# Patient Record
Sex: Male | Born: 1935 | Race: White | Hispanic: No | Marital: Married | Smoking: Former smoker
Health system: Southern US, Community
[De-identification: ages and names within clinical notes are randomized; demographics above are authoritative.]

## PROBLEM LIST (undated history)

## (undated) DIAGNOSIS — I4891 Unspecified atrial fibrillation: Secondary | ICD-10-CM

## (undated) DIAGNOSIS — J189 Pneumonia, unspecified organism: Secondary | ICD-10-CM

## (undated) DIAGNOSIS — I10 Essential (primary) hypertension: Secondary | ICD-10-CM

## (undated) DIAGNOSIS — M1990 Unspecified osteoarthritis, unspecified site: Secondary | ICD-10-CM

## (undated) DIAGNOSIS — E7800 Pure hypercholesterolemia, unspecified: Secondary | ICD-10-CM

## (undated) DIAGNOSIS — R1310 Dysphagia, unspecified: Secondary | ICD-10-CM

## (undated) DIAGNOSIS — I639 Cerebral infarction, unspecified: Secondary | ICD-10-CM

## (undated) DIAGNOSIS — M810 Age-related osteoporosis without current pathological fracture: Secondary | ICD-10-CM

## (undated) DIAGNOSIS — K219 Gastro-esophageal reflux disease without esophagitis: Secondary | ICD-10-CM

## (undated) HISTORY — DX: Cerebral infarction, unspecified: I63.9

---
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
## 1969-05-11 DIAGNOSIS — J189 Pneumonia, unspecified organism: Secondary | ICD-10-CM

## 1969-05-11 DIAGNOSIS — J984 Other disorders of lung: Secondary | ICD-10-CM

## 1969-05-11 HISTORY — DX: Pneumonia, unspecified organism: J18.9

## 1969-05-11 HISTORY — DX: Other disorders of lung: J98.4

## 1999-03-06 ENCOUNTER — Encounter: Admission: RE | Admit: 1999-03-06 | Discharge: 1999-03-06 | Payer: Self-pay | Admitting: Internal Medicine

## 1999-03-06 ENCOUNTER — Encounter: Payer: Self-pay | Admitting: Internal Medicine

## 2000-10-12 ENCOUNTER — Encounter: Payer: Self-pay | Admitting: Internal Medicine

## 2000-10-12 ENCOUNTER — Encounter: Admission: RE | Admit: 2000-10-12 | Discharge: 2000-10-12 | Payer: Self-pay | Admitting: Internal Medicine

## 2012-01-10 DIAGNOSIS — I639 Cerebral infarction, unspecified: Secondary | ICD-10-CM

## 2012-01-10 HISTORY — DX: Cerebral infarction, unspecified: I63.9

## 2012-01-30 ENCOUNTER — Emergency Department (HOSPITAL_COMMUNITY): Payer: Medicare Other

## 2012-01-30 ENCOUNTER — Encounter (HOSPITAL_COMMUNITY): Payer: Self-pay | Admitting: *Deleted

## 2012-01-30 ENCOUNTER — Inpatient Hospital Stay (HOSPITAL_COMMUNITY): Payer: Medicare Other

## 2012-01-30 ENCOUNTER — Inpatient Hospital Stay (HOSPITAL_COMMUNITY)
Admission: EM | Admit: 2012-01-30 | Discharge: 2012-02-02 | DRG: 065 | Disposition: A | Discharge status: Home Health | Payer: Medicare Other | Attending: Internal Medicine | Admitting: Internal Medicine

## 2012-01-30 DIAGNOSIS — Z79899 Other long term (current) drug therapy: Secondary | ICD-10-CM

## 2012-01-30 DIAGNOSIS — R471 Dysarthria and anarthria: Secondary | ICD-10-CM | POA: Diagnosis present

## 2012-01-30 DIAGNOSIS — W19XXXA Unspecified fall, initial encounter: Secondary | ICD-10-CM

## 2012-01-30 DIAGNOSIS — K219 Gastro-esophageal reflux disease without esophagitis: Secondary | ICD-10-CM | POA: Diagnosis present

## 2012-01-30 DIAGNOSIS — M810 Age-related osteoporosis without current pathological fracture: Secondary | ICD-10-CM | POA: Diagnosis present

## 2012-01-30 DIAGNOSIS — I639 Cerebral infarction, unspecified: Secondary | ICD-10-CM | POA: Diagnosis present

## 2012-01-30 DIAGNOSIS — I10 Essential (primary) hypertension: Secondary | ICD-10-CM

## 2012-01-30 DIAGNOSIS — E871 Hypo-osmolality and hyponatremia: Secondary | ICD-10-CM | POA: Diagnosis present

## 2012-01-30 DIAGNOSIS — I4729 Other ventricular tachycardia: Secondary | ICD-10-CM | POA: Diagnosis not present

## 2012-01-30 DIAGNOSIS — I472 Ventricular tachycardia, unspecified: Secondary | ICD-10-CM | POA: Diagnosis not present

## 2012-01-30 DIAGNOSIS — I6330 Cerebral infarction due to thrombosis of unspecified cerebral artery: Principal | ICD-10-CM | POA: Diagnosis present

## 2012-01-30 DIAGNOSIS — R4789 Other speech disturbances: Secondary | ICD-10-CM

## 2012-01-30 DIAGNOSIS — Z87891 Personal history of nicotine dependence: Secondary | ICD-10-CM

## 2012-01-30 DIAGNOSIS — I6350 Cerebral infarction due to unspecified occlusion or stenosis of unspecified cerebral artery: Secondary | ICD-10-CM

## 2012-01-30 DIAGNOSIS — R29898 Other symptoms and signs involving the musculoskeletal system: Secondary | ICD-10-CM | POA: Diagnosis present

## 2012-01-30 DIAGNOSIS — R4781 Slurred speech: Secondary | ICD-10-CM | POA: Diagnosis present

## 2012-01-30 DIAGNOSIS — Z7902 Long term (current) use of antithrombotics/antiplatelets: Secondary | ICD-10-CM

## 2012-01-30 HISTORY — DX: Age-related osteoporosis without current pathological fracture: M81.0

## 2012-01-30 HISTORY — DX: Gastro-esophageal reflux disease without esophagitis: K21.9

## 2012-01-30 HISTORY — DX: Essential (primary) hypertension: I10

## 2012-01-30 LAB — CBC WITH DIFFERENTIAL/PLATELET
Basophils Absolute: 0 10*3/uL (ref 0.0–0.1)
Basophils Relative: 1 % (ref 0–1)
Eosinophils Absolute: 0.1 10*3/uL (ref 0.0–0.7)
Eosinophils Relative: 1 % (ref 0–5)
HCT: 38.8 % — ABNORMAL LOW (ref 39.0–52.0)
Hemoglobin: 13.7 g/dL (ref 13.0–17.0)
Lymphocytes Relative: 28 % (ref 12–46)
Lymphs Abs: 1.7 10*3/uL (ref 0.7–4.0)
MCH: 32.2 pg (ref 26.0–34.0)
MCHC: 35.3 g/dL (ref 30.0–36.0)
MCV: 91.3 fL (ref 78.0–100.0)
Monocytes Absolute: 0.5 10*3/uL (ref 0.1–1.0)
Monocytes Relative: 8 % (ref 3–12)
Neutro Abs: 3.7 10*3/uL (ref 1.7–7.7)
Neutrophils Relative %: 62 % (ref 43–77)
Platelets: 230 10*3/uL (ref 150–400)
RBC: 4.25 MIL/uL (ref 4.22–5.81)
RDW: 12.8 % (ref 11.5–15.5)
WBC: 6 10*3/uL (ref 4.0–10.5)

## 2012-01-30 LAB — CREATININE, SERUM
Creatinine, Ser: 0.85 mg/dL (ref 0.50–1.35)
GFR calc Af Amer: 88 mL/min — ABNORMAL LOW (ref >90)
GFR calc non Af Amer: 76 mL/min — ABNORMAL LOW (ref >90)

## 2012-01-30 LAB — URINALYSIS, ROUTINE W REFLEX MICROSCOPIC
Ketones, ur: NEGATIVE mg/dL
Leukocytes, UA: NEGATIVE
Nitrite: NEGATIVE
Specific Gravity, Urine: 1.012 (ref 1.005–1.030)
pH: 7.5 (ref 5.0–8.0)

## 2012-01-30 LAB — CBC
HCT: 38.9 % — ABNORMAL LOW (ref 39.0–52.0)
Hemoglobin: 14.2 g/dL (ref 13.0–17.0)
MCH: 33.1 pg (ref 26.0–34.0)
MCHC: 36.5 g/dL — ABNORMAL HIGH (ref 30.0–36.0)
MCV: 90.7 fL (ref 78.0–100.0)
Platelets: 227 10*3/uL (ref 150–400)
RBC: 4.29 MIL/uL (ref 4.22–5.81)
RDW: 12.7 % (ref 11.5–15.5)
WBC: 7.1 10*3/uL (ref 4.0–10.5)

## 2012-01-30 LAB — COMPREHENSIVE METABOLIC PANEL
ALT: 13 U/L (ref 0–53)
AST: 23 U/L (ref 0–37)
Albumin: 4.3 g/dL (ref 3.5–5.2)
Alkaline Phosphatase: 44 U/L (ref 39–117)
BUN: 14 mg/dL (ref 6–23)
CO2: 27 mEq/L (ref 19–32)
Calcium: 10 mg/dL (ref 8.4–10.5)
Chloride: 96 mEq/L (ref 96–112)
Creatinine, Ser: 0.85 mg/dL (ref 0.50–1.35)
GFR calc Af Amer: 88 mL/min — ABNORMAL LOW (ref >90)
GFR calc non Af Amer: 76 mL/min — ABNORMAL LOW (ref >90)
Glucose, Bld: 112 mg/dL — ABNORMAL HIGH (ref 70–99)
Potassium: 4 mEq/L (ref 3.5–5.1)
Sodium: 134 mEq/L — ABNORMAL LOW (ref 135–145)
Total Bilirubin: 0.5 mg/dL (ref 0.3–1.2)
Total Protein: 7.2 g/dL (ref 6.0–8.3)

## 2012-01-30 LAB — GLUCOSE, CAPILLARY: Glucose-Capillary: 108 mg/dL — ABNORMAL HIGH (ref 70–99)

## 2012-01-30 LAB — TROPONIN I: Troponin I: <0.3 ng/mL (ref <0.30)

## 2012-01-30 MED ORDER — SENNOSIDES-DOCUSATE SODIUM 8.6-50 MG PO TABS: 1.0000 | ORAL_TABLET | Freq: Every evening | ORAL | Status: DC | PRN

## 2012-01-30 MED ORDER — ASPIRIN 300 MG RE SUPP
300.0000 mg | Freq: Every day | RECTAL | Status: DC
  Administered 2012-01-30: 300 mg via RECTAL
  Filled 2012-01-30 (×2): qty 1

## 2012-01-30 MED ORDER — ONDANSETRON HCL 4 MG/2ML IJ SOLN: 4.0000 mg | Freq: Three times a day (TID) | INTRAMUSCULAR | Status: AC | PRN

## 2012-01-30 MED ORDER — SODIUM CHLORIDE 0.9 % IV SOLN
INTRAVENOUS | Status: AC
  Administered 2012-01-30: 21:00:00 via INTRAVENOUS

## 2012-01-30 MED ORDER — ENOXAPARIN SODIUM 40 MG/0.4ML ~~LOC~~ SOLN
40.0000 mg | SUBCUTANEOUS | Status: DC
Start: 2012-01-30 — End: 2012-02-02
  Administered 2012-01-30 – 2012-02-01 (×3): 40 mg via SUBCUTANEOUS
  Filled 2012-01-30 (×4): qty 0.4

## 2012-01-30 MED ORDER — ASPIRIN 325 MG PO TABS
325.0000 mg | ORAL_TABLET | Freq: Every day | ORAL | Status: DC
  Administered 2012-01-31: 325 mg via ORAL
  Filled 2012-01-30: qty 1

## 2012-01-30 NOTE — ED Provider Notes (Signed)
3:41 PM Pt seen and examined by me in CDU. Pt here with complaint a slurred speech. States noticed symptoms about 3 days ago, but worsened today. States "it feels like my tongue is not moving as well." Per wife, speech is slurred, where normally it is clear. Pt states symptoms began after doing an exercise machine for "too long." Pt denies pain anywhere, no headache, no tongue pain, no sore throat. Pt was evaluated and sent to CDU awaiting MRI to r/o acute stroke.   Exam: Pt in NAD. AAOx3. PERRLA. Cranial nerves intact. Tongue appears normal, normal pharynx. Neck is supple. Regular HR and rhythm. Lungs are clear to auscultation bilaterally. Normal coordination. Normal gait. Neurovascularly intact. Speech is slurred.    Will continue to monitor.  Results for orders placed during the hospital encounter of 01/30/12  CBC WITH DIFFERENTIAL      Component Value Range   WBC 6.0  4.0 - 10.5 K/uL   RBC 4.25  4.22 - 5.81 MIL/uL   Hemoglobin 13.7  13.0 - 17.0 g/dL   HCT 52.8 (*) 41.3 - 24.4 %   MCV 91.3  78.0 - 100.0 fL   MCH 32.2  26.0 - 34.0 pg   MCHC 35.3  30.0 - 36.0 g/dL   RDW 01.0  27.2 - 53.6 %   Platelets 230  150 - 400 K/uL   Neutrophils Relative 62  43 - 77 %   Neutro Abs 3.7  1.7 - 7.7 K/uL   Lymphocytes Relative 28  12 - 46 %   Lymphs Abs 1.7  0.7 - 4.0 K/uL   Monocytes Relative 8  3 - 12 %   Monocytes Absolute 0.5  0.1 - 1.0 K/uL   Eosinophils Relative 1  0 - 5 %   Eosinophils Absolute 0.1  0.0 - 0.7 K/uL   Basophils Relative 1  0 - 1 %   Basophils Absolute 0.0  0.0 - 0.1 K/uL  COMPREHENSIVE METABOLIC PANEL      Component Value Range   Sodium 134 (*) 135 - 145 mEq/L   Potassium 4.0  3.5 - 5.1 mEq/L   Chloride 96  96 - 112 mEq/L   CO2 27  19 - 32 mEq/L   Glucose, Bld 112 (*) 70 - 99 mg/dL   BUN 14  6 - 23 mg/dL   Creatinine, Ser 6.44  0.50 - 1.35 mg/dL   Calcium 03.4  8.4 - 74.2 mg/dL   Total Protein 7.2  6.0 - 8.3 g/dL   Albumin 4.3  3.5 - 5.2 g/dL   AST 23  0 - 37 U/L   ALT 13  0 - 53 U/L   Alkaline Phosphatase 44  39 - 117 U/L   Total Bilirubin 0.5  0.3 - 1.2 mg/dL   GFR calc non Af Amer 76 (*) >90 mL/min   GFR calc Af Amer 88 (*) >90 mL/min  GLUCOSE, CAPILLARY      Component Value Range   Glucose-Capillary 108 (*) 70 - 99 mg/dL  URINALYSIS, ROUTINE W REFLEX MICROSCOPIC      Component Value Range   Color, Urine YELLOW  YELLOW   APPearance CLEAR  CLEAR   Specific Gravity, Urine 1.012  1.005 - 1.030   pH 7.5  5.0 - 8.0   Glucose, UA NEGATIVE  NEGATIVE mg/dL   Hgb urine dipstick NEGATIVE  NEGATIVE   Bilirubin Urine NEGATIVE  NEGATIVE   Ketones, ur NEGATIVE  NEGATIVE mg/dL   Protein, ur NEGATIVE  NEGATIVE mg/dL   Urobilinogen, UA 0.2  0.0 - 1.0 mg/dL   Nitrite NEGATIVE  NEGATIVE   Leukocytes, UA NEGATIVE  NEGATIVE   Ct Head Wo Contrast  01/30/2012  *RADIOLOGY REPORT*  Clinical Data: History of fall with slurred speech.  Weakness.  CT HEAD WITHOUT CONTRAST  Technique:  Contiguous axial images were obtained from the base of the skull through the vertex without contrast.  Comparison: No priors.  Findings: Moderate cerebral and cerebellar atrophy is age appropriate.  There are patchy and confluent areas of decreased attenuation throughout the deep and periventricular white matter of the cerebral hemispheres bilaterally, compatible with chronic microvascular ischemic changes.  No definite acute intracranial abnormality.  Specifically, no definite evidence of acute/subacute cerebral ischemia, no focal intracerebral hemorrhage, no mass, mass effect, hydrocephalus or abnormal intra or extra-axial fluid collections.  Small mucosal retention cysts or polyps are noted in the maxillary sinuses bilaterally.  The remaining portions of the visualized paranasal sinuses and mastoids are otherwise well pneumatized.  IMPRESSION: 1.  No acute intracranial abnormalities. 2.  Moderate cerebral and cerebellar atrophy with mild chronic microvascular ischemic changes in the deep and  periventricular white matter of the cerebral hemispheres bilaterally. 3. Multiple small mucosal retention cysts or polyps in the maxillary sinuses bilaterally.   Original Report Authenticated By: Florencia Reasons, M.D.    Mr Maxine Glenn Head Wo Contrast  01/30/2012  *RADIOLOGY REPORT*  Clinical Data:  Slurred speech.  MRI BRAIN WITHOUT CONTRAST MRA HEAD WITHOUT CONTRAST  Technique: Multiplanar, multiecho pulse sequences of the brain and surrounding structures were obtained according to standard protocol without intravenous contrast.  Angiographic images of the head were obtained using MRA technique without contrast.  Comparison: 01/30/2012 head CT.  MRI HEAD  Findings:   Small acute non hemorrhagic infarct extends from the superior margin of the left lenticular nucleus into the posterior left corona radiata.  No intracranial hemorrhage.  Small vessel disease type changes.  Global atrophy without hydrocephalus.  No intracranial mass lesion detected on this unenhanced exam.  Partial opacification left mastoid air cells.  Polypoid opacification right maxillary sinus.  Minimal mucosal thickening remainder paranasal sinuses.  Mild transverse ligament hypertrophy.  IMPRESSION:  Small acute non hemorrhagic infarct extends from the superior margin of the left lenticular nucleus into the posterior left corona radiata.  Please see above.  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 Moderate right middle cerebral artery branch vessel narrowing irregularity.  Ectatic left internal carotid artery.  Fetal type origin left posterior cerebral artery.  Ectatic vertebral arteries and basilar artery without high-grade stenosis.  Nonvisualization right PICA and both AICAs.  Moderate stenosis superior cerebral artery bilaterally.  Moderate tandem stenoses left posterior cerebral artery.  Mild irregularity right posterior cerebral artery branches.  Minimally bulbous appearance of the basilar tip may be related to  the fetal type origin of the left posterior cerebral artery with tiny aneurysm a secondary consideration.  Stability can be confirmed on follow-up.  IMPRESSION: Intracranial atherosclerotic type changes as noted above.  Minimally bulbous appearance of the basilar tip may be related to the fetal type origin of the left posterior cerebral artery with tiny aneurysm a secondary consideration.  Stability can be confirmed on follow-up  Critical Value/emergent results were called by telephone at the time of interpretation on 01/30/2012 at 5:30 p.m. to Dr. Adriana Simas, who verbally acknowledged these results.   Original Report Authenticated By: Fuller Canada, M.D.    Mr Brain Ilda Basset  Contrast  01/30/2012  *RADIOLOGY REPORT*  Clinical Data:  Slurred speech.  MRI BRAIN WITHOUT CONTRAST MRA HEAD WITHOUT CONTRAST  Technique: Multiplanar, multiecho pulse sequences of the brain and surrounding structures were obtained according to standard protocol without intravenous contrast.  Angiographic images of the head were obtained using MRA technique without contrast.  Comparison: 01/30/2012 head CT.  MRI HEAD  Findings:   Small acute non hemorrhagic infarct extends from the superior margin of the left lenticular nucleus into the posterior left corona radiata.  No intracranial hemorrhage.  Small vessel disease type changes.  Global atrophy without hydrocephalus.  No intracranial mass lesion detected on this unenhanced exam.  Partial opacification left mastoid air cells.  Polypoid opacification right maxillary sinus.  Minimal mucosal thickening remainder paranasal sinuses.  Mild transverse ligament hypertrophy.  IMPRESSION:  Small acute non hemorrhagic infarct extends from the superior margin of the left lenticular nucleus into the posterior left corona radiata.  Please see above.  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 Moderate right middle cerebral artery branch vessel narrowing irregularity.  Ectatic left  internal carotid artery.  Fetal type origin left posterior cerebral artery.  Ectatic vertebral arteries and basilar artery without high-grade stenosis.  Nonvisualization right PICA and both AICAs.  Moderate stenosis superior cerebral artery bilaterally.  Moderate tandem stenoses left posterior cerebral artery.  Mild irregularity right posterior cerebral artery branches.  Minimally bulbous appearance of the basilar tip may be related to the fetal type origin of the left posterior cerebral artery with tiny aneurysm a secondary consideration.  Stability can be confirmed on follow-up.  IMPRESSION: Intracranial atherosclerotic type changes as noted above.  Minimally bulbous appearance of the basilar tip may be related to the fetal type origin of the left posterior cerebral artery with tiny aneurysm a secondary consideration.  Stability can be confirmed on follow-up  Critical Value/emergent results were called by telephone at the time of interpretation on 01/30/2012 at 5:30 p.m. to Dr. Adriana Simas, who verbally acknowledged these results.   Original Report Authenticated By: Fuller Canada, M.D.     6:44 PM MR positive for acute stroke. Pt informed of results. Will admit to [facility]. Spoke with Dr. Roseanne Reno, neurology, will consult.    Lottie Mussel, PA 01/30/12 1845

## 2012-01-30 NOTE — ED Notes (Signed)
Report called to [facility] in CDU

## 2012-01-30 NOTE — ED Notes (Signed)
Patient with reported slurred speech for 3 to 4 days, patient has also had fall x 2.  Patient has complaints of right sided weakness, notes diff eating and the right leg is heavy.  Patient is alert and oriented.

## 2012-01-30 NOTE — ED Notes (Signed)
Wife states that she noticed that patient's speech has been a "little off" x 3-4 days. She also states that he falls asleep easily

## 2012-01-30 NOTE — Consult Note (Signed)
Reason for Consult: Dysarthria, falls Referring Physician: Lottie Mussel, PA  CC: Dysarthria  History is obtained from: Patient  HPI: Mark Joseph is an 78 y.o. male who is than having slurred speech and falls for the last 2 days. He states that started suddenly, and has been persistent. He finally came in because was not getting better. He denies any recent history of infections.   ROS: An 11 point ROS was performed and is negative except as noted in the HPI.  Past Medical History  Diagnosis Date  . Hypertension   . GERD (gastroesophageal reflux disease)   . Osteoporosis     Family History: No history of stroke  Social History: Tob: Has not smoked in 40 years, but did for 15 years prior to that  Exam: Current vital signs: BP 150/74  Pulse 72  Temp 98.2 F (36.8 C) (Oral)  Resp 18  Ht 5\' 11"  (1.803 m)  Wt 70.308 kg (155 lb)  BMI 21.62 kg/m2  SpO2 99% Vital signs in last 24 hours: Temp:  [98.1 F (36.7 C)-98.9 F (37.2 C)] 98.2 F (36.8 C) (09/21 2206) Pulse Rate:  [65-90] 72  (09/21 2206) Resp:  [14-24] 18  (09/21 2206) BP: (148-183)/(68-114) 150/74 mmHg (09/21 2206) SpO2:  [94 %-100 %] 99 % (09/21 2206) Weight:  [70.308 kg (155 lb)] 70.308 kg (155 lb) (09/21 1941)  General: In bed, no apparent distress CV: Regular rate and rhythm Mental Status: Patient is awake, alert, oriented to person, place, month, year, and situation. Patient is able to give a clear and coherent history. Speech is fluent and language is appropriate Cranial Nerves: II: Visual Fields are full. Pupils are equal, round, and reactive to light.  Discs are difficult to visualize. III,IV, VI: EOMI without ptosis or diploplia.  V,VII: Facial sensation and movement are symmetric.  VIII: hearing is intact to voice X: Uvula elevates symmetrically XI: Shoulder shrug is symmetric. XII: tongue is midline without atrophy or fasciculations.  Motor: Tone is normal. Bulk is normal. 5/5 strength  was present in all four extremities to confrontation, but he does have a very mild right pronator drift in the arm Sensory: Sensation is symmetric to light touch and temperature in the arms and legs. Deep Tendon Reflexes: 2+ and symmetric in the biceps and patellae.  Plantars: Toes are downgoing bilaterally.  Cerebellar: FNF intact bilaterally Gait: Patient has had recent stroke and therefore is kept lying down to facilitate cerebral perfusion  I have reviewed labs in epic and the results pertinent to this consultation are: CBC unremarkable BMP unremarkable  I have reviewed the images obtained: MRI brain-small lacunar infarct, likely small vessel disease  Impression: 78 year old male with a history of 4 days slurred speech and difficulty with walking who has a small lacunar infarct. He has been admitted for stroke workup.  Recommendations: 1. HgbA1c, fasting lipid panel 2. PT consult, OT consult, Speech consult 3. Echocardiogram 4. Carotid dopplers 5. Prophylactic therapy-Antiplatelet med: Aspirin - dose 300 pr 6. Risk factor modification 7. Telemetry monitoring 8. Frequent neuro checks  Ritta Slot, MD [facility] [phone]

## 2012-01-30 NOTE — ED Provider Notes (Signed)
History     CSN: [id number]  Arrival date & time 01/30/12  1137   First MD Initiated Contact with Patient 01/30/12 1237      Chief Complaint  Patient presents with  . Aphasia  . Weakness    (Consider location/radiation/quality/duration/timing/severity/associated sxs/prior treatment) Patient is a 78 y.o. male presenting with weakness. The history is provided by the patient.  Weakness  Additional symptoms include weakness.   patient here with slurred speech which is been constant for the past 4 days. Notes some trouble with his gait as well. Denies any confusion, headache, neck pain, vomiting. No chest or abdominal pain. Has been using a walker recently. Called his physician today and was told to come in for evaluation. Nothing makes her symptoms better or worse. No recent history of fever or infections. Denies any cough or shortness of breath.  Past Medical History  Diagnosis Date  . Hypertension   . GERD (gastroesophageal reflux disease)   . Osteoporosis     Past Surgical History  Procedure Date  . Appendectomy     No family history on file.  History  Substance Use Topics  . Smoking status: Never Smoker   . Smokeless tobacco: Not on file  . Alcohol Use: Yes      Review of Systems  Neurological: Positive for weakness.  All other systems reviewed and are negative.    Allergies  Review of patient's allergies indicates no known allergies.  Home Medications   Current Outpatient Rx  Name Route Sig Dispense Refill  . ALENDRONATE SODIUM 70 MG PO TABS Oral Take 70 mg by mouth every 7 (seven) days. Take on Sundays. Take with a full glass of water on an empty stomach.    . ASPIRIN EC 81 MG PO TBEC Oral Take 81 mg by mouth daily.    Marland Kitchen CALCIUM PO Oral Take 1 tablet by mouth 2 (two) times daily.    Marland Kitchen LISINOPRIL-HYDROCHLOROTHIAZIDE 20-25 MG PO TABS Oral Take 1 tablet by mouth daily.    Marland Kitchen PANTOPRAZOLE SODIUM 40 MG PO TBEC Oral Take 40 mg by mouth daily.      BP  159/114  Pulse 90  Temp 98.4 F (36.9 C) (Oral)  Resp 20  Ht 5\' 11"  (1.803 m)  Wt 155 lb (70.308 kg)  BMI 21.62 kg/m2  SpO2 95%  Physical Exam  Nursing note and vitals reviewed. Constitutional: He is oriented to person, place, and time. He appears well-developed and well-nourished.  Non-toxic appearance. No distress.  HENT:  Head: Normocephalic and atraumatic.  Eyes: Conjunctivae normal, EOM and lids are normal. Pupils are equal, round, and reactive to light.  Neck: Normal range of motion. Neck supple. No tracheal deviation present. No mass present.  Cardiovascular: Normal rate, regular rhythm and normal heart sounds.  Exam reveals no gallop.   No murmur heard. Pulmonary/Chest: Effort normal and breath sounds normal. No stridor. No respiratory distress. He has no decreased breath sounds. He has no wheezes. He has no rhonchi. He has no rales.  Abdominal: Soft. Normal appearance and bowel sounds are normal. He exhibits no distension. There is no tenderness. There is no rebound and no CVA tenderness.  Musculoskeletal: Normal range of motion. He exhibits no edema and no tenderness.  Neurological: He is alert and oriented to person, place, and time. He has normal strength. No cranial nerve deficit or sensory deficit. GCS eye subscore is 4. GCS verbal subscore is 5. GCS motor subscore is 6.  No dysarthia or slurred speech  Skin: Skin is warm and dry. No abrasion and no rash noted.  Psychiatric: He has a normal mood and affect. His speech is normal and behavior is normal.    ED Course  Procedures (including critical care time)  Labs Reviewed  GLUCOSE, CAPILLARY - Abnormal; Notable for the following:    Glucose-Capillary 108 (*)     All other components within normal limits  CBC WITH DIFFERENTIAL  COMPREHENSIVE METABOLIC PANEL  URINALYSIS, ROUTINE W REFLEX MICROSCOPIC  URINE CULTURE   No results found.   No diagnosis found.    MDM  Pt to have brain MRI to rule out  cerebellar infarct. Patient does not have any slurred speech here and is without focal neurological findings. Suspect the patient will be able to be discharged pending a negative MRI of his brain        Toy Baker, MD 01/30/12 1349

## 2012-01-30 NOTE — H&P (Signed)
[facility] History and Physical  Mark Joseph ZOX:[id number] DOB: 04-Mar-1922 DOA: 01/30/2012  PCP: Kirby Funk  Chief Complaint: SLURRED SPEECH FOR 3 DAYS AND MULTIPLE FALLS.  HPI: Mark Joseph is a 78 y.o. male h/o hypertension and on aspirin came in for persistent slurred speech and multiple falls over the last few days. On arrival to ED he was worked up for CVA, with an MRI OF THE BRAIN, he was found to have an acute small non hemorrhagic left sided cva. He is being admitted to hospitalist service for management of acute CVA.   Review of Systems: The patient denies anorexia, fever, weight loss,, vision loss,  hoarseness, chest pain, syncope, dyspnea on exertion, peripheral edema, balance deficits, hemoptysis, abdominal pain, melena, hematochezia, severe indigestion/heartburn, hematuria, incontinence, genital sores, muscle weakness, suspicious skin lesions, transient blindness, difficulty walking, depression, unusual weight change, abnormal bleeding, enlarged lymph nodes, angioedema, and breast masses.   Past Medical History  Diagnosis Date  . Hypertension   . GERD (gastroesophageal reflux disease)   . Osteoporosis    Past Surgical History  Procedure Date  . Appendectomy    Social History:  reports that he has never smoked. He does not have any smokeless tobacco history on file. He reports that he drinks alcohol. He reports that he does not use illicit drugs.  No Known Allergies  No family history on file.   Prior to Admission medications   Medication Sig Start Date End Date Taking? Authorizing Provider  alendronate (FOSAMAX) 70 MG tablet Take 70 mg by mouth every 7 (seven) days. Take on Sundays. Take with a full glass of water on an empty stomach.   Yes Historical Provider, MD  aspirin EC 81 MG tablet Take 81 mg by mouth daily.   Yes Historical Provider, MD  CALCIUM PO Take 1 tablet by mouth 2 (two) times daily.   Yes Historical Provider, MD  lisinopril-hydrochlorothiazide  (PRINZIDE,ZESTORETIC) 20-25 MG per tablet Take 1 tablet by mouth daily.   Yes Historical Provider, MD  pantoprazole (PROTONIX) 40 MG tablet Take 40 mg by mouth daily.   Yes Historical Provider, MD   Physical Exam: Filed Vitals:   01/30/12 1500 01/30/12 1520 01/30/12 1808 01/30/12 1814  BP: 156/95 181/86  183/102  Pulse: 65     Temp:   98.9 F (37.2 C) 98.9 F (37.2 C)  TempSrc:    Oral  Resp: 18 18  16   Height:      Weight:      SpO2: 99% 98%  97%    Constitutional: Vital signs reviewed.  Patient is a well-developed and well-nourished  in no acute distress and cooperative with exam. Alert and oriented x3.  Head: Normocephalic and atraumatic Mouth: no erythema or exudates, MMM Eyes: PERRL, EOMI, conjunctivae normal, No scleral icterus.  Neck: Supple, Trachea midline normal ROM, No JVD, mass, thyromegaly, or carotid bruit present.  Cardiovascular: RRR, S1 normal, S2 normal, no MRG, pulses symmetric and intact bilaterally Pulmonary/Chest: CTAB, no wheezes, rales, or rhonchi Abdominal: Soft. Non-tender, non-distended, bowel sounds are normal, no masses, organomegaly, or guarding present.  Musculoskeletal: No joint deformities, erythema, or stiffness, ROM full and no nontender Hematology: no cervical, inginal, or axillary adenopathy.  Neurological: A&O x3, THICK SPEECH, Strength is normal and symmetric bilaterally, cranial nerve II-XII are grossly intact, no focal motor deficit, sensory intact to light touch bilaterally.  Skin: Warm, dry and intact. No rash, cyanosis, or clubbing.  Psychiatric: Normal mood and affect.   Labs on  Admission:  Basic Metabolic Panel:  Lab 01/30/12 1610  NA 134*  K 4.0  CL 96  CO2 27  GLUCOSE 112*  BUN 14  CREATININE 0.85  CALCIUM 10.0  MG --  PHOS --   Liver Function Tests:  Lab 01/30/12 1153  AST 23  ALT 13  ALKPHOS 44  BILITOT 0.5  PROT 7.2  ALBUMIN 4.3   No results found for this basename: LIPASE:5,AMYLASE:5 in the last 168  hours No results found for this basename: AMMONIA:5 in the last 168 hours CBC:  Lab 01/30/12 1153  WBC 6.0  NEUTROABS 3.7  HGB 13.7  HCT 38.8*  MCV 91.3  PLT 230   Cardiac Enzymes: No results found for this basename: CKTOTAL:5,CKMB:5,CKMBINDEX:5,TROPONINI:5 in the last 168 hours  BNP (last 3 results) No results found for this basename: PROBNP:3 in the last 8760 hours CBG:  Lab 01/30/12 1206  GLUCAP 108*    Radiological Exams on Admission: Ct Head Wo Contrast  01/30/2012  *RADIOLOGY REPORT*  Clinical Data: History of fall with slurred speech.  Weakness.  CT HEAD WITHOUT CONTRAST  Technique:  Contiguous axial images were obtained from the base of the skull through the vertex without contrast.  Comparison: No priors.  Findings: Moderate cerebral and cerebellar atrophy is age appropriate.  There are patchy and confluent areas of decreased attenuation throughout the deep and periventricular white matter of the cerebral hemispheres bilaterally, compatible with chronic microvascular ischemic changes.  No definite acute intracranial abnormality.  Specifically, no definite evidence of acute/subacute cerebral ischemia, no focal intracerebral hemorrhage, no mass, mass effect, hydrocephalus or abnormal intra or extra-axial fluid collections.  Small mucosal retention cysts or polyps are noted in the maxillary sinuses bilaterally.  The remaining portions of the visualized paranasal sinuses and mastoids are otherwise well pneumatized.  IMPRESSION: 1.  No acute intracranial abnormalities. 2.  Moderate cerebral and cerebellar atrophy with mild chronic microvascular ischemic changes in the deep and periventricular white matter of the cerebral hemispheres bilaterally. 3. Multiple small mucosal retention cysts or polyps in the maxillary sinuses bilaterally.   Original Report Authenticated By: Florencia Reasons, M.D.    Mr Maxine Glenn Head Wo Contrast  01/30/2012  *RADIOLOGY REPORT*  Clinical Data:  Slurred speech.   MRI BRAIN WITHOUT CONTRAST MRA HEAD WITHOUT CONTRAST  Technique: Multiplanar, multiecho pulse sequences of the brain and surrounding structures were obtained according to standard protocol without intravenous contrast.  Angiographic images of the head were obtained using MRA technique without contrast.  Comparison: 01/30/2012 head CT.  MRI HEAD  Findings:   Small acute non hemorrhagic infarct extends from the superior margin of the left lenticular nucleus into the posterior left corona radiata.  No intracranial hemorrhage.  Small vessel disease type changes.  Global atrophy without hydrocephalus.  No intracranial mass lesion detected on this unenhanced exam.  Partial opacification left mastoid air cells.  Polypoid opacification right maxillary sinus.  Minimal mucosal thickening remainder paranasal sinuses.  Mild transverse ligament hypertrophy.  IMPRESSION:  Small acute non hemorrhagic infarct extends from the superior margin of the left lenticular nucleus into the posterior left corona radiata.  Please see above.  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 Moderate right middle cerebral artery branch vessel narrowing irregularity.  Ectatic left internal carotid artery.  Fetal type origin left posterior cerebral artery.  Ectatic vertebral arteries and basilar artery without high-grade stenosis.  Nonvisualization right PICA and both AICAs.  Moderate stenosis superior cerebral artery bilaterally.  Moderate tandem stenoses left posterior cerebral artery.  Mild irregularity right posterior cerebral artery branches.  Minimally bulbous appearance of the basilar tip may be related to the fetal type origin of the left posterior cerebral artery with tiny aneurysm a secondary consideration.  Stability can be confirmed on follow-up.  IMPRESSION: Intracranial atherosclerotic type changes as noted above.  Minimally bulbous appearance of the basilar tip may be related to the fetal type origin of the  left posterior cerebral artery with tiny aneurysm a secondary consideration.  Stability can be confirmed on follow-up  Critical Value/emergent results were called by telephone at the time of interpretation on 01/30/2012 at 5:30 p.m. to Dr. Adriana Simas, who verbally acknowledged these results.   Original Report Authenticated By: Fuller Canada, M.D.    Mr Brain Wo Contrast  01/30/2012  *RADIOLOGY REPORT*  Clinical Data:  Slurred speech.  MRI BRAIN WITHOUT CONTRAST MRA HEAD WITHOUT CONTRAST  Technique: Multiplanar, multiecho pulse sequences of the brain and surrounding structures were obtained according to standard protocol without intravenous contrast.  Angiographic images of the head were obtained using MRA technique without contrast.  Comparison: 01/30/2012 head CT.  MRI HEAD  Findings:   Small acute non hemorrhagic infarct extends from the superior margin of the left lenticular nucleus into the posterior left corona radiata.  No intracranial hemorrhage.  Small vessel disease type changes.  Global atrophy without hydrocephalus.  No intracranial mass lesion detected on this unenhanced exam.  Partial opacification left mastoid air cells.  Polypoid opacification right maxillary sinus.  Minimal mucosal thickening remainder paranasal sinuses.  Mild transverse ligament hypertrophy.  IMPRESSION:  Small acute non hemorrhagic infarct extends from the superior margin of the left lenticular nucleus into the posterior left corona radiata.  Please see above.  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 Moderate right middle cerebral artery branch vessel narrowing irregularity.  Ectatic left internal carotid artery.  Fetal type origin left posterior cerebral artery.  Ectatic vertebral arteries and basilar artery without high-grade stenosis.  Nonvisualization right PICA and both AICAs.  Moderate stenosis superior cerebral artery bilaterally.  Moderate tandem stenoses left posterior cerebral artery.  Mild  irregularity right posterior cerebral artery branches.  Minimally bulbous appearance of the basilar tip may be related to the fetal type origin of the left posterior cerebral artery with tiny aneurysm a secondary consideration.  Stability can be confirmed on follow-up.  IMPRESSION: Intracranial atherosclerotic type changes as noted above.  Minimally bulbous appearance of the basilar tip may be related to the fetal type origin of the left posterior cerebral artery with tiny aneurysm a secondary consideration.  Stability can be confirmed on follow-up  Critical Value/emergent results were called by telephone at the time of interpretation on 01/30/2012 at 5:30 p.m. to Dr. Adriana Simas, who verbally acknowledged these results.   Original Report Authenticated By: Fuller Canada, M.D.     EKG: sinus with multiple PAC'S and RBBB.  Assessment/Plan Active Problems:  Slurred speech  CVA (cerebral infarction)  Hypertension   1. Acute  LEFT SIDED CVA:  - Pt is out of TPA window - admit to tele - stroke work up in progress including echo, carotid duplex, lipid panel and hgba1c. - bed side swallow eval passed and start low sodium diet - neurology consulted by ED. - continue with aspirin 325mg  daily.  2.hypertension: permissive hypertension  3. DVT prophylaxis ; lovenox. 4. Mild hyponatremia: continue to monitor.     Code Status: full code Family Communication:  discussed with wife at bedside Disposition Plan: pending PT/OT EVAL  Time spent: 96 MIN  Shevawn Langenberg [facility] Pager [phone]  If 7PM-7AM, please contact night-coverage [email or web address] Password [facility] 01/30/2012, 6:49 PM

## 2012-01-31 LAB — TROPONIN I: Troponin I: <0.3 ng/mL (ref <0.30)

## 2012-01-31 LAB — HEMOGLOBIN A1C
Hgb A1c MFr Bld: 5.6 % (ref <5.7)
Mean Plasma Glucose: 114 mg/dL (ref <117)

## 2012-01-31 LAB — URINE CULTURE

## 2012-01-31 LAB — LIPID PANEL
Cholesterol: 185 mg/dL (ref 0–200)
LDL Cholesterol: 104 mg/dL — ABNORMAL HIGH (ref 0–99)
Total CHOL/HDL Ratio: 2.8 RATIO
VLDL: 14 mg/dL (ref 0–40)

## 2012-01-31 MED ORDER — CLOPIDOGREL BISULFATE 75 MG PO TABS
75.0000 mg | ORAL_TABLET | Freq: Every day | ORAL | Status: DC
  Administered 2012-02-01 – 2012-02-02 (×2): 75 mg via ORAL
  Filled 2012-01-31 (×4): qty 1

## 2012-01-31 NOTE — Progress Notes (Signed)
  Echocardiogram 2D Echocardiogram has been performed.  Mauri Tolen 01/31/2012, 10:24 AM

## 2012-01-31 NOTE — Progress Notes (Signed)
History: Mark Joseph is an 78 y.o. male who is than having slurred speech and falls for the last 2 days. He states that started suddenly, and has been persistent. He finally came in because was not getting better. He denies any recent history of infections.   LSN:<01/28/12 tPA Given: out of window  Subjective: Still with slurred speech, no difficulty swallowing.  No HA or VA change.  Objective: BP 155/91  Pulse 71  Temp 98.1 F (36.7 C) (Oral)  Resp 20  Ht 5\' 11"  (1.803 m)  Wt 70.308 kg (155 lb)  BMI 21.62 kg/m2  SpO2 95%  CBGs  Basename 01/30/12 1206  GLUCAP 108*   Diet: heart healthy, thin  Activity:up with assist   DVT Prophylaxis: lovenox   Medications: Scheduled:   . sodium chloride   Intravenous STAT  . aspirin  300 mg Rectal Daily   Or  . aspirin  325 mg Oral Daily  . enoxaparin  40 mg Subcutaneous Q24H    Neurologic Exam: Mental Status: Alert, oriented, thought content appropriate.  Speech fluent without evidence of aphasia, mild dysarthria present.. Able to follow 3 step commands without difficulty. Cranial Nerves: II- Visual fields grossly intact. III/IV/VI-Extraocular movements intact.  Pupils reactive bilaterally. V/VII-Smile symmetric VIII-hearing grossly intact IX/X-normal gag XI-bilateral shoulder shrug XII-midline tongue extension Motor: 5/5 bilaterally with normal tone and bulk- slightly weaker grip on right. Sensory: Light touch intact throughout, bilaterally Deep Tendon Reflexes: 2+ and symmetric throughout Plantars: flexor at rest. Cerebellar: impaired fine finger movements on right.   Lab Results: Basic Metabolic Panel:  Lab 01/30/12 1610 01/30/12 2034 01/30/12 1153  NA -- -- 134*  K -- -- 4.0  CL -- -- 96  CO2 -- -- 27  GLUCOSE -- -- 112*  BUN -- -- 14  CREATININE -- 0.85 0.85  CALCIUM -- -- 10.0  MG 1.9 -- --  PHOS -- -- --   Liver Function Tests:  Lab 01/30/12 1153  AST 23  ALT 13  ALKPHOS 44  BILITOT 0.5  PROT  7.2  ALBUMIN 4.3   CBC:  Lab 01/30/12 2034 01/30/12 1153  WBC 7.1 6.0  NEUTROABS -- 3.7  HGB 14.2 13.7  HCT 38.9* 38.8*  MCV 90.7 91.3  PLT 227 230   CBG:  Lab 01/30/12 1206  GLUCAP 108*   Fasting Lipid Panel:  Lab 01/31/12 0313  CHOL 185  HDL 67  LDLCALC 104*  TRIG 72  CHOLHDL 2.8  LDLDIRECT --   Urinalysis:  Lab 01/30/12 1547  COLORURINE YELLOW  LABSPEC 1.012  PHURINE 7.5  GLUCOSEU NEGATIVE  HGBUR NEGATIVE  BILIRUBINUR NEGATIVE  KETONESUR NEGATIVE  PROTEINUR NEGATIVE  UROBILINOGEN 0.2  NITRITE NEGATIVE  LEUKOCYTESUR NEGATIVE   Misc. Labs  Study Results:  01/30/2012  CT HEAD WITHOUT CONTRAST  Findings: Moderate cerebral and cerebellar atrophy is age appropriate.  There are patchy and confluent areas of decreased attenuation throughout the deep and periventricular white matter of the cerebral hemispheres bilaterally, compatible with chronic microvascular ischemic changes.  No definite acute intracranial abnormality.  Specifically, no definite evidence of acute/subacute cerebral ischemia, no focal intracerebral hemorrhage, no mass, mass effect, hydrocephalus or abnormal intra or extra-axial fluid collections.  Small mucosal retention cysts or polyps are noted in the maxillary sinuses bilaterally.  The remaining portions of the visualized paranasal sinuses and mastoids are otherwise well pneumatized.  IMPRESSION: 1.  No acute intracranial abnormalities. 2.  Moderate cerebral and cerebellar atrophy with mild chronic microvascular ischemic changes  in the deep and periventricular white matter of the cerebral hemispheres bilaterally. 3. Multiple small mucosal retention cysts or polyps in the maxillary sinuses bilaterally.Florencia Reasons, M.D.    01/30/2012  MRI HEAD  Findings:   Small acute non hemorrhagic infarct extends from the superior margin of the left lenticular nucleus into the posterior left corona radiata.  No intracranial hemorrhage.  Small vessel disease type  changes.  Global atrophy without hydrocephalus.  No intracranial mass lesion detected on this unenhanced exam.  Partial opacification left mastoid air cells.  Polypoid opacification right maxillary sinus.  Minimal mucosal thickening remainder paranasal sinuses.  Mild transverse ligament hypertrophy.  IMPRESSION:  Small acute non hemorrhagic infarct extends from the superior margin of the left lenticular nucleus into the posterior left corona radiata.  Please see above. Fuller Canada, M.D.   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 Moderate right middle cerebral artery branch vessel narrowing irregularity.  Ectatic left internal carotid artery.  Fetal type origin left posterior cerebral artery.  Ectatic vertebral arteries and basilar artery without high-grade stenosis.  Nonvisualization right PICA and both AICAs.  Moderate stenosis superior cerebral artery bilaterally.  Moderate tandem stenoses left posterior cerebral artery.  Mild irregularity right posterior cerebral artery branches.  Minimally bulbous appearance of the basilar tip may be related to the fetal type origin of the left posterior cerebral artery with tiny aneurysm a secondary consideration.  Stability can be confirmed on follow-up.  IMPRESSION: Intracranial atherosclerotic type changes as noted above.  Minimally bulbous appearance of the basilar tip may be related to the fetal type origin of the left posterior cerebral artery with tiny aneurysm a secondary consideration.  Stability can be confirmed on follow-up   Fuller Canada, M.D.    Therapies:pending  Assessment: 78 yo male with small acute non hemorrhagic infarct extends from the superior margin of the left lenticular nucleus into the posterior left corona radiata.  He continues to have slurred speech and impaired coordination/weakness on the right. Stroke work up underway.  LDL 104  Plan: 1. HgbA1c 2. PT consult, OT consult, Speech  consult  3. Echocardiogram  4. Carotid dopplers  5. Prophylactic therapy-Plavix 6. Risk factor modification  7. Telemetry monitoring  8. Frequent neuro checks   LOS: 1 day   [name] [facility] [phone] 01/31/2012  10:53 AM I have personally examined this patient, reviewed chart and participated in developing plan of care  Delia Heady, MD

## 2012-01-31 NOTE — Progress Notes (Signed)
Physical Therapy Evaluation Patient Details Name: Mark Joseph MRN: [id number] DOB: [birth year] Today's Date: 01/31/2012 Time: 4098-1191 PT Time Calculation (min): 32 min  PT Assessment / Plan / Recommendation Clinical Impression  78 yo male admitted with slurred speech, found to have lacunar infarcts; Presents with decr functional mobility; will benefit from PT to maximize indppendence and safety with mobility, amb and to facilitate dc planning    PT Assessment  Patient needs continued PT services    Follow Up Recommendations  Home health PT;Supervision/Assistance - 24 hour    Barriers to Discharge        Equipment Recommendations  Rolling walker with 5" wheels;Tub/shower seat;3 in 1 bedside comode    Recommendations for Other Services OT consult   Frequency Min 4X/week    Precautions / Restrictions Precautions Precautions: Fall   Pertinent Vitals/Pain no apparent distress       Mobility  Bed Mobility Bed Mobility: Supine to Sit;Sitting - Scoot to Edge of Bed Supine to Sit: 4: Min assist;With rails Sitting - Scoot to [place] of Bed: 4: Min assist;With rail Details for Bed Mobility Assistance: Cues for technique and initiation; inefficient movement Transfers Transfers: Sit to Stand;Stand to Sit Sit to Stand: 4: Min assist;With upper extremity assist;From bed Stand to Sit: 4: Min assist;With upper extremity assist;With armrests;To chair/3-in-1 Details for Transfer Assistance: Noted pretty heavy dependence on UE push for successful sit to stand; Cues for technique Ambulation/Gait Ambulation/Gait Assistance: 4: Min assist Ambulation Distance (Feet): 110 Feet Assistive device: Rolling walker;1 person hand held assist Ambulation/Gait Assistance Details: Noted some decr coordination with stepping, wide step width; Cues for upright posture; More steady with bil UE support on RW Gait Pattern: Step-through pattern;Trunk flexed Modified Rankin (Stroke Patients Only) Pre-Morbid  Rankin Score: No significant disability Modified Rankin: Moderate disability    Exercises     PT Diagnosis: Difficulty walking;Generalized weakness  PT Problem List: Decreased strength;Decreased activity tolerance;Decreased balance;Decreased mobility;Decreased coordination;Decreased knowledge of use of DME;Decreased safety awareness PT Treatment Interventions: DME instruction;Gait training;Stair training;Functional mobility training;Therapeutic activities;Therapeutic exercise;Balance training;Neuromuscular re-education;Patient/family education   PT Goals Acute Rehab PT Goals PT Goal Formulation: With patient Time For Goal Achievement: 02/14/12 Potential to Achieve Goals: Good Pt will go Supine/Side to Sit: with modified independence PT Goal: Supine/Side to Sit - Progress: Goal set today Pt will go Sit to Supine/Side: with modified independence PT Goal: Sit to Supine/Side - Progress: Goal set today Pt will go Sit to Stand: with modified independence PT Goal: Sit to Stand - Progress: Goal set today Pt will go Stand to Sit: with modified independence PT Goal: Stand to Sit - Progress: Goal set today Pt will Ambulate: >150 feet;with modified independence;with rolling walker;with least restrictive assistive device PT Goal: Ambulate - Progress: Goal set today Pt will Go Up / Down Stairs: 1-2 stairs;with supervision;with least restrictive assistive device PT Goal: Up/Down Stairs - Progress: Goal set today  Visit Information  Last PT Received On: 01/31/12 Assistance Needed: +1    Subjective Data  Subjective: Agreeable to OOB Patient Stated Goal: Be able to walk better   Prior Functioning  Home Living Lives With: Spouse Available Help at Discharge: Family;Available 24 hours/day Type of Home: House Home Access: Stairs to enter [employer] of Steps: 2 (Garage steps) Entrance Stairs-Rails: None Home Layout: Two level;Able to live on main level with bedroom/bathroom Bathroom  Shower/Tub: Walk-in shower Bathroom Accessibility: Yes How Accessible: Accessible via walker Home Adaptive Equipment: Walker - standard Prior Function Level of Independence: Independent  Able to Take Stairs?: Yes (one at a time) Communication Communication: Expressive difficulties (still some slurred speech)    Cognition  Overall Cognitive Status: Appears within functional limits for tasks assessed/performed Arousal/Alertness: Awake/alert Orientation Level: Appears intact for tasks assessed Behavior During Session: [facility] for tasks performed    Extremity/Trunk Assessment Right Upper Extremity Assessment RUE ROM/Strength/Tone: [facility] for tasks assessed (simple tasks) Left Upper Extremity Assessment LUE ROM/Strength/Tone: WFL for tasks assessed (simple tasks) Right Lower Extremity Assessment RLE ROM/Strength/Tone: Deficits RLE ROM/Strength/Tone Deficits: Generally weak, requiring physical assist for sit to stand Left Lower Extremity Assessment LLE ROM/Strength/Tone: Deficits LLE ROM/Strength/Tone Deficits: Generally weak, requiring physical assist for sit to stand Trunk Assessment Trunk Assessment: Kyphotic   Balance Static Sitting Balance Static Sitting - Balance Support: Right upper extremity supported;Left upper extremity supported Static Sitting - Level of Assistance: 4: Min assist;3: Mod assist Static Sitting - Comment/# of Minutes: EOB 5 minutes with noted R lean, pt able to correct with cues, but then drifts back into R lean  End of Session PT - End of Session Equipment Utilized During Treatment: Gait belt Activity Tolerance: Patient tolerated treatment well Patient left: in chair;with call bell/phone within reach;with family/visitor present Nurse Communication: Mobility status  GP     Olen Pel [place], [place] [phone]  01/31/2012, 2:15 PM

## 2012-01-31 NOTE — Progress Notes (Signed)
VASCULAR LAB PRELIMINARY  PRELIMINARY  PRELIMINARY  PRELIMINARY  Carotid Dopplers completed.    Preliminary report:  There is no ICA stenosis.  Vertebral artery flow is antegrade.  Mark Joseph, 01/31/2012, 4:20 PM

## 2012-01-31 NOTE — ED Provider Notes (Signed)
Medical screening examination/treatment/procedure(s) were performed by non-physician practitioner and as supervising physician I was immediately available for consultation/collaboration.  Toy Baker, MD 01/31/12 1515

## 2012-01-31 NOTE — Progress Notes (Signed)
Subjective: Speech slurred  Objective: Vital signs in last 24 hours: Temp:  [98 F (36.7 C)-98.9 F (37.2 C)] 98.2 F (36.8 C) (09/22 0626) Pulse Rate:  [65-90] 78  (09/22 0626) Resp:  [14-24] 18  (09/22 0626) BP: (136-183)/(68-114) 149/89 mmHg (09/22 0626) SpO2:  [94 %-100 %] 100 % (09/22 0626) Weight:  [70.308 kg (155 lb)] 70.308 kg (155 lb) (09/21 1941) Weight change:     Intake/Output from previous day:   Intake/Output this shift:    General appearance: alert and cooperative Resp: clear to auscultation bilaterally Cardio: regular rate and rhythm, S1, S2 normal, no murmur, click, rub or gallop GI: soft, non-tender; bowel sounds normal; no masses,  no organomegaly Extremities: extremities normal, atraumatic, no cyanosis or edema Neurologic: Cranial nerves: speech slurred Motor: strength 5/5  Lab Results:  Basename 01/30/12 2034 01/30/12 1153  WBC 7.1 6.0  HGB 14.2 13.7  HCT 38.9* 38.8*  PLT 227 230   BMET  Basename 01/30/12 2034 01/30/12 1153  NA -- 134*  K -- 4.0  CL -- 96  CO2 -- 27  GLUCOSE -- 112*  BUN -- 14  CREATININE 0.85 0.85  CALCIUM -- 10.0    Studies/Results: Dg Chest 2 View  01/30/2012  *RADIOLOGY REPORT*  Clinical Data: Stroke  CHEST - 2 VIEW  Comparison: None.  Findings: Heart size is normal.  Aorta is uncoiled and ectatic. Negative for heart failure.  Negative for pneumonia or effusion.  IMPRESSION: No acute cardiopulmonary disease.   Original Report Authenticated By: Camelia Phenes, M.D.    Ct Head Wo Contrast  01/30/2012  *RADIOLOGY REPORT*  Clinical Data: History of fall with slurred speech.  Weakness.  CT HEAD WITHOUT CONTRAST  Technique:  Contiguous axial images were obtained from the base of the skull through the vertex without contrast.  Comparison: No priors.  Findings: Moderate cerebral and cerebellar atrophy is age appropriate.  There are patchy and confluent areas of decreased attenuation throughout the deep and periventricular white  matter of the cerebral hemispheres bilaterally, compatible with chronic microvascular ischemic changes.  No definite acute intracranial abnormality.  Specifically, no definite evidence of acute/subacute cerebral ischemia, no focal intracerebral hemorrhage, no mass, mass effect, hydrocephalus or abnormal intra or extra-axial fluid collections.  Small mucosal retention cysts or polyps are noted in the maxillary sinuses bilaterally.  The remaining portions of the visualized paranasal sinuses and mastoids are otherwise well pneumatized.  IMPRESSION: 1.  No acute intracranial abnormalities. 2.  Moderate cerebral and cerebellar atrophy with mild chronic microvascular ischemic changes in the deep and periventricular white matter of the cerebral hemispheres bilaterally. 3. Multiple small mucosal retention cysts or polyps in the maxillary sinuses bilaterally.   Original Report Authenticated By: Florencia Reasons, M.D.    Mr Maxine Glenn Head Wo Contrast  01/30/2012  *RADIOLOGY REPORT*  Clinical Data:  Slurred speech.  MRI BRAIN WITHOUT CONTRAST MRA HEAD WITHOUT CONTRAST  Technique: Multiplanar, multiecho pulse sequences of the brain and surrounding structures were obtained according to standard protocol without intravenous contrast.  Angiographic images of the head were obtained using MRA technique without contrast.  Comparison: 01/30/2012 head CT.  MRI HEAD  Findings:   Small acute non hemorrhagic infarct extends from the superior margin of the left lenticular nucleus into the posterior left corona radiata.  No intracranial hemorrhage.  Small vessel disease type changes.  Global atrophy without hydrocephalus.  No intracranial mass lesion detected on this unenhanced exam.  Partial opacification left mastoid air cells.  Polypoid opacification right maxillary sinus.  Minimal mucosal thickening remainder paranasal sinuses.  Mild transverse ligament hypertrophy.  IMPRESSION:  Small acute non hemorrhagic infarct extends from the  superior margin of the left lenticular nucleus into the posterior left corona radiata.  Please see above.  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 Moderate right middle cerebral artery branch vessel narrowing irregularity.  Ectatic left internal carotid artery.  Fetal type origin left posterior cerebral artery.  Ectatic vertebral arteries and basilar artery without high-grade stenosis.  Nonvisualization right PICA and both AICAs.  Moderate stenosis superior cerebral artery bilaterally.  Moderate tandem stenoses left posterior cerebral artery.  Mild irregularity right posterior cerebral artery branches.  Minimally bulbous appearance of the basilar tip may be related to the fetal type origin of the left posterior cerebral artery with tiny aneurysm a secondary consideration.  Stability can be confirmed on follow-up.  IMPRESSION: Intracranial atherosclerotic type changes as noted above.  Minimally bulbous appearance of the basilar tip may be related to the fetal type origin of the left posterior cerebral artery with tiny aneurysm a secondary consideration.  Stability can be confirmed on follow-up  Critical Value/emergent results were called by telephone at the time of interpretation on 01/30/2012 at 5:30 p.m. to Dr. Adriana Simas, who verbally acknowledged these results.   Original Report Authenticated By: Fuller Canada, M.D.    Mr Brain Wo Contrast  01/30/2012  *RADIOLOGY REPORT*  Clinical Data:  Slurred speech.  MRI BRAIN WITHOUT CONTRAST MRA HEAD WITHOUT CONTRAST  Technique: Multiplanar, multiecho pulse sequences of the brain and surrounding structures were obtained according to standard protocol without intravenous contrast.  Angiographic images of the head were obtained using MRA technique without contrast.  Comparison: 01/30/2012 head CT.  MRI HEAD  Findings:   Small acute non hemorrhagic infarct extends from the superior margin of the left lenticular nucleus into the posterior left  corona radiata.  No intracranial hemorrhage.  Small vessel disease type changes.  Global atrophy without hydrocephalus.  No intracranial mass lesion detected on this unenhanced exam.  Partial opacification left mastoid air cells.  Polypoid opacification right maxillary sinus.  Minimal mucosal thickening remainder paranasal sinuses.  Mild transverse ligament hypertrophy.  IMPRESSION:  Small acute non hemorrhagic infarct extends from the superior margin of the left lenticular nucleus into the posterior left corona radiata.  Please see above.  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 Moderate right middle cerebral artery branch vessel narrowing irregularity.  Ectatic left internal carotid artery.  Fetal type origin left posterior cerebral artery.  Ectatic vertebral arteries and basilar artery without high-grade stenosis.  Nonvisualization right PICA and both AICAs.  Moderate stenosis superior cerebral artery bilaterally.  Moderate tandem stenoses left posterior cerebral artery.  Mild irregularity right posterior cerebral artery branches.  Minimally bulbous appearance of the basilar tip may be related to the fetal type origin of the left posterior cerebral artery with tiny aneurysm a secondary consideration.  Stability can be confirmed on follow-up.  IMPRESSION: Intracranial atherosclerotic type changes as noted above.  Minimally bulbous appearance of the basilar tip may be related to the fetal type origin of the left posterior cerebral artery with tiny aneurysm a secondary consideration.  Stability can be confirmed on follow-up  Critical Value/emergent results were called by telephone at the time of interpretation on 01/30/2012 at 5:30 p.m. to Dr. Adriana Simas, who verbally acknowledged these results.   Original Report Authenticated By: Fuller Canada, M.D.  Medications: I have reviewed the patient's current medications.  Assessment/Plan: Principal Problem:  *CVA (cerebral infarction)  L lacunar type infarct, continue ASA, normal saline, CVA workup and PT/OT/Speech to see. Possibly a CIR candidate Active Problems:  Hypertension ok lisinopril/hctz on hold   LOS: 1 day   Christoffer Currier JOSEPH 01/31/2012, 8:01 AM

## 2012-01-31 NOTE — Progress Notes (Signed)
Per monitor tech, patient had 10 beat run of v-tach.  Paged Dr. Valentina Lucks. Received call back within 10 minutes.  Informed of aberrant cardiac rhythm.  Received order for magnesium level to be checked in am.  Placed order in the computer for the same.

## 2012-01-31 NOTE — Plan of Care (Signed)
Problem: Phase I Progression Outcomes Goal: Pt admitted to Stroke Unit Outcome: Completed/Met Date Met:  01/31/12 Admitted to stroke unit at 1955 on 01/30/12. Goal: Stroke Team notified of admission Outcome: Completed/Met Date Met:  01/31/12 Stroke doctor notified of arrival in emergency room. Goal: Strict NPO til swallow screen done Outcome: Completed/Met Date Met:  01/31/12 Patient kept NPO until this am; started on heart healthy. Goal: NIH Stroke Scale-NIHSS done on admission Outcome: Completed/Met Date Met:  01/31/12 NIH done on admission to stroke unit. Goal: Pain controlled with appropriate interventions Outcome: Completed/Met Date Met:  01/31/12 Patient denied any pain upon admission. Goal: Voiding-avoid urinary catheter unless indicated Outcome: Completed/Met Date Met:  01/31/12 Patient able to urinate in bathroom; encouraged to use urinal if unable to get to bathroom quickly.

## 2012-02-01 MED ORDER — LISINOPRIL-HYDROCHLOROTHIAZIDE 20-25 MG PO TABS: 1.0000 | ORAL_TABLET | Freq: Every day | ORAL | Status: DC

## 2012-02-01 MED ORDER — HYDROCHLOROTHIAZIDE 25 MG PO TABS
25.0000 mg | ORAL_TABLET | Freq: Every day | ORAL | Status: DC
  Administered 2012-02-01 – 2012-02-02 (×2): 25 mg via ORAL
  Filled 2012-02-01 (×2): qty 1

## 2012-02-01 MED ORDER — SIMVASTATIN 10 MG PO TABS
10.0000 mg | ORAL_TABLET | Freq: Every day | ORAL | Status: DC
  Administered 2012-02-01: 10 mg via ORAL
  Filled 2012-02-01 (×2): qty 1

## 2012-02-01 MED ORDER — PANTOPRAZOLE SODIUM 40 MG PO TBEC
40.0000 mg | DELAYED_RELEASE_TABLET | Freq: Every day | ORAL | Status: DC
  Administered 2012-02-01 – 2012-02-02 (×2): 40 mg via ORAL
  Filled 2012-02-01 (×2): qty 1

## 2012-02-01 MED ORDER — LISINOPRIL 20 MG PO TABS
20.0000 mg | ORAL_TABLET | Freq: Every day | ORAL | Status: DC
  Administered 2012-02-01 – 2012-02-02 (×2): 20 mg via ORAL
  Filled 2012-02-01 (×2): qty 1

## 2012-02-01 NOTE — Progress Notes (Signed)
History: Mark Joseph is an 78 y.o. male who is than having slurred speech and falls for the last 2 days. He states that started suddenly, and has been persistent. He finally came in because was not getting better. He denies any recent history of infections.   LSN:<01/28/12 tPA Given: out of window  Subjective: Wife and daughter at bedside.  Patient up in geri-chair.  Objective: BP 148/90  Pulse 78  Temp 98.9 F (37.2 C) (Oral)  Resp 18  Ht 5\' 11"  (1.803 m)  Wt 70.308 kg (155 lb)  BMI 21.62 kg/m2  SpO2 98%  CBGs  Basename 01/30/12 1206  GLUCAP 108*   Diet: heart healthy, thin Activity:up with assist  DVT Prophylaxis: lovenox  Medications: Scheduled:   . clopidogrel  75 mg Oral Q breakfast  . enoxaparin  40 mg Subcutaneous Q24H   Neurologic Exam: Mental Status: Alert, oriented, thought content appropriate.  Speech fluent without evidence of aphasia, mild dysarthria present.. Able to follow 3 step commands without difficulty. Cranial Nerves: II- Visual fields grossly intact. III/IV/VI-Extraocular movements intact.  Pupils reactive bilaterally. V/VII-Smile symmetric VIII-hearing grossly intact IX/X-normal gag XI-bilateral shoulder shrug XII-midline tongue extension Motor: 5/5 bilaterally with normal tone and bulk- slightly weaker grip on right. Sensory: Light touch intact throughout, bilaterally Deep Tendon Reflexes: 2+ and symmetric throughout Plantars: flexor at rest. Cerebellar: impaired fine finger movements on right.   Lab Results: Basic Metabolic Panel:  Lab 02/01/12 1610 01/30/12 2326 01/30/12 2034 01/30/12 1153  NA -- -- -- 134*  K -- -- -- 4.0  CL -- -- -- 96  CO2 -- -- -- 27  GLUCOSE -- -- -- 112*  BUN -- -- -- 14  CREATININE -- -- 0.85 0.85  CALCIUM -- -- -- 10.0  MG 2.1 1.9 -- --  PHOS -- -- -- --   Liver Function Tests:  Lab 01/30/12 1153  AST 23  ALT 13  ALKPHOS 44  BILITOT 0.5  PROT 7.2  ALBUMIN 4.3   CBC:  Lab 01/30/12 2034  01/30/12 1153  WBC 7.1 6.0  NEUTROABS -- 3.7  HGB 14.2 13.7  HCT 38.9* 38.8*  MCV 90.7 91.3  PLT 227 230   CBG:  Lab 01/30/12 1206  GLUCAP 108*   Fasting Lipid Panel:  Lab 01/31/12 0313  CHOL 185  HDL 67  LDLCALC 104*  TRIG 72  CHOLHDL 2.8  LDLDIRECT --   Urinalysis:  Lab 01/30/12 1547  COLORURINE YELLOW  LABSPEC 1.012  PHURINE 7.5  GLUCOSEU NEGATIVE  HGBUR NEGATIVE  BILIRUBINUR NEGATIVE  KETONESUR NEGATIVE  PROTEINUR NEGATIVE  UROBILINOGEN 0.2  NITRITE NEGATIVE  LEUKOCYTESUR NEGATIVE   Misc. Labs Urine cx no growth;   Study Results:  01/30/2012  CT HEAD WITHOUT CONTRAST 1.  No acute intracranial abnormalities. 2.  Moderate cerebral and cerebellar atrophy with mild chronic microvascular ischemic changes in the deep and periventricular white matter of the cerebral hemispheres bilaterally. 3. Multiple small mucosal retention cysts or polyps in the maxillary sinuses bilaterally.    01/30/2012  MRI HEAD  Small acute non hemorrhagic infarct extends from the superior margin of the left lenticular nucleus into the posterior left corona radiata.   01/30/2012  MRA HEAD  Intracranial atherosclerotic type changes as noted above.  Minimally bulbous appearance of the basilar tip may be related to the fetal type origin of the left posterior cerebral artery with tiny aneurysm a secondary consideration.  Stability can be confirmed on follow-up   Carotid Dopplers No  evidence of hemodynamically significant internal carotid artery stenosis. Vertebral artery flow is antegrade.  2D echo done, results pending  Therapies:  PT -  Home health  Assessment: 78 yo male with small acute non hemorrhagic infarct extends from the superior margin of the left lenticular nucleus into the posterior left corona radiata.  He continues to have slurred speech and impaired coordination/weakness on the right. Stroke felt to be thrombotic secondary to small vessel disease. On plavix for secondary stroke  prevention.  LDL 104 HgbA1c 5.6  10 beat run v-tach   Plan:  OT consult, Speech consult. Home health vs OP therapy.  F/u Echocardiogram   Prophylactic therapy-Plavix  Add low dose statin. Goal LDL < 100. This is a Artist stroke core measure.  Resume home medications, including BP medications  Likely discharge in am   LOS: 2 days   Annie Main, MSN, RN, ANVP-BC, ANP-BC, GNP-BC [facility] Pager: [phone] 02/01/2012 11:37 AM  Scribe for Dr. Delia Heady, Stroke Center Medical Director, who has personally reviewed chart, pertinent data, examined the patient and developed the plan of care. Pager:  [phone]

## 2012-02-01 NOTE — Progress Notes (Signed)
Subjective: Speech a little slurred, feels steady ambulating, no trouble swallowing  Objective: Vital signs in last 24 hours: Temp:  [97.9 F (36.6 C)-98.6 F (37 C)] 98 F (36.7 C) (09/23 0522) Pulse Rate:  [68-84] 73  (09/23 0522) Resp:  [18-20] 18  (09/23 0522) BP: (127-155)/(75-95) 154/95 mmHg (09/23 0522) SpO2:  [91 %-97 %] 97 % (09/23 0522) Weight change:  Last BM Date: 01/29/12  Intake/Output from previous day:   Intake/Output this shift:    General appearance: alert and cooperative Resp: clear to auscultation bilaterally Cardio: regular rate and rhythm, S1, S2 normal, no murmur, click, rub or gallop  Lab Results:  [facility] 01/30/12 2034 01/30/12 1153  WBC 7.1 6.0  HGB 14.2 13.7  HCT 38.9* 38.8*  PLT 227 230   BMET  Basename 01/30/12 2034 01/30/12 1153  NA -- 134*  K -- 4.0  CL -- 96  CO2 -- 27  GLUCOSE -- 112*  BUN -- 14  CREATININE 0.85 0.85  CALCIUM -- 10.0    Studies/Results: Dg Chest 2 View  01/30/2012  *RADIOLOGY REPORT*  Clinical Data: Stroke  CHEST - 2 VIEW  Comparison: None.  Findings: Heart size is normal.  Aorta is uncoiled and ectatic. Negative for heart failure.  Negative for pneumonia or effusion.  IMPRESSION: No acute cardiopulmonary disease.   Original Report Authenticated By: Camelia Phenes, M.D.    Ct Head Wo Contrast  01/30/2012  *RADIOLOGY REPORT*  Clinical Data: History of fall with slurred speech.  Weakness.  CT HEAD WITHOUT CONTRAST  Technique:  Contiguous axial images were obtained from the base of the skull through the vertex without contrast.  Comparison: No priors.  Findings: Moderate cerebral and cerebellar atrophy is age appropriate.  There are patchy and confluent areas of decreased attenuation throughout the deep and periventricular white matter of the cerebral hemispheres bilaterally, compatible with chronic microvascular ischemic changes.  No definite acute intracranial abnormality.  Specifically, no definite evidence of  acute/subacute cerebral ischemia, no focal intracerebral hemorrhage, no mass, mass effect, hydrocephalus or abnormal intra or extra-axial fluid collections.  Small mucosal retention cysts or polyps are noted in the maxillary sinuses bilaterally.  The remaining portions of the visualized paranasal sinuses and mastoids are otherwise well pneumatized.  IMPRESSION: 1.  No acute intracranial abnormalities. 2.  Moderate cerebral and cerebellar atrophy with mild chronic microvascular ischemic changes in the deep and periventricular white matter of the cerebral hemispheres bilaterally. 3. Multiple small mucosal retention cysts or polyps in the maxillary sinuses bilaterally.   Original Report Authenticated By: Florencia Reasons, M.D.    Mr Maxine Glenn Head Wo Contrast  01/30/2012  *RADIOLOGY REPORT*  Clinical Data:  Slurred speech.  MRI BRAIN WITHOUT CONTRAST MRA HEAD WITHOUT CONTRAST  Technique: Multiplanar, multiecho pulse sequences of the brain and surrounding structures were obtained according to standard protocol without intravenous contrast.  Angiographic images of the head were obtained using MRA technique without contrast.  Comparison: 01/30/2012 head CT.  MRI HEAD  Findings:   Small acute non hemorrhagic infarct extends from the superior margin of the left lenticular nucleus into the posterior left corona radiata.  No intracranial hemorrhage.  Small vessel disease type changes.  Global atrophy without hydrocephalus.  No intracranial mass lesion detected on this unenhanced exam.  Partial opacification left mastoid air cells.  Polypoid opacification right maxillary sinus.  Minimal mucosal thickening remainder paranasal sinuses.  Mild transverse ligament hypertrophy.  IMPRESSION:  Small acute non hemorrhagic infarct extends from the superior  margin of the left lenticular nucleus into the posterior left corona radiata.  Please see above.  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 Moderate right  middle cerebral artery branch vessel narrowing irregularity.  Ectatic left internal carotid artery.  Fetal type origin left posterior cerebral artery.  Ectatic vertebral arteries and basilar artery without high-grade stenosis.  Nonvisualization right PICA and both AICAs.  Moderate stenosis superior cerebral artery bilaterally.  Moderate tandem stenoses left posterior cerebral artery.  Mild irregularity right posterior cerebral artery branches.  Minimally bulbous appearance of the basilar tip may be related to the fetal type origin of the left posterior cerebral artery with tiny aneurysm a secondary consideration.  Stability can be confirmed on follow-up.  IMPRESSION: Intracranial atherosclerotic type changes as noted above.  Minimally bulbous appearance of the basilar tip may be related to the fetal type origin of the left posterior cerebral artery with tiny aneurysm a secondary consideration.  Stability can be confirmed on follow-up  Critical Value/emergent results were called by telephone at the time of interpretation on 01/30/2012 at 5:30 p.m. to Dr. Adriana Simas, who verbally acknowledged these results.   Original Report Authenticated By: Fuller Canada, M.D.    Mr Brain Wo Contrast  01/30/2012  *RADIOLOGY REPORT*  Clinical Data:  Slurred speech.  MRI BRAIN WITHOUT CONTRAST MRA HEAD WITHOUT CONTRAST  Technique: Multiplanar, multiecho pulse sequences of the brain and surrounding structures were obtained according to standard protocol without intravenous contrast.  Angiographic images of the head were obtained using MRA technique without contrast.  Comparison: 01/30/2012 head CT.  MRI HEAD  Findings:   Small acute non hemorrhagic infarct extends from the superior margin of the left lenticular nucleus into the posterior left corona radiata.  No intracranial hemorrhage.  Small vessel disease type changes.  Global atrophy without hydrocephalus.  No intracranial mass lesion detected on this unenhanced exam.  Partial  opacification left mastoid air cells.  Polypoid opacification right maxillary sinus.  Minimal mucosal thickening remainder paranasal sinuses.  Mild transverse ligament hypertrophy.  IMPRESSION:  Small acute non hemorrhagic infarct extends from the superior margin of the left lenticular nucleus into the posterior left corona radiata.  Please see above.  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 Moderate right middle cerebral artery branch vessel narrowing irregularity.  Ectatic left internal carotid artery.  Fetal type origin left posterior cerebral artery.  Ectatic vertebral arteries and basilar artery without high-grade stenosis.  Nonvisualization right PICA and both AICAs.  Moderate stenosis superior cerebral artery bilaterally.  Moderate tandem stenoses left posterior cerebral artery.  Mild irregularity right posterior cerebral artery branches.  Minimally bulbous appearance of the basilar tip may be related to the fetal type origin of the left posterior cerebral artery with tiny aneurysm a secondary consideration.  Stability can be confirmed on follow-up.  IMPRESSION: Intracranial atherosclerotic type changes as noted above.  Minimally bulbous appearance of the basilar tip may be related to the fetal type origin of the left posterior cerebral artery with tiny aneurysm a secondary consideration.  Stability can be confirmed on follow-up  Critical Value/emergent results were called by telephone at the time of interpretation on 01/30/2012 at 5:30 p.m. to Dr. Adriana Simas, who verbally acknowledged these results.   Original Report Authenticated By: Fuller Canada, M.D.     Medications: I have reviewed the patient's current medications.  Assessment/Plan:   LOS: 2 days  Principal Problem:  *CVA (cerebral infarction) L lacunar type infarct,  continue plavix, ,carotid US neg, echo pending.  OT/Speech to see.Noted PT recommended HHPT.   Any need for inpatient rehab? Active Problems:    Hypertension ok lisinopril/hctz on hold V tach.  Brief run on telemetry, continue to monitor.  Echo pending Possible discharge in am  [facility] 02/01/2012, 7:08 AM

## 2012-02-01 NOTE — Evaluation (Signed)
Occupational Therapy Evaluation Patient Details Name: Mark Joseph MRN: [id number] DOB: November 22, 1921 Today's Date: 02/01/2012 Time: 8469-6295 OT Time Calculation (min): 27 min  OT Assessment / Plan / Recommendation Clinical Impression  Pt persents to OT with decreased I with ADL activity s/p admit to hospital . Pt will benefit from skilled OT to increase I with ADL activity in order to regain I and return to PLOF.    OT Assessment  Patient needs continued OT Services          Equipment Recommendations  3 in 1 bedside comode       Frequency  Min 2X/week    Precautions / Restrictions Precautions Precautions: Fall       ADL  Eating/Feeding: Simulated;Min guard Where Assessed - Eating/Feeding: Edge of bed Grooming: Simulated Where Assessed - Grooming: Supported standing Upper Body Bathing: Simulated;Minimal assistance Where Assessed - Upper Body Bathing: Unsupported sitting Lower Body Bathing: Simulated;Minimal assistance Where Assessed - Lower Body Bathing: Unsupported sit to stand Upper Body Dressing: Simulated;Minimal assistance Where Assessed - Upper Body Dressing: Unsupported sitting Lower Body Dressing: Simulated;Minimal assistance Where Assessed - Lower Body Dressing: Unsupported sit to stand Toilet Transfer: Simulated;Minimal assistance Toilet Transfer Method: Sit to stand Toilet Transfer Equipment: Comfort height toilet Where Assessed - Toileting Clothing Manipulation and Hygiene: Standing Tub/Shower Transfer: Simulated;Minimal assistance Transfers/Ambulation Related to ADLs: Discussed need for tub seat. Will defer to [facility] due to small shower. Pt will need a 3 n 1 for over the toilet.    OT Diagnosis:    OT Problem List: Decreased strength;Decreased coordination;Impaired UE functional use OT Treatment Interventions: Self-care/ADL training;DME and/or AE instruction;Patient/family education      Visit Information  Last OT Received On: 02/01/12 Assistance Needed:  +1    Subjective Data  Subjective: i am having some problems with my right hand- writing and stuff   Prior Functioning  Vision/Perception  Home Living Lives With: Spouse Available Help at Discharge: Family;Available 24 hours/day Type of Home: House Home Access: Stairs to enter [employer] of Steps: 2 (Garage steps) Entrance Stairs-Rails: None Home Layout: Two level;Able to live on main level with bedroom/bathroom Bathroom Shower/Tub: Walk-in shower Bathroom Accessibility: Yes How Accessible: Accessible via walker Home Adaptive Equipment: Walker - standard Prior Function Level of Independence: Independent Able to Take Stairs?: Yes (one at a time) Communication Communication:  (still some slurred speech) Dominant Hand: Right      Cognition  Overall Cognitive Status: Impaired Area of Impairment: Safety/judgement Arousal/Alertness: Awake/alert Orientation Level: Appears intact for tasks assessed Behavior During Session: [facility] for tasks performed Safety/Judgement: Impulsive    Extremity/Trunk Assessment Right Upper Extremity Assessment RUE Coordination: Deficits RUE Coordination Deficits: deficits with writing and small FM task    Mobility  Shoulder Instructions  Bed Mobility Bed Mobility: Not assessed Transfers Sit to Stand: 4: Min guard;5: Supervision;With upper extremity assist;With armrests;From chair/3-in-1 Stand to Sit: 5: Supervision;With upper extremity assist;To chair/3-in-1;With armrests Details for Transfer Assistance: cues for safe hand placement, definite need of use of UE to assist  sit->stand secondary to lower extremity weakness; sit<>stand x3 for practice of safe technique             End of Session OT - End of Session Activity Tolerance: Patient tolerated treatment well Patient left: in chair;with family/visitor present;with call bell/phone within reach       [facility], [place] Kung 02/01/2012, 3:49 PM

## 2012-02-01 NOTE — Evaluation (Signed)
Speech Language Pathology Evaluation Patient Details Name: Mark Joseph MRN: [id number] DOB: 12/29/1921 Today's Date: 02/01/2012 Time: 5784-6962 SLP Time Calculation (min): 19 min  Problem List:  Patient Active Problem List  Diagnosis  . CVA (cerebral infarction)  . Hypertension   Past Medical History:  Past Medical History  Diagnosis Date  . Hypertension   . GERD (gastroesophageal reflux disease)   . Osteoporosis    Past Surgical History:  Past Surgical History  Procedure Date  . Appendectomy    HPI:  78 yo male with small acute non hemorrhagic infarct extends from the superior margin of the left lenticular nucleus into the posterior left corona radiata.    Assessment / Plan / Recommendation Clinical Impression  Patient presents with mild residual dysarthria characterized by phonemic distortions sounding similar to a lateral lisp as primary residuals deficits s/p acute CVA. Overall, intelligibility functional for communication of needs and wants. No further acute SLP needs indicated however recommend SLP [facility] f/u for further treatment after d/c. Education complete with patient, wife, and daugther regarding speech intelligibility strategies for current use while continuing to recover.     SLP Assessment  All further Speech Lanaguage Pathology  needs can be addressed in the next venue of care    Follow Up Recommendations  Home health SLP    Frequency and Duration        Pertinent Vitals/Pain n/a     SLP Evaluation Prior Functioning  Cognitive/Linguistic Baseline: Within functional limits Type of Home: House Lives With: Spouse Available Help at Discharge: Family;Available 24 hours/day   Cognition  Overall Cognitive Status: Appears within functional limits for tasks assessed Orientation Level: Oriented X4    Comprehension  Auditory Comprehension Overall Auditory Comprehension: Appears within functional limits for tasks assessed Visual  Recognition/Discrimination Discrimination: Within Function Limits Reading Comprehension Reading Status: Within funtional limits    Expression Expression Primary Mode of Expression: Verbal Verbal Expression Overall Verbal Expression: Appears within functional limits for tasks assessed Written Expression Dominant Hand: Right Written Expression: Not tested   Oral / Motor Oral Motor/Sensory Function Overall Oral Motor/Sensory Function: Impaired Labial ROM: Reduced right (slight) Labial Symmetry: Abnormal symmetry right (slight) Labial Strength: Within Functional Limits Labial Sensation: Within Functional Limits Lingual ROM: Within Functional Limits Lingual Symmetry: Within Functional Limits Lingual Strength: Within Functional Limits Lingual Sensation: Within Functional Limits Facial ROM: Within Functional Limits Facial Symmetry: Within Functional Limits Facial Strength: Within Functional Limits Facial Sensation: Within Functional Limits Velum: Within Functional Limits Mandible: Within Functional Limits Motor Speech Overall Motor Speech: Impaired Respiration: Within functional limits Phonation: Normal Resonance: Within functional limits Articulation: Impaired Level of Impairment: Word Intelligibility: Intelligibility reduced Word: 75-100% accurate (distortions primarily with blends) Motor Planning: Witnin functional limits   GO   Mark Lango MA, CCC-SLP [phone]   Mark Joseph Mark Joseph 02/01/2012, 1:42 PM

## 2012-02-01 NOTE — Progress Notes (Signed)
Physical Therapy Treatment Patient Details Name: Mark Joseph MRN: [id number] DOB: 12-09-1921 Today's Date: 02/01/2012 Time: 1191-4782 PT Time Calculation (min): 52 min  PT Assessment / Plan / Recommendation Comments on Treatment Session  Majority of session focused on pt and family education concerning safety at home, specifically with use of RW, clearing pathways, picking up throw rugs and safety on the stairs. I recommend pt not go up and down the full flight of stairs inside the home until cleared by the home health physical therapist. Pt and wife aware although not sure how receptive patient is to this recommendation. Will see early tomorrow morning prior to d/c to reinforce safe stair negotiation as pt slightly impulsive. We discussed several peices of equipment especially 3in1 as pt and wife report their toilet is way too short for him to get up from at home. I personally demonstrated how to use this. Patient also reporting arthritic knee and hip on the right (likely has been favoring this leg which could be contributing some to his weakness).     Follow Up Recommendations  Home health PT;Supervision/Assistance - 24 hour    Barriers to Discharge        Equipment Recommendations  Rolling walker with 5" wheels;3 in 1 bedside comode;Tub/shower seat    Recommendations for Other Services    Frequency Min 4X/week   Plan Discharge plan remains appropriate;Frequency remains appropriate    Precautions / Restrictions Precautions Precautions: Fall       Mobility  Bed Mobility Bed Mobility: Not assessed Transfers Sit to Stand: 4: Min guard;5: Supervision;With upper extremity assist;With armrests;From chair/3-in-1 Stand to Sit: 5: Supervision;With upper extremity assist;To chair/3-in-1;With armrests Details for Transfer Assistance: cues for safe hand placement, definite need of use of UE to assist  sit->stand secondary to lower extremity weakness; sit<>stand x3 for practice of safe  technique Ambulation/Gait Ambulation/Gait Assistance: 5: Supervision;4: Min guard;4: Min assist Ambulation Distance (Feet): 400 Feet Assistive device: Rolling walker Ambulation/Gait Assistance Details: verbal cues for safe speed and focus on safe technique with RW, some difficulty with turning specifically coordinating his RLE and minA to stabilize; cues for upright posture and forward gaze Gait Pattern: Trunk flexed;Shuffle;Step-through pattern General Gait Details: more shuffled pattern with turns Stairs: Yes Stairs Assistance: 4: Min assist Stairs Assistance Details (indicate cue type and reason): pt relatively impulsive with steps needing very close gaurding and occasional verbal/tactile cues to stop and re-focus on safety (i.e. pt initiating step too far from lower step, slight posterior LOB when descending step); pt also using rails today although reports no railing when entering the home; spent at least 5 minutes reinforcing my recommendation that patient not perform 1 flight of steps at home until cleared by the home health physical therapist Stair Management Technique: One rail Right;Step to pattern Number of Stairs: 6       PT Goals Acute Rehab PT Goals PT Goal: Sit to Stand - Progress: Progressing toward goal PT Goal: Stand to Sit - Progress: Progressing toward goal PT Goal: Ambulate - Progress: Progressing toward goal PT Goal: Up/Down Stairs - Progress: Progressing toward goal  Visit Information  Last PT Received On: 02/01/12 Assistance Needed: +1    Subjective Data  Subjective: I would like to go home.  Patient Stated Goal: home safely   Cognition  Overall Cognitive Status: Impaired Area of Impairment: Safety/judgement Arousal/Alertness: Awake/alert Orientation Level: Appears intact for tasks assessed Behavior During Session: [facility] for tasks performed Safety/Judgement: Impulsive    Balance  End of Session PT - End of Session Equipment Utilized During Treatment:  Gait belt Activity Tolerance: Patient tolerated treatment well Patient left: in chair;with call bell/phone within reach Nurse Communication: Mobility status   GP     [facility] HELEN 02/01/2012, 3:08 PM

## 2012-02-01 NOTE — Care Management Note (Signed)
    Page 1 of 2   02/02/2012     4:39:24 PM   CARE MANAGEMENT NOTE 02/02/2012  Patient:  Mark Joseph, Mark Joseph   Account Number:  [id number]  Date Initiated:  02/01/2012  Documentation initiated by:  Onnie Boer  Subjective/Objective Assessment:   PT WAS ADMITTED WITH SLURRED SPEECH AND FALLS     Action/Plan:   PROGRESSION OF CARE AND DISCHARGE PLANNING   Anticipated DC Date:  02/03/2012   Anticipated DC Plan:  HOME W HOME HEALTH SERVICES      DC Planning Services  CM consult      Choice offered to / List presented to:  C-1 Patient   DME arranged  3-N-1  Levan Hurst      DME agency  [facility]     HH arranged  HH-2 PT  HH-3 OT  HH-5 SPEECH THERAPY      HH agency  [facility]   Status of service:  Completed, signed off Medicare Important Message given?   (If response is "NO", the following Medicare IM given date fields will be blank) Date Medicare IM given:   Date Additional Medicare IM given:    Discharge Disposition:  HOME W HOME HEALTH SERVICES  Per UR Regulation:  Reviewed for med. necessity/level of care/duration of stay  If discussed at Long Length of Stay Meetings, dates discussed:    Comments:  02/01/12 Onnie Boer, RN, BSN 1147 PT WAS ADMITTED WITH SLURRED SPEECH AND FALLS.  PTA PT WAS AT HOME.  WILL F/U ON DC NEEDS AND RECOMMENDATIONS.

## 2012-02-02 MED ORDER — PRAVASTATIN SODIUM 20 MG PO TABS: 20.0000 mg | ORAL_TABLET | Freq: Every day | ORAL | Status: DC

## 2012-02-02 MED ORDER — CLOPIDOGREL BISULFATE 75 MG PO TABS: 75.0000 mg | ORAL_TABLET | Freq: Every day | ORAL | Status: DC

## 2012-02-02 NOTE — Progress Notes (Signed)
Patient evaluated for long-term disease management services with [facility] Care Management Program as a benefit of her [employer]. Patient will receive a post discharge transition of care call and evaluated for monthly home visits for assessments and for education. Spoke with patient, wife, and daughter at bedside regarding [facility] Care Management services. Explained that these services will not replace or interfere with services arranged by inpatient case manager. Aware that patient to discharge today with wife and [facility]. Left [facility] Care Management packet at bedside. Appreciative of visit.   Raiford Noble, MSN- Ed, RN,BSN [facility] Liaison [phone]

## 2012-02-02 NOTE — Progress Notes (Signed)
History: Mark LEVARIO is an 78 y.o. male who is than having slurred speech and falls for the last 2 days. He states that started suddenly, and has been persistent. He finally came in because was not getting better. He denies any recent history of infections.   LSN:<01/28/12 tPA Given: out of window  Subjective: Wife and daughter at bedside.  Patient up in geri-chair.  Objective: BP 129/85  Pulse 79  Temp 97.5 F (36.4 C) (Oral)  Resp 20  Ht 5\' 11"  (1.803 m)  Wt 70.308 kg (155 lb)  BMI 21.62 kg/m2  SpO2 97%  CBGs  Basename 01/30/12 1206  GLUCAP 108*   Diet: heart healthy, thin Activity:up with assist  DVT Prophylaxis: lovenox  Medications: Scheduled:    . clopidogrel  75 mg Oral Q breakfast  . enoxaparin  40 mg Subcutaneous Q24H  . hydrochlorothiazide  25 mg Oral Daily  . lisinopril  20 mg Oral Daily  . pantoprazole  40 mg Oral Daily  . simvastatin  10 mg Oral q1800  . DISCONTD: lisinopril-hydrochlorothiazide  1 tablet Oral Daily   Neurologic Exam: Mental Status: Alert, oriented, thought content appropriate.  Speech fluent without evidence of aphasia, mild dysarthria present.. Able to follow 3 step commands without difficulty. Cranial Nerves: II- Visual fields grossly intact. III/IV/VI-Extraocular movements intact.  Pupils reactive bilaterally. V/VII-Smile symmetric VIII-hearing grossly intact IX/X-normal gag XI-bilateral shoulder shrug XII-midline tongue extension Motor: 5/5 bilaterally with normal tone and bulk- slightly weaker grip on right. Sensory: Light touch intact throughout, bilaterally Deep Tendon Reflexes: 2+ and symmetric throughout Plantars: flexor at rest. Cerebellar: impaired fine finger movements on right.   Lab Results: Basic Metabolic Panel:  Lab 02/01/12 1914 01/30/12 2326 01/30/12 2034 01/30/12 1153  NA -- -- -- 134*  K -- -- -- 4.0  CL -- -- -- 96  CO2 -- -- -- 27  GLUCOSE -- -- -- 112*  BUN -- -- -- 14  CREATININE -- -- 0.85 0.85    CALCIUM -- -- -- 10.0  MG 2.1 1.9 -- --  PHOS -- -- -- --   Liver Function Tests:  Lab 01/30/12 1153  AST 23  ALT 13  ALKPHOS 44  BILITOT 0.5  PROT 7.2  ALBUMIN 4.3   CBC:  Lab 01/30/12 2034 01/30/12 1153  WBC 7.1 6.0  NEUTROABS -- 3.7  HGB 14.2 13.7  HCT 38.9* 38.8*  MCV 90.7 91.3  PLT 227 230   CBG:  Lab 01/30/12 1206  GLUCAP 108*   Fasting Lipid Panel:  Lab 01/31/12 0313  CHOL 185  HDL 67  LDLCALC 104*  TRIG 72  CHOLHDL 2.8  LDLDIRECT --   Urinalysis:  Lab 01/30/12 1547  COLORURINE YELLOW  LABSPEC 1.012  PHURINE 7.5  GLUCOSEU NEGATIVE  HGBUR NEGATIVE  BILIRUBINUR NEGATIVE  KETONESUR NEGATIVE  PROTEINUR NEGATIVE  UROBILINOGEN 0.2  NITRITE NEGATIVE  LEUKOCYTESUR NEGATIVE   Misc. Labs Urine cx no growth;   Study Results:  01/30/2012  CT HEAD WITHOUT CONTRAST 1.  No acute intracranial abnormalities. 2.  Moderate cerebral and cerebellar atrophy with mild chronic microvascular ischemic changes in the deep and periventricular white matter of the cerebral hemispheres bilaterally. 3. Multiple small mucosal retention cysts or polyps in the maxillary sinuses bilaterally.    01/30/2012  MRI HEAD  Small acute non hemorrhagic infarct extends from the superior margin of the left lenticular nucleus into the posterior left corona radiata.   01/30/2012  MRA HEAD  Intracranial atherosclerotic type changes  as noted above.  Minimally bulbous appearance of the basilar tip may be related to the fetal type origin of the left posterior cerebral artery with tiny aneurysm a secondary consideration.  Stability can be confirmed on follow-up   Carotid Dopplers No evidence of hemodynamically significant internal carotid artery stenosis. Vertebral artery flow is antegrade.  2D echo  EF 45-50% with no source of embolus.   Therapies:  PT, OT and ST -  Home health  Assessment: 78 yo male with small acute non hemorrhagic infarct extends from the superior margin of the left  lenticular nucleus into the posterior left corona radiata.  He continues to have slurred speech and impaired coordination/weakness on the right. Stroke felt to be thrombotic secondary to small vessel disease. On plavix for secondary stroke prevention. Therapy recommends HH.  LDL 104, statin added this admission HgbA1c 5.6  10 beat run v-tach   Plan:  Home health PT, OT and ST  Prophylactic therapy-Plavix  Agree with discharge. Stroke Service will sign off. Follow up with Dr. Pearlean Brownie, Stroke Clinic, in 2 months.  Discuss with family at the bedside.   LOS: 3 days   Annie Main, MSN, RN, ANVP-BC, ANP-BC, GNP-BC [facility] Pager: [phone] 02/02/2012 11:23 AM  Scribe for Dr. Delia Heady, Stroke Center Medical Director, who has personally reviewed chart, pertinent data, examined the patient and developed the plan of care. Pager:  [phone]

## 2012-02-02 NOTE — Progress Notes (Signed)
Physical Therapy Treatment Patient Details Name: Mark Joseph MRN: [id number] DOB: 1922-01-11 Today's Date: 02/02/2012 Time: 1012-1043 PT Time Calculation (min): 31 min  PT Assessment / Plan / Recommendation Comments on Treatment Session  Moving better today with improved safety awarenes. Able to verbalize and demonstrate safe technique with RW with transfers as well as bumps/obstacles in the house (family reports carpeted throughout). Educated family and patient on safety concerns especially with stairs and clearing obstacles/clutter for a clear path within the home. Daughter to help get patient into the house today and then patient to follow up with the therapist from home health for further practice. Per chart pt to d/c home today.     Follow Up Recommendations  Home health PT;Supervision/Assistance - 24 hour    Barriers to Discharge        Equipment Recommendations  Rolling walker with 5" wheels;3 in 1 bedside comode;Tub/shower seat    Recommendations for Other Services    Frequency     Plan Discharge plan remains appropriate;Frequency remains appropriate    Precautions / Restrictions Precautions Precautions: Fall       Mobility  Bed Mobility Bed Mobility: Not assessed (pt upright in chair) Transfers Sit to Stand: 5: Supervision;With upper extremity assist;From chair/3-in-1 Stand to Sit: 5: Supervision;With upper extremity assist;To chair/3-in-1 Details for Transfer Assistance: cues for hand placement and safety with RW Ambulation/Gait Ambulation/Gait Assistance: 5: Supervision Ambulation Distance (Feet): 150 Feet Assistive device: Rolling walker Ambulation/Gait Assistance Details: cues for upright posture and safety with turns (slowing down to clear feet) Gait Pattern: Step-through pattern;Trunk flexed;Shuffle Stairs: Yes Stairs Assistance: 4: Min assist Stairs Assistance Details (indicate cue type and reason): pt used railing to simulate the door frame he will hold onto  at home, 1st practice round pt ascended/descended steps with HHA left and railing on the right and minA to stabilize at trunk; practice x2 pt ascended/descended with wife on stairs with same technique, some concern as pt's wife is a bit unsteady herself however daughter will be with them today to help get him in the house and patient is to follow up with the therapist at home to further practice stairs before trying them alone Stair Management Technique: One rail Right Number of Stairs: 6       PT Goals Acute Rehab PT Goals PT Goal: Sit to Stand - Progress: Progressing toward goal PT Goal: Stand to Sit - Progress: Progressing toward goal PT Goal: Ambulate - Progress: Progressing toward goal PT Goal: Up/Down Stairs - Progress: Progressing toward goal  Visit Information  Last PT Received On: 02/02/12 Assistance Needed: +1    Subjective Data  Subjective: I am waiting on my wife to get here.    Cognition  Overall Cognitive Status: Appears within functional limits for tasks assessed/performed Area of Impairment: Attention Arousal/Alertness: Awake/alert Orientation Level: Appears intact for tasks assessed Behavior During Session: [facility] for tasks performed Safety/Judgement: Impulsive    Balance     End of Session PT - End of Session Equipment Utilized During Treatment: Gait belt Activity Tolerance: Patient tolerated treatment well Patient left: in chair;with call bell/phone within reach Nurse Communication: Mobility status   GP     [facility] HELEN 02/02/2012, 1:15 PM

## 2012-02-02 NOTE — Progress Notes (Signed)
Occupational Therapy Treatment Patient Details Name: Mark Joseph MRN: [id number] DOB: 10-Nov-1921 Today's Date: 02/02/2012 Time: 4098-1191 OT Time Calculation (min): 30 min  OT Assessment / Plan / Recommendation    Follow Up Recommendations  Home health OT             Frequency Min 2X/week   Plan Discharge plan remains appropriate           ADL  Toilet Transfer: Performed;Min guard Toilet Transfer Method: Sit to stand Toilet Transfer Equipment: Comfort height toilet;Grab bars Toileting - Clothing Manipulation and Hygiene: Performed Where Assessed - Engineer, mining and Hygiene: Standing ADL Comments: Handout provided regarding FM tasks for R hand, as well as pt practiced handwriting with R hand.  Pt needs further practice with R hand and FM skills      OT Goals Acute Rehab OT Goals OT Goal Formulation: With patient ADL Goals Pt Will Transfer to Toilet: with supervision ADL Goal: Toilet Transfer - Progress: Goal set today Pt Will Perform Toileting - Clothing Manipulation: with supervision ADL Goal: Toileting - Clothing Manipulation - Progress: Goal set today Pt Will Perform Toileting - Hygiene: with supervision ADL Goal: Toileting - Hygiene - Progress: Goal set today Arm Goals Additional Arm Goal #1: Pt will perform activity with R UE to increase  function and I with ADL activity with S  Visit Information  Last OT Received On: 02/02/12 Reason Eval/Treat Not Completed: Patient's level of consciousness    Subjective Data  Subjective: my writing seems a bit better   Prior Functioning       Cognition  Overall Cognitive Status: Appears within functional limits for tasks assessed/performed Arousal/Alertness: Awake/alert Orientation Level: Appears intact for tasks assessed Behavior During Session: [facility] for tasks performed    Mobility  Shoulder Instructions Bed Mobility Bed Mobility: Not assessed Transfers Sit to Stand: 4: Min guard;With upper  extremity assist;With armrests;From chair/3-in-1 Stand to Sit: With upper extremity assist;To chair/3-in-1;With armrests;4: Min guard;To toilet             End of Session OT - End of Session Activity Tolerance: Patient tolerated treatment well Patient left: in chair;with call bell/phone within reach  GO     [facility], [place] Kung 02/02/2012, 10:49 AM

## 2012-02-02 NOTE — Discharge Summary (Signed)
Physician Discharge Summary  Patient ID: Mark Joseph MRN: [id number] DOB/AGE: 1921-11-01 78 y.o.  Admit date: 01/30/2012 Discharge date: 02/02/2012  Admission Diagnoses: Dysarthria Hypertension  Discharge Diagnoses:  Principal Problem:  *CVA (cerebral infarction) Active Problems:  Hypertension   Discharged Condition: good  Hospital Course: The patient was admitted on 01/30/2012 complaining of slurred speech and multiple falls over last several days prior to admission. MRI showed a acute small nonhemorrhagic left-sided CVA. The patient was admitted for stroke treatment and workup. He was placed on Plavix. He was given normal saline. His blood pressure medicine was initially held. His MRI did show small acute nonhemorrhagic infarct extending from superior margin of the left lenticular nucleus to the posterior left corona radiata. MRA of the head showed intracranial atherosclerotic changes but no significant stenoses. Carotid ultrasound showed no significant stenosis. Echocardiogram showed mild decrease in ejection fraction between 45 and 50% but no source of embolism. The patient was seen by physical, occupational and speech therapies. He passed his swallowing screening. All 3 therapist recommended home therapy followup. The patient's dysarthria did improve some during hospitalization. He had no other focal neurologic findings. He was seen by the neurology stroke service who had recommended stroke workup, Plavix and starting low-dose pravastatin. The patient's lipid profile showed cholesterol 185, HDL 67, LDL 104. At the recommendation of physical therapy a rolling walker with 5 inch wheels, 31 bedside commode and to shower C. was obtained  Consults: neurology  Significant Diagnostic Studies: labs: No significant abnormality his and radiology: MRI: As above  Treatments: IV hydration, anticoagulation: Plavix and therapies: PT, OT and ST  Discharge Exam: Blood pressure 140/92, pulse 61,  temperature 97.9 F (36.6 C), temperature source Oral, resp. rate 20, height 5\' 11"  (1.803 m), weight 70.308 kg (155 lb), SpO2 99.00%. Resp: clear to auscultation bilaterally Heart regular rate and rhythm without murmur gallop or rub Neurologic mild dysarthria  Disposition: Home     Medication List     As of 02/02/2012  6:23 AM    STOP taking these medications         aspirin EC 81 MG tablet      TAKE these medications         alendronate 70 MG tablet   Commonly known as: FOSAMAX   Take 70 mg by mouth every 7 (seven) days. Take on Sundays. Take with a full glass of water on an empty stomach.      CALCIUM PO   Take 1 tablet by mouth 2 (two) times daily.      clopidogrel 75 MG tablet   Commonly known as: PLAVIX   Take 1 tablet (75 mg total) by mouth daily with breakfast.      lisinopril-hydrochlorothiazide 20-25 MG per tablet   Commonly known as: PRINZIDE,ZESTORETIC   Take 1 tablet by mouth daily.      pantoprazole 40 MG tablet   Commonly known as: PROTONIX   Take 40 mg by mouth daily.      pravastatin 20 MG tablet   Commonly known as: PRAVACHOL   Take 1 tablet (20 mg total) by mouth daily.           Follow-up Information    Follow up with Lillia Mountain, MD. In 10 days.   Contact information:   [address], [address] Jaynie Crumble [place] [address] [phone]          Signed: Lillia Mountain 02/02/2012, 6:23 AM

## 2012-08-30 ENCOUNTER — Encounter (INDEPENDENT_AMBULATORY_CARE_PROVIDER_SITE_OTHER): Payer: Self-pay

## 2012-08-30 ENCOUNTER — Ambulatory Visit (INDEPENDENT_AMBULATORY_CARE_PROVIDER_SITE_OTHER): Payer: Medicare Other | Admitting: General Surgery

## 2012-08-30 ENCOUNTER — Encounter (INDEPENDENT_AMBULATORY_CARE_PROVIDER_SITE_OTHER): Payer: Self-pay | Admitting: General Surgery

## 2012-08-30 VITALS — BP 110/78 | HR 70 | Temp 97.6°F | Resp 18 | Ht 71.0 in | Wt 154.6 lb

## 2012-08-30 DIAGNOSIS — K4020 Bilateral inguinal hernia, without obstruction or gangrene, not specified as recurrent: Secondary | ICD-10-CM

## 2012-08-30 NOTE — Progress Notes (Signed)
Patient ID: Mark Joseph, male   DOB: October 16, 1921, 79 y.o.   MRN: [id number]  Chief Complaint  Patient presents with  . New Evaluation    eval bil ing hernia    HPI Mark Joseph is a 79 y.o. male.  Referred by Dr Valentina Lucks HPI This is an 79 year old male who lives at home with his 34 year old wife. They both are still very functional. Both have been participating in exercise programs until recently. He's had trouble with his knees as well as a recent onset of a very large right groin hernia. He is a long-standing left inguinal hernia for which has not really changed and does not have a lot of symptoms. The right side is gone considerably bigger and does not ever reduce now. This is causing to decrease his activity and is giving him a fair amount of discomfort now as well. He has no real changes in his bowel movements and no real change in urination. He did have a small stroke in  September of 2013 with some residual speech deficits. Past Medical History  Diagnosis Date  . Hypertension   . GERD (gastroesophageal reflux disease)   . Osteoporosis   . Stroke 01/2012    Past Surgical History  Procedure Laterality Date  . Appendectomy      60 years ago.    History reviewed. No pertinent family history.  Social History History  Substance Use Topics  . Smoking status: Former Smoker    Types: Cigarettes    Quit date: 05/11/1968  . Smokeless tobacco: Never Used  . Alcohol Use: 1.2 oz/week    2 Glasses of wine per week     Comment: Daily.    No Known Allergies  Current Outpatient Prescriptions  Medication Sig Dispense Refill  . alendronate (FOSAMAX) 70 MG tablet Take 70 mg by mouth every 7 (seven) days. Take on Sundays. Take with a full glass of water on an empty stomach.      Marland Kitchen CALCIUM PO Take 1 tablet by mouth 2 (two) times daily.      . clopidogrel (PLAVIX) 75 MG tablet Take 1 tablet (75 mg total) by mouth daily with breakfast.  30 tablet  11  . lisinopril-hydrochlorothiazide  (PRINZIDE,ZESTORETIC) 20-25 MG per tablet Take 1 tablet by mouth daily.      . pantoprazole (PROTONIX) 40 MG tablet Take 40 mg by mouth daily.      . pravastatin (PRAVACHOL) 20 MG tablet Take 1 tablet (20 mg total) by mouth daily.  30 tablet  11   No current facility-administered medications for this visit.    Review of Systems Review of Systems  Constitutional: Negative for fever, chills and unexpected weight change.  HENT: Negative for hearing loss, congestion, sore throat, trouble swallowing and voice change.   Eyes: Negative for visual disturbance.  Respiratory: Negative for cough and wheezing.   Cardiovascular: Negative for chest pain, palpitations and leg swelling.  Gastrointestinal: Negative for nausea, vomiting, abdominal pain, diarrhea, constipation, blood in stool, abdominal distention, anal bleeding and rectal pain.  Genitourinary: Negative for hematuria and difficulty urinating.  Musculoskeletal: Negative for arthralgias.  Skin: Negative for rash and wound.  Neurological: Negative for seizures, syncope, weakness and headaches.  Hematological: Negative for adenopathy. Bruises/bleeds easily.  Psychiatric/Behavioral: Negative for confusion.    Blood pressure 110/78, pulse 70, temperature 97.6 F (36.4 C), temperature source Temporal, resp. rate 18, height 5\' 11"  (1.803 m), weight 154 lb 9.6 oz (70.126 kg).  Physical Exam Physical Exam  Vitals reviewed. Constitutional: He appears well-developed and well-nourished.  Cardiovascular: Normal rate, regular rhythm and normal heart sounds.   Pulmonary/Chest: Effort normal and breath sounds normal.  Abdominal: Soft. Bowel sounds are normal. There is no tenderness. A hernia is present. Hernia confirmed positive in the right inguinal area (large nonreducible nontender right inguinal hernia with what appears to be bowel present) and confirmed positive in the left inguinal area (small reducible lih).    Data Reviewed Dr Kandyce Rud  notes  Assessment    Bilateral IH     Plan    I think he at least needs the right side repaired. This is very symptomatic to him and it is decreasing his activity. I think this is going to need to have to be done in open fashion. I don't think further released this is amenable to a laparoscopic repair. I am really unable even to reduce it in the office today. I discussed a right inguinal hernia repair with mesh. I think if we are going to take to risk of putting him to sleep under general anesthesia I think a repair of the left side would be reasonable at the same time. My biggest concern with him is his recent stroke and the fact that he is on Plavix. I would be willing to leave him on a baby aspirin through his surgery although there would be a slightly higher risk of having a hematoma. He is due to see Dr. Valentina Lucks tomorrow and we can come up with a plan to do this safely. I discussed observation with him in the possible risks and benefits of that but he does not wish to pursue this now. We discussed observation versus repair.  We discussed both laparoscopic and open inguinal hernia repairs. I described the procedure in detail.  The patient was given educational material.  Goals should be achieved with surgery. We discussed the usage of mesh and the rationale behind that. We went over the pathophysiology of inguinal hernia. We have elected to perform open inguinal hernia repair with mesh.  We discussed the risks including bleeding, infection, recurrence, postoperative pain and chronic groin pain, testicular injury, urinary retention, numbness in groin and around incision.          Daniyal Tabor 08/30/2012, 4:58 PM

## 2012-09-01 ENCOUNTER — Telehealth (INDEPENDENT_AMBULATORY_CARE_PROVIDER_SITE_OTHER): Payer: Self-pay

## 2012-09-01 NOTE — Telephone Encounter (Signed)
LMOM for nurse to call me about the status of the medical clearance along with instructions of stopping the Plavix 5 days before the surgery.

## 2012-09-02 ENCOUNTER — Encounter (INDEPENDENT_AMBULATORY_CARE_PROVIDER_SITE_OTHER): Payer: Self-pay

## 2012-09-02 NOTE — Telephone Encounter (Signed)
Called pt to notify him that we did receive the cardiac clearance from Dr Jone Baseman office along with instruction for the pt to stop the Plavix 5 days before the surgery. Pt advised that Dr Valentina Lucks would like for the pt to substitute the Plavix with the aspirin 81mg . The pt understands. I turned the pt's surgical orders into the surgery scheduling.

## 2012-09-05 ENCOUNTER — Other Ambulatory Visit (INDEPENDENT_AMBULATORY_CARE_PROVIDER_SITE_OTHER): Payer: Self-pay | Admitting: General Surgery

## 2012-09-05 ENCOUNTER — Telehealth (INDEPENDENT_AMBULATORY_CARE_PROVIDER_SITE_OTHER): Payer: Self-pay

## 2012-09-05 NOTE — Telephone Encounter (Signed)
Did I do everything on him to get him scheduled

## 2012-09-05 NOTE — Telephone Encounter (Signed)
I put orders in. I will keep him overnight. Also could you please make sure he is taking his 81 mg asa and know to continue it

## 2012-09-05 NOTE — Telephone Encounter (Signed)
Called pt to make sure that he is aware to stop his Plavix 5 days before the surgery but to continue the Aspirin 81mg  up till the surgery. The pt understands his directions.

## 2012-09-05 NOTE — Telephone Encounter (Signed)
I turned the written orders into surgery scheduling but I still need epic orders in the computer to be done please. Thanks

## 2012-09-09 ENCOUNTER — Encounter (HOSPITAL_COMMUNITY): Payer: Self-pay

## 2012-09-12 ENCOUNTER — Encounter (HOSPITAL_COMMUNITY)
Admission: RE | Admit: 2012-09-12 | Discharge: 2012-09-12 | Disposition: A | Discharge status: Home / Self Care | Payer: Medicare Other | Source: Ambulatory Visit | Attending: General Surgery | Admitting: General Surgery

## 2012-09-12 ENCOUNTER — Encounter (HOSPITAL_COMMUNITY): Payer: Self-pay

## 2012-09-12 HISTORY — DX: Unspecified osteoarthritis, unspecified site: M19.90

## 2012-09-12 LAB — CBC WITH DIFFERENTIAL/PLATELET
Basophils Relative: 0 % (ref 0–1)
Eosinophils Absolute: 0.1 10*3/uL (ref 0.0–0.7)
Eosinophils Relative: 2 % (ref 0–5)
HCT: 37.5 % — ABNORMAL LOW (ref 39.0–52.0)
Hemoglobin: 13.6 g/dL (ref 13.0–17.0)
Lymphs Abs: 1.8 10*3/uL (ref 0.7–4.0)
MCH: 32.2 pg (ref 26.0–34.0)
MCHC: 36.3 g/dL — ABNORMAL HIGH (ref 30.0–36.0)
MCV: 88.9 fL (ref 78.0–100.0)
Monocytes Absolute: 0.4 10*3/uL (ref 0.1–1.0)
Monocytes Relative: 6 % (ref 3–12)
Neutrophils Relative %: 69 % (ref 43–77)
RBC: 4.22 MIL/uL (ref 4.22–5.81)

## 2012-09-12 LAB — BASIC METABOLIC PANEL
BUN: 19 mg/dL (ref 6–23)
CO2: 30 mEq/L (ref 19–32)
Chloride: 100 mEq/L (ref 96–112)
Glucose, Bld: 152 mg/dL — ABNORMAL HIGH (ref 70–99)
Potassium: 3.5 mEq/L (ref 3.5–5.1)
Sodium: 139 mEq/L (ref 135–145)

## 2012-09-12 LAB — SURGICAL PCR SCREEN: Staphylococcus aureus: NEGATIVE

## 2012-09-12 NOTE — Progress Notes (Signed)
Echo 13, cxr, ekg 9/13,med clearance note  4.14

## 2012-09-12 NOTE — Pre-Procedure Instructions (Addendum)
ULES [facility]  09/12/2012   Your procedure is scheduled on:  5.8.14  Report to [facility] at1200 AM.  Call this number if you have problems the morning of surgery: [phone]   Remember:   Do not eat food or drink liquids after midnight.   Take these medicines the morning of surgery with A SIP OF WATER: protonix,  STOP plavix per dr   Drucilla Schmidt not wear jewelry, make-up or nail polish.  Do not wear lotions, powders, or perfumes. You may wear deodorant.  Do not shave 48 hours prior to surgery. Men may shave face and neck.  Do not bring valuables to the hospital.  Contacts, dentures or bridgework may not be worn into surgery.  Leave suitcase in the car. After surgery it may be brought to your room.  For patients admitted to the hospital, checkout time is 11:00 AM the day of  discharge.   Patients discharged the day of surgery will not be allowed to drive  home.  Name and phone number of your driver   Special Instructions: Shower using CHG 2 nights before surgery and the night before surgery.  If you shower the day of surgery use CHG.  Use special wash - you have one bottle of CHG for all showers.  You should use approximately 1/3 of the bottle for each shower.   Please read over the following fact sheets that you were given: Pain Booklet, Coughing and Deep Breathing, MRSA Information and Surgical Site Infection Prevention

## 2012-09-12 NOTE — Progress Notes (Signed)
09/12/12 1429  OBSTRUCTIVE SLEEP APNEA  Score 4 or greater  Results sent to PCP

## 2012-09-14 MED ORDER — CEFAZOLIN SODIUM-DEXTROSE 2-3 GM-% IV SOLR
2.0000 g | INTRAVENOUS | Status: AC
  Administered 2012-09-15: 2 g via INTRAVENOUS
  Filled 2012-09-14: qty 50

## 2012-09-15 ENCOUNTER — Encounter (HOSPITAL_COMMUNITY): Payer: Self-pay | Admitting: *Deleted

## 2012-09-15 ENCOUNTER — Encounter (HOSPITAL_COMMUNITY)
Admission: RE | Disposition: A | Discharge status: Home / Self Care | Payer: Self-pay | Source: Ambulatory Visit | Attending: General Surgery

## 2012-09-15 ENCOUNTER — Ambulatory Visit (HOSPITAL_COMMUNITY): Payer: Medicare Other | Admitting: Anesthesiology

## 2012-09-15 ENCOUNTER — Encounter (HOSPITAL_COMMUNITY): Payer: Self-pay | Admitting: Anesthesiology

## 2012-09-15 ENCOUNTER — Observation Stay (HOSPITAL_COMMUNITY)
Admission: RE | Admit: 2012-09-15 | Discharge: 2012-09-17 | Disposition: A | Discharge status: Home / Self Care | Payer: Medicare Other | Source: Ambulatory Visit | Attending: General Surgery | Admitting: General Surgery

## 2012-09-15 DIAGNOSIS — K4020 Bilateral inguinal hernia, without obstruction or gangrene, not specified as recurrent: Secondary | ICD-10-CM

## 2012-09-15 DIAGNOSIS — K4090 Unilateral inguinal hernia, without obstruction or gangrene, not specified as recurrent: Secondary | ICD-10-CM | POA: Insufficient documentation

## 2012-09-15 DIAGNOSIS — K5900 Constipation, unspecified: Secondary | ICD-10-CM | POA: Insufficient documentation

## 2012-09-15 DIAGNOSIS — M810 Age-related osteoporosis without current pathological fracture: Secondary | ICD-10-CM | POA: Insufficient documentation

## 2012-09-15 DIAGNOSIS — K219 Gastro-esophageal reflux disease without esophagitis: Secondary | ICD-10-CM | POA: Insufficient documentation

## 2012-09-15 DIAGNOSIS — Z79899 Other long term (current) drug therapy: Secondary | ICD-10-CM | POA: Insufficient documentation

## 2012-09-15 DIAGNOSIS — K4030 Unilateral inguinal hernia, with obstruction, without gangrene, not specified as recurrent: Principal | ICD-10-CM | POA: Insufficient documentation

## 2012-09-15 DIAGNOSIS — R339 Retention of urine, unspecified: Secondary | ICD-10-CM | POA: Insufficient documentation

## 2012-09-15 DIAGNOSIS — I2510 Atherosclerotic heart disease of native coronary artery without angina pectoris: Secondary | ICD-10-CM | POA: Insufficient documentation

## 2012-09-15 DIAGNOSIS — Z8673 Personal history of transient ischemic attack (TIA), and cerebral infarction without residual deficits: Secondary | ICD-10-CM | POA: Insufficient documentation

## 2012-09-15 DIAGNOSIS — I10 Essential (primary) hypertension: Secondary | ICD-10-CM | POA: Insufficient documentation

## 2012-09-15 HISTORY — PX: INGUINAL HERNIA REPAIR: SHX194

## 2012-09-15 HISTORY — DX: Pure hypercholesterolemia, unspecified: E78.00

## 2012-09-15 HISTORY — DX: Pneumonia, unspecified organism: J18.9

## 2012-09-15 HISTORY — PX: INSERTION OF MESH: SHX5868

## 2012-09-15 HISTORY — PX: INGUINAL HERNIA REPAIR: SUR1180

## 2012-09-15 SURGERY — REPAIR, HERNIA, INGUINAL, BILATERAL, ADULT
Anesthesia: General | Site: Groin | Laterality: Bilateral | Wound class: Clean

## 2012-09-15 MED ORDER — BUPIVACAINE HCL 0.25 % IJ SOLN
INTRAMUSCULAR | Status: DC | PRN
  Administered 2012-09-15: 30 mL

## 2012-09-15 MED ORDER — 0.9 % SODIUM CHLORIDE (POUR BTL) OPTIME
TOPICAL | Status: DC | PRN
  Administered 2012-09-15: 1000 mL

## 2012-09-15 MED ORDER — GLYCOPYRROLATE 0.2 MG/ML IJ SOLN
INTRAMUSCULAR | Status: DC | PRN
  Administered 2012-09-15: .4 mg via INTRAVENOUS

## 2012-09-15 MED ORDER — ONDANSETRON HCL 4 MG/2ML IJ SOLN: 4.0000 mg | Freq: Once | INTRAMUSCULAR | Status: DC | PRN

## 2012-09-15 MED ORDER — ROCURONIUM BROMIDE 100 MG/10ML IV SOLN
INTRAVENOUS | Status: DC | PRN
  Administered 2012-09-15: 40 mg via INTRAVENOUS
  Administered 2012-09-15: 10 mg via INTRAVENOUS

## 2012-09-15 MED ORDER — PHENYLEPHRINE HCL 10 MG/ML IJ SOLN
INTRAMUSCULAR | Status: DC | PRN
  Administered 2012-09-15 (×2): 80 ug via INTRAVENOUS
  Administered 2012-09-15: 40 ug via INTRAVENOUS
  Administered 2012-09-15: 80 ug via INTRAVENOUS
  Administered 2012-09-15: 40 ug via INTRAVENOUS

## 2012-09-15 MED ORDER — ACETAMINOPHEN 650 MG RE SUPP
650.0000 mg | Freq: Four times a day (QID) | RECTAL | Status: DC | PRN
  Filled 2012-09-15: qty 1

## 2012-09-15 MED ORDER — LISINOPRIL-HYDROCHLOROTHIAZIDE 20-25 MG PO TABS: 1.0000 | ORAL_TABLET | Freq: Every day | ORAL | Status: DC

## 2012-09-15 MED ORDER — PANTOPRAZOLE SODIUM 40 MG PO TBEC
40.0000 mg | DELAYED_RELEASE_TABLET | Freq: Every day | ORAL | Status: DC
  Administered 2012-09-15 – 2012-09-17 (×3): 40 mg via ORAL
  Filled 2012-09-15: qty 1

## 2012-09-15 MED ORDER — ONDANSETRON HCL 4 MG/2ML IJ SOLN
INTRAMUSCULAR | Status: DC | PRN
  Administered 2012-09-15: 4 mg via INTRAVENOUS

## 2012-09-15 MED ORDER — ACETAMINOPHEN 325 MG PO TABS
650.0000 mg | ORAL_TABLET | Freq: Four times a day (QID) | ORAL | Status: DC | PRN
  Filled 2012-09-15: qty 2

## 2012-09-15 MED ORDER — LIDOCAINE HCL 4 % MT SOLN
OROMUCOSAL | Status: DC | PRN
  Administered 2012-09-15: 4 mL via TOPICAL

## 2012-09-15 MED ORDER — ONDANSETRON HCL 4 MG/2ML IJ SOLN
4.0000 mg | Freq: Four times a day (QID) | INTRAMUSCULAR | Status: DC | PRN
  Filled 2012-09-15: qty 2

## 2012-09-15 MED ORDER — NEOSTIGMINE METHYLSULFATE 1 MG/ML IJ SOLN
INTRAMUSCULAR | Status: DC | PRN
  Administered 2012-09-15: 3 mg via INTRAVENOUS

## 2012-09-15 MED ORDER — SODIUM CHLORIDE 0.9 % IV SOLN
INTRAVENOUS | Status: DC
  Administered 2012-09-16 (×2): via INTRAVENOUS

## 2012-09-15 MED ORDER — HYDROMORPHONE HCL PF 1 MG/ML IJ SOLN
INTRAMUSCULAR | Status: AC
  Filled 2012-09-15: qty 1

## 2012-09-15 MED ORDER — OXYCODONE HCL 5 MG PO TABS
5.0000 mg | ORAL_TABLET | ORAL | Status: DC | PRN
  Administered 2012-09-15 – 2012-09-16 (×4): 5 mg via ORAL
  Filled 2012-09-15 (×4): qty 1

## 2012-09-15 MED ORDER — LISINOPRIL 20 MG PO TABS
20.0000 mg | ORAL_TABLET | Freq: Every day | ORAL | Status: DC
  Administered 2012-09-15 – 2012-09-17 (×3): 20 mg via ORAL
  Filled 2012-09-15 (×3): qty 1

## 2012-09-15 MED ORDER — HYDROCHLOROTHIAZIDE 25 MG PO TABS
25.0000 mg | ORAL_TABLET | Freq: Every day | ORAL | Status: DC
  Administered 2012-09-15 – 2012-09-17 (×3): 25 mg via ORAL
  Filled 2012-09-15 (×3): qty 1

## 2012-09-15 MED ORDER — OXYCODONE HCL 5 MG/5ML PO SOLN
ORAL | Status: AC
  Administered 2012-09-15: 5 mg
  Filled 2012-09-15: qty 5

## 2012-09-15 MED ORDER — MORPHINE SULFATE 2 MG/ML IJ SOLN
2.0000 mg | INTRAMUSCULAR | Status: DC | PRN
  Administered 2012-09-15 – 2012-09-17 (×2): 2 mg via INTRAVENOUS
  Filled 2012-09-15 (×2): qty 1

## 2012-09-15 MED ORDER — PROPOFOL 10 MG/ML IV BOLUS
INTRAVENOUS | Status: DC | PRN
  Administered 2012-09-15: 130 mg via INTRAVENOUS

## 2012-09-15 MED ORDER — FENTANYL CITRATE 0.05 MG/ML IJ SOLN
INTRAMUSCULAR | Status: DC | PRN
  Administered 2012-09-15: 100 ug via INTRAVENOUS
  Administered 2012-09-15 (×3): 50 ug via INTRAVENOUS

## 2012-09-15 MED ORDER — ASPIRIN 81 MG PO CHEW
81.0000 mg | CHEWABLE_TABLET | Freq: Every day | ORAL | Status: DC
  Administered 2012-09-16 – 2012-09-17 (×2): 81 mg via ORAL
  Filled 2012-09-15 (×2): qty 1

## 2012-09-15 MED ORDER — LIDOCAINE HCL (CARDIAC) 20 MG/ML IV SOLN
INTRAVENOUS | Status: DC | PRN
  Administered 2012-09-15: 90 mg via INTRAVENOUS

## 2012-09-15 MED ORDER — HYDROMORPHONE HCL PF 1 MG/ML IJ SOLN
0.2500 mg | INTRAMUSCULAR | Status: DC | PRN
  Administered 2012-09-15: 0.5 mg via INTRAVENOUS
  Administered 2012-09-15 (×2): 0.25 mg via INTRAVENOUS

## 2012-09-15 MED ORDER — LACTATED RINGERS IV SOLN
INTRAVENOUS | Status: DC | PRN
  Administered 2012-09-15: 14:00:00 via INTRAVENOUS

## 2012-09-15 SURGICAL SUPPLY — 47 items
ADH SKN CLS APL DERMABOND .7 (GAUZE/BANDAGES/DRESSINGS) ×2
BLADE SURG 10 STRL SS (BLADE) ×2 IMPLANT
BLADE SURG 15 STRL LF DISP TIS (BLADE) ×1 IMPLANT
BLADE SURG 15 STRL SS (BLADE) ×2
BLADE SURG ROTATE 9660 (MISCELLANEOUS) IMPLANT
CANISTER SUCTION 2500CC (MISCELLANEOUS) IMPLANT
CHLORAPREP W/TINT 26ML (MISCELLANEOUS) ×2 IMPLANT
CLOTH BEACON ORANGE TIMEOUT ST (SAFETY) ×2 IMPLANT
COVER SURGICAL LIGHT HANDLE (MISCELLANEOUS) ×2 IMPLANT
DECANTER SPIKE VIAL GLASS SM (MISCELLANEOUS) IMPLANT
DERMABOND ADVANCED (GAUZE/BANDAGES/DRESSINGS) ×2
DERMABOND ADVANCED .7 DNX12 (GAUZE/BANDAGES/DRESSINGS) ×2 IMPLANT
DRAIN PENROSE 1/2X12 LTX STRL (WOUND CARE) ×1 IMPLANT
DRAPE LAPAROTOMY TRNSV 102X78 (DRAPE) ×2 IMPLANT
ELECT CAUTERY BLADE 6.4 (BLADE) ×2 IMPLANT
ELECT REM PT RETURN 9FT ADLT (ELECTROSURGICAL) ×2
ELECTRODE REM PT RTRN 9FT ADLT (ELECTROSURGICAL) ×1 IMPLANT
GAUZE SPONGE 4X4 16PLY XRAY LF (GAUZE/BANDAGES/DRESSINGS) ×2 IMPLANT
GLOVE BIO SURGEON STRL SZ7 (GLOVE) ×2 IMPLANT
GLOVE BIOGEL PI IND STRL 7.5 (GLOVE) ×1 IMPLANT
GLOVE BIOGEL PI INDICATOR 7.5 (GLOVE) ×2
GOWN STRL NON-REIN LRG LVL3 (GOWN DISPOSABLE) ×4 IMPLANT
KIT BASIN OR (CUSTOM PROCEDURE TRAY) ×2 IMPLANT
KIT ROOM TURNOVER OR (KITS) ×2 IMPLANT
MESH HERNIA SYS ULTRAPRO LRG (Mesh General) ×2 IMPLANT
NDL HYPO 25GX1X1/2 BEV (NEEDLE) ×1 IMPLANT
NEEDLE HYPO 25GX1X1/2 BEV (NEEDLE) ×2 IMPLANT
NS IRRIG 1000ML POUR BTL (IV SOLUTION) ×2 IMPLANT
PACK SURGICAL SETUP 50X90 (CUSTOM PROCEDURE TRAY) ×2 IMPLANT
PAD ARMBOARD 7.5X6 YLW CONV (MISCELLANEOUS) ×2 IMPLANT
PENCIL BUTTON HOLSTER BLD 10FT (ELECTRODE) ×2 IMPLANT
SPONGE LAP 18X18 X RAY DECT (DISPOSABLE) ×2 IMPLANT
SUT MNCRL AB 4-0 PS2 18 (SUTURE) ×4 IMPLANT
SUT PROLENE 2 0 CT2 30 (SUTURE) ×8 IMPLANT
SUT VIC AB 0 CT1 27 (SUTURE) ×8
SUT VIC AB 0 CT1 27XBRD ANBCTR (SUTURE) IMPLANT
SUT VIC AB 2-0 CT1 27 (SUTURE) ×24
SUT VIC AB 2-0 CT1 TAPERPNT 27 (SUTURE) ×2 IMPLANT
SUT VIC AB 3-0 SH 27 (SUTURE) ×4
SUT VIC AB 3-0 SH 27X BRD (SUTURE) IMPLANT
SUT VIC AB 3-0 SH 27XBRD (SUTURE) ×2 IMPLANT
SUT VICRYL AB 2 0 TIES (SUTURE) ×1 IMPLANT
SYR CONTROL 10ML LL (SYRINGE) ×2 IMPLANT
TOWEL OR 17X24 6PK STRL BLUE (TOWEL DISPOSABLE) ×2 IMPLANT
TOWEL OR 17X26 10 PK STRL BLUE (TOWEL DISPOSABLE) ×2 IMPLANT
TUBE CONNECTING 12X1/4 (SUCTIONS) ×1 IMPLANT
YANKAUER SUCT BULB TIP NO VENT (SUCTIONS) ×1 IMPLANT

## 2012-09-15 NOTE — Transfer of Care (Signed)
Immediate Anesthesia Transfer of Care Note  Patient: Mark Joseph  Procedure(s) Performed: Procedure(s): HERNIA REPAIR INGUINAL ADULT BILATERAL (Bilateral) INSERTION OF MESH (Bilateral)  Patient Location: PACU  Anesthesia Type:General  Level of Consciousness: oriented, sedated, patient cooperative and responds to stimulation  Airway & Oxygen Therapy: Patient Spontanous Breathing and Patient connected to face mask oxygen  Post-op Assessment: Report given to PACU RN, Post -op Vital signs reviewed and stable, Patient moving all extremities and Patient moving all extremities X 4  Post vital signs: Reviewed and stable  Complications: No apparent anesthesia complications

## 2012-09-15 NOTE — Anesthesia Preprocedure Evaluation (Signed)
Anesthesia Evaluation  Patient identified by MRN, date of birth, ID band Patient awake    Reviewed: Allergy & Precautions, H&P , NPO status , Patient's Chart, lab work & pertinent test results  Airway       Dental   Pulmonary former smoker,          Cardiovascular hypertension, Rhythm:regular Rate:Normal     Neuro/Psych CVA    GI/Hepatic GERD-  ,  Endo/Other    Renal/GU      Musculoskeletal   Abdominal   Peds  Hematology   Anesthesia Other Findings   Reproductive/Obstetrics                           Anesthesia Physical Anesthesia Plan  ASA: III  Anesthesia Plan: General   Post-op Pain Management:    Induction: Intravenous  Airway Management Planned: Oral ETT and LMA  Additional Equipment:   Intra-op Plan:   Post-operative Plan: Extubation in OR  Informed Consent: I have reviewed the patients History and Physical, chart, labs and discussed the procedure including the risks, benefits and alternatives for the proposed anesthesia with the patient or authorized representative who has indicated his/her understanding and acceptance.     Plan Discussed with: CRNA, Anesthesiologist and Surgeon  Anesthesia Plan Comments:         Anesthesia Quick Evaluation

## 2012-09-15 NOTE — H&P (View-Only) (Signed)
Patient ID: Mark Joseph, male   DOB: 04/30/1922, 79 y.o.   MRN: [id number]  Chief Complaint  Patient presents with  . New Evaluation    eval bil ing hernia    HPI Mark Joseph is a 79 y.o. male.  Referred by Dr Griffin HPI This is an 82-year-old male who lives at home with his 85-year-old wife. They both are still very functional. Both have been participating in exercise programs until recently. He's had trouble with his knees as well as a recent onset of a very large right groin hernia. He is a long-standing left inguinal hernia for which has not really changed and does not have a lot of symptoms. The right side is gone considerably bigger and does not ever reduce now. This is causing to decrease his activity and is giving him a fair amount of discomfort now as well. He has no real changes in his bowel movements and no real change in urination. He did have a small stroke in  September of 2013 with some residual speech deficits. Past Medical History  Diagnosis Date  . Hypertension   . GERD (gastroesophageal reflux disease)   . Osteoporosis   . Stroke 01/2012    Past Surgical History  Procedure Laterality Date  . Appendectomy      60 years ago.    History reviewed. No pertinent family history.  Social History History  Substance Use Topics  . Smoking status: Former Smoker    Types: Cigarettes    Quit date: 05/11/1968  . Smokeless tobacco: Never Used  . Alcohol Use: 1.2 oz/week    2 Glasses of wine per week     Comment: Daily.    No Known Allergies  Current Outpatient Prescriptions  Medication Sig Dispense Refill  . alendronate (FOSAMAX) 70 MG tablet Take 70 mg by mouth every 7 (seven) days. Take on Sundays. Take with a full glass of water on an empty stomach.      . CALCIUM PO Take 1 tablet by mouth 2 (two) times daily.      . clopidogrel (PLAVIX) 75 MG tablet Take 1 tablet (75 mg total) by mouth daily with breakfast.  30 tablet  11  . lisinopril-hydrochlorothiazide  (PRINZIDE,ZESTORETIC) 20-25 MG per tablet Take 1 tablet by mouth daily.      . pantoprazole (PROTONIX) 40 MG tablet Take 40 mg by mouth daily.      . pravastatin (PRAVACHOL) 20 MG tablet Take 1 tablet (20 mg total) by mouth daily.  30 tablet  11   No current facility-administered medications for this visit.    Review of Systems Review of Systems  Constitutional: Negative for fever, chills and unexpected weight change.  HENT: Negative for hearing loss, congestion, sore throat, trouble swallowing and voice change.   Eyes: Negative for visual disturbance.  Respiratory: Negative for cough and wheezing.   Cardiovascular: Negative for chest pain, palpitations and leg swelling.  Gastrointestinal: Negative for nausea, vomiting, abdominal pain, diarrhea, constipation, blood in stool, abdominal distention, anal bleeding and rectal pain.  Genitourinary: Negative for hematuria and difficulty urinating.  Musculoskeletal: Negative for arthralgias.  Skin: Negative for rash and wound.  Neurological: Negative for seizures, syncope, weakness and headaches.  Hematological: Negative for adenopathy. Bruises/bleeds easily.  Psychiatric/Behavioral: Negative for confusion.    Blood pressure 110/78, pulse 70, temperature 97.6 F (36.4 C), temperature source Temporal, resp. rate 18, height 5' 11" (1.803 m), weight 154 lb 9.6 oz (70.126 kg).  Physical Exam Physical Exam    Vitals reviewed. Constitutional: He appears well-developed and well-nourished.  Cardiovascular: Normal rate, regular rhythm and normal heart sounds.   Pulmonary/Chest: Effort normal and breath sounds normal.  Abdominal: Soft. Bowel sounds are normal. There is no tenderness. A hernia is present. Hernia confirmed positive in the right inguinal area (large nonreducible nontender right inguinal hernia with what appears to be bowel present) and confirmed positive in the left inguinal area (small reducible lih).    Data Reviewed Dr Griffins  notes  Assessment    Bilateral IH     Plan    I think he at least needs the right side repaired. This is very symptomatic to him and it is decreasing his activity. I think this is going to need to have to be done in open fashion. I don't think further released this is amenable to a laparoscopic repair. I am really unable even to reduce it in the office today. I discussed a right inguinal hernia repair with mesh. I think if we are going to take to risk of putting him to sleep under general anesthesia I think a repair of the left side would be reasonable at the same time. My biggest concern with him is his recent stroke and the fact that he is on Plavix. I would be willing to leave him on a baby aspirin through his surgery although there would be a slightly higher risk of having a hematoma. He is due to see Dr. Griffin tomorrow and we can come up with a plan to do this safely. I discussed observation with him in the possible risks and benefits of that but he does not wish to pursue this now. We discussed observation versus repair.  We discussed both laparoscopic and open inguinal hernia repairs. I described the procedure in detail.  The patient was given educational material.  Goals should be achieved with surgery. We discussed the usage of mesh and the rationale behind that. We went over the pathophysiology of inguinal hernia. We have elected to perform open inguinal hernia repair with mesh.  We discussed the risks including bleeding, infection, recurrence, postoperative pain and chronic groin pain, testicular injury, urinary retention, numbness in groin and around incision.          Clarissa Laird 08/30/2012, 4:58 PM    

## 2012-09-15 NOTE — Op Note (Signed)
Preoperative diagnoses: #1 right scrotal hernia #2 left inguinal hernia Postoperative diagnosis: Same as above Procedure: Bilateral inguinal hernia repairs with UltraPro hernia system Surgeon: Dr. Harden Mo Asst.: None Anesthesia: Gen. Estimated blood loss: Minimal Specimens: None Drains: None Complications: None Sponge needle count correct x2 at operation Disposition to recovery in stable condition  Indications: This is an 79 year old male with a history of an increasingly tender and swollen right groin hernia. This area has now begun to descend down into his scrotum. I saw him in the office and had a very symptomatic right groin hernia as well as a mildly symptomatic left groin hernia. I talked to him about different repairs I think he would be best repaied in an open fashion due to the scrotal hernia so he and I discussed bilateral open repairs and we'll plan on keeping him overnight.  Procedure:  After informed consent was obtained, patient was taken to the operating room.  He was given 2 grams of iv cefazolin.  SCDs were placed.  His groins were prepped and draped in the standard sterile surgical fashion.  A surgical timeout was performed.  I first approached the larger right sided scrotal hernia.  I infiltrated this side with marcaine and performed an ilioinguinal nerve block.  I made a right groin incision.  I carried this to the external oblique. I entered this sharply through the external ring.  He was noted to have a fair amount of bowel incarcerated in the scrotum. I encircled the spermatic cord with a Penrose drain. I then separated the indirect hernia sac which contained the bowel from the remaining cord structures. I then reduce this in its entirety into the abdomen. He did not have a true direct hernia but had a weak floor. I then developed the preperitoneal space with a Ray-Tec sponge. I then inserted an UltraPro hernia system and laid the bottom portion of the bilayer flap. I  then closed the internal oblique down to the shelving edge. This was done with 2-0 Vicryl. I then opened the top portion the bilayer and laid flat. I made a cut around this and wrapped it around the spermatic cord. I sutured this with 2-0 Vicryl to the pubic tubercle and every half centimeter to the inguinal ligament. I sutured this to the internal oblique with 2-0 Vicryl superiorly. I laid the lateral portion under the external oblique. This completely obliterated the defect and provided good coverage of the entire floor. Hemostasis was observed. I then closed the external oblique with 2-0 Vicryl. Scarpa's was closed with  3-0 Vicryl. The skin was closed with Monocryl. I then took my attention to the left side.  I infiltrated Marcaine throughout the left side and performed an ilioinguinal nerve block again. I then made incisions left groin. I did this in a similar fashion under the external oblique. I encircled the spermatic cord with a Penrose drain. He had a large direct hernia on the left side. I then entered the preperitoneal space and reduced this in its entirety. I then placed a similar large UltraPro hernia system in this space and close down the internal oblique again. I laid the top portion flat. I wrapped this around the spermatic cord. I sutured into position just like the other side with 2-0 Vicryl suture. This again completely obliterated the defect. Hemostasis was observed. I closed in a similar fashion. Dermabond was placed over both of these. He tolerated this well as extubated and transferred to recovery in stable condition.

## 2012-09-15 NOTE — Progress Notes (Signed)
Abdominal swelling unchanged from adm. O2 sats 95-99%.

## 2012-09-15 NOTE — Progress Notes (Signed)
Pt says he feels like "something is caught in the back of my throat". Has strong, dry cough, O2 sats 95-100%, lungs clear. Back of right side of throat appears a bit reddened. Tol.  po ice chips & water well.  Dr Jacklynn Bue notified. No new orders. Will monitor a bit longer. Pt reassured and explanation about breathing tube irritation in throat post op.

## 2012-09-15 NOTE — Preoperative (Signed)
Beta Blockers   Reason not to administer Beta Blockers:Not Applicable 

## 2012-09-15 NOTE — Progress Notes (Signed)
Soft area of sl. swelling noted at abdomen, just below umbilicus and above bilat surg sites. Dr Dwain Sarna notified & aware.  No new orders. Will cont to monitor.

## 2012-09-15 NOTE — Anesthesia Procedure Notes (Signed)
Procedure Name: Intubation Date/Time: 09/15/2012 1:50 PM Performed by: Sharlene Dory E Pre-anesthesia Checklist: Patient identified, Emergency Drugs available, Suction available, Patient being monitored and Timeout performed Patient Re-evaluated:Patient Re-evaluated prior to inductionOxygen Delivery Method: Circle system utilized Preoxygenation: Pre-oxygenation with 100% oxygen Intubation Type: IV induction Ventilation: Mask ventilation without difficulty Laryngoscope Size: Mac and 3 Grade View: Grade I Tube type: Oral Tube size: 7.5 mm Number of attempts: 1 Airway Equipment and Method: Stylet and LTA kit utilized Placement Confirmation: ETT inserted through vocal cords under direct vision,  positive ETCO2 and breath sounds checked- equal and bilateral Secured at: 23 cm Tube secured with: Tape Dental Injury: Teeth and Oropharynx as per pre-operative assessment

## 2012-09-15 NOTE — Interval H&P Note (Signed)
History and Physical Interval Note:  09/15/2012 1:33 PM  Mark Joseph  has presented today for surgery, with the diagnosis of bilateral inguinal hernia  The various methods of treatment have been discussed with the patient and family. After consideration of risks, benefits and other options for treatment, the patient has consented to  Procedure(s): HERNIA REPAIR INGUINAL ADULT BILATERAL (Bilateral) INSERTION OF MESH (Bilateral) as a surgical intervention .  The patient's history has been reviewed, patient examined, no change in status, stable for surgery.  I have reviewed the patient's chart and labs.  Questions were answered to the patient's satisfaction.     Abri Vacca

## 2012-09-15 NOTE — Anesthesia Postprocedure Evaluation (Signed)
  Anesthesia Post-op Note  Patient: Mark Joseph  Procedure(s) Performed: Procedure(s): HERNIA REPAIR INGUINAL ADULT BILATERAL (Bilateral) INSERTION OF MESH (Bilateral)  Patient Location: PACU  Anesthesia Type:General  Level of Consciousness: awake and alert   Airway and Oxygen Therapy: Patient Spontanous Breathing  Post-op Pain: mild  Post-op Assessment: Post-op Vital signs reviewed, Patient's Cardiovascular Status Stable and Respiratory Function Stable  Post-op Vital Signs: stable  Complications: No apparent anesthesia complications c/o of sore throat in Pacu

## 2012-09-16 ENCOUNTER — Encounter (HOSPITAL_COMMUNITY): Payer: Self-pay | Admitting: General Surgery

## 2012-09-16 MED ORDER — TAMSULOSIN HCL 0.4 MG PO CAPS
0.4000 mg | ORAL_CAPSULE | Freq: Every day | ORAL | Status: DC
  Administered 2012-09-16 – 2012-09-17 (×2): 0.4 mg via ORAL
  Filled 2012-09-16 (×2): qty 1

## 2012-09-16 MED ORDER — DOCUSATE SODIUM 100 MG PO CAPS
100.0000 mg | ORAL_CAPSULE | Freq: Two times a day (BID) | ORAL | Status: DC
  Administered 2012-09-16 – 2012-09-17 (×3): 100 mg via ORAL
  Filled 2012-09-16 (×3): qty 1

## 2012-09-16 MED ORDER — CLOPIDOGREL BISULFATE 75 MG PO TABS
75.0000 mg | ORAL_TABLET | Freq: Every day | ORAL | Status: DC
  Administered 2012-09-17: 75 mg via ORAL
  Filled 2012-09-16: qty 1

## 2012-09-16 NOTE — Progress Notes (Signed)
Pt unable to void. Bladder distended. Foley placed as ordered.550cc of urine returns noted.

## 2012-09-16 NOTE — Progress Notes (Signed)
1 Day Post-Op  Subjective: Couldn't urinate and had foley placed last night, feels fine this am, tol diet, some pain controlled with oral meds, not oob yet  Objective: Vital signs in last 24 hours: Temp:  [97.5 F (36.4 C)-98.8 F (37.1 C)] 98.8 F (37.1 C) (05/09 0525) Pulse Rate:  [60-78] 60 (05/09 0525) Resp:  [12-20] 18 (05/09 0525) BP: (131-183)/(72-100) 135/75 mmHg (05/09 0525) SpO2:  [94 %-100 %] 97 % (05/09 0525) Weight:  [155 lb 9.6 oz (70.58 kg)] 155 lb 9.6 oz (70.58 kg) (05/09 0100) Last BM Date: 09/14/12  Intake/Output from previous day: 05/08 0701 - 05/09 0700 In: 2355 [P.O.:420; I.V.:1935] Out: 1250 [Urine:1250] Intake/Output this shift:    General appearance: no distress Resp: clear to auscultation bilaterally Cardio: regular rate and rhythm GI: soft, bs present, he has some lower abdominal bloating that I think is just his normal abdominal wall that was a concern overnight from nursing, nontender, groins are approp tender, wounds are clean without infection  Assessment/Plan: POD 1 BIH repair 1. Cont po pain control with iv backup 2. oob today, pulm toilet, will have pt evaluate for home recs 3. He had large rih especially with bowel in scrotum and difficult repair which I think accounts for exam now and pain  4. Restart plavix today 5. Continue foley for 24 hours and will dc in am, hopefully home tomorrow [facility] 09/16/2012

## 2012-09-16 NOTE — Evaluation (Signed)
Physical Therapy Evaluation Patient Details Name: Mark Joseph MRN: [id number] DOB: [birth year] Today's Date: 09/16/2012 Time: 4098-1191 PT Time Calculation (min): 31 min  PT Assessment / Plan / Recommendation Clinical Impression  pt s/p bil ventral/inguinal hernia repairs.  Mobility steady, but guarded with decr activity tolerance. Did not get to address stairs today.  Will assess for any further needs 5/10 before D/C    PT Assessment  Patient needs continued PT services    Follow Up Recommendations  Home health PT;No PT follow up;Other (comment) (likely will need no follow up)    Does the patient have the potential to tolerate intense rehabilitation      Barriers to Discharge None      Equipment Recommendations  None recommended by PT    Recommendations for Other Services     Frequency Min 1X/week    Precautions / Restrictions Precautions Precautions: None   Pertinent Vitals/Pain       Mobility  Bed Mobility Bed Mobility: Not assessed Transfers Transfers: Sit to Stand;Stand to Sit Sit to Stand: 5: Supervision;With upper extremity assist;From chair/3-in-1 Stand to Sit: 5: Supervision;To chair/3-in-1 Details for Transfer Assistance: safe mobility Ambulation/Gait Ambulation/Gait Assistance: 4: Min guard;5: Supervision Ambulation Distance (Feet): 250 Feet Assistive device: Rolling walker Ambulation/Gait Assistance Details: steady and slow with mild stoop Gait Pattern: Step-through pattern Stairs: No    Exercises     PT Diagnosis: Generalized weakness  PT Problem List: Decreased strength;Decreased activity tolerance;Pain PT Treatment Interventions: Gait training;Stair training;Functional mobility training;Therapeutic activities;Patient/family education   PT Goals Acute Rehab PT Goals PT Goal Formulation: With patient Time For Goal Achievement: 09/23/12 Potential to Achieve Goals: Good Pt will go Supine/Side to Sit: with modified independence PT Goal:  Supine/Side to Sit - Progress: Goal set today Pt will Ambulate: >150 feet;with supervision PT Goal: Ambulate - Progress: Goal set today Pt will Go Up / Down Stairs: 1-2 stairs;with supervision;with min assist;Other (comment) (no rails) PT Goal: Up/Down Stairs - Progress: Goal set today  Visit Information  Last PT Received On: 09/16/12 Assistance Needed: +1    Subjective Data  Subjective: It hurts to stand in a good posture Patient Stated Goal: Home independent   Prior Functioning  Home Living Lives With: Spouse Available Help at Discharge: Family (son home this weekend) Type of Home: House Home Access: Stairs to enter [employer] of Steps: 2 Entrance Stairs-Rails: None Home Layout: Two level;Able to live on main level with bedroom/bathroom Bathroom Shower/Tub: Engineer, manufacturing systems: Standard Home Adaptive Equipment: Bedside commode/3-in-1;Straight cane;Shower chair with back;Walker - rolling Prior Function Level of Independence: Independent Able to Take Stairs?: Yes Driving: Yes Communication Communication: No difficulties    Cognition  Cognition Arousal/Alertness: Awake/alert Overall Cognitive Status: Within Functional Limits for tasks assessed    Extremity/Trunk Assessment Right Upper Extremity Assessment RUE ROM/Strength/Tone: Within functional levels Left Upper Extremity Assessment LUE ROM/Strength/Tone: Within functional levels Right Lower Extremity Assessment RLE ROM/Strength/Tone: Deficits RLE ROM/Strength/Tone Deficits: quads weak at 3 to 3+/5 with pain Left Lower Extremity Assessment LLE ROM/Strength/Tone: Within functional levels   Balance Balance Balance Assessed: No  End of Session PT - End of Session Activity Tolerance: Patient tolerated treatment well Patient left: in chair;with call bell/phone within reach;with family/visitor present Nurse Communication: Mobility status  GP Functional Assessment Tool Used: clinical  judgement Functional Limitation: Mobility: Walking and moving around Mobility: Walking and Moving Around Current Status (Y7829): At least 1 percent but less than 20 percent impaired, limited or restricted Mobility: Walking and Moving  Around Goal Status [phone]): At least 1 percent but less than 20 percent impaired, limited or restricted   Berdell Hostetler, Eliseo Gum 09/16/2012, 1:32 PM 09/16/2012  Sawyer Bing, PT [phone]  (pager)

## 2012-09-17 LAB — BASIC METABOLIC PANEL
BUN: 13 mg/dL (ref 6–23)
CO2: 28 mEq/L (ref 19–32)
Calcium: 8.5 mg/dL (ref 8.4–10.5)
Creatinine, Ser: 0.99 mg/dL (ref 0.50–1.35)
GFR calc non Af Amer: 71 mL/min — ABNORMAL LOW (ref >90)
Glucose, Bld: 128 mg/dL — ABNORMAL HIGH (ref 70–99)

## 2012-09-17 MED ORDER — TAMSULOSIN HCL 0.4 MG PO CAPS: 0.4000 mg | ORAL_CAPSULE | Freq: Every day | ORAL | Status: DC

## 2012-09-17 MED ORDER — OXYCODONE HCL 5 MG PO TABS: 5.0000 mg | ORAL_TABLET | ORAL | Status: DC | PRN

## 2012-09-17 MED ORDER — POLYETHYLENE GLYCOL 3350 17 G PO PACK
17.0000 g | PACK | Freq: Every day | ORAL | Status: DC
  Administered 2012-09-17: 17 g via ORAL
  Filled 2012-09-17: qty 1

## 2012-09-17 NOTE — Progress Notes (Signed)
Pt unable to void 8 hours post urethral catheter removed. Bladder scan performed two hours prior resulted 107 cc at 1300. Pt encouraged increased fluid intake. Dr Derrell Lolling informed and order received to reinsert catheter, d/c patient home. To call office on Monday for an appointment with Dr Dwain Sarna next week on Tuesday or Wednesday. 16 fr catheter inserted at 1545; returned 350 cc amber urine. Tolerated. Pt discharged home with family in stable condition; family instructed on catheter care until seen by Dr Dwain Sarna.

## 2012-09-17 NOTE — Progress Notes (Signed)
2 Days Post-Op  Subjective: Feels pretty well. Pain controlled. Would like to go home if possible. Has not had a bowel movement yet. Foley catheter just removed. On Flomax now.  Objective: Vital signs in last 24 hours: Temp:  [97.9 F (36.6 C)-98.7 F (37.1 C)] 98 F (36.7 C) (05/10 0512) Pulse Rate:  [84-105] 89 (05/10 0512) Resp:  [15-18] 18 (05/10 0512) BP: (95-126)/(59-76) 126/59 mmHg (05/10 0512) SpO2:  [93 %-96 %] 95 % (05/10 0512) Last BM Date: 09/14/12  Intake/Output from previous day: 05/09 0701 - 05/10 0700 In: 728.2 [P.O.:420; I.V.:308.2] Out: 1750 [Urine:1750] Intake/Output this shift:    General appearance: alert. Cooperative. Pleasant. No distress. GI: soft, non-tender; bowel sounds normal; no masses,  no organomegaly Male genitalia: , bilateral groin incisions look fine. Minimal edema. Scrotal ecchymoses but no hematoma.  Lab Results:  Results for orders placed during the hospital encounter of 09/15/12 (from the past 24 hour(s))  BASIC METABOLIC PANEL     Status: Abnormal   Collection Time    09/17/12  5:30 AM      Result Value Range   Sodium 134 (*) 135 - 145 mEq/L   Potassium 3.3 (*) 3.5 - 5.1 mEq/L   Chloride 97  96 - 112 mEq/L   CO2 28  19 - 32 mEq/L   Glucose, Bld 128 (*) 70 - 99 mg/dL   BUN 13  6 - 23 mg/dL   Creatinine, Ser 4.09  0.50 - 1.35 mg/dL   Calcium 8.5  8.4 - 81.1 mg/dL   GFR calc non Af Amer 71 (*) >90 mL/min   GFR calc Af Amer 82 (*) >90 mL/min     Studies/Results: @RISRSLT24 @  . aspirin  81 mg Oral Daily  . clopidogrel  75 mg Oral Q breakfast  . docusate sodium  100 mg Oral BID  . lisinopril  20 mg Oral Daily   And  . hydrochlorothiazide  25 mg Oral Daily  . pantoprazole  40 mg Oral Daily  . polyethylene glycol  17 g Oral Daily  . tamsulosin  0.4 mg Oral Daily     Assessment/Plan: s/p Procedure(s): HERNIA REPAIR INGUINAL ADULT BILATERAL INSERTION OF MESH  POD #2. Bilateral inguinal hernia repair. No wound or surgical  problems. Constipation. We'll start MiraLAX now Urinary retention. Hopefully this will resolve following 36 hours Foley catheter decompression and Flomax. Discharge home today without Foley in for each. Otherwise reinsert Foley and discharge home with followup in office.  @PROBHOSP @  LOS: 2 days    Hayley Horn M. Derrell Lolling, M.D., [facility] General and Minimally invasive Surgery Breast and Colorectal Surgery Office:   [phone] Pager:   [phone]  09/17/2012  . .prob

## 2012-09-19 ENCOUNTER — Telehealth (INDEPENDENT_AMBULATORY_CARE_PROVIDER_SITE_OTHER): Payer: Self-pay

## 2012-09-19 ENCOUNTER — Encounter (INDEPENDENT_AMBULATORY_CARE_PROVIDER_SITE_OTHER): Payer: Self-pay | Admitting: General Surgery

## 2012-09-19 ENCOUNTER — Ambulatory Visit (INDEPENDENT_AMBULATORY_CARE_PROVIDER_SITE_OTHER): Payer: Medicare Other | Admitting: General Surgery

## 2012-09-19 ENCOUNTER — Emergency Department (HOSPITAL_COMMUNITY): Admission: EM | Admit: 2012-09-19 | Discharge: 2012-09-19 | Payer: Medicare Other

## 2012-09-19 VITALS — BP 110/60 | HR 72 | Resp 18 | Ht 71.0 in | Wt 154.0 lb

## 2012-09-19 DIAGNOSIS — Z9889 Other specified postprocedural states: Secondary | ICD-10-CM

## 2012-09-19 DIAGNOSIS — Z09 Encounter for follow-up examination after completed treatment for conditions other than malignant neoplasm: Secondary | ICD-10-CM

## 2012-09-19 NOTE — Telephone Encounter (Signed)
Called pt's home to see about the appt with [facility] but did not get an answer. I then turned around and called Asher Muir back with [facility] to see if she had been in touch with the pt. Asher Muir said she never got an answer from the pt. I explained now I am worried they have went to the ER since they were calling still concerned about the foley but was not getting any directions.

## 2012-09-19 NOTE — Telephone Encounter (Signed)
The pt's wife called inquiring if he can shower with the foley in.  I told her he can.  He also needs to have an appointment to get the foley removed.  Elease Hashimoto will call back with that.

## 2012-09-19 NOTE — Telephone Encounter (Signed)
Patient and his wife have now called again stating that pain now has swelling, redness, blood, and difficulty urinating on his penis.  Wife states patient is very concerned and thinks he needs to be seen immediately.  Explained that we are still awaiting an appt with [facility] but if she feels husband needs to be seen immediately then he would need to be seen in the ED.  Wife states she believes they should be able to be seen by urologist immediately and not have to wait in ED.  Again tried to explain that if patient is experiencing an emergency as wife explained it he would need to be seen in the ED but if something else then he could be seen by urologist as soon as appt is scheduled.  Wife states understanding.

## 2012-09-19 NOTE — Telephone Encounter (Signed)
Mark Joseph again with [facility] b/c I still have not heard from her with the appt time. Asher Muir explained that she is waiting for a cancelation for a am time this week. Asher Muir will call the pt directly once she gets an appt ready. I did notify Dr Dwain Sarna and the pt since they did come to our office today.

## 2012-09-19 NOTE — Telephone Encounter (Signed)
Wife called back to state that she is looking at her HMO book for 2014 for her insurance and it does not list [facility].  Wife wants Elease Hashimoto to let her know if [facility] is covered under her insurance.  Tried to explain to wife that we would not have that information however if she has the book and wants to let me know the list of urologist that are listed as being covered we could try to refer to one of those.  Patient just asked for [facility]'s number and states she will call.  Telephone number given.

## 2012-09-19 NOTE — Progress Notes (Signed)
Subjective:     Patient ID: Mark Joseph, male   DOB: Oct 20, 1921, 79 y.o.   MRN: [id number]  HPI 20 yom now POD 4 from bilateral open IH repairs for scrotal hernia with bowel in scrotum and large LIH also.  Difficult hernia repair that I restarted plavix postop due to stroke risk and did procedure on aspirin.  On first postop night had foley replaced with 550 cc out.  Left foley in for > 24 hours and started flomax (rapaflo not able to be used).  Removed again pod 2 and could not void.  Discharged home with foley.  Today he has compliant that foley is in and penis/scrotum is bruised and swollen.  He had a bm today and is otherwise doing pretty well at home.  Review of Systems     Objective:   Physical Exam Bilateral groin incisions clean without infection, scrotum with ecchymosis, penis ecchymotic and swollen, some right groin hematoma    Assessment:     S/p BIH repair     Plan:     I think this is to be expected from his surgery and overall he is well.  I will have him see Dr Berneice Heinrich of urology later this week to see if he can assist with catheter and I appreciate him seeing Mark Joseph.  Will continue ambulation, stool softener regimen.  I will see back soon also.

## 2012-09-19 NOTE — Telephone Encounter (Signed)
Called pt's wife to advise them that Dr Dwain Sarna wants pt to be seen by urology this week to have the foley cath removed in their office just in case if the pt has anymore problems with urinating. I will call [facility] now to make the referral for the pt to be seen ASAP.  Called [facility] to make appt and triage asked for me to fax the records to attn:Jamie fx#[phone] so they can find an appt for the pt. Asher Muir will call me back with the appt inffo.

## 2012-09-19 NOTE — Telephone Encounter (Signed)
Called [facility] again b/c we have not heard back from their office since this am about trying to get an appt with them for this week. Asher Muir is working on the appt as we speak and she is going to call pt directly since pt is having problmes now with the foley cath. I will call the pt in a few to give [facility] with [facility] time to make the appt.

## 2012-09-19 NOTE — Telephone Encounter (Signed)
Called charge nurse Lequita Halt to see about the pt being checked into the ER and she did confirm the pt being there. I asked if the pt could be notified to come see Dr Dwain Sarna at the office instead of waiting there in the ER. Lequita Halt said she would dismiss the pt and notify them to come directly to the office now to see Dr Dwain Sarna. I advised we would take care of the pt today. I made the appt with Dr Dwain Sarna.   I called Asher Muir w/[facility] about the appt and she is still working on getting the appt for the pt. Asher Muir said she will call me back. I will call her if i don't hear back from her by the time the pt arrives to see Dr Dwain Sarna.

## 2012-09-21 ENCOUNTER — Telehealth (INDEPENDENT_AMBULATORY_CARE_PROVIDER_SITE_OTHER): Payer: Self-pay

## 2012-09-21 NOTE — Telephone Encounter (Signed)
Pt's wife calling b/c pt is c/o of penis being more swollen today w/bruising. I advised the wife that is what happens sometimes after a large hernia repair and since the pt had bilateral ing. Hernia repair this does not surprise me at all. I asked if the pt was using ice on this area and they said no b/c no one told them. I asked the wife she was given post op instruction sheet b/c the ice recommendation would be printed on the instructions. The wife stated she had not looked over the post op instruction sheet but she did see now where it recommends using ice. I told the wife if she had bag of frozen peas in the freezer the pt could use that till they thaw out and then refreeze the peas again to reuse. The wife will try this on the pt. The pt has an appt tomorrow with Dr Berneice Heinrich at [facility]. I will notify Dr Dwain Sarna. The wife understands.

## 2012-09-22 NOTE — Discharge Summary (Signed)
Physician Discharge Summary  Patient ID: Mark Joseph MRN: [id number] DOB/AGE: 09/19/1921 79 y.o.  Admit date: 09/15/2012 Discharge date: 09/22/2012  Admission Diagnoses: Scrotal right inguinal herna, chronically incarcerated Left inguinal hernia Hypertension CAD  Discharge Diagnoses:  Active Problems:   * No active hospital problems. *  Discharged Condition: good  Hospital Course: 79 yom with large bilateral groin hernias.  I recommended an open repair especially for right as this was chronically incarcerated. He underwent bilateral open groin hernia repair with mesh.  The left was large but the right had bowel in his scrotum and was difficult repair.  Postoperatively he did well and diet was advanced.  He required foley catheter placement and then failed removal.  He is on flomax. Was discharged home with catheter in place  Consults: None  Significant Diagnostic Studies: none   Treatments: surgery: bilateral IH repair with mesh  Disposition: home  Discharge Orders   Future Orders Complete By Expires     Call MD for:  difficulty breathing, headache or visual disturbances  As directed     Call MD for:  hives  As directed     Call MD for:  persistant dizziness or light-headedness  As directed     Call MD for:  persistant nausea and vomiting  As directed     Call MD for:  redness, tenderness, or signs of infection (pain, swelling, redness, odor or green/yellow discharge around incision site)  As directed     Call MD for:  severe uncontrolled pain  As directed     Call MD for:  temperature >100.4  As directed     Diet - low sodium heart healthy  As directed     Discharge instructions  As directed     Comments:      Call Dr. Doreen Joseph office on Monday and make an appointment to see him in 2 weeks.  Call our office if he had any trouble urinating.  You will 10 to be constipated. We recommend that she take MiraLAX daily. Drink lots of fluids.       [facility] _______[facility]  UMBILICAL OR INGUINAL HERNIA REPAIR: POST OP INSTRUCTIONS  Always review your discharge instruction sheet given to you by the facility where your surgery was performed. IF YOU HAVE DISABILITY OR FAMILY LEAVE FORMS, YOU MUST BRING THEM TO THE OFFICE FOR PROCESSING.   DO NOT GIVE THEM TO YOUR DOCTOR.  A  prescription for pain medication may be given to you upon discharge.  Take your pain medication as prescribed, if needed.  If narcotic pain medicine is not needed, then you may take acetaminophen (Tylenol) or ibuprofen (Advil) as needed. Take your usually prescribed medications unless otherwise directed. If you need a refill on your pain medication, please contact your pharmacy.  They will contact our office to request authorization. Prescriptions will not be filled after 5 pm or on week-ends. You should follow a light diet the first 24 hours after arrival home, such as soup and crackers, etc.  Be sure to include lots of fluids daily.  Resume your normal diet the day after surgery. Most patients will experience some swelling and bruising around the umbilicus or in the groin and scrotum.  Ice packs and reclining will help.  Swelling and bruising can take several days to resolve.  It is common to experience some constipation if taking pain medication after surgery.  Increasing fluid intake and taking a stool softener (such as Colace) will  usually help or prevent this problem from occurring.  A mild laxative (Milk of Magnesia or Miralax) should be taken according to package directions if there are no bowel movements after 48 hours. Unless discharge instructions indicate otherwise, you may remove your bandages 24-48 hours after surgery, and you may shower at that time.  You may have steri-strips (small skin tapes) in place directly over the incision.  These strips should be left on the skin for 7-10 days.  If your surgeon used skin glue on the incision, you may shower in 24 hours.  The  glue will flake off over the next 2-3 weeks.  Any sutures or staples will be removed at the office during your follow-up visit. ACTIVITIES:  You may resume regular (light) daily activities beginning the next day-such as daily self-care, walking, climbing stairs-gradually increasing activities as tolerated.  You may have sexual intercourse when it is comfortable.  Refrain from any heavy lifting or straining until approved by your doctor. You may drive when you are no longer taking prescription pain medication, you can comfortably wear a seatbelt, and you can safely maneuver your car and apply brakes. RETURN TO WORK:  __________________________________________________________ Mark Joseph should see your doctor in the office for a follow-up appointment approximately 2-3 weeks after your surgery.  Make sure that you call for this appointment within a day or two after you arrive home to insure a convenient appointment time. OTHER INSTRUCTIONS:  __________________________________________________________________________________________________________________________________________________________________________________________  WHEN TO CALL YOUR DOCTOR: Fever over 101.0 Inability to urinate Nausea and/or vomiting Extreme swelling or bruising Continued bleeding from incision. Increased pain, redness, or drainage from the incision  The clinic staff is available to answer your questions during regular business hours.  Please don't hesitate to call and ask to speak to one of the nurses for clinical concerns.  If you have a medical emergency, go to the nearest emergency room or call 911.  A surgeon from [facility] is always on call at the hospital   [address], [place], [place]  [address] ?  [address] [address], [place], [place]   [address] [phone] ? [phone] ? FAX [phone] Web site: [email or web address]    Driving Restrictions  As directed     Comments:       2 weeks    Increase activity slowly  As directed     Lifting restrictions  As directed     Comments:      20 pounds for 6 weeks.    No wound care  As directed         Medication List    TAKE these medications       alendronate 70 MG tablet  Commonly known as:  FOSAMAX  Take 70 mg by mouth every 7 (seven) days. Take on Sundays. Take with a full glass of water on an empty stomach.     aspirin 81 MG tablet  Take 81 mg by mouth daily. To take while off plavix     CALCIUM PO  Take 1 tablet by mouth 2 (two) times daily.     clopidogrel 75 MG tablet  Commonly known as:  PLAVIX  Take 1 tablet (75 mg total) by mouth daily with breakfast.     lisinopril-hydrochlorothiazide 20-25 MG per tablet  Commonly known as:  PRINZIDE,ZESTORETIC  Take 1 tablet by mouth daily.     oxyCODONE 5 MG immediate release tablet  Commonly known as:  Oxy IR/ROXICODONE  Take 1 tablet (5 mg  total) by mouth every 4 (four) hours as needed.     pantoprazole 40 MG tablet  Commonly known as:  PROTONIX  Take 40 mg by mouth daily.     pravastatin 20 MG tablet  Commonly known as:  PRAVACHOL  Take 20 mg by mouth every evening.     tamsulosin 0.4 MG Caps  Commonly known as:  FLOMAX  Take 1 capsule (0.4 mg total) by mouth daily.           Follow-up Information   Follow up with [facility], [facility] In 2 weeks.   Contact information:   [address] [place] [address] [phone]       Signed: Emelia Loron 09/22/2012, 9:05 AM

## 2012-09-29 ENCOUNTER — Telehealth (INDEPENDENT_AMBULATORY_CARE_PROVIDER_SITE_OTHER): Payer: Self-pay | Admitting: General Surgery

## 2012-09-29 NOTE — Telephone Encounter (Signed)
Patient's wife called stating there is a slight blood tinge to his urine today after it has been clear for two days. He has a catheter that his being managed by Dr Kathrynn Running. I advised her to direct questions about the catheter and urine changes to Dr Kathrynn Running. They will call us with any other questions.

## 2012-09-30 ENCOUNTER — Telehealth (INDEPENDENT_AMBULATORY_CARE_PROVIDER_SITE_OTHER): Payer: Self-pay | Admitting: *Deleted

## 2012-09-30 NOTE — Telephone Encounter (Signed)
Wife updated at this time and agreeable.

## 2012-09-30 NOTE — Telephone Encounter (Signed)
Wife called to state that patient was prescribed MiraLax and a stool softener to take every day however patient's bowel movements are extremely loose.  Wife is asking regarding if patient can stop the laxative.  Encouraged wife that if patient is now having diarrhea to stop the laxative.  Wife asked that Dr. Dwain Sarna be advised on the situation and asked his opinion.  Wife states patient didn't take the laxative yesterday and had a bloating feeling on his left side yesterday even though he still had loose stools throughout yesterday.

## 2012-09-30 NOTE — Telephone Encounter (Signed)
Can keep taking softener but stop miralax.  Use miralax as needed.  He should be coming to see me next week also

## 2012-10-02 ENCOUNTER — Observation Stay (HOSPITAL_COMMUNITY)
Admission: EM | Admit: 2012-10-02 | Discharge: 2012-10-04 | Disposition: A | Discharge status: Home / Self Care | Payer: Medicare Other | Attending: General Surgery | Admitting: General Surgery

## 2012-10-02 DIAGNOSIS — R1032 Left lower quadrant pain: Secondary | ICD-10-CM

## 2012-10-02 DIAGNOSIS — R319 Hematuria, unspecified: Secondary | ICD-10-CM

## 2012-10-02 DIAGNOSIS — D649 Anemia, unspecified: Secondary | ICD-10-CM

## 2012-10-02 DIAGNOSIS — E871 Hypo-osmolality and hyponatremia: Secondary | ICD-10-CM

## 2012-10-02 DIAGNOSIS — R339 Retention of urine, unspecified: Secondary | ICD-10-CM | POA: Insufficient documentation

## 2012-10-02 DIAGNOSIS — Z9889 Other specified postprocedural states: Secondary | ICD-10-CM | POA: Insufficient documentation

## 2012-10-02 DIAGNOSIS — Z79899 Other long term (current) drug therapy: Secondary | ICD-10-CM | POA: Insufficient documentation

## 2012-10-02 DIAGNOSIS — N390 Urinary tract infection, site not specified: Principal | ICD-10-CM | POA: Insufficient documentation

## 2012-10-02 DIAGNOSIS — Z8719 Personal history of other diseases of the digestive system: Secondary | ICD-10-CM

## 2012-10-02 DIAGNOSIS — T148XXA Other injury of unspecified body region, initial encounter: Secondary | ICD-10-CM | POA: Diagnosis not present

## 2012-10-02 DIAGNOSIS — I4510 Unspecified right bundle-branch block: Secondary | ICD-10-CM | POA: Insufficient documentation

## 2012-10-02 DIAGNOSIS — E878 Other disorders of electrolyte and fluid balance, not elsewhere classified: Secondary | ICD-10-CM

## 2012-10-02 DIAGNOSIS — I10 Essential (primary) hypertension: Secondary | ICD-10-CM | POA: Insufficient documentation

## 2012-10-02 DIAGNOSIS — Y834 Other reconstructive surgery as the cause of abnormal reaction of the patient, or of later complication, without mention of misadventure at the time of the procedure: Secondary | ICD-10-CM | POA: Insufficient documentation

## 2012-10-02 DIAGNOSIS — D72829 Elevated white blood cell count, unspecified: Secondary | ICD-10-CM | POA: Diagnosis present

## 2012-10-02 DIAGNOSIS — Z8673 Personal history of transient ischemic attack (TIA), and cerebral infarction without residual deficits: Secondary | ICD-10-CM | POA: Insufficient documentation

## 2012-10-02 DIAGNOSIS — IMO0002 Reserved for concepts with insufficient information to code with codable children: Secondary | ICD-10-CM | POA: Insufficient documentation

## 2012-10-02 LAB — CBC WITH DIFFERENTIAL/PLATELET
Basophils Relative: 0 % (ref 0–1)
Hemoglobin: 12 g/dL — ABNORMAL LOW (ref 13.0–17.0)
Lymphocytes Relative: 4 % — ABNORMAL LOW (ref 12–46)
Lymphs Abs: 0.6 10*3/uL — ABNORMAL LOW (ref 0.7–4.0)
MCHC: 35.7 g/dL (ref 30.0–36.0)
Monocytes Relative: 5 % (ref 3–12)
Neutro Abs: 14.1 10*3/uL — ABNORMAL HIGH (ref 1.7–7.7)
Neutrophils Relative %: 91 % — ABNORMAL HIGH (ref 43–77)
RBC: 3.76 MIL/uL — ABNORMAL LOW (ref 4.22–5.81)
WBC: 15.5 10*3/uL — ABNORMAL HIGH (ref 4.0–10.5)

## 2012-10-02 LAB — COMPREHENSIVE METABOLIC PANEL
ALT: 9 U/L (ref 0–53)
Albumin: 3.7 g/dL (ref 3.5–5.2)
BUN: 18 mg/dL (ref 6–23)
Chloride: 91 mEq/L — ABNORMAL LOW (ref 96–112)
GFR calc Af Amer: 89 mL/min — ABNORMAL LOW (ref >90)
GFR calc non Af Amer: 77 mL/min — ABNORMAL LOW (ref >90)
Potassium: 3.7 mEq/L (ref 3.5–5.1)

## 2012-10-02 NOTE — ED Notes (Signed)
NURSE FIRST ROUNDS : NURSE EXPLAINED DELAY , WAIT TIME AND PROCESS , VITAL SIGNS RECHECKED , RESPIRATIONS UNLABORED , REPORTS PERSISTENT LOW ABDOMINAL PAIN . WILL ATTEMPT TO COLLECT URINE SAMPLE AT TRIAGE , FOLEY CATHETER INTACT.

## 2012-10-02 NOTE — ED Notes (Signed)
Pt with recent hernia surgery comes in with new abdominal pain that feels like pressure. Pt has foley placed that he states the urine was becoming clear and is now looking bloody again.

## 2012-10-03 ENCOUNTER — Emergency Department (HOSPITAL_COMMUNITY): Payer: Medicare Other

## 2012-10-03 ENCOUNTER — Encounter (HOSPITAL_COMMUNITY): Payer: Self-pay | Admitting: Physical Medicine and Rehabilitation

## 2012-10-03 DIAGNOSIS — D72829 Elevated white blood cell count, unspecified: Secondary | ICD-10-CM | POA: Diagnosis present

## 2012-10-03 DIAGNOSIS — T148XXA Other injury of unspecified body region, initial encounter: Secondary | ICD-10-CM | POA: Diagnosis not present

## 2012-10-03 DIAGNOSIS — Z9889 Other specified postprocedural states: Secondary | ICD-10-CM

## 2012-10-03 LAB — URINE MICROSCOPIC-ADD ON

## 2012-10-03 LAB — URINALYSIS, ROUTINE W REFLEX MICROSCOPIC
Nitrite: POSITIVE — AB
Specific Gravity, Urine: 1.021 (ref 1.005–1.030)
Urobilinogen, UA: 1 mg/dL (ref 0.0–1.0)
pH: 6 (ref 5.0–8.0)

## 2012-10-03 MED ORDER — FINASTERIDE 5 MG PO TABS
5.0000 mg | ORAL_TABLET | Freq: Every day | ORAL | Status: DC
  Administered 2012-10-03 – 2012-10-04 (×2): 5 mg via ORAL
  Filled 2012-10-03 (×2): qty 1

## 2012-10-03 MED ORDER — LISINOPRIL-HYDROCHLOROTHIAZIDE 20-25 MG PO TABS: 1.0000 | ORAL_TABLET | Freq: Every day | ORAL | Status: DC

## 2012-10-03 MED ORDER — ONDANSETRON HCL 4 MG/2ML IJ SOLN: 4.0000 mg | Freq: Four times a day (QID) | INTRAMUSCULAR | Status: DC | PRN

## 2012-10-03 MED ORDER — LISINOPRIL 20 MG PO TABS
20.0000 mg | ORAL_TABLET | Freq: Every day | ORAL | Status: DC
  Administered 2012-10-03 – 2012-10-04 (×2): 20 mg via ORAL
  Filled 2012-10-03 (×2): qty 1

## 2012-10-03 MED ORDER — TAMSULOSIN HCL 0.4 MG PO CAPS
0.4000 mg | ORAL_CAPSULE | Freq: Every day | ORAL | Status: DC
  Administered 2012-10-03 – 2012-10-04 (×2): 0.4 mg via ORAL
  Filled 2012-10-03 (×2): qty 1

## 2012-10-03 MED ORDER — CIPROFLOXACIN IN D5W 400 MG/200ML IV SOLN
400.0000 mg | Freq: Two times a day (BID) | INTRAVENOUS | Status: DC
  Administered 2012-10-03 (×2): 400 mg via INTRAVENOUS
  Filled 2012-10-03 (×3): qty 200

## 2012-10-03 MED ORDER — OXYCODONE HCL 5 MG PO TABS: 5.0000 mg | ORAL_TABLET | ORAL | Status: DC | PRN

## 2012-10-03 MED ORDER — PANTOPRAZOLE SODIUM 40 MG PO TBEC
40.0000 mg | DELAYED_RELEASE_TABLET | Freq: Every day | ORAL | Status: DC
  Administered 2012-10-03 – 2012-10-04 (×2): 40 mg via ORAL
  Filled 2012-10-03 (×2): qty 1

## 2012-10-03 MED ORDER — IOHEXOL 300 MG/ML  SOLN
50.0000 mL | Freq: Once | INTRAMUSCULAR | Status: AC | PRN
  Administered 2012-10-03: 50 mL via ORAL

## 2012-10-03 MED ORDER — SODIUM CHLORIDE 0.9 % IV SOLN
Freq: Once | INTRAVENOUS | Status: AC
  Administered 2012-10-03: 03:00:00 via INTRAVENOUS

## 2012-10-03 MED ORDER — DOCUSATE SODIUM 100 MG PO CAPS: 100.0000 mg | ORAL_CAPSULE | Freq: Every day | ORAL | Status: DC | PRN

## 2012-10-03 MED ORDER — SIMVASTATIN 5 MG PO TABS
5.0000 mg | ORAL_TABLET | Freq: Every day | ORAL | Status: DC
  Administered 2012-10-03: 5 mg via ORAL
  Filled 2012-10-03 (×2): qty 1

## 2012-10-03 MED ORDER — HYDROCHLOROTHIAZIDE 25 MG PO TABS
25.0000 mg | ORAL_TABLET | Freq: Every day | ORAL | Status: DC
  Administered 2012-10-03 – 2012-10-04 (×2): 25 mg via ORAL
  Filled 2012-10-03 (×2): qty 1

## 2012-10-03 MED ORDER — KCL IN DEXTROSE-NACL 20-5-0.45 MEQ/L-%-% IV SOLN
INTRAVENOUS | Status: DC
  Administered 2012-10-03: 12:00:00 via INTRAVENOUS
  Filled 2012-10-03 (×4): qty 1000

## 2012-10-03 MED ORDER — IOHEXOL 300 MG/ML  SOLN
80.0000 mL | Freq: Once | INTRAMUSCULAR | Status: AC | PRN
  Administered 2012-10-03: 80 mL via INTRAVENOUS

## 2012-10-03 MED ORDER — DEXTROSE 5 % IV SOLN
1.0000 g | Freq: Once | INTRAVENOUS | Status: AC
  Administered 2012-10-03: 1 g via INTRAVENOUS
  Filled 2012-10-03: qty 10

## 2012-10-03 MED ORDER — POLYETHYLENE GLYCOL 3350 17 G PO PACK
17.0000 g | PACK | Freq: Every day | ORAL | Status: DC
  Filled 2012-10-03: qty 1

## 2012-10-03 MED ORDER — CLOPIDOGREL BISULFATE 75 MG PO TABS
75.0000 mg | ORAL_TABLET | Freq: Every day | ORAL | Status: DC
  Administered 2012-10-04: 75 mg via ORAL
  Filled 2012-10-03 (×3): qty 1

## 2012-10-03 NOTE — ED Notes (Signed)
Irrigated catheter. Equal output as output. Pt tolerated well. Output was clear with no evidence of a clot. MD Lynelle Doctor made aware.

## 2012-10-03 NOTE — ED Provider Notes (Signed)
History     CSN: [id number]  Arrival date & time 10/02/12  2113   First MD Initiated Contact with Patient 10/02/12 2346      Chief Complaint  Patient presents with  . Abdominal Pain    (Consider location/radiation/quality/duration/timing/severity/associated sxs/prior treatment) HPI  Patient reports he had bilateral inguinal hernia repair done May 8 by Dr. Dwain Sarna. He was seen in the office the following week. He reports he was having difficulty urinating and he had to see the urologist, Dr. Berneice Heinrich. He currently still has a Foley catheter in place. He relates last night he was very restless and had difficulty sleeping. He noted he started having some blood in his catheter. He states he was taking a stool softener and MiraLAX for constipation after his surgery however he stopped because he was starting to have some loose stools. He states he is having left lower quadrant discomfort but denies nausea or vomiting. He states his appetite is improving.  Patient reports he had diverticulosis on a colonoscopy done about 10 years ago. He states he's never had diverticulitis.  PCP Dr Roseanne Reno Surgeon Dr Dwain Sarna Urology Dr Berneice Heinrich  Past Medical History  Diagnosis Date  . Hypertension   . GERD (gastroesophageal reflux disease)   . Osteoporosis   . High cholesterol   . Stroke 01/2012    "small; affected his speech; pretty much back to normal now" (09/15/2012)  . Pneumonitis 1971  . Arthritis     "knees; right is worse" (09/15/2012)    Past Surgical History  Procedure Laterality Date  . Appendectomy  1953  . Inguinal hernia repair Bilateral 09/15/2012    w/mesh Hattie Perch 09/15/2012  . Inguinal hernia repair Bilateral 09/15/2012    Procedure: HERNIA REPAIR INGUINAL ADULT BILATERAL;  Surgeon: Emelia Loron, MD;  Location: [facility] OR;  Service: General;  Laterality: Bilateral;  . Insertion of mesh Bilateral 09/15/2012    Procedure: INSERTION OF MESH;  Surgeon: Emelia Loron, MD;  Location: [facility] OR;   Service: General;  Laterality: Bilateral;    History reviewed. No pertinent family history.  History  Substance Use Topics  . Smoking status: Former Smoker -- 0.50 packs/day for 25 years    Types: Cigarettes    Quit date: 05/11/1968  . Smokeless tobacco: Never Used  . Alcohol Use: 4.2 oz/week    7 Glasses of wine per week   Lives at home Lives with spouse   Review of Systems  All other systems reviewed and are negative.    Allergies  Review of patient's allergies indicates no known allergies.  Home Medications   Current Outpatient Rx  Name  Route  Sig  Dispense  Refill  . alendronate (FOSAMAX) 70 MG tablet   Oral   Take 70 mg by mouth every 7 (seven) days. Take on Sundays. Take with a full glass of water on an empty stomach.         Marland Kitchen CALCIUM PO   Oral   Take 1 tablet by mouth 2 (two) times daily.         . clopidogrel (PLAVIX) 75 MG tablet   Oral   Take 1 tablet (75 mg total) by mouth daily with breakfast.   30 tablet   11   . docusate sodium (COLACE) 100 MG capsule   Oral   Take 100 mg by mouth daily as needed for constipation.         Marland Kitchen lisinopril-hydrochlorothiazide (PRINZIDE,ZESTORETIC) 20-25 MG per tablet   Oral   Take  1 tablet by mouth daily.         . pantoprazole (PROTONIX) 40 MG tablet   Oral   Take 40 mg by mouth daily.         . polyethylene glycol (MIRALAX / GLYCOLAX) packet   Oral   Take 17 g by mouth daily.         . pravastatin (PRAVACHOL) 20 MG tablet   Oral   Take 20 mg by mouth every evening.         . tamsulosin (FLOMAX) 0.4 MG CAPS   Oral   Take 1 capsule (0.4 mg total) by mouth daily.   30 capsule   1     BP 118/87  Pulse 88  Temp(Src) 97.4 F (36.3 C) (Oral)  Resp 18  SpO2 98%  Vital signs normal    Physical Exam  Nursing note and vitals reviewed. Constitutional: He is oriented to person, place, and time.  Non-toxic appearance. He does not appear ill. No distress.  Elderly frail man  HENT:    Head: Normocephalic and atraumatic.  Right Ear: External ear normal.  Left Ear: External ear normal.  Nose: Nose normal. No mucosal edema or rhinorrhea.  Mouth/Throat: Oropharynx is clear and moist and mucous membranes are normal. No dental abscesses or edematous.  Eyes: Conjunctivae and EOM are normal. Pupils are equal, round, and reactive to light.  Neck: Normal range of motion and full passive range of motion without pain. Neck supple.  Pulmonary/Chest: Effort normal. No respiratory distress. He has no rhonchi. He exhibits no crepitus.  Abdominal: Soft. Normal appearance and bowel sounds are normal. He exhibits distension. There is no tenderness. There is no rebound and no guarding.    Area of pain noted, pt has foley with bloody urine in tubing and bag. Well healing bilateral inguinal incisions.  Musculoskeletal: Normal range of motion. He exhibits no edema and no tenderness.  Moves all extremities well.   Neurological: He is alert and oriented to person, place, and time. He has normal strength. No cranial nerve deficit.  Skin: Skin is warm, dry and intact. No rash noted. No erythema. No pallor.  Psychiatric: He has a normal mood and affect. His speech is normal and behavior is normal. His mood appears not anxious.    ED Course  Procedures (including critical care time)  Medications  cefTRIAXone (ROCEPHIN) 1 g in dextrose 5 % 50 mL IVPB (not administered)  0.9 %  sodium chloride infusion ( Intravenous New Bag/Given 10/03/12 0311)  iohexol (OMNIPAQUE) 300 MG/ML solution 50 mL (50 mLs Oral Contrast Given 10/03/12 0325)  iohexol (OMNIPAQUE) 300 MG/ML solution 80 mL (80 mLs Intravenous Contrast Given 10/03/12 0531)   Bladder scan was 115 cc  Patient's Foley was flushed with good return. This did not improve his discomfort. At this point it decided to proceed with a CT scan for possible diverticulitis or other postop problem.  Pt was started on IV rocephin for his UTI. He has an appt in  3 days with his urologist.   07:22 after reviewing his AP CT scan, Dr Dwain Sarna consulted and will come see patient in the ED.   Results for orders placed during the hospital encounter of 10/02/12  CBC WITH DIFFERENTIAL      Result Value Range   WBC 15.5 (*) 4.0 - 10.5 K/uL   RBC 3.76 (*) 4.22 - 5.81 MIL/uL   Hemoglobin 12.0 (*) 13.0 - 17.0 g/dL   HCT 04.5 (*) 40.9 -  52.0 %   MCV 89.4  78.0 - 100.0 fL   MCH 31.9  26.0 - 34.0 pg   MCHC 35.7  30.0 - 36.0 g/dL   RDW 16.1  09.6 - 04.5 %   Platelets 286  150 - 400 K/uL   Neutrophils Relative % 91 (*) 43 - 77 %   Neutro Abs 14.1 (*) 1.7 - 7.7 K/uL   Lymphocytes Relative 4 (*) 12 - 46 %   Lymphs Abs 0.6 (*) 0.7 - 4.0 K/uL   Monocytes Relative 5  3 - 12 %   Monocytes Absolute 0.8  0.1 - 1.0 K/uL   Eosinophils Relative 0  0 - 5 %   Eosinophils Absolute 0.1  0.0 - 0.7 K/uL   Basophils Relative 0  0 - 1 %   Basophils Absolute 0.0  0.0 - 0.1 K/uL  COMPREHENSIVE METABOLIC PANEL      Result Value Range   Sodium 129 (*) 135 - 145 mEq/L   Potassium 3.7  3.5 - 5.1 mEq/L   Chloride 91 (*) 96 - 112 mEq/L   CO2 25  19 - 32 mEq/L   Glucose, Bld 167 (*) 70 - 99 mg/dL   BUN 18  6 - 23 mg/dL   Creatinine, Ser 4.09  0.50 - 1.35 mg/dL   Calcium 9.2  8.4 - 81.1 mg/dL   Total Protein 6.6  6.0 - 8.3 g/dL   Albumin 3.7  3.5 - 5.2 g/dL   AST 18  0 - 37 U/L   ALT 9  0 - 53 U/L   Alkaline Phosphatase 57  39 - 117 U/L   Total Bilirubin 0.7  0.3 - 1.2 mg/dL   GFR calc non Af Amer 77 (*) >90 mL/min   GFR calc Af Amer 89 (*) >90 mL/min  URINALYSIS, ROUTINE W REFLEX MICROSCOPIC      Result Value Range   Color, Urine RED (*) YELLOW   APPearance CLOUDY (*) CLEAR   Specific Gravity, Urine 1.021  1.005 - 1.030   pH 6.0  5.0 - 8.0   Glucose, UA NEGATIVE  NEGATIVE mg/dL   Hgb urine dipstick LARGE (*) NEGATIVE   Bilirubin Urine SMALL (*) NEGATIVE   Ketones, ur 15 (*) NEGATIVE mg/dL   Protein, ur 914 (*) NEGATIVE mg/dL   Urobilinogen, UA 1.0  0.0 - 1.0  mg/dL   Nitrite POSITIVE (*) NEGATIVE   Leukocytes, UA MODERATE (*) NEGATIVE  URINE MICROSCOPIC-ADD ON      Result Value Range   WBC, UA 11-20  <3 WBC/hpf   RBC / HPF TOO NUMEROUS TO COUNT  <3 RBC/hpf   Bacteria, UA MANY (*) RARE   Laboratory interpretation all normal except possible UTI, leukocytosis, mild stable anemia, hyperglycemia   Dg Abd Acute W/chest  10/03/2012   *RADIOLOGY REPORT*  Clinical Data: Abdominal pain, recent surgery  ACUTE ABDOMEN SERIES (ABDOMEN 2 VIEW & CHEST 1 VIEW)  Comparison: Chest radiograph 01/30/2012  Findings: Normal mediastinum and heart silhouette.  Costophrenic angles are clear.  No effusion, infiltrate, or pneumothorax.  Left basilar atelectasis similar to prior.  No dilated loops of large or small bowel.  There is gas and stool in the rectum.  No pathologic calcifications.  IMPRESSION:  1.  Mild bibasilar atelectasis. 2.  No bowel obstruction or intraperitoneal free air.   Original Report Authenticated By: Genevive Bi, M.D.     Ct Abdomen Pelvis W Contrast  10/03/2012   *RADIOLOGY REPORT*  Clinical Data: Leukocytosis;  left lower quadrant abdominal pain. Recent bilateral inguinal hernia repair.  Abdominal bloating.  CT ABDOMEN AND PELVIS WITH CONTRAST  Technique:  Multidetector CT imaging of the abdomen and pelvis was performed following the standard protocol during bolus administration of intravenous contrast.  Contrast: 80mL OMNIPAQUE IOHEXOL 300 MG/ML  SOLN  Comparison: None  Findings: Mild bibasilar atelectasis is noted.  There are hypodensities are noted within the liver, measuring up to 0.8 cm in size.  These are nonspecific but most likely reflect small cysts.  The liver is otherwise unremarkable in appearance. The spleen is within normal limits.  A stone is noted dependently within the gallbladder; the gallbladder is otherwise unremarkable. The pancreas and adrenal glands are within normal limits.  Nonspecific perinephric stranding is noted  bilaterally, more prominent on the left.  There is a somewhat prominent left sided extrarenal pelvis.  The kidneys are otherwise unremarkable in appearance.  There is no evidence of hydronephrosis.  No renal or ureteral stones are seen.  The small bowel is unremarkable in appearance.  The stomach is within normal limits.  No acute vascular abnormalities are seen. There is mild ectasia of the mid abdominal aorta, without evidence of aneurysmal dilatation.  Scattered calcification is noted along the abdominal aorta and its branches.  The patient is status post appendectomy.  Contrast progresses to the cecum.  Scattered diverticulosis is noted along the descending colon, and relatively diffuse diverticulosis is seen along the sigmoid colon, without evidence of diverticulitis.  Trace free fluid within the pelvis is nonspecific in appearance.  The bladder is mildly distended, with a Foley catheter in place. Air within the bladder likely reflects Foley catheter placement. There is somewhat irregular bladder wall thickening; this may reflect relative decompression, though cystoscopy could be considered for further evaluation.  No inguinal lymphadenopathy is seen.  There is a mildly complex 5.6 x 3.8 x 5.4 cm collection of fluid, with a fluid-fluid level, at the left inguinal region, just deep to the abdominal wall.  This is compatible with resolving postoperative hematoma.  Additional trace fluid, air and mild enhancement are seen at the right inguinal region, just deep to the abdominal wall.  This may simply reflect a postoperative fluid collection, though early abscess formation cannot be entirely excluded.  Overlying soft tissue inflammation and edema are seen.  A small amount of fluid and air is noted tracking inferiorly along the right spermatic cord, raising question for early abscess formation, though this may also simply be postoperative.  There appears to be skin thickening along the right side of the scrotum.  No  acute osseous abnormalities are identified.  Vacuum phenomenon is noted at the lower lumbar spine.  There is mild chronic compression deformity of vertebral body T12.  IMPRESSION:  1.  Mildly complex 5.6 x 3.8 x 5.4 cm collection of fluid at the left inguinal region, just deep to the abdominal wall.  This demonstrates a fluid-fluid level, compatible with resolving postoperative hematoma.  Overlying soft tissue inflammation seen. 2.  Trace fluid, air and mild enhancement at the right inguinal region, just deep to the abdominal wall.  This may simply reflect a postoperative fluid collection, though early abscess formation cannot be entirely excluded.  Overlying soft tissue inflammation seen.  Small amount of fluid and air also noted tracking inferiorly along the right spermatic cord, raising question for early abscess, though this may simply be postoperative.  Right-sided scrotal wall thickening suggested; would correlate clinically for any symptoms of cellulitis or abscess.  3. Somewhat irregular bladder wall thickening noted; this may reflect relative decompression, though cystoscopy could be considered for further evaluation, when and as deemed clinically appropriate. 4.  Diverticulosis along the descending and sigmoid colon, without evidence of diverticulitis. 5.  Scattered hepatic cysts seen. 6.  Cholelithiasis; gallbladder otherwise unremarkable in appearance. 7.  Mild bibasilar atelectasis noted. 8.  Mild chronic compression deformity of vertebral body T12. 9.  Scattered calcification along the abdominal aorta and its branches.   Original Report Authenticated By: Tonia Ghent, M.D.     1. Hematuria   2. UTI (lower urinary tract infection)   3. Hyponatremia   4. Chloride, decreased level   5. Anemia   6. Abdominal pain, LLQ (left lower quadrant)     Disposition per Dr Dwain Sarna.   Plan discharge  Devoria Albe, MD, FACEP   MDM          Ward Givens, MD 10/03/12 [phone]

## 2012-10-03 NOTE — ED Notes (Signed)
MD at bedside. 

## 2012-10-03 NOTE — H&P (Signed)
Agree with above, I think he has a uti, will treat and possibly remove foley in am as edema considerably down

## 2012-10-03 NOTE — ED Notes (Signed)
Pt resting quietly at the time. Vital signs stable. Family at bedside. No signs of distress noted.

## 2012-10-03 NOTE — ED Notes (Signed)
Pt has foley in place with bloody dark output. Pt reports increased lower abdominal pressure x 3 days, worse today. Incision sites clean, dry, intact, no s/s infection. Pt denies pain, but just reports being uncomfortable. Denies pain on palpation. MD Lynelle Doctor made aware and is at bedside.

## 2012-10-03 NOTE — H&P (Signed)
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   Primary Care MD: Dr. Kirby Funk Chief Complaint/Reason for Consult: abdominal pain after B hernia repairs HPI: This is a pleasant 79yo male who had bilateral inguinal hernia repairs by Dr. Dwain Sarna a couple of weeks ago.  He had some issues with urinary retention and is followed by Dr. Berneice Heinrich for that.  He still has his foley in place.  He has been doing well until the last couple of days he has been getting increasing discomfort low in his pelvis.  He had some nausea last night, but no emesis.  His wife states he has had some blood in his urine, but it is clear right now.  He came to the [facility] last night due to worsening pain of his lower abdomen.  He had a CT here which reveals a resolving hematoma on the left side along with a small fluid collection along the spermatic cord on the right side.  His WBC is 15.8K.  We have been asked to see him.  Review of Systems: Please see HPI, otherwise negative.  History reviewed. No pertinent family history.  Past Medical History  Diagnosis Date  . Hypertension   . GERD (gastroesophageal reflux disease)   . Osteoporosis   . High cholesterol   . Stroke 01/2012    "small; affected his speech; pretty much back to normal now" (09/15/2012)  . Pneumonitis 1971  . Arthritis     "knees; right is worse" (09/15/2012)    Past Surgical History  Procedure Laterality Date  . Appendectomy  1953  . Inguinal hernia repair Bilateral 09/15/2012    w/mesh Hattie Perch 09/15/2012  . Inguinal hernia repair Bilateral 09/15/2012    Procedure: HERNIA REPAIR INGUINAL ADULT BILATERAL;  Surgeon: Emelia Loron, MD;  Location: [facility] OR;  Service: General;  Laterality: Bilateral;  . Insertion of mesh Bilateral 09/15/2012    Procedure: INSERTION OF MESH;  Surgeon: Emelia Loron, MD;  Location: [facility] OR;  Service: General;  Laterality: Bilateral;    Social History:  reports that he quit smoking about 44 years ago. His smoking use included Cigarettes.  He has a 12.5 pack-year smoking history. He has never used smokeless tobacco. He reports that he drinks about 4.2 ounces of alcohol per week. He reports that he does not use illicit drugs.  Allergies: No Known Allergies   (Not in a hospital admission)  Blood pressure 125/84, pulse 68, temperature 97.4 F (36.3 C), temperature source Oral, resp. rate 18, SpO2 96.00%. Physical Exam: General: pleasant, WD, WN white male who is laying in bed in NAD HEENT: head is normocephalic, atraumatic.  Sclera are noninjected.  PERRL.  Ears and nose without any masses or lesions.  Mouth is pink and moist Heart: normal rate.  Unable to tell if patient is having premature beats or is irregular..  Normal s1,s2. No obvious murmurs, gallops, or rubs noted.  Palpable radial and pedal pulses bilaterally Lungs: CTAB, no wheezes, rhonchi, or rales noted.  Respiratory effort nonlabored Abd: soft, NT, ND, +BS, no masses, hernias, or organomegaly, incisions are healing well bilaterally.  Some dermabond still present.  Maybe some slight erythema on the right side tracking down his groin, but if so very minimal.  Otherwise, normal exam MS: all 4 extremities are symmetrical with no cyanosis, clubbing, or edema. Skin: warm and dry with no masses, lesions, or rashes Psych: A&Ox3 with an appropriate affect.    Results for orders placed during the hospital encounter of 10/02/12 (from the past  48 hour(s))  CBC WITH DIFFERENTIAL     Status: Abnormal   Collection Time    10/02/12  9:40 PM      Result Value Range   WBC 15.5 (*) 4.0 - 10.5 K/uL   RBC 3.76 (*) 4.22 - 5.81 MIL/uL   Hemoglobin 12.0 (*) 13.0 - 17.0 g/dL   HCT 16.1 (*) 09.6 - 04.5 %   MCV 89.4  78.0 - 100.0 fL   MCH 31.9  26.0 - 34.0 pg   MCHC 35.7  30.0 - 36.0 g/dL   RDW 40.9  81.1 - 91.4 %   Platelets 286  150 - 400 K/uL   Neutrophils Relative % 91 (*) 43 - 77 %   Neutro Abs 14.1 (*) 1.7 - 7.7 K/uL   Lymphocytes Relative 4 (*) 12 - 46 %   Lymphs Abs 0.6  (*) 0.7 - 4.0 K/uL   Monocytes Relative 5  3 - 12 %   Monocytes Absolute 0.8  0.1 - 1.0 K/uL   Eosinophils Relative 0  0 - 5 %   Eosinophils Absolute 0.1  0.0 - 0.7 K/uL   Basophils Relative 0  0 - 1 %   Basophils Absolute 0.0  0.0 - 0.1 K/uL  COMPREHENSIVE METABOLIC PANEL     Status: Abnormal   Collection Time    10/02/12  9:40 PM      Result Value Range   Sodium 129 (*) 135 - 145 mEq/L   Potassium 3.7  3.5 - 5.1 mEq/L   Chloride 91 (*) 96 - 112 mEq/L   CO2 25  19 - 32 mEq/L   Glucose, Bld 167 (*) 70 - 99 mg/dL   BUN 18  6 - 23 mg/dL   Creatinine, Ser 7.82  0.50 - 1.35 mg/dL   Calcium 9.2  8.4 - 95.6 mg/dL   Total Protein 6.6  6.0 - 8.3 g/dL   Albumin 3.7  3.5 - 5.2 g/dL   AST 18  0 - 37 U/L   ALT 9  0 - 53 U/L   Alkaline Phosphatase 57  39 - 117 U/L   Total Bilirubin 0.7  0.3 - 1.2 mg/dL   GFR calc non Af Amer 77 (*) >90 mL/min   GFR calc Af Amer 89 (*) >90 mL/min   Comment:            The eGFR has been calculated     using the CKD EPI equation.     This calculation has not been     validated in all clinical     situations.     eGFR's persistently     <90 mL/min signify     possible Chronic Kidney Disease.  URINALYSIS, ROUTINE W REFLEX MICROSCOPIC     Status: Abnormal   Collection Time    10/02/12 11:25 PM      Result Value Range   Color, Urine RED (*) YELLOW   Comment: BIOCHEMICALS MAY BE AFFECTED BY COLOR   APPearance CLOUDY (*) CLEAR   Specific Gravity, Urine 1.021  1.005 - 1.030   pH 6.0  5.0 - 8.0   Glucose, UA NEGATIVE  NEGATIVE mg/dL   Hgb urine dipstick LARGE (*) NEGATIVE   Bilirubin Urine SMALL (*) NEGATIVE   Ketones, ur 15 (*) NEGATIVE mg/dL   Protein, ur 213 (*) NEGATIVE mg/dL   Urobilinogen, UA 1.0  0.0 - 1.0 mg/dL   Nitrite POSITIVE (*) NEGATIVE   Leukocytes, UA MODERATE (*) NEGATIVE  URINE  MICROSCOPIC-ADD ON     Status: Abnormal   Collection Time    10/02/12 11:25 PM      Result Value Range   WBC, UA 11-20  <3 WBC/hpf   RBC / HPF TOO NUMEROUS  TO COUNT  <3 RBC/hpf   Bacteria, UA MANY (*) RARE   Ct Abdomen Pelvis W Contrast  10/03/2012   *RADIOLOGY REPORT*  Clinical Data: Leukocytosis; left lower quadrant abdominal pain. Recent bilateral inguinal hernia repair.  Abdominal bloating.  CT ABDOMEN AND PELVIS WITH CONTRAST  Technique:  Multidetector CT imaging of the abdomen and pelvis was performed following the standard protocol during bolus administration of intravenous contrast.  Contrast: 80mL OMNIPAQUE IOHEXOL 300 MG/ML  SOLN  Comparison: None  Findings: Mild bibasilar atelectasis is noted.  There are hypodensities are noted within the liver, measuring up to 0.8 cm in size.  These are nonspecific but most likely reflect small cysts.  The liver is otherwise unremarkable in appearance. The spleen is within normal limits.  A stone is noted dependently within the gallbladder; the gallbladder is otherwise unremarkable. The pancreas and adrenal glands are within normal limits.  Nonspecific perinephric stranding is noted bilaterally, more prominent on the left.  There is a somewhat prominent left sided extrarenal pelvis.  The kidneys are otherwise unremarkable in appearance.  There is no evidence of hydronephrosis.  No renal or ureteral stones are seen.  The small bowel is unremarkable in appearance.  The stomach is within normal limits.  No acute vascular abnormalities are seen. There is mild ectasia of the mid abdominal aorta, without evidence of aneurysmal dilatation.  Scattered calcification is noted along the abdominal aorta and its branches.  The patient is status post appendectomy.  Contrast progresses to the cecum.  Scattered diverticulosis is noted along the descending colon, and relatively diffuse diverticulosis is seen along the sigmoid colon, without evidence of diverticulitis.  Trace free fluid within the pelvis is nonspecific in appearance.  The bladder is mildly distended, with a Foley catheter in place. Air within the bladder likely reflects  Foley catheter placement. There is somewhat irregular bladder wall thickening; this may reflect relative decompression, though cystoscopy could be considered for further evaluation.  No inguinal lymphadenopathy is seen.  There is a mildly complex 5.6 x 3.8 x 5.4 cm collection of fluid, with a fluid-fluid level, at the left inguinal region, just deep to the abdominal wall.  This is compatible with resolving postoperative hematoma.  Additional trace fluid, air and mild enhancement are seen at the right inguinal region, just deep to the abdominal wall.  This may simply reflect a postoperative fluid collection, though early abscess formation cannot be entirely excluded.  Overlying soft tissue inflammation and edema are seen.  A small amount of fluid and air is noted tracking inferiorly along the right spermatic cord, raising question for early abscess formation, though this may also simply be postoperative.  There appears to be skin thickening along the right side of the scrotum.  No acute osseous abnormalities are identified.  Vacuum phenomenon is noted at the lower lumbar spine.  There is mild chronic compression deformity of vertebral body T12.  IMPRESSION:  1.  Mildly complex 5.6 x 3.8 x 5.4 cm collection of fluid at the left inguinal region, just deep to the abdominal wall.  This demonstrates a fluid-fluid level, compatible with resolving postoperative hematoma.  Overlying soft tissue inflammation seen. 2.  Trace fluid, air and mild enhancement at the right inguinal region, just deep  to the abdominal wall.  This may simply reflect a postoperative fluid collection, though early abscess formation cannot be entirely excluded.  Overlying soft tissue inflammation seen.  Small amount of fluid and air also noted tracking inferiorly along the right spermatic cord, raising question for early abscess, though this may simply be postoperative.  Right-sided scrotal wall thickening suggested; would correlate clinically for any  symptoms of cellulitis or abscess. 3. Somewhat irregular bladder wall thickening noted; this may reflect relative decompression, though cystoscopy could be considered for further evaluation, when and as deemed clinically appropriate. 4.  Diverticulosis along the descending and sigmoid colon, without evidence of diverticulitis. 5.  Scattered hepatic cysts seen. 6.  Cholelithiasis; gallbladder otherwise unremarkable in appearance. 7.  Mild bibasilar atelectasis noted. 8.  Mild chronic compression deformity of vertebral body T12. 9.  Scattered calcification along the abdominal aorta and its branches.   Original Report Authenticated By: Tonia Ghent, M.D.   Dg Abd Acute W/chest  10/03/2012   *RADIOLOGY REPORT*  Clinical Data: Abdominal pain, recent surgery  ACUTE ABDOMEN SERIES (ABDOMEN 2 VIEW & CHEST 1 VIEW)  Comparison: Chest radiograph 01/30/2012  Findings: Normal mediastinum and heart silhouette.  Costophrenic angles are clear.  No effusion, infiltrate, or pneumothorax.  Left basilar atelectasis similar to prior.  No dilated loops of large or small bowel.  There is gas and stool in the rectum.  No pathologic calcifications.  IMPRESSION:  1.  Mild bibasilar atelectasis. 2.  No bowel obstruction or intraperitoneal free air.   Original Report Authenticated By: Genevive Bi, M.D.       Assessment/Plan 1. Left sided postoperative hematoma  2. Small right sided fluid collection along spermatic cord 3. S/p bilateral open inguinal hernia repairs 4. PAC with RBBB, seen on old EKGs 5. HTN 6. H/o CVA 7. Mild leukocytosis  8. Urinary retention, s/p hernia repairs with foley in place  Plan: 1. We will get the patient admitted for IV abx therapy given CT scan findings and elevated WBC.  2. Cont foley catheter for now.  He has a follow up appt with Dr. Berneice Heinrich on Thursday 3. EKG reveals PACs and RBBB, this is c/w his old EKG that he had in September of 2013. 4. Will allow a diet 5. Recheck labs in the  morning.  Kristen Bushway E 10/03/2012, 8:09 AM Pager: [phone]

## 2012-10-04 LAB — CBC
MCH: 32.7 pg (ref 26.0–34.0)
MCV: 90.9 fL (ref 78.0–100.0)
Platelets: 226 10*3/uL (ref 150–400)
RBC: 3.39 MIL/uL — ABNORMAL LOW (ref 4.22–5.81)

## 2012-10-04 LAB — BASIC METABOLIC PANEL
CO2: 30 mEq/L (ref 19–32)
Calcium: 8.8 mg/dL (ref 8.4–10.5)
Glucose, Bld: 105 mg/dL — ABNORMAL HIGH (ref 70–99)
Sodium: 135 mEq/L (ref 135–145)

## 2012-10-04 MED ORDER — CIPROFLOXACIN HCL 500 MG PO TABS
500.0000 mg | ORAL_TABLET | Freq: Two times a day (BID) | ORAL | Status: DC
  Administered 2012-10-04: 500 mg via ORAL
  Filled 2012-10-04 (×2): qty 1

## 2012-10-04 MED ORDER — CIPROFLOXACIN HCL 500 MG PO TABS: 500.0000 mg | ORAL_TABLET | Freq: Two times a day (BID) | ORAL | Status: DC

## 2012-10-04 MED ORDER — OXYCODONE HCL 5 MG PO TABS: 5.0000 mg | ORAL_TABLET | ORAL | Status: DC | PRN

## 2012-10-04 NOTE — Progress Notes (Signed)
Nutrition Brief Note  Patient identified on the Malnutrition Screening Tool (MST) Report  Body mass index is 21.99 kg/(m^2). Patient meets criteria for wnl based on current BMI.   Current diet order is Regular, patient is consuming approximately 75-90% of meals at this time. Labs and medications reviewed.   Pt unsure of wt status on admission, however no evidence of recent loss. Wt stable per chart review.  Currently eating well.  Possible d/c home today with improvement in pain. No nutrition interventions warranted at this time. If nutrition issues arise, please consult RD.   Loyce Dys, MS RD LDN Clinical Inpatient Dietitian Pager: [phone] Weekend/After hours pager: [phone]

## 2012-10-04 NOTE — Progress Notes (Signed)
Subjective: Feels better,had bm  Objective: Vital signs in last 24 hours: Temp:  [97.5 F (36.4 C)-98.2 F (36.8 C)] 98 F (36.7 C) (05/27 0529) Pulse Rate:  [65-81] 79 (05/27 0529) Resp:  [16-18] 16 (05/27 0529) BP: (111-139)/(64-105) 135/90 mmHg (05/27 0529) SpO2:  [95 %-99 %] 95 % (05/27 0529) Weight:  [157 lb 10.1 oz (71.5 kg)] 157 lb 10.1 oz (71.5 kg) (05/26 0922) Last BM Date: 10/02/12  Intake/Output from previous day: 05/26 0701 - 05/27 0700 In: 1647.5 [P.O.:780; I.V.:667.5; IV Piggyback:200] Out: 4200 [Urine:4200] Intake/Output this shift:    abdomen soft, wounds clean  Lab Results:   Recent Labs  10/02/12 2140 10/04/12 0445  WBC 15.5* 7.7  HGB 12.0* 11.1*  HCT 33.6* 30.8*  PLT 286 226   BMET  Recent Labs  10/02/12 2140 10/04/12 0445  NA 129* 135  K 3.7 4.1  CL 91* 97  CO2 25 30  GLUCOSE 167* 105*  BUN 18 14  CREATININE 0.82 0.95  CALCIUM 9.2 8.8   PT/INR No results found for this basename: LABPROT, INR,  in the last 72 hours ABG No results found for this basename: PHART, PCO2, PO2, HCO3,  in the last 72 hours  Studies/Results: Ct Abdomen Pelvis W Contrast  10/03/2012   *RADIOLOGY REPORT*  Clinical Data: Leukocytosis; left lower quadrant abdominal pain. Recent bilateral inguinal hernia repair.  Abdominal bloating.  CT ABDOMEN AND PELVIS WITH CONTRAST  Technique:  Multidetector CT imaging of the abdomen and pelvis was performed following the standard protocol during bolus administration of intravenous contrast.  Contrast: 80mL OMNIPAQUE IOHEXOL 300 MG/ML  SOLN  Comparison: None  Findings: Mild bibasilar atelectasis is noted.  There are hypodensities are noted within the liver, measuring up to 0.8 cm in size.  These are nonspecific but most likely reflect small cysts.  The liver is otherwise unremarkable in appearance. The spleen is within normal limits.  A stone is noted dependently within the gallbladder; the gallbladder is otherwise unremarkable.  The pancreas and adrenal glands are within normal limits.  Nonspecific perinephric stranding is noted bilaterally, more prominent on the left.  There is a somewhat prominent left sided extrarenal pelvis.  The kidneys are otherwise unremarkable in appearance.  There is no evidence of hydronephrosis.  No renal or ureteral stones are seen.  The small bowel is unremarkable in appearance.  The stomach is within normal limits.  No acute vascular abnormalities are seen. There is mild ectasia of the mid abdominal aorta, without evidence of aneurysmal dilatation.  Scattered calcification is noted along the abdominal aorta and its branches.  The patient is status post appendectomy.  Contrast progresses to the cecum.  Scattered diverticulosis is noted along the descending colon, and relatively diffuse diverticulosis is seen along the sigmoid colon, without evidence of diverticulitis.  Trace free fluid within the pelvis is nonspecific in appearance.  The bladder is mildly distended, with a Foley catheter in place. Air within the bladder likely reflects Foley catheter placement. There is somewhat irregular bladder wall thickening; this may reflect relative decompression, though cystoscopy could be considered for further evaluation.  No inguinal lymphadenopathy is seen.  There is a mildly complex 5.6 x 3.8 x 5.4 cm collection of fluid, with a fluid-fluid level, at the left inguinal region, just deep to the abdominal wall.  This is compatible with resolving postoperative hematoma.  Additional trace fluid, air and mild enhancement are seen at the right inguinal region, just deep to the abdominal wall.  This  may simply reflect a postoperative fluid collection, though early abscess formation cannot be entirely excluded.  Overlying soft tissue inflammation and edema are seen.  A small amount of fluid and air is noted tracking inferiorly along the right spermatic cord, raising question for early abscess formation, though this may also  simply be postoperative.  There appears to be skin thickening along the right side of the scrotum.  No acute osseous abnormalities are identified.  Vacuum phenomenon is noted at the lower lumbar spine.  There is mild chronic compression deformity of vertebral body T12.  IMPRESSION:  1.  Mildly complex 5.6 x 3.8 x 5.4 cm collection of fluid at the left inguinal region, just deep to the abdominal wall.  This demonstrates a fluid-fluid level, compatible with resolving postoperative hematoma.  Overlying soft tissue inflammation seen. 2.  Trace fluid, air and mild enhancement at the right inguinal region, just deep to the abdominal wall.  This may simply reflect a postoperative fluid collection, though early abscess formation cannot be entirely excluded.  Overlying soft tissue inflammation seen.  Small amount of fluid and air also noted tracking inferiorly along the right spermatic cord, raising question for early abscess, though this may simply be postoperative.  Right-sided scrotal wall thickening suggested; would correlate clinically for any symptoms of cellulitis or abscess. 3. Somewhat irregular bladder wall thickening noted; this may reflect relative decompression, though cystoscopy could be considered for further evaluation, when and as deemed clinically appropriate. 4.  Diverticulosis along the descending and sigmoid colon, without evidence of diverticulitis. 5.  Scattered hepatic cysts seen. 6.  Cholelithiasis; gallbladder otherwise unremarkable in appearance. 7.  Mild bibasilar atelectasis noted. 8.  Mild chronic compression deformity of vertebral body T12. 9.  Scattered calcification along the abdominal aorta and its branches.   Original Report Authenticated By: Tonia Ghent, M.D.   Dg Abd Acute W/chest  10/03/2012   *RADIOLOGY REPORT*  Clinical Data: Abdominal pain, recent surgery  ACUTE ABDOMEN SERIES (ABDOMEN 2 VIEW & CHEST 1 VIEW)  Comparison: Chest radiograph 01/30/2012  Findings: Normal mediastinum  and heart silhouette.  Costophrenic angles are clear.  No effusion, infiltrate, or pneumothorax.  Left basilar atelectasis similar to prior.  No dilated loops of large or small bowel.  There is gas and stool in the rectum.  No pathologic calcifications.  IMPRESSION:  1.  Mild bibasilar atelectasis. 2.  No bowel obstruction or intraperitoneal free air.   Original Report Authenticated By: Genevive Bi, M.D.    Anti-infectives: Anti-infectives   Start     Dose/Rate Route Frequency Ordered Stop   10/03/12 1100  ciprofloxacin (CIPRO) IVPB 400 mg     400 mg 200 mL/hr over 60 Minutes Intravenous Every 12 hours 10/03/12 0934     10/03/12 0730  cefTRIAXone (ROCEPHIN) 1 g in dextrose 5 % 50 mL IVPB     1 g 100 mL/hr over 30 Minutes Intravenous  Once 10/03/12 1610 10/03/12 0805      Assessment/Plan: Will try to dc foley today Cont cipro Possibly home later  [facility] 10/04/2012

## 2012-10-04 NOTE — Progress Notes (Signed)
Patient discharged to home with instructions, verbalized understanding. 

## 2012-10-06 ENCOUNTER — Ambulatory Visit (INDEPENDENT_AMBULATORY_CARE_PROVIDER_SITE_OTHER): Payer: Medicare Other | Admitting: General Surgery

## 2012-10-06 ENCOUNTER — Encounter (INDEPENDENT_AMBULATORY_CARE_PROVIDER_SITE_OTHER): Payer: Self-pay | Admitting: General Surgery

## 2012-10-06 VITALS — BP 122/82 | HR 68 | Temp 97.5°F | Resp 16 | Ht 71.0 in | Wt 155.0 lb

## 2012-10-06 DIAGNOSIS — Z8719 Personal history of other diseases of the digestive system: Secondary | ICD-10-CM

## 2012-10-06 DIAGNOSIS — Z9889 Other specified postprocedural states: Secondary | ICD-10-CM

## 2012-10-06 LAB — URINE CULTURE

## 2012-10-06 NOTE — Progress Notes (Signed)
Subjective:     Patient ID: Mark Joseph., male   DOB: May 21, 1921, 79 y.o.   MRN: [id number]  HPI 79 yom s/p bilateral open IH repairs with mesh of 2 scrotal hernias.  Has been doing well except for inability to void requiring a foley.   I admitted him to the hospital with a uti over the weekend.  He had been doing better on flomax on proscar.  I took foley out and he has been voiding without difficulty.  He returns today doing much better without complaints.  Review of Systems     Objective:   Physical Exam Bilateral groin incisions healing well without infection    Assessment:     S/p bilateral groin hernia repair UTI     Plan:     He is doing well and voiding fine now.  Will continue to increase activity.  I will see back in about 3 weeks.

## 2012-10-12 ENCOUNTER — Ambulatory Visit: Admit: 2012-10-12 | Payer: Self-pay | Admitting: General Surgery

## 2012-10-12 DIAGNOSIS — N390 Urinary tract infection, site not specified: Secondary | ICD-10-CM | POA: Diagnosis present

## 2012-10-12 SURGERY — REPAIR, HERNIA, INGUINAL, BILATERAL, ADULT
Anesthesia: General | Laterality: Bilateral

## 2012-10-12 NOTE — Discharge Summary (Signed)
Physician Discharge Summary  Patient ID: Mark Joseph. MRN: [id number] DOB/AGE: 05-16-1921 79 y.o.  Admit date: 10/02/2012 Discharge date: 10/12/2012  Admission Diagnoses: S/p BIH repair Urinary retention requiring foley UTI  Discharge Diagnoses:  Same as above  Discharged Condition: good  Hospital Course: 79 yom who is s/p repair of bilateral inguinal hernias and developed urinary retention postop with foley in place.  He is slowly improving but then came to er for concern of some hematuria.  He has foley in place. He had a leukocytosis and a uti.  Began antibiotics and he felt back to normal following day.  I removed his catheter and he was voiding well.  Discharged home on po abx.  Consults: None  Significant Diagnostic Studies: labs: U/A and cbc  Treatments: antibiotics:     Disposition: 01-Home or Self Care   Future Appointments Provider Department Dept Phone   10/28/2012 11:00 AM Emelia Loron, MD [facility], [place] [phone]       Medication List    TAKE these medications       alendronate 70 MG tablet  Commonly known as:  FOSAMAX  Take 70 mg by mouth every 7 (seven) days. Take on Sundays. Take with a full glass of water on an empty stomach.     CALCIUM PO  Take 1 tablet by mouth 2 (two) times daily.     ciprofloxacin 500 MG tablet  Commonly known as:  CIPRO  Take 1 tablet (500 mg total) by mouth 2 (two) times daily.     clopidogrel 75 MG tablet  Commonly known as:  PLAVIX  Take 1 tablet (75 mg total) by mouth daily with breakfast.     docusate sodium 100 MG capsule  Commonly known as:  COLACE  Take 100 mg by mouth daily as needed for constipation.     lisinopril-hydrochlorothiazide 20-25 MG per tablet  Commonly known as:  PRINZIDE,ZESTORETIC  Take 1 tablet by mouth daily.     oxyCODONE 5 MG immediate release tablet  Commonly known as:  Oxy IR/ROXICODONE  Take 1-2 tablets (5-10 mg total) by mouth every 4 (four) hours as needed.      pantoprazole 40 MG tablet  Commonly known as:  PROTONIX  Take 40 mg by mouth daily.     polyethylene glycol packet  Commonly known as:  MIRALAX / GLYCOLAX  Take 17 g by mouth daily.     pravastatin 20 MG tablet  Commonly known as:  PRAVACHOL  Take 20 mg by mouth every evening.     tamsulosin 0.4 MG Caps  Commonly known as:  FLOMAX  Take 1 capsule (0.4 mg total) by mouth daily.         SignedEmelia Loron 10/12/2012, 11:12 AM

## 2012-10-28 ENCOUNTER — Encounter (INDEPENDENT_AMBULATORY_CARE_PROVIDER_SITE_OTHER): Payer: Self-pay | Admitting: General Surgery

## 2012-10-28 ENCOUNTER — Ambulatory Visit (INDEPENDENT_AMBULATORY_CARE_PROVIDER_SITE_OTHER): Payer: Medicare Other | Admitting: General Surgery

## 2012-10-28 VITALS — BP 156/100 | HR 78 | Temp 97.6°F | Resp 14 | Ht 71.0 in | Wt 155.4 lb

## 2012-10-28 DIAGNOSIS — Z09 Encounter for follow-up examination after completed treatment for conditions other than malignant neoplasm: Secondary | ICD-10-CM

## 2012-10-28 NOTE — Progress Notes (Signed)
Subjective:     Patient ID: Mark Joseph., male   DOB: [birth year], 79 y.o.   MRN: [id number]  HPI This is an 79 year old male I did a bilateral open inguinal hernia repair for a very large hernias. He is taken while taking over this. He had a catheter in place for a while. He also had a urinary tract infection. Urinary tract infection is cleared on antibiotics. He now is voiding well and his catheter is out. He returns today really doing well without any complaints.  Review of Systems     Objective:   Physical Exam Bilateral groin incisions well healed without infection    Assessment:     Status post bilateral inguinal hernia repairs History of urinary retention     Plan:     I told him he can increase his activity as tolerated now. I told them the scar tissue there present in both groins would decrease over some time also. I think he can probably stop his Flomax and I encouraged him to followup with Dr. Valentina Lucks as well for the knee pain that is now recurred I will see him back as needed

## 2013-07-22 ENCOUNTER — Encounter (HOSPITAL_COMMUNITY): Payer: Self-pay | Admitting: Emergency Medicine

## 2013-07-22 ENCOUNTER — Inpatient Hospital Stay (HOSPITAL_COMMUNITY)
Admission: EM | Admit: 2013-07-22 | Discharge: 2013-07-27 | DRG: 310 | Disposition: A | Discharge status: Rehabilitation Facility | Payer: Medicare Other | Attending: Cardiology | Admitting: Cardiology

## 2013-07-22 ENCOUNTER — Emergency Department (HOSPITAL_COMMUNITY): Payer: Medicare Other

## 2013-07-22 DIAGNOSIS — I471 Supraventricular tachycardia, unspecified: Principal | ICD-10-CM | POA: Diagnosis present

## 2013-07-22 DIAGNOSIS — I639 Cerebral infarction, unspecified: Secondary | ICD-10-CM

## 2013-07-22 DIAGNOSIS — K219 Gastro-esophageal reflux disease without esophagitis: Secondary | ICD-10-CM | POA: Diagnosis present

## 2013-07-22 DIAGNOSIS — M1710 Unilateral primary osteoarthritis, unspecified knee: Secondary | ICD-10-CM | POA: Diagnosis present

## 2013-07-22 DIAGNOSIS — I10 Essential (primary) hypertension: Secondary | ICD-10-CM | POA: Diagnosis present

## 2013-07-22 DIAGNOSIS — I4891 Unspecified atrial fibrillation: Secondary | ICD-10-CM | POA: Diagnosis present

## 2013-07-22 DIAGNOSIS — Z8719 Personal history of other diseases of the digestive system: Secondary | ICD-10-CM

## 2013-07-22 DIAGNOSIS — R339 Retention of urine, unspecified: Secondary | ICD-10-CM | POA: Diagnosis not present

## 2013-07-22 DIAGNOSIS — N390 Urinary tract infection, site not specified: Secondary | ICD-10-CM

## 2013-07-22 DIAGNOSIS — Z7902 Long term (current) use of antithrombotics/antiplatelets: Secondary | ICD-10-CM

## 2013-07-22 DIAGNOSIS — T148XXA Other injury of unspecified body region, initial encounter: Secondary | ICD-10-CM

## 2013-07-22 DIAGNOSIS — Z8249 Family history of ischemic heart disease and other diseases of the circulatory system: Secondary | ICD-10-CM

## 2013-07-22 DIAGNOSIS — I498 Other specified cardiac arrhythmias: Secondary | ICD-10-CM | POA: Diagnosis present

## 2013-07-22 DIAGNOSIS — I480 Paroxysmal atrial fibrillation: Secondary | ICD-10-CM

## 2013-07-22 DIAGNOSIS — Z9889 Other specified postprocedural states: Secondary | ICD-10-CM

## 2013-07-22 DIAGNOSIS — IMO0002 Reserved for concepts with insufficient information to code with codable children: Secondary | ICD-10-CM

## 2013-07-22 DIAGNOSIS — I4892 Unspecified atrial flutter: Secondary | ICD-10-CM | POA: Diagnosis present

## 2013-07-22 DIAGNOSIS — K5909 Other constipation: Secondary | ICD-10-CM | POA: Diagnosis present

## 2013-07-22 DIAGNOSIS — N400 Enlarged prostate without lower urinary tract symptoms: Secondary | ICD-10-CM | POA: Diagnosis present

## 2013-07-22 DIAGNOSIS — K5900 Constipation, unspecified: Secondary | ICD-10-CM | POA: Diagnosis present

## 2013-07-22 DIAGNOSIS — Z79899 Other long term (current) drug therapy: Secondary | ICD-10-CM

## 2013-07-22 DIAGNOSIS — E7800 Pure hypercholesterolemia, unspecified: Secondary | ICD-10-CM | POA: Diagnosis present

## 2013-07-22 DIAGNOSIS — Z8673 Personal history of transient ischemic attack (TIA), and cerebral infarction without residual deficits: Secondary | ICD-10-CM

## 2013-07-22 DIAGNOSIS — M810 Age-related osteoporosis without current pathological fracture: Secondary | ICD-10-CM | POA: Diagnosis present

## 2013-07-22 DIAGNOSIS — Z87891 Personal history of nicotine dependence: Secondary | ICD-10-CM

## 2013-07-22 DIAGNOSIS — D72829 Elevated white blood cell count, unspecified: Secondary | ICD-10-CM

## 2013-07-22 LAB — COMPREHENSIVE METABOLIC PANEL
ALK PHOS: 44 U/L (ref 39–117)
ALT: 21 U/L (ref 0–53)
AST: 28 U/L (ref 0–37)
Albumin: 3.5 g/dL (ref 3.5–5.2)
BUN: 19 mg/dL (ref 6–23)
CHLORIDE: 94 meq/L — AB (ref 96–112)
CO2: 28 meq/L (ref 19–32)
Calcium: 9.5 mg/dL (ref 8.4–10.5)
Creatinine, Ser: 0.86 mg/dL (ref 0.50–1.35)
GFR, EST AFRICAN AMERICAN: 87 mL/min — AB (ref >90)
GFR, EST NON AFRICAN AMERICAN: 75 mL/min — AB (ref >90)
GLUCOSE: 123 mg/dL — AB (ref 70–99)
POTASSIUM: 4.6 meq/L (ref 3.7–5.3)
SODIUM: 135 meq/L — AB (ref 137–147)
Total Bilirubin: 0.5 mg/dL (ref 0.3–1.2)
Total Protein: 6.5 g/dL (ref 6.0–8.3)

## 2013-07-22 LAB — TROPONIN I: Troponin I: <0.3 ng/mL (ref <0.30)

## 2013-07-22 LAB — CBC WITH DIFFERENTIAL/PLATELET
Basophils Absolute: 0 10*3/uL (ref 0.0–0.1)
Basophils Relative: 1 % (ref 0–1)
Eosinophils Absolute: 0.1 10*3/uL (ref 0.0–0.7)
Eosinophils Relative: 2 % (ref 0–5)
HCT: 33.9 % — ABNORMAL LOW (ref 39.0–52.0)
Hemoglobin: 12.1 g/dL — ABNORMAL LOW (ref 13.0–17.0)
LYMPHS ABS: 1.2 10*3/uL (ref 0.7–4.0)
LYMPHS PCT: 16 % (ref 12–46)
MCH: 32.7 pg (ref 26.0–34.0)
MCHC: 35.7 g/dL (ref 30.0–36.0)
MCV: 91.6 fL (ref 78.0–100.0)
Monocytes Absolute: 0.6 10*3/uL (ref 0.1–1.0)
Monocytes Relative: 9 % (ref 3–12)
NEUTROS ABS: 5.3 10*3/uL (ref 1.7–7.7)
NEUTROS PCT: 73 % (ref 43–77)
PLATELETS: 260 10*3/uL (ref 150–400)
RBC: 3.7 MIL/uL — AB (ref 4.22–5.81)
RDW: 12.7 % (ref 11.5–15.5)
WBC: 7.3 10*3/uL (ref 4.0–10.5)

## 2013-07-22 LAB — MAGNESIUM: MAGNESIUM: 1.8 mg/dL (ref 1.5–2.5)

## 2013-07-22 MED ORDER — POLYETHYLENE GLYCOL 3350 17 G PO PACK
17.0000 g | PACK | Freq: Every day | ORAL | Status: DC
Start: 2013-07-22 — End: 2013-07-27
  Administered 2013-07-25 – 2013-07-27 (×3): 17 g via ORAL
  Filled 2013-07-22 (×5): qty 1

## 2013-07-22 MED ORDER — LISINOPRIL 20 MG PO TABS
20.0000 mg | ORAL_TABLET | Freq: Every day | ORAL | Status: DC
  Administered 2013-07-23: 20 mg via ORAL
  Filled 2013-07-22: qty 1

## 2013-07-22 MED ORDER — ALENDRONATE SODIUM 70 MG PO TABS: 70.0000 mg | ORAL_TABLET | ORAL | Status: DC

## 2013-07-22 MED ORDER — FINASTERIDE 5 MG PO TABS
5.0000 mg | ORAL_TABLET | Freq: Every day | ORAL | Status: DC
  Administered 2013-07-23 – 2013-07-27 (×5): 5 mg via ORAL
  Filled 2013-07-22 (×5): qty 1

## 2013-07-22 MED ORDER — LIDOCAINE 5 % EX PTCH
1.0000 | MEDICATED_PATCH | CUTANEOUS | Status: DC
Start: 2013-07-22 — End: 2013-07-27
  Administered 2013-07-22 – 2013-07-26 (×5): 1 via TRANSDERMAL
  Filled 2013-07-22 (×6): qty 1

## 2013-07-22 MED ORDER — SODIUM CHLORIDE 0.9 % IJ SOLN: 3.0000 mL | INTRAMUSCULAR | Status: DC | PRN

## 2013-07-22 MED ORDER — SODIUM CHLORIDE 0.9 % IV SOLN: 250.0000 mL | INTRAVENOUS | Status: DC | PRN

## 2013-07-22 MED ORDER — METOPROLOL TARTRATE 1 MG/ML IV SOLN
5.0000 mg | INTRAVENOUS | Status: DC | PRN
  Administered 2013-07-23 – 2013-07-24 (×2): 5 mg via INTRAVENOUS
  Filled 2013-07-22 (×2): qty 5

## 2013-07-22 MED ORDER — DEXTROSE 5 % IV SOLN
5.0000 mg/h | INTRAVENOUS | Status: DC
  Administered 2013-07-22: 5 mg/h via INTRAVENOUS

## 2013-07-22 MED ORDER — LISINOPRIL-HYDROCHLOROTHIAZIDE 20-25 MG PO TABS: 1.0000 | ORAL_TABLET | Freq: Every day | ORAL | Status: DC

## 2013-07-22 MED ORDER — SIMVASTATIN 10 MG PO TABS
10.0000 mg | ORAL_TABLET | Freq: Every day | ORAL | Status: DC
  Administered 2013-07-22 – 2013-07-26 (×5): 10 mg via ORAL
  Filled 2013-07-22 (×6): qty 1

## 2013-07-22 MED ORDER — METOPROLOL TARTRATE 25 MG PO TABS
25.0000 mg | ORAL_TABLET | Freq: Four times a day (QID) | ORAL | Status: DC
Start: 2013-07-22 — End: 2013-07-23
  Administered 2013-07-22 – 2013-07-23 (×3): 25 mg via ORAL
  Filled 2013-07-22 (×7): qty 1

## 2013-07-22 MED ORDER — SELENIUM 50 MCG PO TABS
200.0000 ug | ORAL_TABLET | Freq: Every day | ORAL | Status: DC
  Administered 2013-07-23: 200 ug via ORAL
  Filled 2013-07-22 (×5): qty 4

## 2013-07-22 MED ORDER — TAMSULOSIN HCL 0.4 MG PO CAPS
0.4000 mg | ORAL_CAPSULE | Freq: Every day | ORAL | Status: DC
  Administered 2013-07-23 – 2013-07-27 (×5): 0.4 mg via ORAL
  Filled 2013-07-22 (×5): qty 1

## 2013-07-22 MED ORDER — HYDROCHLOROTHIAZIDE 25 MG PO TABS
25.0000 mg | ORAL_TABLET | Freq: Every day | ORAL | Status: DC
  Administered 2013-07-23: 25 mg via ORAL
  Filled 2013-07-22: qty 1

## 2013-07-22 MED ORDER — APIXABAN 5 MG PO TABS
5.0000 mg | ORAL_TABLET | Freq: Two times a day (BID) | ORAL | Status: DC
  Administered 2013-07-22 – 2013-07-27 (×11): 5 mg via ORAL
  Filled 2013-07-22 (×13): qty 1

## 2013-07-22 MED ORDER — SODIUM CHLORIDE 0.9 % IJ SOLN
3.0000 mL | Freq: Two times a day (BID) | INTRAMUSCULAR | Status: DC
  Administered 2013-07-22 – 2013-07-23 (×2): 3 mL via INTRAVENOUS

## 2013-07-22 MED ORDER — TAMSULOSIN HCL 0.4 MG PO CAPS
0.4000 mg | ORAL_CAPSULE | Freq: Every day | ORAL | Status: DC
  Filled 2013-07-22: qty 1

## 2013-07-22 MED ORDER — SODIUM CHLORIDE 0.9 % IV SOLN
Freq: Once | INTRAVENOUS | Status: AC
  Administered 2013-07-22: 10:00:00 via INTRAVENOUS

## 2013-07-22 MED ORDER — PANTOPRAZOLE SODIUM 40 MG PO TBEC
40.0000 mg | DELAYED_RELEASE_TABLET | Freq: Every day | ORAL | Status: DC
  Administered 2013-07-23 – 2013-07-27 (×5): 40 mg via ORAL
  Filled 2013-07-22 (×5): qty 1

## 2013-07-22 NOTE — H&P (Signed)
Patient seen and examined.  Agree with note as stated above by PA.  Admitted to ER with constipation and found to be in afib with RVR of unknown duration.  He has no prior cardiac history except RBBB noted on prior EKGs.  He has a history of CVA in 2013.  I suspect asymptomatic PAF was etiology of CVA.  He appear stable for NOAC agent and would recommend this given high CHADS2Vasc score.  Admit for rate control of afib with beta blocker therapy.  Check TSH.  2D echo to assess LVF.

## 2013-07-22 NOTE — ED Notes (Signed)
Pt assisted to bedside commode by emt. Pt successful in moving bowels. Moderate amount of dark stool.

## 2013-07-22 NOTE — ED Notes (Addendum)
Pt from home, brought in by [facility] ems. Pt has not had bowel movement since Tuesday per ems. Pt has been on pain medications for previous fall. By abd distended and pt passing flatus. Vital signs per ems bp 140/90 hr 88 rr 18 pain 5/10.

## 2013-07-22 NOTE — ED Notes (Signed)
MD at bedside. 

## 2013-07-22 NOTE — ED Notes (Signed)
Delo MD notified of pt heart rate. Delo MD acknowledges and states he will let cardiology resume management.

## 2013-07-22 NOTE — Progress Notes (Signed)
PHARMACIST - PHYSICIAN COMMUNICATION  CONCERNING: P&T Medication Policy Regarding Oral Bisphosphonates  RECOMMENDATION: Your order for alendronate (Fosamax), ibandronate (Boniva), or risedronate (Actonel) has been discontinued at this time.  If the patient's post-hospital medical condition warrants safe use of this class of drugs, please resume the pre-hospital regimen upon discharge.  DESCRIPTION:  Alendronate (Fosamax), ibandronate (Boniva), and risedronate (Actonel) can cause severe esophageal erosions in patients who are unable to remain upright at least 30 minutes after taking this medication.   Since brief interruptions in therapy are thought to have minimal impact on bone mineral density, the Pharmacy & Therapeutics Committee has established that bisphosphonate orders should be routinely discontinued during hospitalization.   To override this safety policy and permit administration of Boniva, Fosamax, or Actonel in the hospital, prescribers must write "DO NOT HOLD" in the comments section when placing the order for this class of medications.  Christoper Fabianaron Shaunda Tipping, PharmD, BCPS Clinical pharmacist, pager [phone] 07/22/2013 4:59 PM

## 2013-07-22 NOTE — ED Notes (Signed)
Patient transported to X-ray 

## 2013-07-22 NOTE — ED Notes (Signed)
Patient back from x-ray 

## 2013-07-22 NOTE — ED Provider Notes (Signed)
CSN: [id number]     Arrival date & time 07/22/13  0841 History   First MD Initiated Contact with Patient 07/22/13 [id number]     Chief Complaint  Patient presents with  . Constipation     (Consider location/radiation/quality/duration/timing/severity/associated sxs/prior Treatment) HPI Comments: Patient is an 80 year old male presents with complaints of abdominal cramping and constipation. He has been taking oxycodone for a compression fracture which was diagnosed last week. He has not had a bowel movement in 4 days. He denies any vomiting. He does admit to passing flatus.  Patient is a 80 y.o. male presenting with constipation. The history is provided by the patient.  Constipation Severity:  Moderate Time since last bowel movement:  4 days Timing:  Constant Progression:  Worsening Chronicity:  New Context: medication and narcotics   Stool description:  None produced Relieved by:  Nothing Worsened by:  Nothing tried Ineffective treatments:  None tried Associated symptoms: abdominal pain     Past Medical History  Diagnosis Date  . Hypertension   . GERD (gastroesophageal reflux disease)   . Osteoporosis   . High cholesterol   . Stroke 01/2012    "small; affected his speech; pretty much back to normal now" (09/15/2012)  . Pneumonitis 1971  . Arthritis     "knees; right is worse" (09/15/2012)   Past Surgical History  Procedure Laterality Date  . Appendectomy  1953  . Inguinal hernia repair Bilateral 09/15/2012    w/mesh Hattie Perch/notes 09/15/2012  . Inguinal hernia repair Bilateral 09/15/2012    Procedure: HERNIA REPAIR INGUINAL ADULT BILATERAL;  Surgeon: Emelia LoronMatthew Wakefield, MD;  [facility];  Service: General;  Laterality: Bilateral;  . Insertion of mesh Bilateral 09/15/2012    Procedure: INSERTION OF MESH;  Surgeon: Emelia LoronMatthew Wakefield, MD;  [facility];  Service: General;  Laterality: Bilateral;   History reviewed. No pertinent family history. History  Substance Use Topics  . Smoking  status: Former Smoker -- 0.50 packs/day for 25 years    Types: Cigarettes    Quit date: 05/11/1968  . Smokeless tobacco: Never Used  . Alcohol Use: No    Review of Systems  Gastrointestinal: Positive for abdominal pain and constipation.  All other systems reviewed and are negative.      Allergies  Review of patient's allergies indicates no known allergies.  Home Medications   Current Outpatient Rx  Name  Route  Sig  Dispense  Refill  . CALCIUM PO   Oral   Take 1 tablet by mouth 2 (two) times daily.         . clopidogrel (PLAVIX) 75 MG tablet   Oral   Take 1 tablet (75 mg total) by mouth daily with breakfast.   30 tablet   11   . lisinopril-hydrochlorothiazide (PRINZIDE,ZESTORETIC) 20-25 MG per tablet   Oral   Take 1 tablet by mouth daily.         . pantoprazole (PROTONIX) 40 MG tablet   Oral   Take 40 mg by mouth daily.         . polyethylene glycol (MIRALAX / GLYCOLAX) packet   Oral   Take 17 g by mouth daily.         . pravastatin (PRAVACHOL) 20 MG tablet   Oral   Take 20 mg by mouth every evening.         . tamsulosin (FLOMAX) 0.4 MG CAPS   Oral   Take 1 capsule (0.4 mg total) by mouth daily.   30 capsule  1   . alendronate (FOSAMAX) 70 MG tablet   Oral   Take 70 mg by mouth every 7 (seven) days. Take on Sundays. Take with a full glass of water on an empty stomach.          BP 126/91  Pulse 157  Temp(Src) 97.9 F (36.6 C) (Oral)  SpO2 94% Physical Exam  Nursing note and vitals reviewed. Constitutional: He is oriented to person, place, and time.  Patient is an elderly male in no acute distress. He is awake, alert, and appropriate.  HENT:  Head: Normocephalic and atraumatic.  Neck: Normal range of motion. Neck supple.  Cardiovascular:  Heart rate is rapid and irregular.  Pulmonary/Chest: Effort normal and breath sounds normal. No respiratory distress. He has no wheezes.  Abdominal: Bowel sounds are normal. He exhibits no  distension. There is tenderness.  There is mild left-sided tenderness to palpation. There is no rebound and no guarding.  Musculoskeletal: Normal range of motion. He exhibits no edema.  Neurological: He is alert and oriented to person, place, and time.  Skin: Skin is warm and dry.    ED Course  Procedures (including critical care time) Labs Review Labs Reviewed  CBC WITH DIFFERENTIAL  COMPREHENSIVE METABOLIC PANEL  TROPONIN I   Imaging Review No results found.   EKG Interpretation   Date/Time:  Saturday July 22 2013 09:07:01 EDT Ventricular Rate:  161 PR Interval:  172 QRS Duration: 138 QT Interval:  327 QTC Calculation: 535 R Axis:   -89 Text Interpretation:  tachycardia RBBB and LAFB Confirmed by DELOS  MD,  Jannis Atkins ([id number]) on 07/22/2013 9:23:09 AM      MDM   Final diagnoses:  None    Patient is a 15-year-old male who initially presented to the ER with complaints of constipation. He's been taking pain medication for a compression fracture in his back. While in the ER, he was found to have a heart rate of 160 and it appears as though he is having paroxysmal atrial fibrillation. He was started on a Cardizem drip which has not slowed his rate much. Workup is unremarkable including electrolytes and troponin. I discussed the case with Dr. Mayford Knife from cardiology who will admit the patient for rate control and anticoagulation.  CRITICAL CARE Performed by: Geoffery Lyons Total critical care time: 45 minutes Critical care time was exclusive of separately billable procedures and treating other patients. Critical care was necessary to treat or prevent imminent or life-threatening deterioration. Critical care was time spent personally by me on the following activities: development of treatment plan with patient and/or surrogate as well as nursing, discussions with consultants, evaluation of patient's response to treatment, examination of patient, obtaining history from patient or  surrogate, ordering and performing treatments and interventions, ordering and review of laboratory studies, ordering and review of radiographic studies, pulse oximetry and re-evaluation of patient's condition.     Geoffery Lyons, MD 07/22/13 [phone]

## 2013-07-22 NOTE — Progress Notes (Signed)
Resp Care Note;Pt placed on Pulseox for overnight study.Currently on 2 lpm with sats at  97%.

## 2013-07-22 NOTE — ED Notes (Signed)
Cardiology at bedside.

## 2013-07-22 NOTE — H&P (Cosign Needed Addendum)
History and Physical   Patient ID: Mark Joseph. MRN: [id number], DOB/AGE: 1921-11-21   Admit date: 07/22/2013 Date of Consult: 07/22/2013   Primary Physician: Lillia Mountain, MD Primary Cardiologist: New   HPI: Mark Phariss. is a 80 y.o. male w/ no prior cardiac history, PMHx s/f CVA in 2013, HTN, HLD, GERD, recent compression fracture who presents to [facility] ED for constipation, abdominal pain- incidentally found to have new onset atrial fibrillation w/ RVR.   He suffered a CVA in 01/2012 manifested as slurred speech and incoordination. MRI showed a small L-sided infarct. 2D echo revealed EF 45-50%, PASP 43 mmHg, no PFO. He symptoms improved to mild residual dysarthria. He was discharged on Plavix.   He is a very functional 80yo at baseline. He goes to the [facility] daily to exercise on the seated bike. He was carrying a box onto his porch last week when he missed a step and unfortunately fell backwards striking his tailbone. Follow-up imaging revealed a small L2 fracture. He was started on oxycodone for analgesia. For the past 4 days, he has been constipated and developed abdominal distention and pain for which he presented to the ED today.   There, EKG revealed a wide-complex, fairly regular appearing tachycardia, HR 161 bpm (ddx: SVT, a-fib w/ RVR with underlying conduction delay). Prior EKGs reveal NSR w/ conduction defects in RBBB, LAFB and subsequent QRS prolongation. Telemetry review indicated atrial fibrillation w/ RVR. Abdominal XR revealed no air-fluid levels and cholelithiasis. CMP, CBC largely WNL. TnI WNL.   He was started on Cardizem. HR remained elevated in 160s. He had a large bowel movement in the ED w/ improvement in abdominal discomfort. HR subsequently dropped to 80-90s.   He denies chest pain, SOB/DOE, palpitations, lightheadedness, fatigue, syncope, PND, orthopnea, LE edema, weight gain or abdominal distention. He does snore, wakes up gasping for breath at  night.  Problem List: Past Medical History  Diagnosis Date  . Hypertension   . GERD (gastroesophageal reflux disease)   . Osteoporosis   . High cholesterol   . Stroke 01/2012    "small; affected his speech; pretty much back to normal now" (09/15/2012)  . Pneumonitis 1971  . Arthritis     "knees; right is worse" (09/15/2012)    Past Surgical History  Procedure Laterality Date  . Appendectomy  1953  . Inguinal hernia repair Bilateral 09/15/2012    w/mesh Hattie Perch 09/15/2012  . Inguinal hernia repair Bilateral 09/15/2012    Procedure: HERNIA REPAIR INGUINAL ADULT BILATERAL;  Surgeon: Emelia Loron, MD;  Location: [facility] OR;  Service: General;  Laterality: Bilateral;  . Insertion of mesh Bilateral 09/15/2012    Procedure: INSERTION OF MESH;  Surgeon: Emelia Loron, MD;  Location: [facility] OR;  Service: General;  Laterality: Bilateral;     Allergies: No Known Allergies  Home Medications: Prior to Admission medications   Medication Sig Start Date End Date Taking? Authorizing Provider  alendronate (FOSAMAX) 70 MG tablet Take 70 mg by mouth every 7 (seven) days. Take on Sundays. Take with a full glass of water on an empty stomach.   Yes Historical Provider, MD  CALCIUM PO Take 1 tablet by mouth 2 (two) times daily.   Yes Historical Provider, MD  clopidogrel (PLAVIX) 75 MG tablet Take 1 tablet (75 mg total) by mouth daily with breakfast. 02/02/12  Yes Lillia Mountain, MD  finasteride (PROSCAR) 5 MG tablet Take 5 mg by mouth daily.   Yes Historical Provider, MD  lidocaine (LIDODERM)  5 % Place 1 patch onto the skin daily. Remove & Discard patch within 12 hours or as directed by MD   Yes Historical Provider, MD  lisinopril-hydrochlorothiazide (PRINZIDE,ZESTORETIC) 20-25 MG per tablet Take 1 tablet by mouth daily.   Yes Historical Provider, MD  pantoprazole (PROTONIX) 40 MG tablet Take 40 mg by mouth daily.   Yes Historical Provider, MD  polyethylene glycol (MIRALAX / GLYCOLAX) packet Take 17 g by mouth  daily.   Yes Historical Provider, MD  pravastatin (PRAVACHOL) 20 MG tablet Take 20 mg by mouth every evening.   Yes Historical Provider, MD  selenium 50 MCG TABS tablet Take 200 mcg by mouth daily.   Yes Historical Provider, MD  tamsulosin (FLOMAX) 0.4 MG CAPS Take 1 capsule (0.4 mg total) by mouth daily. 09/17/12  Yes Ernestene Mention, MD  traMADol (ULTRAM) 50 MG tablet Take 50 mg by mouth every 6 (six) hours as needed.   Yes Historical Provider, MD    Inpatient Medications:     (Not in a hospital admission)  Family history: denies family history of cardiac disease.   History   Social History  . Marital Status: Married    Spouse Name: N/A    Number of Children: N/A  . Years of Education: N/A   Occupational History  . Not on file.   Social History Main Topics  . Smoking status: Former Smoker -- 0.50 packs/day for 25 years    Types: Cigarettes    Quit date: 05/11/1968  . Smokeless tobacco: Never Used  . Alcohol Use: No  . Drug Use: No  . Sexual Activity: Not on file   Other Topics Concern  . Not on file   Social History Narrative  . No narrative on file     Review of Systems: General: negative for chills, fever, night sweats or weight changes.  Cardiovascular: negative for chest pain, dyspnea on exertion, edema, orthopnea, palpitations, paroxysmal nocturnal dyspnea or shortness of breath Dermatological: negative for rash Respiratory: negative for cough or wheezing Urologic: negative for hematuria Abdominal: positive for constipation, abdominal pain, negative for nausea, vomiting, diarrhea, bright red blood per rectum, melena, or hematemesis Neurologic:  negative for visual changes, syncope, or dizziness All other systems reviewed and are otherwise negative except as noted above.  Physical Exam: Blood pressure 103/64, pulse 69, temperature 97.9 F (36.6 C), temperature source Oral, resp. rate 23, SpO2 97.00%.   General: Well developed, thin, elderly-appearing male,  in no acute distress. Head: Normocephalic, atraumatic, sclera non-icteric, no xanthomas, nares are without discharge.  Neck: Negative for carotid bruits. JVD not elevated. Lungs: Clear bilaterally to auscultation without wheezes, rales, or rhonchi. Breathing is unlabored. Heart: Irregularly irregular, with S1 S2. No murmurs, rubs, or gallops appreciated. Abdomen: Soft, non-tender, non-distended with normoactive bowel sounds. No hepatomegaly. No rebound/guarding. No obvious abdominal masses. Msk:  Strength and tone appears normal for age. Extremities: No clubbing, cyanosis or edema.  Distal pedal pulses are 2+ and equal bilaterally. Neuro: Alert and oriented X 3. Moves all extremities spontaneously. Psych:  Responds to questions appropriately with a normal affect.  Labs: Recent Labs     07/22/13  0923  WBC  7.3  HGB  12.1*  HCT  33.9*  MCV  91.6  PLT  260   Recent Labs Lab 07/22/13 0923  NA 135*  K 4.6  CL 94*  CO2 28  BUN 19  CREATININE 0.86  CALCIUM 9.5  PROT 6.5  BILITOT 0.5  ALKPHOS 44  ALT 21  AST 28  GLUCOSE 123*   Recent Labs     07/22/13  [id number]  TROPONINI  <0.30   Radiology/Studies: Dg Abd Acute W/chest  07/22/2013   CLINICAL DATA:  Constipation  EXAM: ACUTE ABDOMEN SERIES (ABDOMEN 2 VIEW & CHEST 1 VIEW)  COMPARISON:  10/03/2012  FINDINGS: There is no evidence of dilated bowel loops or free intraperitoneal air. Gallstones noted. Thoracic aortic tortuosity. No cardiomegaly. Mild basilar atelectasis or scarring. No edema or consolidation. No effusion or pneumothorax. L2 compression deformity, known from radiography 07/14/2013.  IMPRESSION: 1. Negative for bowel obstruction or perforation. 2. Cholelithiasis.   Electronically Signed   By: Tiburcio PeaJonathan  Watts M.D.   On: 07/22/2013 10:53   ASSESSMENT AND PLAN:   1. New onset atrial fibrillation w/ RVR 2. H/o CVA 3. Possible SVT  4. RBBB, LAFB 5. HTN 6. HLD 7. Possible OSA 8. Recent mechanical fall w/ traumatic L2  fracture 9. Constipation 10. GERD  The patient presents w/ newly recognized atrial fibrillation w/ RVR. Suspect this has been paroxysmal and unrecognized for some time, and likely the etiology of his CVA in 2013. He may have occult OSA at baseline. He has conduction disease. Elderly status may also contribute to propensity for atrial arrhythmias. Suspect RVR in the setting of pain, recent fracture, constipation. Rates much better improved after BM. He is a very functional gentleman. He falls infrequently. His most recent fall was mechanical. CHA2DS2VASc = 5 (HTN, age > 6775, CVA). Favor starting NOAC such as Eliquis given reduced bleeding risk. Stop Plavix. Pursue rate-control strategy for now-- he is tolerating rhythm well. He is asymptomatic and HD stable. Would avoid CCBs w/ given constipation to avoid GI effect in this class. Wean Cardizem gtt and start Lopressor 25mg  PO q6hr w/ Lopressor IV PRN orders for HR > 120. Observe overnight in telemetry with aim to consolidate BB dose. Obtain 2D echo, monitor nocturnal O2 sat's and check TSH, free T4.    Signed, R. Hurman Hornony Lillian Tigges, PA-C 07/22/2013, 1:17 PM

## 2013-07-23 ENCOUNTER — Other Ambulatory Visit: Payer: Self-pay

## 2013-07-23 DIAGNOSIS — I369 Nonrheumatic tricuspid valve disorder, unspecified: Secondary | ICD-10-CM

## 2013-07-23 DIAGNOSIS — I6350 Cerebral infarction due to unspecified occlusion or stenosis of unspecified cerebral artery: Secondary | ICD-10-CM

## 2013-07-23 LAB — TSH: TSH: 1.84 u[IU]/mL (ref 0.350–4.500)

## 2013-07-23 LAB — T4, FREE: Free T4: 1.4 ng/dL (ref 0.80–1.80)

## 2013-07-23 MED ORDER — HYDROCHLOROTHIAZIDE 12.5 MG PO CAPS
12.5000 mg | ORAL_CAPSULE | Freq: Every day | ORAL | Status: DC
  Administered 2013-07-24 – 2013-07-27 (×4): 12.5 mg via ORAL
  Filled 2013-07-23 (×5): qty 1

## 2013-07-23 MED ORDER — LISINOPRIL 20 MG PO TABS: 20.0000 mg | ORAL_TABLET | Freq: Every day | ORAL | Status: DC

## 2013-07-23 MED ORDER — HYDROCHLOROTHIAZIDE 12.5 MG PO CAPS: 12.5000 mg | ORAL_CAPSULE | Freq: Every day | ORAL | Status: DC

## 2013-07-23 MED ORDER — METOPROLOL TARTRATE 50 MG PO TABS
50.0000 mg | ORAL_TABLET | Freq: Two times a day (BID) | ORAL | Status: DC
  Administered 2013-07-23 – 2013-07-27 (×9): 50 mg via ORAL
  Filled 2013-07-23 (×10): qty 1

## 2013-07-23 MED ORDER — METOPROLOL TARTRATE 50 MG PO TABS
50.0000 mg | ORAL_TABLET | Freq: Two times a day (BID) | ORAL | Status: DC
  Filled 2013-07-23: qty 1

## 2013-07-23 MED ORDER — LISINOPRIL 10 MG PO TABS
10.0000 mg | ORAL_TABLET | Freq: Every day | ORAL | Status: DC
  Administered 2013-07-24 – 2013-07-27 (×4): 10 mg via ORAL
  Filled 2013-07-23 (×4): qty 1

## 2013-07-23 NOTE — Progress Notes (Signed)
Foley catheter removed, pt tolerated well; reminded pt to let staff know when he voids

## 2013-07-23 NOTE — Progress Notes (Signed)
Subjective: Denies palpitations  No SOB at rest.   Objective: Filed Vitals:   07/22/13 1747 07/22/13 2125 07/22/13 2330 07/23/13 0539  BP: 112/68 139/87 126/84 122/75  Pulse: 76 79 77 65  Temp:  98.8 F (37.1 C)  98.5 F (36.9 C)  TempSrc:  Oral  Oral  Resp:  20  18  Height:      Weight:      SpO2:  97% 97% 95%   Weight change:   Intake/Output Summary (Last 24 hours) at 07/23/13 0906 Last data filed at 07/23/13 0500  Gross per 24 hour  Intake     60 ml  Output   1556 ml  Net  -1496 ml    General: Alert, awake, oriented x3, in no acute distress Neck:  JVP is normal Heart: Regular rate and rhythm, without murmurs, rubs, gallops.  Lungs: Clear to auscultation.  No rales or wheezes. Exemities:  No edema.   Neuro: Grossly intact, nonfocal.  Tele:  Curr SR Lab Results: Results for orders placed during the hospital encounter of 07/22/13 (from the past 24 hour(s))  CBC WITH DIFFERENTIAL     Status: Abnormal   Collection Time    07/22/13  9:23 AM      Result Value Ref Range   WBC 7.3  4.0 - 10.5 K/uL   RBC 3.70 (*) 4.22 - 5.81 MIL/uL   Hemoglobin 12.1 (*) 13.0 - 17.0 g/dL   HCT 16.133.9 (*) 09.639.0 - 04.552.0 %   MCV 91.6  78.0 - 100.0 fL   MCH 32.7  26.0 - 34.0 pg   MCHC 35.7  30.0 - 36.0 g/dL   RDW 40.912.7  81.111.5 - 91.415.5 %   Platelets 260  150 - 400 K/uL   Neutrophils Relative % 73  43 - 77 %   Neutro Abs 5.3  1.7 - 7.7 K/uL   Lymphocytes Relative 16  12 - 46 %   Lymphs Abs 1.2  0.7 - 4.0 K/uL   Monocytes Relative 9  3 - 12 %   Monocytes Absolute 0.6  0.1 - 1.0 K/uL   Eosinophils Relative 2  0 - 5 %   Eosinophils Absolute 0.1  0.0 - 0.7 K/uL   Basophils Relative 1  0 - 1 %   Basophils Absolute 0.0  0.0 - 0.1 K/uL  COMPREHENSIVE METABOLIC PANEL     Status: Abnormal   Collection Time    07/22/13  9:23 AM      Result Value Ref Range   Sodium 135 (*) 137 - 147 mEq/L   Potassium 4.6  3.7 - 5.3 mEq/L   Chloride 94 (*) 96 - 112 mEq/L   CO2 28  19 - 32 mEq/L   Glucose, Bld 123 (*)  70 - 99 mg/dL   BUN 19  6 - 23 mg/dL   Creatinine, Ser 7.820.86  0.50 - 1.35 mg/dL   Calcium 9.5  8.4 - 95.610.5 mg/dL   Total Protein 6.5  6.0 - 8.3 g/dL   Albumin 3.5  3.5 - 5.2 g/dL   AST 28  0 - 37 U/L   ALT 21  0 - 53 U/L   Alkaline Phosphatase 44  39 - 117 U/L   Total Bilirubin 0.5  0.3 - 1.2 mg/dL   GFR calc non Af Amer 75 (*) >90 mL/min   GFR calc Af Amer 87 (*) >90 mL/min  TROPONIN I     Status: None   Collection Time  07/22/13  9:23 AM      Result Value Ref Range   Troponin I <0.30  <0.30 ng/mL  TSH     Status: None   Collection Time    07/22/13  6:21 PM      Result Value Ref Range   TSH 1.840  0.350 - 4.500 uIU/mL  T4, FREE     Status: None   Collection Time    07/22/13  6:21 PM      Result Value Ref Range   Free T4 1.40  0.80 - 1.80 ng/dL  TROPONIN I     Status: None   Collection Time    07/22/13  6:21 PM      Result Value Ref Range   Troponin I <0.30  <0.30 ng/mL  MAGNESIUM     Status: None   Collection Time    07/22/13  6:21 PM      Result Value Ref Range   Magnesium 1.8  1.5 - 2.5 mg/dL  TROPONIN I     Status: None   Collection Time    07/22/13 10:24 PM      Result Value Ref Range   Troponin I <0.30  <0.30 ng/mL    Studies/Results: @RISRSLT24 @  Medications:  REviewed   @PROBHOSP @  1.  Paroxysmal Atrial fib.  Currently in SR  Patient does not sense any palpitations. TSH normal I have reviewed echo  He has some mild to mod LV and RV dysfunction.  THis may be tachy induced though may rep CAD  Patient denies chest pains. Reasonable to follow as outpatient  He is now on eliquis  Says he rarely falls.  I would recomm he ambulate with assist to deomonstrate  2.  Urol  Patient with known BPH  Last night could not go.  Had to have foley cath inserted. WOuld d/c and follow  Continue meds.  May need to be set up with urol  Says he doesn't have prob at home when he can hear running water.  3.  Constipation  Resolved  4.  HTN  I have backed off on Lisinoprl  HCTZ to 10/12.5 since metoprolol added.  LOS: 1 day   Dietrich Pates 07/23/2013, 9:06 AM

## 2013-07-23 NOTE — Evaluation (Signed)
Physical Therapy Evaluation Patient Details Name: Mark Joseph. MRN: [id number] DOB: 1921-05-22 Today's Date: 07/23/2013 Time: 4098-1191 PT Time Calculation (min): 32 min  PT Assessment / Plan / Recommendation History of Present Illness  HPI: Mark Deeg. is a 80 y.o. male w/ no prior cardiac history, PMHx s/f CVA in 2013, HTN, HLD, GERD, recent compression fracture who presents to [facility] ED for constipation, abdominal pain- incidentally found to have new onset atrial fibrillation w/ RVR.   Clinical Impression   Pt admitted with above. Pt currently with functional limitations due to the deficits listed below (see PT Problem List).  Pt will benefit from skilled PT to increase their independence and safety with mobility to allow discharge to the venue listed below.   Mark Joseph was quite independent and active prior to recent fall resulting in L2 fracture; Feel he would benefit from CIR stay postacutely to maximize independence and safety with mobility and ADLs prior to dc home.   Pt with specific report of feeling "sore" and "weak" bil anterior thighs at quadriceps; Worth considering further investigation (possible re-imaging, Neuro/Neurosurg Consult) in setting of L2 compression fx  It is also worth considering bracing for help with pain      PT Assessment  Patient needs continued PT services    Follow Up Recommendations  CIR    Does the patient have the potential to tolerate intense rehabilitation      Barriers to Discharge        Equipment Recommendations  Rolling walker with 5" wheels;3in1 (PT) (pt may already have)    Recommendations for Other Services Rehab consult;OT consult   Frequency Min 3X/week    Precautions / Restrictions Precautions Precautions: Fall;Back (Back prec for comfort with L2 fracture) Precaution Booklet Issued: No (Needs more education in back prec with further visits) Precaution Comments: Becomes tachy with activity   Pertinent Vitals/Pain HR  incr with acitivity to 143 observed max;  decr to 82 post a few minutes in bed; RN aware      Mobility  Bed Mobility Overal bed mobility: Needs Assistance Bed Mobility: Supine to Sit;Sit to Supine Supine to sit: Min assist Sit to supine: Mod assist General bed mobility comments: Cues to initiate and for back prec for comfort, though pt did not follow back prec; Assist in particular for helping LEs back into bed Transfers Overall transfer level: Needs assistance Equipment used: Rolling walker (2 wheeled) Transfers: Sit to/from Stand Sit to Stand: Mod assist General transfer comment: Mod physical assist for power up from bed and low commode; Cues for safety, and hand placement Ambulation/Gait Ambulation/Gait assistance: Min assist Ambulation Distance (Feet): 20 Feet (to/frim bathroom) Assistive device: Rolling walker (2 wheeled) Gait Pattern/deviations: Decreased step length - right;Decreased step length - left;Wide base of support;Trunk flexed Gait velocity: quite slow Gait velocity interpretation: Below normal speed for age/gender General Gait Details: Slow steps, quite dependent on UE support from RW; Reports feeling like anterior thighs are "weak" and "sore" ; RN notified    Exercises     PT Diagnosis: Difficulty walking;Acute pain;Generalized weakness  PT Problem List: Decreased strength;Decreased range of motion;Decreased activity tolerance;Decreased balance;Decreased mobility;Decreased knowledge of use of DME;Pain;Decreased knowledge of precautions PT Treatment Interventions: DME instruction;Gait training;Stair training;Functional mobility training;Therapeutic activities;Therapeutic exercise;Balance training;Patient/family education     PT Goals(Current goals can be found in the care plan section) Acute Rehab PT Goals Patient Stated Goal: Wants to be able to get home PT Goal Formulation: With patient Time For  Goal Achievement: 07/30/13 Potential to Achieve Goals:  Good  Visit Information  Last PT Received On: 07/23/13 Assistance Needed: +1 History of Present Illness: HPI: Mark Joseph A Twitty Jr. is a 80 y.o. male w/ no prior cardiac history, PMHx s/f CVA in 2013, HTN, HLD, GERD, recent compression fracture who [facility] ED for constipation, abdominal pain- incidentally found to have new onset atrial fibrillation w/ RVR.        Prior Functioning  Home Living Family/patient expects to be discharged to:: Private residence Living Arrangements: Spouse/significant other Available Help at Discharge: Family;Available 24 hours/day Type of Home: House Home Access: Stairs to enter Entergy CorporationEntrance Stairs-Number of Steps: 2 Entrance Stairs-Rails: None Home Layout: Two level;Able to live on main level with bedroom/bathroom Home Equipment: Bedside commode;Cane - quad;Cane - single point Prior Function Level of Independence: Independent Comments: Prior to fall apprx 1 week ago, pt was independent, driving, [facility] Communication Communication: No difficulties;HOH (seems HOH)    Cognition  Cognition Arousal/Alertness: Awake/alert Behavior During Therapy: WFL for tasks assessed/performed Overall Cognitive Status: Within Functional Limits for tasks assessed    Extremity/Trunk Assessment Upper Extremity Assessment Upper Extremity Assessment: Overall WFL for tasks assessed;Defer to OT evaluation Lower Extremity Assessment Lower Extremity Assessment: Generalized weakness (Seems limited by pain) Cervical / Trunk Assessment Cervical / Trunk Assessment: Kyphotic   Balance Balance Overall balance assessment: Needs assistance Sitting-balance support: Single extremity supported Sitting balance-Leahy Scale: Fair Standing balance support: Bilateral upper extremity supported Standing balance-Leahy Scale: Poor Standing balance comment: Relies on bil UE support from RW  End of Session PT - End of Session Activity Tolerance: Patient limited by  fatigue;Patient limited by pain;Other (comment) (and incr HR with activity) Patient left: in bed;with call bell/phone within reach;with family/visitor present (preparing to eat supper) Nurse Communication: Mobility status  GP     Van ClinesGarrigan, Kaimana Lurz Hamff 07/23/2013, 5:45 PM  Van ClinesHolly Alyssia Heese, PT  Acute Rehabilitation Services Pager [phone] Office [phone]

## 2013-07-23 NOTE — Progress Notes (Signed)
Patint in and out of apparent SVT  Rates 140 Does not appear to be afib  Will review strips with EP  Keep on current regimen for now.

## 2013-07-23 NOTE — Progress Notes (Signed)
pts HR maintaining 140s-130s ST, confirmed by EKG; pt states he feels tired, BP 129/59; Hurman Hornony Arguello, PA paged and made aware; order received, 5mg  IV lopressor given. Will continue to monitor

## 2013-07-23 NOTE — Progress Notes (Signed)
  Echocardiogram 2D Echocardiogram has been performed.  Mark Joseph 07/23/2013, 8:40 AM

## 2013-07-23 NOTE — Progress Notes (Signed)
Pt c/o feeling like bladder is full and only voiding small amounts at a time. Pt bladder scanned for >999. Dr End called and made aware with orders placed. 14 french Foley inserted without difficulty. Clear yellow urine returned and pt felt better within minutes of placement. Will cont to monitor.

## 2013-07-23 NOTE — Discharge Instructions (Signed)
Information on my medicine - ELIQUIS® (apixaban) ° °This medication education was reviewed with me or my healthcare representative as part of my discharge preparation.  The pharmacist that spoke with me during my hospital stay was:  Paz Winsett, Melville Robert, RPH ° °Why was Eliquis® prescribed for you? °Eliquis® was prescribed for you to reduce the risk of a blood clot forming that can cause a stroke if you have a medical condition called atrial fibrillation (a type of irregular heartbeat). ° °What do You need to know about Eliquis® ? °Take your Eliquis® TWICE DAILY - one tablet in the morning and one tablet in the evening with or without food. If you have difficulty swallowing the tablet whole please discuss with your pharmacist how to take the medication safely. ° °Take Eliquis® exactly as prescribed by your doctor and DO NOT stop taking Eliquis® without talking to the doctor who prescribed the medication.  Stopping may increase your risk of developing a stroke.  Refill your prescription before you run out. ° °After discharge, you should have regular check-up appointments with your healthcare provider that is prescribing your Eliquis®.  In the future your dose may need to be changed if your kidney function or weight changes by a significant amount or as you get older. ° °What do you do if you miss a dose? °If you miss a dose, take it as soon as you remember on the same day and resume taking twice daily.  Do not take more than one dose of ELIQUIS at the same time to make up a missed dose. ° °Important Safety Information °A possible side effect of Eliquis® is bleeding. You should call your healthcare provider right away if you experience any of the following: °  Bleeding from an injury or your nose that does not stop. °  Unusual colored urine (red or dark brown) or unusual colored stools (red or black). °  Unusual bruising for unknown reasons. °  A serious fall or if you hit your head (even if there is no  bleeding). ° °Some medicines may interact with Eliquis® and might increase your risk of bleeding or clotting while on Eliquis®. To help avoid this, consult your healthcare provider or pharmacist prior to using any new prescription or non-prescription medications, including herbals, vitamins, non-steroidal anti-inflammatory drugs (NSAIDs) and supplements. ° °This website has more information on Eliquis® (apixaban): www.Eliquis.com. ° °

## 2013-07-24 DIAGNOSIS — R5381 Other malaise: Secondary | ICD-10-CM

## 2013-07-24 DIAGNOSIS — I4891 Unspecified atrial fibrillation: Secondary | ICD-10-CM

## 2013-07-24 DIAGNOSIS — S32009A Unspecified fracture of unspecified lumbar vertebra, initial encounter for closed fracture: Secondary | ICD-10-CM

## 2013-07-24 DIAGNOSIS — W19XXXA Unspecified fall, initial encounter: Secondary | ICD-10-CM

## 2013-07-24 MED ORDER — AMIODARONE HCL 200 MG PO TABS
200.0000 mg | ORAL_TABLET | Freq: Two times a day (BID) | ORAL | Status: DC
  Administered 2013-07-24 – 2013-07-27 (×7): 200 mg via ORAL
  Filled 2013-07-24 (×8): qty 1

## 2013-07-24 NOTE — Progress Notes (Signed)
Rehab Admissions Coordinator Note:  Patient was screened by Trish MageLogue, Everlee Quakenbush M for appropriateness for an Inpatient Acute Rehab Consult.  At this time, we are recommending Inpatient Rehab consult.  Trish MageLogue, Kaesha Kirsch M 07/24/2013, 9:13 AM  I can be reached at [phone].

## 2013-07-24 NOTE — Progress Notes (Signed)
pts HR sustaining 140s, pt sitting up on side of his bed without complaints; BP stable;  5mg  IV lopressor given, will continue to monitor

## 2013-07-24 NOTE — Consult Note (Signed)
ELECTROPHYSIOLOGY CONSULT NOTE   Patient ID: Mark CanalesLeon A Wank Jr. MRN: [id number], DOB/AGE: 78/07/1921   Admit date: 07/22/2013 Date of Consult: 07/24/2013  Primary Physician: Kirby FunkJohn Griffin, MD Primary Cardiologist: [facility] Reason for Consultation: SVT  History of Present Illness Mark CanalesLeon A Kicklighter Jr. is a 80 y.o. male with prior CVA in 2013, unknown etiology, HTN, GERD and recent compression fracture who presented with severe constipation and abdominal pain after mechanical fall with subsequent L2 fracture.   He is a very functional 80 year old at baseline. [facility] to exercise on the seated bike. He was carrying a box onto his porch last week when he missed a step and unfortunately fell backwards striking his tailbone. Follow-up imaging revealed a small L2 fracture. He was started on oxycodone for analgesia. For the past 4 days, he has been constipated and developed abdominal distention and pain for which he presented to the ED. On presentation he was tachycardic and an ECG done. This revealed a wide complex, fairly regular appearing tachycardia at 161 bpm. Prior ECGs reveal NSR w/ conduction defects in RBBB and LAFB. He was started on IV Cardizem. He spontaneously converted to SR. He denies chest pain, SOB, palpitations, lightheadedness, fatigue, syncope, PND, orthopnea or LE swelling.   Past Medical History Past Medical History  Diagnosis Date  . Hypertension   . GERD (gastroesophageal reflux disease)   . Osteoporosis   . High cholesterol   . Stroke 01/2012    "small; affected his speech; pretty much back to normal now" (09/15/2012)  . Pneumonitis 1971  . Arthritis     "knees; right is worse" (09/15/2012)    Past Surgical History Past Surgical History  Procedure Laterality Date  . Appendectomy  1953  . Inguinal hernia repair Bilateral 09/15/2012    w/mesh Hattie Perch/notes 09/15/2012  . Inguinal hernia repair Bilateral 09/15/2012    Procedure: HERNIA REPAIR INGUINAL ADULT  BILATERAL;  Surgeon: Emelia LoronMatthew Wakefield, MD;  [facility];  Service: General;  Laterality: Bilateral;  . Insertion of mesh Bilateral 09/15/2012    Procedure: INSERTION OF MESH;  Surgeon: Emelia LoronMatthew Wakefield, MD;  [facility];  Service: General;  Laterality: Bilateral;    Allergies/Intolerances No Known Allergies  Inpatient Medications . apixaban  5 mg Oral BID  . finasteride  5 mg Oral Daily  . lisinopril  10 mg Oral Daily   And  . hydrochlorothiazide  12.5 mg Oral Daily  . lidocaine  1 patch Transdermal Q24H  . metoprolol tartrate  50 mg Oral BID  . pantoprazole  40 mg Oral Daily  . polyethylene glycol  17 g Oral Daily  . selenium  200 mcg Oral Daily  . simvastatin  10 mg Oral QHS  . tamsulosin  0.4 mg Oral Daily    Family History Positive for CAD   Social History History   Social History  . Marital Status: Married    Spouse Name: N/A    Number of Children: N/A  . Years of Education: N/A   Occupational History  . Not on file.   Social History Main Topics  . Smoking status: Former Smoker -- 0.50 packs/day for 25 years    Types: Cigarettes    Quit date: 05/11/1968  . Smokeless tobacco: Never Used  . Alcohol Use: No  . Drug Use: No  . Sexual Activity: Not on file   Other Topics Concern  . Not on file   Social History Narrative  .  No narrative on file     Review of Systems General: No chills, fever, night sweats or weight changes  Cardiovascular:  No chest pain, dyspnea on exertion, edema, orthopnea, palpitations, paroxysmal nocturnal dyspnea Dermatological: No rash, lesions or masses Respiratory: No cough, dyspnea Urologic: No hematuria, dysuria Abdominal: No nausea, vomiting, diarrhea, bright red blood per rectum, melena, or hematemesis Neurologic: No visual changes, weakness, changes in mental status All other systems reviewed and are otherwise negative except as noted above.  Physical Exam Vitals: Blood pressure 150/58, pulse 143, temperature 97.9  F (36.6 C), temperature source Oral, resp. rate 20, height 5\' 11"  (1.803 m), weight 151 lb 3.8 oz (68.6 kg), SpO2 98.00%.  General: Well developed, well appearing 80 y.o. male in no acute distress. HEENT: Normocephalic, atraumatic. EOMs intact. Sclera nonicteric. Oropharynx clear.  Neck: Supple. No JVD. Lungs: Respirations regular and unlabored, CTA bilaterally. No wheezes, rales or rhonchi. Heart: RRR. S1, S2 present. No murmurs, rub, S3 or S4. Abdomen: Soft, non-distended.  Extremities: No clubbing, cyanosis or edema. DP/PT/Radials 2+ and equal bilaterally. Psych: Normal affect. Neuro: Alert and oriented X 3. Moves all extremities spontaneously. Musculoskeletal: No kyphosis. Skin: Intact. Warm and dry. No rashes or petechiae in exposed areas.   Labs  Recent Labs  07/22/13 0923 07/22/13 1821 07/22/13 2224  TROPONINI <0.30 <0.30 <0.30   Lab Results  Component Value Date   WBC 7.3 07/22/2013   HGB 12.1* 07/22/2013   HCT 33.9* 07/22/2013   MCV 91.6 07/22/2013   PLT 260 07/22/2013    Recent Labs Lab 07/22/13 0923  NA 135*  K 4.6  CL 94*  CO2 28  BUN 19  CREATININE 0.86  CALCIUM 9.5  PROT 6.5  BILITOT 0.5  ALKPHOS 44  ALT 21  AST 28  GLUCOSE 123*    Recent Labs  07/22/13 1821  TSH 1.840    Radiology/Studies Dg Abd Acute W/chest 07/22/2013    FINDINGS: There is no evidence of dilated bowel loops or free intraperitoneal air. Gallstones noted. Thoracic aortic tortuosity. No cardiomegaly. Mild basilar atelectasis or scarring. No edema or consolidation. No effusion or pneumothorax. L2 compression deformity, known from radiography 07/14/2013.   IMPRESSION: 1. Negative for bowel obstruction or perforation. 2. Cholelithiasis.    Electronically Signed   By: Tiburcio Pea M.D.   On: 07/22/2013 10:53   Echocardiogram  Study Conclusions - Left ventricle: Difficult acoustic windows make evaluation difficult. LVEF appears mild to moderately depressed with hypokinesis inf  the distal inferior, distal septal, mid/distal lateral and apical walls. Doppler parameters are consistent with abnormal left ventricular relaxation (grade 1 diastolic dysfunction). - Right ventricle: The cavity size was mildly dilated. Systolic function was mildly to moderately reduced. - Tricuspid valve: Moderate regurgitation. - Pulmonary arteries: PA peak pressure: 39mm Hg (S). Impressions: - Acoustic windows limit comparison with echo from 2013 but LV function appears worse, RV is larger with depressed function and TR is more severe.  12-lead ECG 07/23/2013 at 12:43 - regular, wide complex tachycardia at 137 bpm, most likely an atrial tachycardia with underlying RBBB Telemetry reviewed - SR with frequent PACs and frequent runs of SVT ~140 bpm, most consistent with an atrial tachycardia  Assessment and Plan 1. Paroxysmal atrial tachycardia, also questionable atypical atrial flutter as well as possible AVNRT 2. Mild to moderate LV dysfunction (with RWMAs) and mild to moderate RV dysfunction 3. Recent mechanical fall with L2 fracture 4. Prior CVA 2013 5. HTN Mr. Fjeld presented to the ED with severe  constipation after starting pain medication for L2 fracture. In the ED was tachycardic and initial ECG is most consistent with an atrial tachycardia with underlying RBBB. On telemetry and on one subsequent ECG he has what appears to be most consistent with an atrial tachycardia. He has had frequent PACs and runs of atrial tachycardia. He is asymptomatic; however in setting of LV dysfunction would recommend aggressive rate / rhythm control. I discussed treatment options including medication versus EPS +RF ablation. Also amiodarone initiation was given as an option and has some advantage, particularly with multiple possible rhythm mechanisms.   Signed, Rick Duff, PA-C 07/24/2013, 1:08 PM   EP Attending  Patient seen and examined. Note findings above. I have made revisions. The patient  has multiple potential arrhythmia mechanisms. I discussed the treatment options with the patient and his wife. Catheter ablation as well as amiodarone were both considered. Because he is so asymptomatic with regard to palpitations, syncope, shortness of breath, or chest pressure, I have recommended initiation of amiodarone therapy. There is potential for his tachycardia to be contributing to his underlying cardiomyopathy. I would anticipate having the patient wear a cardiac monitor approximately 4 weeks after discharge for 48 hours. Additional recommendations will be based on how he does and how well his arrhythmias are controlled. His advanced age, makes medical therapy a reasonable first approach. I did warn the patient and his wife, that amiodarone may result in bradycardia and if symptomatic, pacemaker therapy would be appropriate.  Lewayne Bunting, M.D.

## 2013-07-24 NOTE — Progress Notes (Signed)
Physical Therapy Treatment Patient Details Name: Brett CanalesLeon A Cardenas Jr. MRN: [id number] DOB: 11/14/1921 Today's Date: 07/24/2013 Time: [id number] PT Time Calculation (min): 32 min  PT Assessment / Plan / Recommendation  History of Present Illness HPI: Brett CanalesLeon A Huestis Jr. is a 80 y.o. male w/ no prior cardiac history, PMHx s/f CVA in 2013, HTN, HLD, GERD, recent compression fracture who [facility] ED for constipation, abdominal pain- incidentally found to have new onset atrial fibrillation w/ RVR.    PT Comments   Pt's HR noted to increased into 140's  When sitting  On bed and in recliner. RN aware. Pt will benefit from post acute rehab setting when medically stable.  Follow Up Recommendations  CIR     Does the patient have the potential to tolerate intense rehabilitation     Barriers to Discharge        Equipment Recommendations  Rolling walker with 5" wheels;3in1 (PT)    Recommendations for Other Services Rehab consult;OT consult  Frequency Min 3X/week   Progress towards PT Goals Progress towards PT goals: Progressing toward goals  Plan Current plan remains appropriate    Precautions / Restrictions Precautions Precautions: Fall;Back Precaution Comments: Becomes tachy with activity   Pertinent Vitals/Pain 114-149 HR    Mobility  Transfers Overall transfer level: Needs assistance Equipment used: Rolling walker (2 wheeled) Transfers: Sit to/from [employer]Stand;Stand Pivot Transfers Sit to Stand: Mod assist;From elevated surface Stand pivot transfers: Min assist General transfer comment: Mod physical assist for power up from low recliner, easier from elevated bed. Cues for safety, and hand placement. use of RW for pivot to recliner from bed. Ambulation/Gait Ambulation/Gait assistance: Min assist Ambulation Distance (Feet): 20 Feet Assistive device: Rolling walker (2 wheeled) Gait Pattern/deviations: Decreased step length - right;Decreased step length - left;Trunk  flexed;Festinating Gait velocity: quite slow General Gait Details: initially tendency to festinate when walking.Improved with distance.    Exercises     PT Diagnosis:    PT Problem List:   PT Treatment Interventions:     PT Goals (current goals can now be found in the care plan section)    Visit Information  Last PT Received On: 07/24/13 Assistance Needed: +1 History of Present Illness: HPI: Brett CanalesLeon A Difiore Jr. is a 80 y.o. male w/ no prior cardiac history, PMHx s/f CVA in 2013, HTN, HLD, GERD, recent compression fracture who [facility] ED for constipation, abdominal pain- incidentally found to have new onset atrial fibrillation w/ RVR.     Subjective Data      Cognition  Cognition Arousal/Alertness: Awake/alert Behavior During Therapy: WFL for tasks assessed/performed Overall Cognitive Status: Within Functional Limits for tasks assessed    Balance  Balance Standing balance support: Bilateral upper extremity supported Standing balance-Leahy Scale: Poor Standing balance comment: relies on RW for support.  End of Session PT - End of Session Equipment Utilized During Treatment: Gait belt Activity Tolerance: Treatment limited secondary to medical complications (Comment) (RN recommended min. activity due to incr. in HR spontaneously.) Patient left: in chair;with call bell/phone within reach;with chair alarm set;with family/visitor present;with nursing/sitter in room Nurse Communication: Mobility status   GP     Rada HayHill, Karam Dunson Elizabeth 07/24/2013, 8:42 AM Blanchard KelchKaren Marcea Rojek PT [phone]

## 2013-07-24 NOTE — Consult Note (Signed)
Physical Medicine and Rehabilitation Consult Reason for Consult: Physical deconditioning Referring Physician: Dr. Mayford Knife   HPI: Mark Joseph. is a 80 y.o. 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 Patient presented to [facility] 07/22/2013 with constipation, abdominal pain an incidental finding of new onset atrial fibrillation with RVR. Cardiology services followup placed on Cardizem drip. Echocardiogram with grade 1 diastolic dysfunction. Systolic function mildly to moderately reduced. Placed on ELIQUIS for age of fibrillation as well as history of CVA. Ep to followup concerning issues of arrhythmia and recommendations. Bouts of urinary retention requiring Foley catheter and placed on Flomax as well as Proscar. Physical occupational therapy evaluations completed 07/23/2013 with recommendations of physical medicine rehabilitation consult.   Review of Systems  Cardiovascular: Positive for palpitations.  Gastrointestinal: Positive for abdominal pain and constipation.       GERD  Genitourinary: Positive for urgency.  Musculoskeletal: Positive for back pain and falls.  All other systems reviewed and are negative.   Past Medical History  Diagnosis Date  . Hypertension   . GERD (gastroesophageal reflux disease)   . Osteoporosis   . High cholesterol   . Stroke 01/2012    "small; affected his speech; pretty much back to normal now" (09/15/2012)  . Pneumonitis 1971  . Arthritis     "knees; right is worse" (09/15/2012)   Past Surgical History  Procedure Laterality Date  . Appendectomy  1953  . Inguinal hernia repair Bilateral 09/15/2012    w/mesh Hattie Perch 09/15/2012  . Inguinal hernia repair Bilateral 09/15/2012    Procedure: HERNIA REPAIR INGUINAL ADULT BILATERAL;  Surgeon: Emelia Loron, MD;  Location: [facility] OR;  Service: General;  Laterality:  Bilateral;  . Insertion of mesh Bilateral 09/15/2012    Procedure: INSERTION OF MESH;  Surgeon: Emelia Loron, MD;  Location: [facility] OR;  Service: General;  Laterality: Bilateral;   History reviewed. No pertinent family history. Social History:  reports that he quit smoking about 45 years ago. His smoking use included Cigarettes. He has a 12.5 pack-year smoking history. He has never used smokeless tobacco. He reports that he does not drink alcohol or use illicit drugs. Allergies: No Known Allergies Medications Prior to Admission  Medication Sig Dispense Refill  . alendronate (FOSAMAX) 70 MG tablet Take 70 mg by mouth every 7 (seven) days. Take on Sundays. Take with a full glass of water on an empty stomach.      Marland Kitchen CALCIUM PO Take 1 tablet by mouth 2 (two) times daily.      . clopidogrel (PLAVIX) 75 MG tablet Take 1 tablet (75 mg total) by mouth daily with breakfast.  30 tablet  11  . finasteride (PROSCAR) 5 MG tablet Take 5 mg by mouth daily.      Marland Kitchen lidocaine (LIDODERM) 5 % Place 1 patch onto the skin daily. Remove & Discard patch within 12 hours or as directed by MD      . lisinopril-hydrochlorothiazide (PRINZIDE,ZESTORETIC) 20-25 MG per tablet Take 1 tablet by mouth daily.      . pantoprazole (PROTONIX) 40 MG tablet Take 40 mg by mouth daily.      . polyethylene glycol (MIRALAX / GLYCOLAX) packet Take 17 g by mouth daily.      . pravastatin (PRAVACHOL) 20 MG tablet Take 20 mg by mouth every evening.      . selenium 50 MCG TABS  tablet Take 200 mcg by mouth daily.      . tamsulosin (FLOMAX) 0.4 MG CAPS Take 1 capsule (0.4 mg total) by mouth daily.  30 capsule  1  . traMADol (ULTRAM) 50 MG tablet Take 50 mg by mouth every 6 (six) hours as needed.        Home: Home Living Family/patient expects to be discharged to:: Private residence Living Arrangements: Spouse/significant other Available Help at Discharge: Family;Available 24 hours/day Type of Home: House Home Access: Stairs to  enter Entergy CorporationEntrance Stairs-Number of Steps: 2 Entrance Stairs-Rails: None Home Layout: Two level;Able to live on main level with bedroom/bathroom Home Equipment: Bedside commode;Cane - quad;Cane - single point  Functional History: Prior Function Comments: Prior to fall apprx 1 week ago, pt was independent, driving, [facility] Functional Status:  Mobility:     Ambulation/Gait Ambulation Distance (Feet): 20 Feet Gait velocity: quite slow General Gait Details: initially tendency to festinate when walking.Improved with distance.    ADL:    Cognition: Cognition Overall Cognitive Status: Within Functional Limits for tasks assessed Cognition Arousal/Alertness: Awake/alert Behavior During Therapy: WFL for tasks assessed/performed Overall Cognitive Status: Within Functional Limits for tasks assessed  Blood pressure 150/58, pulse 143, temperature 97.9 F (36.6 C), temperature source Oral, resp. rate 20, height 5\' 11"  (1.803 m), weight 68.6 kg (151 lb 3.8 oz), SpO2 98.00%. Physical Exam  Constitutional: He is oriented to person, place, and time.  Frail appearing  HENT:  Head: Normocephalic.  Eyes: EOM are normal.  Neck: Normal range of motion. Neck supple. No thyromegaly present.  Cardiovascular:  Cardiac rate is controlled  Respiratory: Effort normal and breath sounds normal. No respiratory distress.  GI: Soft. Bowel sounds are normal. He exhibits no distension.  Genitourinary:  Foley tube in place  Neurological: He is alert and oriented to person, place, and time.  Patient pleasant and cooperative with exam. He followed commands. A little impulsive. Strength 4/5. Sensory exam grossly intact. Fair insight and awareness.   Skin: Skin is warm and dry.    No results found for this or any previous visit (from the past 24 hour(s)). No results found.  Assessment/Plan: Diagnosis: deconditioning after multiple medical. Recent L2 fx 1. Does the need for close, 24 hr/day  medical supervision in concert with the patient's rehab needs make it unreasonable for this patient to be served in a less intensive setting? Yes 2. Co-Morbidities requiring supervision/potential complications: htn, afib with RVR 3. Due to bladder management, bowel management, safety, skin/wound care, disease management, medication administration, pain management and patient education, does the patient require 24 hr/day rehab nursing? Yes 4. Does the patient require coordinated care of a physician, rehab nurse, PT (1-2 hrs/day, 5 days/week) and OT (1-2 hrs/day, 5 days/week) to address physical and functional deficits in the context of the above medical diagnosis(es)? Yes Addressing deficits in the following areas: balance, endurance, locomotion, strength, transferring, bowel/bladder control, bathing, dressing, feeding, grooming and psychosocial support 5. Can the patient actively participate in an intensive therapy program of at least 3 hrs of therapy per day at least 5 days per week? Yes 6. The potential for patient to make measurable gains while on inpatient rehab is good 7. Anticipated functional outcomes upon discharge from inpatient rehab are mod I to supervision with PT, mod I to supervision  with OT, n/a with SLP. 8. Estimated rehab length of stay to reach the above functional goals is: 7-10 days 9. Does the patient have adequate social supports  to accommodate these discharge functional goals? Yes 10. Anticipated D/C setting: Home 11. Anticipated post D/C treatments: HH therapy 12. Overall Rehab/Functional Prognosis: excellent  RECOMMENDATIONS: This patient's condition is appropriate for continued rehabilitative care in the following setting: CIR Patient has agreed to participate in recommended program. Potentially Note that insurance prior authorization may be required for reimbursement for recommended care.  Comment: Will follow along for medical progress. Pt is anxious to get home, but  his wife understands that she couldn't manage him right now.    Ranelle Oyster, MD, [facility] Physical Medicine & Rehabilitation     07/24/2013

## 2013-07-24 NOTE — Progress Notes (Signed)
Pt has only voided very small amounts since foley d/ced per days shift RN. Pt abd soft. Encouraged pt to stand and attempt to void without success. Offered to turn on water for sound effects but pt refused. Bladder scanned pt for 611cc of urine. Dr. Donnie Ahoilley called and made aware. Orders received to replace foley.  14 French Foley inserted without difficulty with clear yellow urine return. Pt tolerated well. Will cont to monitor.

## 2013-07-24 NOTE — Care Management Note (Unsigned)
    Page 1 of 1   07/25/2013     12:39:04 PM   CARE MANAGEMENT NOTE 07/25/2013  Patient:  Mark Joseph,Mark Joseph   Account Number:  [id number]  Date Initiated:  07/24/2013  Documentation initiated by:  GRAVES-BIGELOW,Dayvion Sans  Subjective/Objective Assessment:   Pt admitted for afib- Initiated on cardizem gtt.     Action/Plan:   CM will continue to monitor for disposition needs.   Anticipated DC Date:  07/26/2013   Anticipated DC Plan:  HOME W HOME HEALTH SERVICES      DC Planning Services  CM consult      Choice offered to / List presented to:             Status of service:  In process, will continue to follow Medicare Important Message given?   (If response is "NO", the following Medicare IM given date fields will be blank) Date Medicare IM given:   Date Additional Medicare IM given:    Discharge Disposition:    Per UR Regulation:  Reviewed for med. necessity/level of care/duration of stay  If discussed at Long Length of Stay Meetings, dates discussed:    Comments:  07-25-13 1237 Tomi BambergerBrenda Graves-Bigelow, RN,BSN [phone] CM did call [facility] on [address] and medication eliquis is available. CIR is still looking at pt. Will f/u with disposition needs.

## 2013-07-24 NOTE — Progress Notes (Addendum)
Patient Name: Mark CanalesLeon A Vanderberg Jr. Date of Encounter: 07/24/2013     Active Problems:   Atrial fibrillation with RVR    SUBJECTIVE  The patient continues to have asymptomatic runs of regular supraventricular tachycardia.  It is difficult to know whether this represents atrial flutter or SVT.  EP will come by today to look at his strips.  The patient himself is totally unaware of his rhythm.  He denies chest pain shortness of breath or dizziness associated with the fast heart rate.  His initial presentation was that he fell and fractured his L2 and after taking pain medication developed severe constipation.  He came to the emergency room because of the constipation and was found to be having rapid heart rate and was admitted. Last night the patient had difficulty voiding and Foley catheter had to be replaced.  He has a past history of requiring a Foley catheter following hernia surgery last year doesn't inability to void postoperatively.  His urologist is Dr. Unknown FoleyManney. The patient has been recommended for inpatient CIR.  We will plan to make that referral once his cardiac status is stabilized.  CURRENT MEDS . apixaban  5 mg Oral BID  . finasteride  5 mg Oral Daily  . lisinopril  10 mg Oral Daily   And  . hydrochlorothiazide  12.5 mg Oral Daily  . lidocaine  1 patch Transdermal Q24H  . metoprolol tartrate  50 mg Oral BID  . pantoprazole  40 mg Oral Daily  . polyethylene glycol  17 g Oral Daily  . selenium  200 mcg Oral Daily  . simvastatin  10 mg Oral QHS  . tamsulosin  0.4 mg Oral Daily    OBJECTIVE  Filed Vitals:   07/23/13 1418 07/23/13 2115 07/24/13 0512 07/24/13 0911  BP: 104/71 120/74 120/63 150/58  Pulse: 101 77 66 110  Temp: 98.3 F (36.8 C) 98.4 F (36.9 C) 97.6 F (36.4 C) 97.9 F (36.6 C)  TempSrc: Oral Axillary Oral Oral  Resp: 16 16 18 20   Height:      Weight:      SpO2: 91% 97% 94% 98%    Intake/Output Summary (Last 24 hours) at 07/24/13 0938 Last data filed  at 07/24/13 0800  Gross per 24 hour  Intake    600 ml  Output    600 ml  Net      0 ml   Filed Weights   07/22/13 1620 07/23/13 0905  Weight: 168 lb (76.204 kg) 151 lb 3.8 oz (68.6 kg)    PHYSICAL EXAM  General: Pleasant, NAD. Neuro: Alert and oriented X 3. Moves all extremities spontaneously. Psych: Normal affect. HEENT:  Normal  Neck: Supple without bruits or JVD. Lungs:  Resp regular and unlabored, CTA. Heart: RRR no s3, s4, or murmurs.  Pulse rapid and regular intermittently on auscultation. Abdomen: Soft, non-tender, non-distended, BS + x 4.  Foley catheter in place. Extremities: No clubbing, cyanosis or edema. DP/PT/Radials 2+ and equal bilaterally.  Accessory Clinical Findings  CBC  Recent Labs  07/22/13 0923  WBC 7.3  NEUTROABS 5.3  HGB 12.1*  HCT 33.9*  MCV 91.6  PLT 260   Basic Metabolic Panel  Recent Labs  07/22/13 0923 07/22/13 1821  NA 135*  --   K 4.6  --   CL 94*  --   CO2 28  --   GLUCOSE 123*  --   BUN 19  --   CREATININE 0.86  --   CALCIUM  9.5  --   MG  --  1.8   Liver Function Tests  Recent Labs  07/22/13 0923  AST 28  ALT 21  ALKPHOS 44  BILITOT 0.5  PROT 6.5  ALBUMIN 3.5   No results found for this basename: LIPASE, AMYLASE,  in the last 72 hours Cardiac Enzymes  Recent Labs  07/22/13 0923 07/22/13 1821 07/22/13 2224  TROPONINI <0.30 <0.30 <0.30   BNP No components found with this basename: POCBNP,  D-Dimer No results found for this basename: DDIMER,  in the last 72 hours Hemoglobin A1C No results found for this basename: HGBA1C,  in the last 72 hours Fasting Lipid Panel No results found for this basename: CHOL, HDL, LDLCALC, TRIG, CHOLHDL, LDLDIRECT,  in the last 72 hours Thyroid Function Tests  Recent Labs  07/22/13 1821  TSH 1.840    TELE  Regular supraventricular tachycardia at 140-150/min. When not in SVT he is in NSR with frequent PACs,  ECG  SVT with bifascicular  block.  Radiology/Studies  Dg Abd Acute W/chest  07/22/2013   CLINICAL DATA:  Constipation  EXAM: ACUTE ABDOMEN SERIES (ABDOMEN 2 VIEW & CHEST 1 VIEW)  COMPARISON:  10/03/2012  FINDINGS: There is no evidence of dilated bowel loops or free intraperitoneal air. Gallstones noted. Thoracic aortic tortuosity. No cardiomegaly. Mild basilar atelectasis or scarring. No edema or consolidation. No effusion or pneumothorax. L2 compression deformity, known from radiography 07/14/2013.  IMPRESSION: 1. Negative for bowel obstruction or perforation. 2. Cholelithiasis.   Electronically Signed   By: Tiburcio Pea M.D.   On: 07/22/2013 10:53    ASSESSMENT AND PLAN  1. Paroxysmal SVT ? Atrial flutter.  On apixaban 2. L2 fracture. 3. Constipation secondary to pain medication, resolved 4. urinary retention requiring Foley catheter to be replaced again last night 5.  Hypertension  Plan: Await EP opinion as to his arrhythmia. Will ask urology to see him regarding his recurrent problems with BPH and inability to void. Anticipate transfer to CIR after cardiac issues are resolved.  Signed, Cassell Clement MD I spoke with urologist Dr. Margarita Grizzle. He recommended keeping the Foley in place him continuing him on Flomax he'll urology will see him as an outpatient after he gets discharged from the hospital.

## 2013-07-25 NOTE — Progress Notes (Addendum)
Ip Rehab  Met with the patient and his wife at the bedside.  Discussed his post acute rehab options. At this time, only PT is following this patient.  Will need OT consult for their recommendations. Pt. And his wife would prefer IP rehab so I will pursue insurance authorization through [facility] once I have the completed OT evaluation.  Please call if questions.  Thank  You!  [place] Admissions Coordinator Cell [phone] Office [phone]

## 2013-07-25 NOTE — Progress Notes (Signed)
Pt still having bursts of ST up to 150s-160s when he's up moving around; HR goes back down NSR (80s), when pt resting

## 2013-07-25 NOTE — Progress Notes (Signed)
Physical Therapy Treatment Patient Details Name: Mark CanalesLeon A Plunk Jr. MRN: [id number] DOB: 09/10/1921 Today's Date: 07/25/2013 Time: [id number] PT Time Calculation (min): 29 min  PT Assessment / Plan / Recommendation  History of Present Illness HPI: Mark CanalesLeon A Fakhouri Jr. is a 80 y.o. male w/ no prior cardiac history, PMHx s/f CVA in 2013, HTN, HLD, GERD, recent compression fracture who [facility] ED for constipation, abdominal pain- incidentally found to have new onset atrial fibrillation w/ RVR.    PT Comments   Overall improved mobility during session today.  Follow Up Recommendations  CIR     Does the patient have the potential to tolerate intense rehabilitation     Barriers to Discharge        Equipment Recommendations  Rolling walker with 5" wheels;3in1 (PT)    Recommendations for Other Services Rehab consult;OT consult  Frequency Min 3X/week   Progress towards PT Goals Progress towards PT goals: Progressing toward goals  Plan Current plan remains appropriate    Precautions / Restrictions Precautions Precautions: Fall;Back Precaution Comments: Becomes tachy with activity   Pertinent Vitals/Pain See flowsheet    Mobility  Bed Mobility Overal bed mobility: Needs Assistance Bed Mobility: Supine to Sit Supine to sit: Supervision General bed mobility comments: Cues to initiate and for back prec for comfort, though pt did not follow back prec Transfers Overall transfer level: Needs assistance Equipment used: Rolling walker (2 wheeled) Transfers: Sit to/from Stand Sit to Stand: Min guard General transfer comment: cues for safe technique and hand placement Ambulation/Gait Ambulation/Gait assistance: Min assist Ambulation Distance (Feet): 100 Feet Assistive device: Rolling walker (2 wheeled) Gait Pattern/deviations: WFL(Within Functional Limits) Gait velocity: improved; still decreased Gait velocity interpretation: Below normal speed for age/gender General Gait Details:  improved step length with decreased no significant deviations noted.  Cues to keep RW close    Exercises     PT Diagnosis:    PT Problem List:   PT Treatment Interventions:     PT Goals (current goals can now be found in the care plan section) Acute Rehab PT Goals Patient Stated Goal: Wants to be able to get home PT Goal Formulation: With patient Time For Goal Achievement: 07/30/13 Potential to Achieve Goals: Good  Visit Information  Last PT Received On: 07/25/13 Assistance Needed: +1 History of Present Illness: HPI: Mark CanalesLeon A Stegemann Jr. is a 80 y.o. male w/ no prior cardiac history, PMHx s/f CVA in 2013, HTN, HLD, GERD, recent compression fracture who [facility] for constipation, abdominal pain- incidentally found to have new onset atrial fibrillation w/ RVR.     Subjective Data  Subjective: "I feel so much better." Patient Stated Goal: Wants to be able to get home   Cognition  Cognition Arousal/Alertness: Awake/alert Behavior During Therapy: WFL for tasks assessed/performed Overall Cognitive Status: Within Functional Limits for tasks assessed    Balance  Balance Overall balance assessment: Needs assistance Sitting-balance support: No upper extremity supported;Feet supported Sitting balance-Leahy Scale: Fair Standing balance support: Bilateral upper extremity supported;During functional activity Standing balance-Leahy Scale: Poor  End of Session PT - End of Session Equipment Utilized During Treatment: Gait belt Activity Tolerance: Patient tolerated treatment well Patient left: in chair;with call bell/phone within reach;with family/visitor present Nurse Communication: Mobility status   GP     Moshe CiproMatthews, Alece Koppel K 07/25/2013, 2:02 PM  Clarita CraneStephanie F Amaar Oshita, PT, DPT [phone].

## 2013-07-25 NOTE — Progress Notes (Signed)
Patient ID: Mark CanalesLeon A Leckey Jr., male   DOB: 08/02/1921, 80 y.o.   MRN: [id number]   Patient Name: Mark CanalesLeon A Yilmaz Jr. Date of Encounter: 07/25/2013     Active Problems:   Atrial fibrillation with RVR    SUBJECTIVE No chest pain, sob, or palpitations  CURRENT MEDS . amiodarone  200 mg Oral BID  . apixaban  5 mg Oral BID  . finasteride  5 mg Oral Daily  . lisinopril  10 mg Oral Daily   And  . hydrochlorothiazide  12.5 mg Oral Daily  . lidocaine  1 patch Transdermal Q24H  . metoprolol tartrate  50 mg Oral BID  . pantoprazole  40 mg Oral Daily  . polyethylene glycol  17 g Oral Daily  . selenium  200 mcg Oral Daily  . simvastatin  10 mg Oral QHS  . tamsulosin  0.4 mg Oral Daily    OBJECTIVE  Filed Vitals:   07/24/13 1727 07/24/13 2100 07/25/13 0400 07/25/13 0959  BP: 135/82 120/68 138/76 133/93  Pulse: 142 78 59 84  Temp:  98.2 F (36.8 C) 98.1 F (36.7 C)   TempSrc:  Oral Oral   Resp:  18 18   Height:      Weight:   156 lb 15.5 oz (71.2 kg)   SpO2:  96% 94%     Intake/Output Summary (Last 24 hours) at 07/25/13 1040 Last data filed at 07/25/13 0832  Gross per 24 hour  Intake    720 ml  Output    900 ml  Net   -180 ml   Filed Weights   07/22/13 1620 07/23/13 0905 07/25/13 0400  Weight: 168 lb (76.204 kg) 151 lb 3.8 oz (68.6 kg) 156 lb 15.5 oz (71.2 kg)    PHYSICAL EXAM  General: Pleasant, elderly man, NAD. Neuro: Alert and oriented X 3. Moves all extremities spontaneously. HEENT:  Normal  Neck: Supple without bruit, 7 cm JVD. Lungs:  Resp regular and unlabored, CTA. Heart: RRR no s3, s4, or murmurs. Abdomen: Soft, non-tender, non-distended, BS + x 4.  Extremities: No clubbing, cyanosis or edema. DP/PT/Radials 2+ and equal bilaterally.  Accessory Clinical Findings  CBC No results found for this basename: WBC, NEUTROABS, HGB, HCT, MCV, PLT,  in the last 72 hours Basic Metabolic Panel  Recent Labs  07/22/13 1821  MG 1.8   Liver Function Tests No  results found for this basename: AST, ALT, ALKPHOS, BILITOT, PROT, ALBUMIN,  in the last 72 hours No results found for this basename: LIPASE, AMYLASE,  in the last 72 hours Cardiac Enzymes  Recent Labs  07/22/13 1821 07/22/13 2224  TROPONINI <0.30 <0.30   BNP No components found with this basename: POCBNP,  D-Dimer No results found for this basename: DDIMER,  in the last 72 hours Hemoglobin A1C No results found for this basename: HGBA1C,  in the last 72 hours Fasting Lipid Panel No results found for this basename: CHOL, HDL, LDLCALC, TRIG, CHOLHDL, LDLDIRECT,  in the last 72 hours Thyroid Function Tests  Recent Labs  07/22/13 1821  TSH 1.840    TELE nsr with periods of atrial tachy  Radiology/Studies  Dg Abd Acute W/chest  07/22/2013   CLINICAL DATA:  Constipation  EXAM: ACUTE ABDOMEN SERIES (ABDOMEN 2 VIEW & CHEST 1 VIEW)  COMPARISON:  10/03/2012  FINDINGS: There is no evidence of dilated bowel loops or free intraperitoneal air. Gallstones noted. Thoracic aortic tortuosity. No cardiomegaly. Mild basilar atelectasis or scarring. No edema or consolidation.  No effusion or pneumothorax. L2 compression deformity, known from radiography 07/14/2013.  IMPRESSION: 1. Negative for bowel obstruction or perforation. 2. Cholelithiasis.   Electronically Signed   By: Tiburcio Pea M.D.   On: 07/22/2013 10:53    ASSESSMENT AND PLAN 1. Incessant atrial tachycardia - improved on initiation of amio therapy. Continue amio. Would think he will be ready for discharge home. Tomorrow.   Gregg Taylor,M.D.  07/25/2013 10:40 AM

## 2013-07-25 NOTE — Clinical Social Work Psychosocial (Signed)
Clinical Social Work Department  BRIEF PSYCHOSOCIAL ASSESSMENT  Patient:Mark Joseph. Account Number: [id number]   Admit date: 07/22/13 Clinical Social Worker Rhea Pink, MSW Date/Time: 07/25/2013  11:00 AM Referred by: Physician Date Referred: 07/25/2013 Referred for   SNF Placement   Other Referral:  Interview type: Patient  And patient's wife Other interview type: PSYCHOSOCIAL DATA  Living Status: Spouse Admitted from facility:  Level of care:  Primary support name: Mark Joseph Primary support relationship to patient: Spouse Degree of support available:  Strong and vested  CURRENT CONCERNS  Current Concerns   Post-Acute Placement   Other Concerns:  SOCIAL WORK ASSESSMENT / PLAN  CSW met with pt re: PT recommendation is CIR. CSW explained that the patient would need a backup just in case CIR would not be able to take the patient.   Pt lives with his wife  CSW explained placement process and answered questions.   Pt reports [facility] as their backup place    CSW completed FL2 and initiated SNF search.     Assessment/plan status: Information/Referral to [employer]  Other assessment/ plan:  Information/referral to community resources:  SNF     PATIENT'S/FAMILY'S RESPONSE TO PLAN OF CARE:  Pt  And patient's wife reported feeling frustrated because the doctor seemed to indicate that the patient would be accepted by CIR. CSW explained to the patient and patient's wife that CIR could still accept the patient but it's always good to have SNF backup just in case CIR could not accept the patient. Both the patient and his wife understood and were agreeable to being faxed out as a back up to CIR.  Pt verbalized understanding of placement process and appreciation for CSW assist.   Rhea Pink, MSW, [place]

## 2013-07-25 NOTE — Clinical Social Work Psychosocial (Signed)
Clinical Social Work Department  CLINICAL SOCIAL WORK PLACEMENT NOTE   Patient: Mark CanalesLeon A Sahagun Jr. Account Number: [id number]  Admit date: 07/22/13 Clinical Social Worker: Sabino Niemannmy Pierrette Scheu LCSWA Date/time: 3/17/201511:30 AM  Clinical Social Work is seeking post-discharge placement for this patient at the following level of care: SKILLED NURSING (*CSW will update this form in Epic as items are completed)  07/25/2013 [facility]/family provided with [facility] Work's list of facilities offering this level of care within the geographic area requested by the patient (or if unable, by the patient's family).  07/25/2013  Patient/family informed of their freedom to choose among providers that offer the needed level of care, that participate in Medicare, Medicaid or managed care program needed by the patient, have an available bed and are willing to accept the patient.  07/25/2013  Patient/family informed of [facility]' ownership interest in [facility], as well as of the fact that they are under no obligation to receive care at this facility.  PASARR submitted to EDS on  PASARR number received from EDS on  FL2 transmitted to all facilities in geographic area requested by pt/family on 07/25/2013   FL2 transmitted to all facilities within larger geographic area on  Patient informed that his/her managed care company has contracts with or will negotiate with certain facilities, including the following:  Patient/family informed of bed offers received:  Patient chooses bed at  Physician recommends and patient chooses bed at  Patient to be transferred to on  Patient to be transferred to facility by  The following physician request were entered in Epic:  Additional Comments:

## 2013-07-26 DIAGNOSIS — I4891 Unspecified atrial fibrillation: Secondary | ICD-10-CM

## 2013-07-26 MED ORDER — AMIODARONE HCL 200 MG PO TABS: ORAL_TABLET | ORAL | Status: DC

## 2013-07-26 MED ORDER — APIXABAN 5 MG PO TABS: 5.0000 mg | ORAL_TABLET | Freq: Two times a day (BID) | ORAL | Status: DC

## 2013-07-26 MED ORDER — METOPROLOL TARTRATE 50 MG PO TABS: 50.0000 mg | ORAL_TABLET | Freq: Two times a day (BID) | ORAL | Status: DC

## 2013-07-26 NOTE — Progress Notes (Signed)
Inpatient Rehabilitation  I have a rehab bed available for patient today however I still await insurance authorization from [facility] [employer] [facility] [employer] [employer].  Have attempted to expedite this process, but currently still in the "holding pattern".  I have notified pt., SW and CM.  Please call if questions.  Thanks,  Weldon PickingSusan Ardis Fullwood PT Inpatient Rehab Admissions Coordinator Cell [phone] Office [phone]

## 2013-07-26 NOTE — Progress Notes (Signed)
Patient ID: Mark Canales., male   DOB: Jul 07, 1921, 80 y.o.   MRN: [id number]   Patient Name: Mark Joseph. Date of Encounter: 07/26/2013     Active Problems:   Atrial fibrillation with RVR    SUBJECTIVE Feels well. No chest pain  CURRENT MEDS . amiodarone  200 mg Oral BID  . apixaban  5 mg Oral BID  . finasteride  5 mg Oral Daily  . lisinopril  10 mg Oral Daily   And  . hydrochlorothiazide  12.5 mg Oral Daily  . lidocaine  1 patch Transdermal Q24H  . metoprolol tartrate  50 mg Oral BID  . pantoprazole  40 mg Oral Daily  . polyethylene glycol  17 g Oral Daily  . selenium  200 mcg Oral Daily  . simvastatin  10 mg Oral QHS  . tamsulosin  0.4 mg Oral Daily    OBJECTIVE  Filed Vitals:   07/25/13 0959 07/25/13 1406 07/25/13 2106 07/26/13 0539  BP: 133/93 119/68 127/79 123/74  Pulse: 84 53 70 54  Temp:  97.9 F (36.6 C) 98.4 F (36.9 C) 98.6 F (37 C)  TempSrc:  Oral Oral Oral  Resp:  18 18 20   Height:      Weight:      SpO2:  95% 95% 97%    Intake/Output Summary (Last 24 hours) at 07/26/13 0848 Last data filed at 07/26/13 0847  Gross per 24 hour  Intake    720 ml  Output   2376 ml  Net  -1656 ml   Filed Weights   07/22/13 1620 07/23/13 0905 07/25/13 0400  Weight: 168 lb (76.204 kg) 151 lb 3.8 oz (68.6 kg) 156 lb 15.5 oz (71.2 kg)    PHYSICAL EXAM  General: Pleasant, elderly man, NAD. Neuro: Alert and oriented X 3. Moves all extremities spontaneously. HEENT:  Normal  Neck: Supple without bruits or JVD. Lungs:  Resp regular and unlabored, CTA. Heart: RRR no s3, s4, or murmurs. Abdomen: Soft, non-tender, non-distended, BS + x 4.  Extremities: No clubbing, cyanosis or edema. DP/PT/Radials 2+ and equal bilaterally.  Accessory Clinical Findings  CBC No results found for this basename: WBC, NEUTROABS, HGB, HCT, MCV, PLT,  in the last 72 hours Basic Metabolic Panel No results found for this basename: NA, K, CL, CO2, GLUCOSE, BUN, CREATININE, CALCIUM,  MG, PHOS,  in the last 72 hours Liver Function Tests No results found for this basename: AST, ALT, ALKPHOS, BILITOT, PROT, ALBUMIN,  in the last 72 hours No results found for this basename: LIPASE, AMYLASE,  in the last 72 hours Cardiac Enzymes No results found for this basename: CKTOTAL, CKMB, CKMBINDEX, TROPONINI,  in the last 72 hours BNP No components found with this basename: POCBNP,  D-Dimer No results found for this basename: DDIMER,  in the last 72 hours Hemoglobin A1C No results found for this basename: HGBA1C,  in the last 72 hours Fasting Lipid Panel No results found for this basename: CHOL, HDL, LDLCALC, TRIG, CHOLHDL, LDLDIRECT,  in the last 72 hours Thyroid Function Tests No results found for this basename: TSH, T4TOTAL, FREET3, T3FREE, THYROIDAB,  in the last 72 hours  TELE  NSR with PAC's.   Radiology/Studies  Dg Abd Acute W/chest  07/22/2013   CLINICAL DATA:  Constipation  EXAM: ACUTE ABDOMEN SERIES (ABDOMEN 2 VIEW & CHEST 1 VIEW)  COMPARISON:  10/03/2012  FINDINGS: There is no evidence of dilated bowel loops or free intraperitoneal air. Gallstones noted. Thoracic aortic  tortuosity. No cardiomegaly. Mild basilar atelectasis or scarring. No edema or consolidation. No effusion or pneumothorax. L2 compression deformity, known from radiography 07/14/2013.  IMPRESSION: 1. Negative for bowel obstruction or perforation. 2. Cholelithiasis.   Electronically Signed   By: Tiburcio PeaJonathan  Watts M.D.   On: 07/22/2013 10:53    ASSESSMENT AND PLAN 1. Incessant atrial tachycardia - now resolved on amiodarone. Continue 400 mg a day for 4 weeks then decrease down to 200 mg daily. 2.Disposition: he is ready for discharge from a medical perspective. Note eval for CIR vs SNF. Hopefully he can be discharged today.  Joven Mom,M.D.  07/26/2013 8:48 AM

## 2013-07-26 NOTE — Evaluation (Signed)
Occupational Therapy Evaluation Patient Details Name: Mark Joseph. MRN: [id number] DOB: July 18, 1921 Today's Date: 07/26/2013 Time: 2725-3664 OT Time Calculation (min): 28 min  OT Assessment / Plan / Recommendation History of present illness HPI: Mark Joseph. is a 80 y.o. male w/ no prior cardiac history, PMHx s/f CVA in 2013, HTN, HLD, GERD, recent compression fracture who presents to [facility] ED for constipation, abdominal pain- incidentally found to have new onset atrial fibrillation w/ RVR.    Clinical Impression   Pt demos decline in function with ADLs and ADL mobility safety with decreased strength, balance and endurance and would benefit from acute OT services to address impairments to increase level of function and ssafety    OT Assessment  Patient needs continued OT Services    Follow Up Recommendations  CIR    Barriers to Discharge   wof econcerned that pt will have difficulty at home wiht increasing HR during activity, current level of care  Equipment Recommendations  Tub/shower seat    Recommendations for Other Services    Frequency  Min 2X/week    Precautions / Restrictions Precautions Precautions: Fall;Back Precaution Booklet Issued: No Precaution Comments: Becomes tachy with activity Restrictions Weight Bearing Restrictions: No   Pertinent Vitals/Pain No c/o pain    ADL  Grooming: Performed;Wash/dry hands;Wash/dry face;Min guard Where Assessed - Grooming: Supported standing Upper Body Bathing: Simulated;Supervision/safety;Set up;Min guard Where Assessed - Upper Body Bathing: Unsupported sitting Lower Body Bathing: Simulated;Moderate assistance Upper Body Dressing: Performed;Supervision/safety;Set up;Min guard Where Assessed - Upper Body Dressing: Unsupported sitting Lower Body Dressing: Performed;Moderate assistance Toilet Transfer: Performed;Min guard Statistician Method: Sit to Barista: Grab bars;Raised toilet seat with arms  (or 3-in-1 over toilet) Toileting - Clothing Manipulation and Hygiene: Performed;Minimal assistance;Min guard Where Assessed - Glass blower/designer Manipulation and Hygiene: Standing Tub/Shower Transfer: Simulated;Minimal assistance Equipment Used: Rolling walker;Other (comment) (3 in 1) Transfers/Ambulation Related to ADLs: cues for safe technique and hand placement    OT Diagnosis: Generalized weakness  OT Problem List: Decreased strength;Decreased knowledge of use of DME or AE;Decreased activity tolerance;Impaired balance (sitting and/or standing);Cardiopulmonary status limiting activity;Decreased knowledge of precautions OT Treatment Interventions: Self-care/ADL training;Therapeutic exercise;Patient/family education;Neuromuscular education;Balance training;Therapeutic activities;DME and/or AE instruction   OT Goals(Current goals can be found in the care plan section) Acute Rehab OT Goals Patient Stated Goal: Wants to be able to get home OT Goal Formulation: With patient/family Time For Goal Achievement: 08/02/13 Potential to Achieve Goals: Good ADL Goals Pt Will Perform Grooming: with set-up;with supervision;standing Pt Will Perform Upper Body Bathing: with set-up;with supervision;sitting;standing Pt Will Perform Lower Body Bathing: with min assist;with min guard assist;sitting/lateral leans;sit to/from stand Pt Will Perform Upper Body Dressing: with supervision;with set-up;sitting;standing Pt Will Perform Lower Body Dressing: with min assist;with min guard assist;sitting/lateral leans;sit to/from stand Pt Will Transfer to Toilet: with supervision;with modified independence;ambulating;grab bars (3 in 1 over toilet) Pt Will Perform Toileting - Clothing Manipulation and hygiene: with min guard assist;with supervision;sit to/from stand Pt Will Perform Tub/Shower Transfer: with min guard assist;with supervision;shower seat  Visit Information  Last OT Received On: 07/26/13 Assistance  Needed: +1 History of Present Illness: HPI: Mark Joseph. is a 80 y.o. male w/ no prior cardiac history, PMHx s/f CVA in 2013, HTN, HLD, GERD, recent compression fracture who presents to [facility] ED for constipation, abdominal pain- incidentally found to have new onset atrial fibrillation w/ RVR.        Prior Functioning     Home Living  Family/patient expects to be discharged to:: Private residence Living Arrangements: Spouse/significant other Available Help at Discharge: Family;Available 24 hours/day Type of Home: House Home Access: Stairs to enter Entrance Stairs-Rails: None Home Layout: Two level;Able to live on main level with bedroom/bathroom Home Equipment: Bedside commode;Cane - quad;Cane - single point;Walker - 2 wheels Prior Function Level of Independence: Independent Comments: Prior to fall apprx 1 week ago, pt was independent, driving, [facility] Communication Communication: No difficulties;HOH Dominant Hand: Right         Vision/Perception Vision - History Baseline Vision: Wears glasses all the time Patient Visual Report: No change from baseline Perception Perception: Within Functional Limits   Cognition  Cognition Arousal/Alertness: Awake/alert Behavior During Therapy: WFL for tasks assessed/performed Overall Cognitive Status: Within Functional Limits for tasks assessed    Extremity/Trunk Assessment Upper Extremity Assessment Upper Extremity Assessment: Generalized weakness Lower Extremity Assessment Lower Extremity Assessment: Defer to PT evaluation Cervical / Trunk Assessment Cervical / Trunk Assessment: Kyphotic     Mobility Bed Mobility Overal bed mobility: Needs Assistance Bed Mobility: Sit to Supine Sit to supine: Min assist General bed mobility comments: pt sitting in recliner upon entering room. Cues for back precautions to get back into bed Transfers Overall transfer level: Needs assistance Equipment used: Rolling walker (2  wheeled) Transfers: Sit to/from Stand Sit to Stand: Min guard General transfer comment: cues for safe technique and hand placement          Balance Balance Overall balance assessment: Needs assistance Sitting-balance support: No upper extremity supported;Feet supported Sitting balance-Leahy Scale: Fair Standing balance support: Single extremity supported;Bilateral upper extremity supported;During functional activity Standing balance-Leahy Scale: Poor   End of Session OT - End of Session Equipment Utilized During Treatment: Rolling walker;Other (comment) (3 in 1) Activity Tolerance: Patient tolerated treatment well Patient left: in bed;with call bell/phone within reach;with family/visitor present  GO     Galen ManilaSpencer, Male Iafrate Jeanette 07/26/2013, 10:31 AM

## 2013-07-26 NOTE — Progress Notes (Signed)
Inpatient Rehabilitation  I have just received insurance authorization and we  will plan to admit pt. to IP Rehab tomorrow.  In my absence on Thursday and Friday, please contact Ottie GlazierBarbara Boyette at [phone] if questions.  Thanks!  Weldon PickingSusan Blanchard [place] PT Inpatient Rehab Admissions Coordinator Cell [phone] Office [phone]

## 2013-07-26 NOTE — PMR Pre-admission (Signed)
PMR Admission Coordinator Pre-Admission Assessment  Patient: Mark Joseph. is an 80 y.o., male MRN: [id number] DOB: [birth year] Height: 5\' 11"  (180.3 cm) Weight: 69.6 kg (153 lb 7 oz)              Insurance Information HMO: yes    PPO:      PCP:      IPA:      80/20:      OTHER:   PRIMARY:  Blue Medicare      Policy#: [id number]      Subscriber: self CM Name: Mark Joseph      Phone#: [phone]     Fax#: [phone] Pre-Cert#: [id number]; authorized 3/19-3/26, update due 3/25      Employer:  retired Benefits:  Phone #: [phone]     Name: Mark Joseph. Date:  05/11/2013     Deduct: none      Out of Pocket Max: $4500      Life Max: none CIR: 100%, no copay      SNF: 100% days 1-100 Outpatient: 100%     Co-Pay: none, based on medical necessity Home Health: 100%      Co-Pay:  None   DME: 80%     Co-Pay: 20% Providers:  In network  Medicaid Application Date:       Case Manager:  Disability Application Date:       Case Worker:   Emergency Conservator, museum/gallery Information   Name Relation Home Work Mobile   Mark Joseph,Mark Joseph Spouse [phone]  [phone]     Current Medical History  Patient Admitting Diagnosis: deconditioning after multiple medical issues,  Recent L2 fx  History of Present Illness:Mark A Orris Perin. is a 79 y.o. 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 Patient presented to [facility] 07/22/2013 with constipation, abdominal pain an incidental finding of new onset atrial fibrillation with RVR. Cardiology services followup placed on Cardizem drip. Echocardiogram with grade 1 diastolic dysfunction. Systolic function mildly to moderately reduced. Placed on ELIQUIS for atrial fibrillation as well as history of CVA. Cardiac rate appears controlled with the addition of amiodarone. Bouts of urinary retention requiring Foley catheter  and maintained on Flomax as well as Proscar.   Past Medical History  Past Medical History  Diagnosis Date  . Hypertension   . GERD (gastroesophageal reflux disease)   . Osteoporosis   . High cholesterol   . Stroke 01/2012    "small; affected his speech; pretty much back to normal now" (09/15/2012)  . Pneumonitis 1971  . Arthritis     "knees; right is worse" (09/15/2012)    Family History  family history is not on file.  Prior Rehab/Hospitalizations: hospitalized for CVA 1 1/2 years ago per pt.  Home health therapies received. pt works out at [employer] daily. Has had outpt therapy also   Current Medications  Current facility-administered medications:amiodarone (PACERONE) tablet 200 mg, 200 mg, Oral, BID, Marinus Maw, MD, 200 mg at 07/27/13 1059;  apixaban (ELIQUIS) tablet 5 mg, 5 mg, Oral, BID, Roger A Arguello, PA-C, 5 mg at 07/27/13 1202;  finasteride (PROSCAR) tablet 5 mg, 5 mg, Oral, Daily, Roger A Arguello, PA-C, 5 mg at 07/27/13 1058 hydrochlorothiazide (MICROZIDE) capsule 12.5 mg, 12.5 mg, Oral, Daily, Pricilla Riffle, MD, 12.5 mg at 07/27/13 1059;  lidocaine (LIDODERM) 5 % 1 patch, 1 patch, Transdermal, Q24H, Roger A Arguello, PA-C, 1  patch at 07/26/13 2021;  lisinopril (PRINIVIL,ZESTRIL) tablet 10 mg, 10 mg, Oral, Daily, Pricilla RifflePaula V Ross, MD, 10 mg at 07/27/13 1059;  metoprolol (LOPRESSOR) injection 5 mg, 5 mg, Intravenous, Q4H PRN, Gery Prayoger A Arguello, PA-C, 5 mg at 07/24/13 1724 metoprolol (LOPRESSOR) tablet 50 mg, 50 mg, Oral, BID, Quintella Reichertraci R Turner, MD, 50 mg at 07/27/13 1058;  pantoprazole (PROTONIX) EC tablet 40 mg, 40 mg, Oral, Daily, Roger A Arguello, PA-C, 40 mg at 07/27/13 1100;  polyethylene glycol (MIRALAX / GLYCOLAX) packet 17 g, 17 g, Oral, Daily, Roger A Arguello, PA-C, 17 g at 07/27/13 1100;  selenium tablet 200 mcg, 200 mcg, Oral, Daily, Roger A Arguello, PA-C, 200 mcg at 07/23/13 [id number] simvastatin (ZOCOR) tablet 10 mg, 10 mg, Oral, QHS, Roger A Arguello, PA-C, 10 mg at 07/26/13 2130;   tamsulosin (FLOMAX) capsule 0.4 mg, 0.4 mg, Oral, Daily, Quintella Reichertraci R Turner, MD, 0.4 mg at 07/27/13 1058  Patients Current Diet heart healthy, thin liquids  Precautions / Restrictions Precautions Precautions: Fall;Back Precaution Booklet Issued: No Precaution Comments: Becomes tachy with activity Restrictions Weight Bearing Restrictions: No   Prior Activity Level Community (5-7x/wk): Pt. is an independent 80 year old male who [facility] to ride the recumbent bike, still drives. Drives short distances only  Journalist, newspaperHome Assistive Devices / Equipment Home Assistive Devices/Equipment: Cane (specify quad or straight);Walker (specify type) Home Equipment: Bedside commode;Cane - quad;Cane - single point;Walker - 2 wheels  Prior Functional Level Prior Function Level of Independence: Independent Comments: Prior to fall apprx 1 week ago, pt was independent, driving, [facility]  Current Functional Level Cognition  Overall Cognitive Status: Within Functional Limits for tasks assessed    Extremity Assessment (includes Sensation/Coordination)          ADLs    Grooming: Performed;Wash/dry hands;Wash/dry face;Min guard  Where Assessed - Grooming: Supported standing  Upper Body Bathing: Simulated;Supervision/safety;Set up;Min guard  Where Assessed - Upper Body Bathing: Unsupported sitting  Lower Body Bathing: Simulated;Moderate assistance  Upper Body Dressing: Performed;Supervision/safety;Set up;Min guard  Where Assessed - Upper Body Dressing: Unsupported sitting  Lower Body Dressing: Performed;Moderate assistance  Toilet Transfer: Performed;Min guard  StatisticianToilet Transfer Method: Sit to Production managerstand  Toilet Transfer Equipment: Grab bars;Raised toilet seat with arms (or 3-in-1 over toilet)  Toileting - Clothing Manipulation and Hygiene: Performed;Minimal assistance;Min guard  Where Assessed - Glass blower/designerToileting Clothing Manipulation and Hygiene: Standing  Tub/Shower Transfer:  Simulated;Minimal assistance  Equipment Used: Rolling walker;Other (comment) (3 in 1)  Transfers/Ambulation Related to ADLs: cues for safe technique and hand placement     Mobility  Overal bed mobility: Needs Assistance Bed Mobility: Sit to Supine Supine to sit: Supervision Sit to supine: Min assist General bed mobility comments: pt sitting in recliner upon entering room. Cues for back precautions to get back into bed    Transfers  Overall transfer level: Needs assistance Equipment used: Rolling walker (2 wheeled) Transfers: Sit to/from Stand Sit to Stand: Min assist Stand pivot transfers: Min assist General transfer comment: Min assist from lower recliner chair.  Assist needed to support trunk during transitions and to anteriorly weight shift his body over his feet.  he has a tendancy towards posterior lean.      Ambulation / Gait / Stairs / Wheelchair Mobility  Ambulation/Gait Ambulation/Gait assistance: ArchitectMin assist Ambulation Distance (Feet): 150 Feet Assistive device: Rolling walker (2 wheeled) Gait Pattern/deviations: Step-through pattern;Trunk flexed Gait velocity: decreased1.46 ft/sec Gait velocity interpretation: <1.8 ft/sec, indicative of risk for recurrent falls  General Gait Details: Pt with flexed posture, shuffling pattern.  he reports he has never had to use a RW in the past.      Posture / Balance      Special needs/care consideration BiPAP/CPAP  no CPM  no Continuous Drip IV  no Dialysis  no         Life Vest  no Oxygen  no Special Bed  no Trach Size  no Wound Vac (area)  no       Skin  Wife reports pt. Has rash on back which has worsened since this hospitalization                               Bowel mgmt: pt. Reports incontinence this am, likely due to slow response for assist Bladder mgmt:  Foley catheter Has had voiding issues before Diabetic mgmt no  PT reports pt. Becomes tachy with activity    Previous Home Environment Living Arrangements:  Spouse/significant other Available Help at Discharge: Family;Available 24 hours/day Type of Home: House Home Layout: Two level;Able to live on main level with bedroom/bathroom Home Access: Stairs to enter Entrance Stairs-Rails: None Entrance Stairs-Number of Steps: 2 Bathroom Shower/Tub: Health visitor: Standard Bathroom Accessibility: Yes How Accessible: Accessible via walker Home Care Services: No  Discharge Living Setting Plans for Discharge Living Setting: Patient's home Type of Home at Discharge: House Discharge Home Layout: Two level;Able to live on main level with bedroom/bathroom Discharge Home Access: Stairs to enter Entrance Stairs-Rails: None Entrance Stairs-Number of Steps: 2 Discharge Bathroom Shower/Tub: Walk-in shower Discharge Bathroom Toilet: Handicapped height Discharge Bathroom Accessibility: Yes How Accessible: Accessible via walker Does the patient have any problems obtaining your medications?: No  Social/Family/Support Systems Patient Roles: Spouse;Parent Anticipated Caregiver: Mark Joseph Anticipated Caregiver's Contact Information: [phone] cell Ability/Limitations of Caregiver: wife unable to provide physical assist Caregiver Availability: 24/7 Discharge Plan Discussed with Primary Caregiver: Yes Is Caregiver In Agreement with Plan?: Yes Does Caregiver/Family have Issues with Lodging/Transportation while Pt is in Rehab?: No    Goals/Additional Needs Patient/Family Goal for Rehab: mod (I) to (S) with PT/OT; n/a SLP Expected length of stay: 7-10 days Cultural Considerations: none per pt. Dietary Needs: heart healthy, thin liquids Pt/Family Agrees to Admission and willing to participate: Yes Program Orientation Provided & Reviewed with Pt/Caregiver Including Roles  & Responsibilities: Yes   Decrease burden of Care through IP rehab admission: no   Possible need for SNF placement upon discharge: not anticipated   Patient  Condition: This patient's medical and functional status has changed since the consult dated: 07/24/13 in which the Rehabilitation Physician determined and documented that the patient's condition is appropriate for intensive rehabilitative care in an inpatient rehabilitation facility. See "History of Present Illness" (above) for medical update. Functional changes are:  Pt. Needs min assist with ambulation 150", mod assist for lower body bathing, min to min guard for toileting and clothing manipulation.   Patient's medical and functional status update has been discussed with the Rehabilitation physician and patient remains appropriate for inpatient rehabilitation. Will admit to inpatient rehab today.  Preadmission Screen Completed By:  Clois Dupes PT , 07/27/2013 12:05 PM ______________________________________________________________________   Discussed status with Dr. Wynn Banker on 07/27/13 at  1205 and received telephone approval for admission today.  Admission Coordinator:  Clois Dupes, PT time 0981 Date 07/27/13.

## 2013-07-26 NOTE — Progress Notes (Signed)
Physical Therapy Treatment Patient Details Name: Mark CanalesLeon A Keysor Jr. MRN: [id number] DOB: 11/16/1921 Today's Date: 07/26/2013 Time: [id number] PT Time Calculation (min): 19 min  PT Assessment / Plan / Recommendation  History of Present Illness HPI: Mark CanalesLeon A Welty Jr. is a 80 y.o. male w/ no prior cardiac history, PMHx s/f CVA in 2013, HTN, HLD, GERD, recent compression fracture who [facility] for constipation, abdominal pain- incidentally found to have new onset atrial fibrillation w/ RVR.    PT Comments   Pt is progressing well with therapy, but is still not at his baseline of walking with a quad cane.  He is at high risk of falls as indicated by his slow gait speed and LOB posteriorly without upper extremity assist.     Follow Up Recommendations  CIR     Does the patient have the potential to tolerate intense rehabilitation    Yes  Barriers to Discharge  None      Equipment Recommendations  Rolling walker with 5" wheels;3in1 (PT)    Recommendations for Other Services   None  Frequency Min 3X/week   Progress towards PT Goals Progress towards PT goals: Progressing toward goals  Plan Current plan remains appropriate    Precautions / Restrictions Precautions Precautions: Fall;Back Precaution Comments: Becomes tachy with activity   Pertinent Vitals/Pain HR 70s-80s   Mobility  Transfers Overall transfer level: Needs assistance Equipment used: Rolling walker (2 wheeled) Transfers: Sit to/from Stand Sit to Stand: Min assist General transfer comment: Min assist from lower recliner chair.  Assist needed to support trunk during transitions and to anteriorly weight shift his body over his feet.  he has a tendancy towards posterior lean.   Ambulation/Gait Ambulation/Gait assistance: Min assist Ambulation Distance (Feet): 150 Feet Assistive device: Rolling walker (2 wheeled) Gait Pattern/deviations: Step-through pattern;Trunk flexed Gait velocity: decreased1.46 ft/sec Gait  velocity interpretation: <1.8 ft/sec, indicative of risk for recurrent falls General Gait Details: Pt with flexed posture, shuffling pattern.  he reports he has never had to use a RW in the past.      Exercises General Exercises - Upper Extremity Shoulder Flexion: AROM;Both;10 reps;Seated Elbow Flexion: AROM;Both;10 reps;Seated General Exercises - Lower Extremity Long Arc Quad: AROM;Left;10 reps;Seated (unable to do right due to pain with exercises) Hip ABduction/ADduction: AROM;Both;10 reps;Seated (adduct against pillow) Hip Flexion/Marching: AROM;Both;10 reps;Seated Toe Raises: AROM;Both;10 reps;Seated Heel Raises: AROM;Both;10 reps;Seated     PT Goals (current goals can now be found in the care plan section) Acute Rehab PT Goals Patient Stated Goal: Wants to be able to get home  Visit Information  Last PT Received On: 07/26/13 Assistance Needed: +1 History of Present Illness: HPI: Mark CanalesLeon A Spoonemore Jr. is a 80 y.o. male w/ no prior cardiac history, PMHx s/f CVA in 2013, HTN, HLD, GERD, recent compression fracture who [facility] for constipation, abdominal pain- incidentally found to have new onset atrial fibrillation w/ RVR.     Subjective Data  Subjective: Pt is more concerned about his ability to go to the bathroom.  No significant c/o pain.   Patient Stated Goal: Wants to be able to get home   Cognition  Cognition Arousal/Alertness: Awake/alert Behavior During Therapy: WFL for tasks assessed/performed Overall Cognitive Status: Within Functional Limits for tasks assessed    Balance  Balance Standing balance support: No upper extremity supported Standing balance-Leahy Scale: Poor Standing balance comment: tendancy towards posterior LOB without upper extremity supported.  General Comments General comments (skin integrity, edema, etc.): HR  in the 70s-80s throughout session/mobility  End of Session PT - End of Session Equipment Utilized During Treatment: Gait  belt Activity Tolerance: Patient tolerated treatment well Patient left: in chair;with call bell/phone within reach;with family/visitor present     Lurena Joiner B. Shaquisha Wynn, PT, DPT [phone]   07/26/2013, 3:19 PM

## 2013-07-26 NOTE — Discharge Summary (Addendum)
ELECTROPHYSIOLOGY DISCHARGE SUMMARY   Patient ID: Mark CanalesLeon A Slovacek Jr.,  MRN: [id number], DOB/AGE: 78/07/1921 78 y.o.  Admit date: 07/22/2013 Discharge date: 07/27/2013  Primary Care Physician: Mark FunkJohn Griffin, MD Primary EP: Mark BuntingGregg Gwendolyn Mclees, MD   Primary Discharge Diagnosis:  1. Paroxysmal atrial tachycardia, also questionable atypical atrial flutter as well as possible AVNRT  2. Mild to moderate LV dysfunction (with RWMAs) and mild to moderate RV dysfunction  3. Physical deconditioning  Secondary Discharge Diagnoses:  1. Recent mechanical fall with L2 fracture  2. Prior CVA 2013  3. HTN 4. Osteoarthritis  Procedures This Admission:  1. 2D echocardiogram Study Conclusions - Left ventricle: Difficult acoustic windows make evaluation difficult. LVEF appears mild to moderately depressed with hypokinesis inf the distal inferior, distal septal, mid/distal lateral and apical walls. Doppler parameters are consistent with abnormal left ventricular relaxation (grade 1 diastolic dysfunction). - Right ventricle: The cavity size was mildly dilated. Systolic function was mildly to moderately reduced. - Tricuspid valve: Moderate regurgitation. - Pulmonary arteries: PA peak pressure: 39mm Hg (S). Impressions: - Acoustic windows limit comparison with echo from 2013  but LV function appears worse, RV is larger with depressed function and TR is more severe.  History and Hospital Course:  Please see admission history and physical for full details of history. Briefly, Mark Joseph presented to the ED with severe constipation after recently starting pain medication for L2 fracture. In the ED he was tachycardic and initial ECG is most consistent with an atrial tachycardia with underlying RBBB. EP was consulted. On telemetry he had what appeared to be most consistent with an atrial tachycardia but atypical atrial flutter and/or atypical AVNRT could not be excluded. Plavix was stopped and he was started on  Eliquis. He had frequent PACs and runs of atrial tachycardia. He was asymptomatic; however in setting of LV dysfunction aggressive aggressive rate / rhythm control is warranted. Options for treatment were reviewed including medical therapy versus EPS +RF ablation. However, given that he is asymptomatic with advanced age, conservative management was elected and he started amiodarone. His tachycardia improved and he remained hemodynamically stable. He was evaluated by PT/OT and CIR or SNF placement was recommended for continued rehabilitation. He has been accepted to CIR. He has been seen, examined and deemed stable for discharge today by Mark Joseph.  Discharge Vitals: Blood pressure 144/84, pulse 65, temperature 98.8 F (37.1 C), temperature source Oral, resp. rate 16, height 5\' 11"  (1.803 m), weight 153 lb 7 oz (69.6 kg), SpO2 98.00%.   Labs: Lab Results  Component Value Date   WBC 7.3 07/22/2013   HGB 12.1* 07/22/2013   HCT 33.9* 07/22/2013   MCV 91.6 07/22/2013   PLT 260 07/22/2013     Recent Labs Lab 07/22/13 0923  NA 135*  K 4.6  CL 94*  CO2 28  BUN 19  CREATININE 0.86  CALCIUM 9.5  PROT 6.5  BILITOT 0.5  ALKPHOS 44  ALT 21  AST 28  GLUCOSE 123*   Lab Results  Component Value Date   TROPONINI <0.30 07/22/2013    Disposition:  The patient is being discharged in stable condition.  Follow-up:     Follow-up Information   Follow up with Evie LacksARL, SALLY R, RPH On 08/03/2013. (At 8:30 AM to establish care in anticoagulation)    Specialty:  Pharmacist   Contact information:   [address] [place] [address] [phone]       Follow up with Mark BuntingGregg Michell Kader, MD On 08/10/2013. (  At 10:15 AM)    Specialty:  Cardiology   Contact information:   [address] [place] [address] [phone]      Discharge Medications:    Medication List    STOP taking these medications       clopidogrel 75 MG tablet  Commonly known as:  PLAVIX      TAKE  these medications       alendronate 70 MG tablet  Commonly known as:  FOSAMAX  Take 70 mg by mouth every 7 (seven) days. Take on Sundays. Take with a full glass of water on an empty stomach.     amiodarone 200 MG tablet  Commonly known as:  PACERONE  Take 1 tablet (200 mg) by mouth twice daily for 4 weeks then take 1 tablet (200 mg) once daily thereafter     apixaban 5 MG Tabs tablet  Commonly known as:  ELIQUIS  Take 1 tablet (5 mg total) by mouth 2 (two) times daily.     CALCIUM PO  Take 1 tablet by mouth 2 (two) times daily.     finasteride 5 MG tablet  Commonly known as:  PROSCAR  Take 5 mg by mouth daily.     lidocaine 5 %  Commonly known as:  LIDODERM  Place 1 patch onto the skin daily. Remove & Discard patch within 12 hours or as directed by MD     lisinopril-hydrochlorothiazide 20-25 MG per tablet  Commonly known as:  PRINZIDE,ZESTORETIC  Take 1 tablet by mouth daily.     metoprolol 50 MG tablet  Commonly known as:  LOPRESSOR  Take 1 tablet (50 mg total) by mouth 2 (two) times daily.     pantoprazole 40 MG tablet  Commonly known as:  PROTONIX  Take 40 mg by mouth daily.     polyethylene glycol packet  Commonly known as:  MIRALAX / GLYCOLAX  Take 17 g by mouth daily.     pravastatin 20 MG tablet  Commonly known as:  PRAVACHOL  Take 20 mg by mouth every evening.     selenium 50 MCG Tabs tablet  Take 200 mcg by mouth daily.     tamsulosin 0.4 MG Caps capsule  Commonly known as:  FLOMAX  Take 1 capsule (0.4 mg total) by mouth daily.     traMADol 50 MG tablet  Commonly known as:  ULTRAM  Take 50 mg by mouth every 6 (six) hours as needed.       Duration of Discharge Encounter: Greater than 30 minutes including physician time.  Limmie Patricia, PA-C 07/27/2013, 7:58 AM  Cardiology Attending  Patient seen and examined. Agree with above history, physical exam, assessment and plan. Ok for discharge to rehab. Continue amiodarone.  Leonia Reeves.D.

## 2013-07-27 ENCOUNTER — Inpatient Hospital Stay (HOSPITAL_COMMUNITY)
Admission: RE | Admit: 2013-07-27 | Discharge: 2013-08-09 | DRG: 946 | Disposition: A | Discharge status: Home Health | Payer: Medicare Other | Source: Intra-hospital | Attending: Physical Medicine & Rehabilitation | Admitting: Physical Medicine & Rehabilitation

## 2013-07-27 DIAGNOSIS — S32009A Unspecified fracture of unspecified lumbar vertebra, initial encounter for closed fracture: Secondary | ICD-10-CM

## 2013-07-27 DIAGNOSIS — I4891 Unspecified atrial fibrillation: Secondary | ICD-10-CM

## 2013-07-27 DIAGNOSIS — T448X5A Adverse effect of centrally-acting and adrenergic-neuron-blocking agents, initial encounter: Secondary | ICD-10-CM

## 2013-07-27 DIAGNOSIS — Z7902 Long term (current) use of antithrombotics/antiplatelets: Secondary | ICD-10-CM

## 2013-07-27 DIAGNOSIS — I498 Other specified cardiac arrhythmias: Secondary | ICD-10-CM

## 2013-07-27 DIAGNOSIS — Z87891 Personal history of nicotine dependence: Secondary | ICD-10-CM

## 2013-07-27 DIAGNOSIS — R5381 Other malaise: Secondary | ICD-10-CM

## 2013-07-27 DIAGNOSIS — Z5189 Encounter for other specified aftercare: Principal | ICD-10-CM

## 2013-07-27 DIAGNOSIS — R350 Frequency of micturition: Secondary | ICD-10-CM | POA: Diagnosis not present

## 2013-07-27 DIAGNOSIS — Z79899 Other long term (current) drug therapy: Secondary | ICD-10-CM | POA: Diagnosis not present

## 2013-07-27 DIAGNOSIS — R339 Retention of urine, unspecified: Secondary | ICD-10-CM

## 2013-07-27 DIAGNOSIS — W19XXXA Unspecified fall, initial encounter: Secondary | ICD-10-CM

## 2013-07-27 DIAGNOSIS — IMO0002 Reserved for concepts with insufficient information to code with codable children: Secondary | ICD-10-CM | POA: Diagnosis not present

## 2013-07-27 DIAGNOSIS — M1710 Unilateral primary osteoarthritis, unspecified knee: Secondary | ICD-10-CM | POA: Diagnosis not present

## 2013-07-27 DIAGNOSIS — Z8673 Personal history of transient ischemic attack (TIA), and cerebral infarction without residual deficits: Secondary | ICD-10-CM | POA: Diagnosis not present

## 2013-07-27 DIAGNOSIS — M810 Age-related osteoporosis without current pathological fracture: Secondary | ICD-10-CM | POA: Diagnosis not present

## 2013-07-27 DIAGNOSIS — K219 Gastro-esophageal reflux disease without esophagitis: Secondary | ICD-10-CM | POA: Diagnosis not present

## 2013-07-27 DIAGNOSIS — I10 Essential (primary) hypertension: Secondary | ICD-10-CM | POA: Diagnosis not present

## 2013-07-27 DIAGNOSIS — E785 Hyperlipidemia, unspecified: Secondary | ICD-10-CM

## 2013-07-27 DIAGNOSIS — I639 Cerebral infarction, unspecified: Secondary | ICD-10-CM

## 2013-07-27 MED ORDER — AMIODARONE HCL 200 MG PO TABS
200.0000 mg | ORAL_TABLET | Freq: Two times a day (BID) | ORAL | Status: DC
  Administered 2013-07-27 – 2013-08-09 (×26): 200 mg via ORAL
  Filled 2013-07-27 (×28): qty 1

## 2013-07-27 MED ORDER — HYDROCHLOROTHIAZIDE 12.5 MG PO CAPS
12.5000 mg | ORAL_CAPSULE | Freq: Every day | ORAL | Status: DC
  Administered 2013-07-28 – 2013-08-09 (×13): 12.5 mg via ORAL
  Filled 2013-07-27 (×14): qty 1

## 2013-07-27 MED ORDER — LIDOCAINE 5 % EX PTCH
1.0000 | MEDICATED_PATCH | CUTANEOUS | Status: DC
  Administered 2013-07-28 – 2013-07-29 (×2): 1 via TRANSDERMAL
  Filled 2013-07-27 (×3): qty 1

## 2013-07-27 MED ORDER — LISINOPRIL 10 MG PO TABS
10.0000 mg | ORAL_TABLET | Freq: Every day | ORAL | Status: DC
  Administered 2013-07-28 – 2013-08-09 (×13): 10 mg via ORAL
  Filled 2013-07-27 (×14): qty 1

## 2013-07-27 MED ORDER — ONDANSETRON HCL 4 MG/2ML IJ SOLN: 4.0000 mg | Freq: Four times a day (QID) | INTRAMUSCULAR | Status: DC | PRN

## 2013-07-27 MED ORDER — TAMSULOSIN HCL 0.4 MG PO CAPS
0.4000 mg | ORAL_CAPSULE | Freq: Every day | ORAL | Status: DC
  Administered 2013-07-28 – 2013-07-30 (×3): 0.4 mg via ORAL
  Filled 2013-07-27 (×5): qty 1

## 2013-07-27 MED ORDER — APIXABAN 5 MG PO TABS
5.0000 mg | ORAL_TABLET | Freq: Two times a day (BID) | ORAL | Status: DC
  Administered 2013-07-27 – 2013-08-09 (×26): 5 mg via ORAL
  Filled 2013-07-27 (×28): qty 1

## 2013-07-27 MED ORDER — SIMVASTATIN 10 MG PO TABS
10.0000 mg | ORAL_TABLET | Freq: Every day | ORAL | Status: DC
  Administered 2013-07-27 – 2013-08-08 (×13): 10 mg via ORAL
  Filled 2013-07-27 (×14): qty 1

## 2013-07-27 MED ORDER — SORBITOL 70 % SOLN: 30.0000 mL | Freq: Every day | Status: DC | PRN

## 2013-07-27 MED ORDER — PANTOPRAZOLE SODIUM 40 MG PO TBEC
40.0000 mg | DELAYED_RELEASE_TABLET | Freq: Every day | ORAL | Status: DC
  Administered 2013-07-28 – 2013-08-09 (×13): 40 mg via ORAL
  Filled 2013-07-27 (×13): qty 1

## 2013-07-27 MED ORDER — SELENIUM 50 MCG PO TABS
200.0000 ug | ORAL_TABLET | Freq: Every day | ORAL | Status: DC
  Administered 2013-07-28 – 2013-08-09 (×3): 200 ug via ORAL
  Filled 2013-07-27 (×14): qty 4

## 2013-07-27 MED ORDER — ACETAMINOPHEN 325 MG PO TABS
325.0000 mg | ORAL_TABLET | ORAL | Status: DC | PRN
  Administered 2013-07-28 – 2013-08-08 (×24): 650 mg via ORAL
  Filled 2013-07-27 (×24): qty 2

## 2013-07-27 MED ORDER — FINASTERIDE 5 MG PO TABS
5.0000 mg | ORAL_TABLET | Freq: Every day | ORAL | Status: DC
  Administered 2013-07-28 – 2013-08-09 (×13): 5 mg via ORAL
  Filled 2013-07-27 (×14): qty 1

## 2013-07-27 MED ORDER — METOPROLOL TARTRATE 50 MG PO TABS
50.0000 mg | ORAL_TABLET | Freq: Two times a day (BID) | ORAL | Status: DC
  Administered 2013-07-27 – 2013-08-05 (×18): 50 mg via ORAL
  Filled 2013-07-27 (×22): qty 1

## 2013-07-27 MED ORDER — ONDANSETRON HCL 4 MG PO TABS: 4.0000 mg | ORAL_TABLET | Freq: Four times a day (QID) | ORAL | Status: DC | PRN

## 2013-07-27 MED ORDER — POLYETHYLENE GLYCOL 3350 17 G PO PACK
17.0000 g | PACK | Freq: Every day | ORAL | Status: DC
  Administered 2013-07-28 – 2013-08-08 (×10): 17 g via ORAL
  Filled 2013-07-27 (×15): qty 1

## 2013-07-27 NOTE — Progress Notes (Addendum)
Received pt. As a transfer from [address].Pt. Is alert and oriented,from home with wife.No equipment at bedside.Pt. Sign the fall risk prevention contract.Pt. And family were oriented to unit and settings.Keep monitoring pt. Closely and assessing his needs.Safety video was showed to pt. And family.

## 2013-07-27 NOTE — Progress Notes (Signed)
Inpt rehab bed is available today and pt and his wife are in agreement to admission. I will arrange. [phone]

## 2013-07-27 NOTE — H&P (Signed)
Physical Medicine and Rehabilitation Admission H&P  Chief Complaint   Patient presents with   .  Constipation   :  Chief complaint: weakness  HPI: Mark CanalesLeon A Vanduyne Jr. is a 80 y.o. 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 Patient presented to [facility] 07/22/2013 with constipation, abdominal pain an incidental finding of new onset atrial fibrillation with RVR. Cardiology services followup placed on Cardizem drip. Echocardiogram with grade 1 diastolic dysfunction. Systolic function mildly to moderately reduced. Placed on ELIQUIS for atrial fibrillation as well as history of CVA. Cardiac rate appears controlled with the addition of amiodarone. Bouts of urinary retention requiring Foley catheter and maintained on Flomax as well as Proscar. Physical occupational therapy evaluations completed 07/23/2013 with recommendations of physical medicine rehabilitation consult. Patient was admitted for comprehensive rehabilitation program    ROS Review of Systems  Cardiovascular: Positive for palpitations.  Gastrointestinal: Positive for abdominal pain and constipation.  GERD  Genitourinary: Positive for urgency.  Musculoskeletal: Positive for back pain and falls.  All other systems reviewed and are negative  Past Medical History   Diagnosis  Date   .  Hypertension    .  GERD (gastroesophageal reflux disease)    .  Osteoporosis    .  High cholesterol    .  Stroke  01/2012     "small; affected his speech; pretty much back to normal now" (09/15/2012)   .  Pneumonitis  1971   .  Arthritis      "knees; right is worse" (09/15/2012)    Past Surgical History   Procedure  Laterality  Date   .  Appendectomy   1953   .  Inguinal hernia repair  Bilateral  09/15/2012     w/mesh Hattie Perch/notes 09/15/2012   .  Inguinal hernia repair  Bilateral  09/15/2012     Procedure: HERNIA REPAIR  INGUINAL ADULT BILATERAL; Surgeon: Emelia LoronMatthew Wakefield, MD; [facility]; Service: General; Laterality: Bilateral;   .  Insertion of mesh  Bilateral  09/15/2012     Procedure: INSERTION OF MESH; Surgeon: Emelia LoronMatthew Wakefield, MD; [facility]; Service: General; Laterality: Bilateral;    History reviewed. No pertinent family history.  Social History: reports that he quit smoking about 45 years ago. His smoking use included Cigarettes. He has a 12.5 pack-year smoking history. He has never used smokeless tobacco. He reports that he does not drink alcohol or use illicit drugs.  Allergies: No Known Allergies  Medications Prior to Admission   Medication  Sig  Dispense  Refill   .  alendronate (FOSAMAX) 70 MG tablet  Take 70 mg by mouth every 7 (seven) days. Take on Sundays. Take with a full glass of water on an empty stomach.     Marland Kitchen.  CALCIUM PO  Take 1 tablet by mouth 2 (two) times daily.     .  clopidogrel (PLAVIX) 75 MG tablet  Take 1 tablet (75 mg total) by mouth daily with breakfast.  30 tablet  11   .  finasteride (PROSCAR) 5 MG tablet  Take 5 mg by mouth daily.     Marland Kitchen.  lidocaine (LIDODERM) 5 %  Place 1 patch onto the skin daily. Remove & Discard patch within 12 hours or as directed by MD     .  lisinopril-hydrochlorothiazide (PRINZIDE,ZESTORETIC) 20-25 MG per tablet  Take 1 tablet by mouth daily.     .Marland Kitchen  pantoprazole (PROTONIX) 40 MG tablet  Take 40 mg by mouth daily.     .  polyethylene glycol (MIRALAX / GLYCOLAX) packet  Take 17 g by mouth daily.     .  pravastatin (PRAVACHOL) 20 MG tablet  Take 20 mg by mouth every evening.     .  selenium 50 MCG TABS tablet  Take 200 mcg by mouth daily.     .  tamsulosin (FLOMAX) 0.4 MG CAPS  Take 1 capsule (0.4 mg total) by mouth daily.  30 capsule  1   .  traMADol (ULTRAM) 50 MG tablet  Take 50 mg by mouth every 6 (six) hours as needed.      Home:  Home Living  Family/patient expects to be discharged to:: Private residence  Living Arrangements:  Spouse/significant other  Available Help at Discharge: Family;Available 24 hours/day  Type of Home: House  Home Access: Stairs to enter  [employer] of Steps: 2  Entrance Stairs-Rails: None  Home Layout: Two level;Able to live on main level with bedroom/bathroom  Home Equipment: Bedside commode;Cane - quad;Cane - single point  Functional History:  Prior Function  Comments: Prior to fall apprx 1 week ago, pt was independent, driving, going to the [facility] daily  Functional Status:  Mobility:    Ambulation/Gait  Ambulation Distance (Feet): 100 Feet  Gait velocity: improved; still decreased  General Gait Details: improved step length with decreased no significant deviations noted. Cues to keep RW close   ADL:   Cognition:  Cognition  Overall Cognitive Status: Within Functional Limits for tasks assessed  Cognition  Arousal/Alertness: Awake/alert  Behavior During Therapy: WFL for tasks assessed/performed  Overall Cognitive Status: Within Functional Limits for tasks assessed  Physical Exam:  Blood pressure 123/74, pulse 54, temperature 98.6 F (37 C), temperature source Oral, resp. rate 20, height 5\' 11"  (1.803 m), weight 71.2 kg (156 lb 15.5 oz), SpO2 97.00%.  Physical Exam  Constitutional: He is oriented to person, place, and time.  Frail appearing  HENT:  Head: Normocephalic.  Eyes: EOM are normal.  Neck: Normal range of motion. Neck supple. No thyromegaly present.  Cardiovascular:  Cardiac rate is controlled  Respiratory: Effort normal and breath sounds normal. No respiratory distress.  GI: Soft. Bowel sounds are normal. He exhibits no distension.  Genitourinary:  Foley tube in place  Neurological: He is alert and oriented to person, place, and time.  Patient pleasant and cooperative with exam. He followed commands. A little impulsive. Strength 4/5 in BUE and BLE. Sensory exam grossly intact. Fair insight and awareness.  Skin: Skin is warm and dry  No results  found for this or any previous visit (from the past 48 hour(s)).  No results found.  Post Admission Physician Evaluation:  1. Functional deficits secondary to deconditioning from afib with RVR. 2. Patient is admitted to receive collaborative, interdisciplinary care between the physiatrist, rehab nursing staff, and therapy team. 3. Patient's level of medical complexity and substantial therapy needs in context of that medical necessity cannot be provided at a lesser intensity of care such as a SNF. 4. Patient has experienced substantial functional loss from his/her baseline which was documented above under the "Functional History" and "Functional Status" headings. Judging by the patient's diagnosis, physical exam, and functional history, the patient has potential for functional progress which will result in measurable gains while on inpatient rehab. These gains will be of substantial and practical use upon discharge in facilitating mobility and self-care at the household level. 5.  Physiatrist will provide 24 hour management of medical needs as well as oversight of the therapy plan/treatment and provide guidance as appropriate regarding the interaction of the two. 6. 24 hour rehab nursing will assist with bladder management, bowel management, safety, skin/wound care, disease management, medication administration and patient education and help integrate therapy concepts, techniques,education, etc. 7. PT will assess and treat for/with: pre gait, gait training, endurance , safety, equipment, neuromuscular re education. Goals are: Mod I. 8. OT will assess and treat for/with: ADLs, Cognitive perceptual skills, Neuromuscular re education, safety, endurance, equipment. Goals are: Mod I. 9. SLP will assess and treat for/with: NA. Goals are: NA. 10. Case Management and Social Worker will assess and treat for psychological issues and discharge planning. 11. Team conference will be held weekly to assess progress  toward goals and to determine barriers to discharge. 12. Patient will receive at least 3 hours of therapy per day at least 5 days per week. 13. ELOS: 6-9 days  14. Prognosis: excellent Medical Problem List and Plan:  1. Deconditioning after multi-medical recent L2 fracture  2. DVT Prophylaxis/Anticoagulation: Patient on Eliquis. Monitor for signs of DVT  3. Pain Management: Lidoderm patch, Tylenol. Monitor the increased mobility  4. Neuropsych: This patient is capable of making decisions on his own behalf.  5. Atrial fibrillation/incessant tachycardia. Continue amiodarone as well as Lopressor. Followup cardiology services  6. Hypertension. Continue with lisinopril as well as hydrochlorothiazide with Lopressor 50 mg twice a day. Monitor with increased mobility in  7. History of urinary retention. Continue Flomax and proscar. Voiding trial  8. Hyperlipidemia. Zocor   Erick Colace M.D. [facility] FAAPM&R (Sports Med, Neuromuscular Med) Diplomate Am Board of Electrodiagnostic Med   07/27/2013

## 2013-07-27 NOTE — Progress Notes (Signed)
UR Completed Debora Stockdale Graves-Bigelow, RN,BSN [phone]  

## 2013-07-28 ENCOUNTER — Inpatient Hospital Stay (HOSPITAL_COMMUNITY): Payer: Medicare Other

## 2013-07-28 ENCOUNTER — Inpatient Hospital Stay (HOSPITAL_COMMUNITY): Payer: Medicare Other | Admitting: Physical Therapy

## 2013-07-28 DIAGNOSIS — R5381 Other malaise: Secondary | ICD-10-CM

## 2013-07-28 DIAGNOSIS — S32009A Unspecified fracture of unspecified lumbar vertebra, initial encounter for closed fracture: Secondary | ICD-10-CM

## 2013-07-28 DIAGNOSIS — W19XXXA Unspecified fall, initial encounter: Secondary | ICD-10-CM

## 2013-07-28 LAB — COMPREHENSIVE METABOLIC PANEL
ALBUMIN: 3.2 g/dL — AB (ref 3.5–5.2)
ALT: 16 U/L (ref 0–53)
AST: 16 U/L (ref 0–37)
Alkaline Phosphatase: 61 U/L (ref 39–117)
BUN: 20 mg/dL (ref 6–23)
CHLORIDE: 97 meq/L (ref 96–112)
CO2: 25 mEq/L (ref 19–32)
CREATININE: 1.05 mg/dL (ref 0.50–1.35)
Calcium: 8.8 mg/dL (ref 8.4–10.5)
GFR calc Af Amer: 70 mL/min — ABNORMAL LOW (ref >90)
GFR calc non Af Amer: 61 mL/min — ABNORMAL LOW (ref >90)
Glucose, Bld: 98 mg/dL (ref 70–99)
Potassium: 4.7 mEq/L (ref 3.7–5.3)
SODIUM: 135 meq/L — AB (ref 137–147)
Total Bilirubin: 0.4 mg/dL (ref 0.3–1.2)
Total Protein: 6 g/dL (ref 6.0–8.3)

## 2013-07-28 LAB — CBC WITH DIFFERENTIAL/PLATELET
BASOS PCT: 1 % (ref 0–1)
Basophils Absolute: 0 10*3/uL (ref 0.0–0.1)
Eosinophils Absolute: 0.1 10*3/uL (ref 0.0–0.7)
Eosinophils Relative: 2 % (ref 0–5)
HEMATOCRIT: 33.3 % — AB (ref 39.0–52.0)
Hemoglobin: 12 g/dL — ABNORMAL LOW (ref 13.0–17.0)
LYMPHS PCT: 25 % (ref 12–46)
Lymphs Abs: 1.8 10*3/uL (ref 0.7–4.0)
MCH: 32.4 pg (ref 26.0–34.0)
MCHC: 36 g/dL (ref 30.0–36.0)
MCV: 90 fL (ref 78.0–100.0)
MONO ABS: 0.6 10*3/uL (ref 0.1–1.0)
Monocytes Relative: 8 % (ref 3–12)
NEUTROS PCT: 65 % (ref 43–77)
Neutro Abs: 4.6 10*3/uL (ref 1.7–7.7)
Platelets: 340 10*3/uL (ref 150–400)
RBC: 3.7 MIL/uL — ABNORMAL LOW (ref 4.22–5.81)
RDW: 12.8 % (ref 11.5–15.5)
WBC: 7.2 10*3/uL (ref 4.0–10.5)

## 2013-07-28 MED ORDER — MUSCLE RUB 10-15 % EX CREA
TOPICAL_CREAM | CUTANEOUS | Status: DC | PRN
  Administered 2013-07-29: 1 via TOPICAL
  Administered 2013-07-31: 20:00:00 via TOPICAL
  Filled 2013-07-28: qty 85

## 2013-07-28 NOTE — Progress Notes (Signed)
Inpatient Rehabilitation Center Individual Statement of Services  Patient Name:  Mark Joseph  Date:  07/28/2013  Welcome to the Inpatient Rehabilitation Center.  Our goal is to provide you with an individualized program based on your diagnosis and situation, designed to meet your specific needs.  With this comprehensive rehabilitation program, you will be expected to participate in at least 3 hours of rehabilitation therapies Monday-Friday, with modified therapy programming on the weekends.  Your rehabilitation program will include the following services:  Physical Therapy (PT), Occupational Therapy (OT), 24 hour per day rehabilitation nursing, Therapeutic Recreaction (TR), Case Management (Social Worker), Rehabilitation Medicine, Nutrition Services and Pharmacy Services  Weekly team conferences will be held on Tuesdays to discuss your progress.  Your Social Worker will talk with you frequently to get your input and to update you on team discussions.  Team conferences with you and your family in attendance may also be held.  Expected length of stay: 7-10 days  Overall anticipated outcome: mod i goals  Depending on your progress and recovery, your program may change. Your Social Worker will coordinate services and will keep you informed of any changes. Your Social Worker's name and contact numbers are listed  below.  The following services may also be recommended but are not provided by the Inpatient Rehabilitation Center:   Driving Evaluations  Home Health Rehabiltiation Services  Outpatient Rehabilitation Services    Arrangements will be made to provide these services after discharge if needed.  Arrangements include referral to agencies that provide these services.  Your insurance has been verified to be:  [employer] Your primary doctor is:  Dr. Valentina LucksGriffin  Pertinent information will be shared with your doctor and your insurance company.  Social Worker:  Tega CayLucy Misako Roeder, [place] [phone]  or ([phone]) [phone]   Information discussed with and copy given to patient by: Amada JupiterHOYLE, [name], 07/28/2013, 10:56 AM

## 2013-07-28 NOTE — Progress Notes (Signed)
Patient information reviewed and entered into eRehab system by Javid Kemler, RN, CRRN, PPS Coordinator.  Information including medical coding and functional independence measure will be reviewed and updated through discharge.     Per nursing patient was given "Data Collection Information Summary for Patients in Inpatient Rehabilitation Facilities with attached "Privacy Act Statement-Health Care Records" upon admission.  

## 2013-07-28 NOTE — Evaluation (Signed)
Occupational Therapy Assessment and Plan  Patient Details  Name: DEMICO PLOCH MRN: [id number] Date of Birth: [birth year]  OT Diagnosis: abnormal posture, lumbago (low back pain) and muscle weakness (generalized) Rehab Potential: Rehab Potential: Good ELOS: 10-14 days   Today's Date: 07/28/2013 Time: 1100-1200 Time Calculation (min): 60 min  Problem List:  Patient Active Problem List   Diagnosis Date Noted  . Physical deconditioning 07/27/2013  . Atrial fibrillation with RVR 07/22/2013  . UTI (urinary tract infection) 10/12/2012  . S/P bilateral inguinal hernia repair 10/03/2012  . Leukocytosis 10/03/2012  . Hematoma 10/03/2012  . CVA (cerebral infarction) 01/30/2012  . Hypertension 01/30/2012    Past Medical History:  Past Medical History  Diagnosis Date  . Hypertension   . GERD (gastroesophageal reflux disease)   . Osteoporosis   . High cholesterol   . Stroke 01/2012    "small; affected his speech; pretty much back to normal now" (09/15/2012)  . Pneumonitis 1971  . Arthritis     "knees; right is worse" (09/15/2012)   Past Surgical History:  Past Surgical History  Procedure Laterality Date  . Appendectomy  1953  . Inguinal hernia repair Bilateral 09/15/2012    w/mesh Archie Endo 09/15/2012  . Inguinal hernia repair Bilateral 09/15/2012    Procedure: HERNIA REPAIR INGUINAL ADULT BILATERAL;  Surgeon: Rolm Bookbinder, MD;  Location: [facility];  Service: General;  Laterality: Bilateral;  . Insertion of mesh Bilateral 09/15/2012    Procedure: INSERTION OF MESH;  Surgeon: Rolm Bookbinder, MD;  Location: [facility] OR;  Service: General;  Laterality: Bilateral;    Assessment & Plan Clinical Impression: Patient is a 80 y.o. 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 Patient presented to [facility] 07/22/2013 with constipation, abdominal pain an  incidental finding of new onset atrial fibrillation with RVR. Cardiology services followup placed on Cardizem drip. Echocardiogram with grade 1 diastolic dysfunction. Systolic function mildly to moderately reduced. Placed on ELIQUIS for atrial fibrillation as well as history of CVA. Cardiac rate appears controlled with the addition of amiodarone. Bouts of urinary retention requiring Foley catheter and maintained on Flomax as well as Proscar.   Patient transferred to CIR on 07/27/2013.    Patient currently requires max with basic self-care skills secondary to muscle weakness.  Prior to hospitalization, patient could complete BADL with modified independent .  Patient will benefit from skilled intervention to increase independence with basic self-care skills prior to discharge home with care partner.  Anticipate patient will require intermittent supervision and follow up home health.  OT - End of Session Activity Tolerance: Tolerates 30+ min activity with multiple rests Endurance Deficit: Yes OT Assessment Rehab Potential: Good OT Patient demonstrates impairments in the following area(s): Balance;Endurance;Perception;Safety OT Basic ADL's Functional Problem(s): Bathing;Dressing;Toileting OT Transfers Functional Problem(s): Toilet;Tub/Shower OT Plan OT Intensity: Minimum of 1-2 x/day, 45 to 90 minutes OT Frequency: 5 out of 7 days OT Duration/Estimated Length of Stay: 10-14 days OT Treatment/Interventions: Balance/vestibular training;Discharge planning;Disease Lawyer;Therapeutic Exercise;Therapeutic Activities;UE/LE Coordination activities;Self Care/advanced ADL retraining;Functional mobility training;Patient/family education;Pain management OT Self Feeding Anticipated Outcome(s): Independent OT Basic Self-Care Anticipated Outcome(s): Supervision OT Toileting Anticipated Outcome(s): Supervision OT Bathroom Transfers Anticipated Outcome(s):  Supervision OT Recommendation Patient destination: Home Equipment Recommended: To be determined  Skilled Therapeutic Intervention 1:1 OT initial evaluation completed with treatment provided to address adapted bathing/dressing skills, functional transfers, safety awareness, and functional mobility  using RW.   Patient performed bathing/dressing with overall max assist due to impaired standing balance and general weakness w/fatigue.  Patient demo'd LOB during standing bathing and transfers requiring steadying assist and occassional mod assist to rise and recover from low sitting position.   OT Evaluation Precautions/Restrictions  Precautions Precautions: Fall;Back Precaution Booklet Issued: No Precaution Comments: Becomes tachy with activity (per chart review).  Restrictions Weight Bearing Restrictions: No  General Chart Reviewed: Yes Family/Caregiver Present: Yes  Vital Signs Therapy Vitals Pulse Rate: 64 BP: 108/63 mmHg Oxygen Therapy SpO2: 96 % O2 Device: None (Room air)  Pain Pain Assessment Pain Assessment: 0-10 Pain Score: 2  Faces Pain Scale: Hurts even more Pain Type: Surgical pain Pain Location: Back Pain Orientation: Distal;Right Pain Descriptors / Indicators: Aching Pain Onset: On-going Pain Intervention(s): Repositioned;Rest Multiple Pain Sites: No  Home Living/Prior Functioning Home Living Family/patient expects to be discharged to:: Private residence Living Arrangements: Spouse/significant other Available Help at Discharge: Family;Available 24 hours/day Type of Home: House Home Access: Stairs to enter [employer] of Steps: 2 Entrance Stairs-Rails: None Home Layout: Two level;Able to live on main level with bedroom/bathroom  Lives With: Spouse Prior Function Level of Independence: Requires assistive device for independence  Able to Take Stairs?: Yes Driving: Yes Vocation: Retired Comments: Prior to fall apprx 1-2 week ago, pt was  independent, driving, going to the [employer] daily  ADL ADL ADL Comments: see FIM  Vision/Perception  Vision - History Baseline Vision: Wears glasses all the time Patient Visual Report: No change from baseline Vision - Assessment Eye Alignment: Within Functional Limits Perception Perception: Within Functional Limits Praxis Praxis: Intact   Cognition Overall Cognitive Status: Difficult to assess Arousal/Alertness: Awake/alert Orientation Level: Oriented X4 Attention: Sustained Sustained Attention: Appears intact Memory: Impaired Memory Impairment: Decreased recall of new information Awareness: Impaired Awareness Impairment: Emergent impairment Problem Solving: Impaired Problem Solving Impairment: Functional basic Safety/Judgment: Impaired Comments: demo'd inattention to cues and safety precautions during bathing  Sensation Sensation Light Touch: Appears Intact Stereognosis: Appears Intact Hot/Cold: Appears Intact Proprioception: Appears Intact Coordination Gross Motor Movements are Fluid and Coordinated: Yes Fine Motor Movements are Fluid and Coordinated: Yes  Motor  Motor Motor: Within Functional Limits  Mobility  Bed Mobility Bed Mobility: Rolling Left;Left Sidelying to Sit Rolling Left: 4: Min assist;With rail Left Sidelying to Sit: 3: Mod assist;HOB flat Left Sidelying to Sit Details (indicate cue type and reason): Verbal/tactile cues for log roll technique.  Transfers Transfers: Sit to Stand Sit to Stand: 3: Mod assist;4: Min assist;From bed;From chair/3-in-1 Sit to Stand Details (indicate cue type and reason):  Mod assist stand from w/c Stand to Sit: 4: Min assist;To chair/3-in-1 Stand to Sit Details (indicate cue type and reason): Tactile cues for placement Stand to Sit Details: Cues for fully squaring up to chair prrior to sitting down.    Trunk/Postural Assessment  Cervical Assessment Cervical Assessment: Exceptions to [facility] Cervical AROM Overall  Cervical AROM Comments: forward head posture Thoracic Assessment Thoracic Assessment: Exceptions to [facility] Thoracic Strength Overall Thoracic Strength Comments: kyphotic Lumbar Assessment Lumbar Assessment: Exceptions to [facility] Lumbar Strength Overall Lumbar Strength Comments: posterior pelvic tilt - loss of lumbar curve Postural Control Postural Control: Deficits on evaluation Righting Reactions: impaired on evaluation   Balance Balance Balance Assessed: Yes Static Sitting Balance Static Sitting - Balance Support: Feet supported Static Sitting - Level of Assistance: 3: Mod assist;5: Stand by assistance Static Sitting - Comment/# of Minutes: mod assist initially in bed due to posterior lean,  progressed to supervision for rest of session Static Standing Balance Static Standing - Balance Support: Bilateral upper extremity supported Static Standing - Level of Assistance: 4: Min assist  Extremity/Trunk Assessment RUE Assessment RUE Assessment: Within Functional Limits LUE Assessment LUE Assessment: Within Functional Limits  FIM:  FIM - Grooming Grooming Steps: Wash, rinse, dry face;Wash, rinse, dry hands;Brush, comb hair Grooming: 5: Set-up assist to obtain items FIM - Bathing Bathing Steps Patient Completed: Chest;Right Arm;Left Arm;Abdomen;Front perineal area;Right upper leg;Left upper leg Bathing: 3: Mod-Patient completes 5-7 [id number] parts or 50-74% FIM - Upper Body Dressing/Undressing Upper body dressing/undressing steps patient completed: Thread/unthread right sleeve of pullover shirt/dresss;Thread/unthread left sleeve of pullover shirt/dress;Put head through opening of pull over shirt/dress;Pull shirt over trunk;Thread/unthread right sleeve of front closure shirt/dress;Thread/unthread left sleeve of front closure shirt/dress;Pull shirt around back of front closure shirt/dress;Button/unbutton shirt Upper body dressing/undressing: 4: Min-Patient completed 75 plus % of tasks FIM -  Lower Body Dressing/Undressing Lower body dressing/undressing: 1: Total-Patient completed less than 25% of tasks FIM - Bed/Chair Transfer Bed/Chair Transfer: 3: Supine > Sit: Mod A (lifting assist/Pt. 50-74%/lift 2 legs;3: Bed > Chair or W/C: Mod A (lift or lower assist) FIM - TSystems developerDevices: Shower chair;Grab bars Tub/shower Transfers: 3-Into Tub/Shower: Mod A (lift or lower/lift 2 legs);3-Out of Tub/Shower: Mod A (lift or lower/lift 2 legs)   Refer to Care Plan for Long Term Goals  Recommendations for other services: None  Discharge Criteria: Patient will be discharged from OT if patient refuses treatment 3 consecutive times without medical reason, if treatment goals not met, if there is a change in medical status, if patient makes no progress towards goals or if patient is discharged from hospital.  The above assessment, treatment plan, treatment alternatives and goals were discussed and mutually agreed upon: by patient  Second session: Time: 1415-1500 Time Calculation (min):  45 min  Pain Assessment: 2/10, low back  Skilled Therapeutic Interventions: Therapeutic activity with focus on functional mobility, transfer training to [facility], functional mobility using RW and family education with spouse on patient's performance deficits during morning ADL session.   Patient completed functional mobility from his recliner to [facility] over toilet with min assist during transfer and min guard assist during functional mobility.   Patient simulated his preferred toilet hygiene method, standing, and acknowledged recommendation for assist during toileting tasks d/t impaired standing balance.  See FIM for current functional status  Therapy/Group: Individual Therapy  Arnetha Silverthorne 07/28/2013, 3:00 PM

## 2013-07-28 NOTE — IPOC Note (Signed)
Overall Plan of Care [facility](IPOC) Patient Details Name: Mark CroonLeon A Velador MRN: [id number] DOB: 03/06/1922  Admitting Diagnosis: DECONDITIONED, L2 FX  Hospital Problems: Active Problems:   Physical deconditioning     Functional Problem List: Nursing Bladder;Bowel;Pain;Safety;Skin Integrity  PT Balance;Endurance;Motor;Pain;Safety  OT Balance;Endurance;Perception;Safety  SLP    TR         Basic ADL's: OT Bathing;Dressing;Toileting     Advanced  ADL's: OT       Transfers: PT Bed Mobility;Bed to Chair;Car;Furniture;Floor  OT Toilet;Tub/Shower     Locomotion: PT Ambulation;Wheelchair Mobility;Stairs     Additional Impairments: OT    SLP        TR      Anticipated Outcomes Item Anticipated Outcome  Self Feeding Independent  Swallowing      Basic self-care  Supervision  Toileting  Supervision   Bathroom Transfers Supervision  Bowel/Bladder  Continue continent to bowel and bladder.Assess daily for catheter discontinuation.  Transfers  supervision  Locomotion  supervision  Communication     Cognition     Pain  Less than 3 on scale 1 to 10  Safety/Judgment  Pt. will be free of falls during his stay in rehab   Therapy Plan: PT Intensity: Minimum of 1-2 x/day ,45 to 90 minutes PT Frequency: 5 out of 7 days PT Duration Estimated Length of Stay: 10-14 days OT Intensity: Minimum of 1-2 x/day, 45 to 90 minutes OT Frequency: 5 out of 7 days OT Duration/Estimated Length of Stay: 10-14 days         Team Interventions: Nursing Interventions Patient/Family Education;Bladder Management;Bowel Management;Disease Management/Prevention;Pain Management  PT interventions Ambulation/gait training;Balance/vestibular training;Discharge planning;Community reintegration;DME/adaptive equipment instruction;Neuromuscular re-education;Pain management;Functional electrical stimulation;Functional mobility training;Patient/family education;Splinting/orthotics;Therapeutic Exercise;UE/LE  Coordination activities;Therapeutic Activities;Skin care/wound management;Psychosocial support;Stair training;UE/LE Strength taining/ROM;Wheelchair propulsion/positioning  OT Interventions Balance/vestibular training;Discharge planning;Disease Conservator, museum/gallerymangement/prevention;DME/adaptive equipment instruction;Therapeutic Exercise;Therapeutic Activities;UE/LE Coordination activities;Self Care/advanced ADL retraining;Functional mobility training;Patient/family education;Pain management  SLP Interventions    TR Interventions    SW/CM Interventions Psychosocial Support;Patient/Family Education;Discharge Planning    Team Discharge Planning: Destination: PT-Home ,OT- Home , SLP-  Projected Follow-up: PT-Home health PT;24 hour supervision/assistance;Outpatient PT (pending progress), OT-   , SLP-  Projected Equipment Needs: PT-To be determined, OT- To be determined, SLP-  Equipment Details: PT- , OT-  Patient/family involved in discharge planning: PT- Patient,  OT-Patient, SLP-   MD ELOS: 12 days Medical Rehab Prognosis:  Excellent Assessment: The patient has been admitted for CIR therapies. The team will be addressing, functional mobility, strength, stamina, balance, safety, adaptive techniques/equipment, self-care, bowel and bladder mgt, patient and caregiver education, CV tolerance, ego support. Goals have been set at supervision for mobility and self-care. Has supportive wife.    [name], MD, FAAPMR      See Team Conference Notes for weekly updates to the plan of care

## 2013-07-28 NOTE — Evaluation (Signed)
Physical Therapy Assessment and Plan  Patient Details  Name: Mark Joseph MRN: [id number] Date of Birth: Jul 17, 1921  PT Diagnosis: Abnormality of gait, Cognitive deficits, Difficulty walking, Impaired cognition and Muscle weakness Rehab Potential: Good ELOS: 10-14 days   Today's Date: 07/28/2013 Time: 0109-3235 Time Calculation (min): 65 min  Problem List:  Patient Active Problem List   Diagnosis Date Noted  . Physical deconditioning 07/27/2013  . Atrial fibrillation with RVR 07/22/2013  . UTI (urinary tract infection) 10/12/2012  . S/P bilateral inguinal hernia repair 10/03/2012  . Leukocytosis 10/03/2012  . Hematoma 10/03/2012  . CVA (cerebral infarction) 01/30/2012  . Hypertension 01/30/2012    Past Medical History:  Past Medical History  Diagnosis Date  . Hypertension   . GERD (gastroesophageal reflux disease)   . Osteoporosis   . High cholesterol   . Stroke 01/2012    "small; affected his speech; pretty much back to normal now" (09/15/2012)  . Pneumonitis 1971  . Arthritis     "knees; right is worse" (09/15/2012)   Past Surgical History:  Past Surgical History  Procedure Laterality Date  . Appendectomy  1953  . Inguinal hernia repair Bilateral 09/15/2012    w/mesh Archie Endo 09/15/2012  . Inguinal hernia repair Bilateral 09/15/2012    Procedure: HERNIA REPAIR INGUINAL ADULT BILATERAL;  Surgeon: Rolm Bookbinder, MD;  Location: [place];  Service: General;  Laterality: Bilateral;  . Insertion of mesh Bilateral 09/15/2012    Procedure: INSERTION OF MESH;  Surgeon: Rolm Bookbinder, MD;  Location: [facility] OR;  Service: General;  Laterality: Bilateral;    Assessment & Plan Clinical Impression: Mattthew Joseph. is a 80 y.o. 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 Patient was admitted for comprehensive rehabilitation program. Patient transferred to CIR on 07/27/2013 .    Patient currently requires mod (to +2 for safety on eval) with mobility secondary to muscle weakness, decreased awareness, decreased safety awareness and decreased memory and decreased sitting balance, decreased standing balance, decreased balance strategies and difficulty maintaining precautions.  Prior to hospitalization, patient was modified independent  with mobility and lived with Spouse in a House home.  Home access is 2Stairs to enter.  Patient will benefit from skilled PT intervention to maximize safe functional mobility, minimize fall risk and decrease caregiver burden for planned discharge home with 24 hour supervision.  Anticipate patient will benefit from follow up [place] at discharge.  PT - End of Session Endurance Deficit: Yes PT Assessment Rehab Potential: Good Barriers to Discharge: Decreased caregiver support PT Patient demonstrates impairments in the following area(s): Balance;Endurance;Motor;Pain;Safety PT Transfers Functional Problem(s): Bed Mobility;Bed to Chair;Car;Furniture;Floor PT Locomotion Functional Problem(s): Ambulation;Wheelchair Mobility;Stairs PT Plan PT Intensity: Minimum of 1-2 x/day ,45 to 90 minutes PT Frequency: 5 out of 7 days PT Duration Estimated Length of Stay: 10-14 days PT Treatment/Interventions: Ambulation/gait training;Balance/vestibular training;Discharge planning;Community reintegration;DME/adaptive equipment instruction;Neuromuscular re-education;Pain management;Functional electrical stimulation;Functional mobility training;Patient/family education;Splinting/orthotics;Therapeutic Exercise;UE/LE Coordination activities;Therapeutic Activities;Skin care/wound management;Psychosocial support;Stair training;UE/LE Strength taining/ROM;Wheelchair propulsion/positioning PT Transfers Anticipated Outcome(s): supervision PT Locomotion Anticipated Outcome(s): supervision PT Recommendation Follow Up Recommendations: Home health PT;24 hour  supervision/assistance;Outpatient PT (pending progress) Patient destination: Home Equipment Recommended: To be determined  Skilled Therapeutic Intervention  Session focused on standing balance and endurance. W/c propulsion for endurance. Discussed goals of therapy and progression. Stressed importance of calling for assistance prior to transfers.   PT Evaluation Precautions/Restrictions Precautions Precautions: Fall;Back Precaution Comments: Becomes tachy  with activity (per chart review).  Restrictions Weight Bearing Restrictions: No General   Vital SignsTherapy Vitals Pulse Rate: 64 BP: 108/63 mmHg Oxygen Therapy SpO2: 96 % O2 Device: None (Room air) Pain Pain Assessment Pain Assessment: Faces Faces Pain Scale: Hurts even more Pain Location: Knee Pain Orientation: Right Pain Descriptors / Indicators: Aching Pain Onset: With Activity Pain Intervention(s): Repositioned;Rest Home Living/Prior Functioning Home Living Available Help at Discharge: Family;Available 24 hours/day Type of Home: House Home Access: Stairs to enter [employer] of Steps: 2 Entrance Stairs-Rails: None Home Layout: Two level;Able to live on main level with bedroom/bathroom  Lives With: Spouse Prior Function Level of Independence: Requires assistive device for independence (since recent fall began using quad base cane)  Able to Take Stairs?: Yes Comments: Prior to fall apprx 1-2 week ago, pt was independent, driving, going to the [employer] daily  Cognition Arousal/Alertness: Awake/alert Orientation Level: Oriented X4;Appropriate for developmental age Memory: Impaired Memory Impairment: Decreased recall of new information Comments: Decreased awareness of back precautions, difficulty maintaining them in functional activity Sensation Sensation Light Touch: Appears Intact (bil LEs) Proprioception: Appears Intact Coordination Gross Motor Movements are Fluid and Coordinated: Yes Fine Motor  Movements are Fluid and Coordinated: Yes Motor  Motor Motor: Within Functional Limits  Mobility Bed Mobility Bed Mobility: Rolling Left;Left Sidelying to Sit Rolling Left: 4: Min assist;With rail Left Sidelying to Sit: 3: Mod assist;HOB flat Left Sidelying to Sit Details (indicate cue type and reason): Verbal/tactile cues for log roll technique.  Transfers Transfers: Yes Sit to Stand: 3: Mod assist;4: Min assist;From bed;From chair/3-in-1 Sit to Stand Details (indicate cue type and reason): Mod assist initial stand due to posterior lean.  Stand to Sit: 4: Min assist;To chair/3-in-1 Stand to Sit Details: Cues for fully squaring up to chair prrior to sitting down.  Stand Pivot Transfers: 3: Mod assist Stand Pivot Transfer Details (indicate cue type and reason): mod assist without device.  Locomotion  Ambulation Ambulation/Gait Assistance: 1: +2 Total assist;4: Min assist (+2 total without device, min assist with device) Ambulation Distance (Feet): 100 Feet Ambulation/Gait Assistance Details: +2 without device, min assist with RW. Flexed posture throughout, impaired balance with turns.  Gait Gait: Yes Gait Pattern: Impaired Gait Pattern: Step-through pattern;Decreased stride length;Trunk flexed (poor foot clearance bil) Stairs / Additional Locomotion Stairs: Yes Stairs Assistance: 1: +2 Total assist;3: Mod assist Stairs Assistance Details (indicate cue type and reason): +2 for safety, mod assist particulalry for last step d/t Rt knee/thigh pain  Stair Management Technique: Two rails;Forwards Number of Stairs: 2 Architect: Yes Wheelchair Assistance: 4: Energy manager: Both upper extremities;Both lower extermities Wheelchair Parts Management: Needs assistance Distance: 36'  Trunk/Postural Assessment  Cervical Assessment Cervical Assessment: Exceptions to [facility] Cervical AROM Overall Cervical AROM Comments: forward head  posture Thoracic Assessment Thoracic Assessment: Exceptions to [facility] Thoracic Strength Overall Thoracic Strength Comments: kyphotic Lumbar Assessment Lumbar Assessment: Exceptions to [facility] Lumbar Strength Overall Lumbar Strength Comments: posterior pelvic tilt - loss of lumbar curve Postural Control Postural Control: Deficits on evaluation Righting Reactions: impaired on evaluation  Balance Balance Balance Assessed: Yes Static Sitting Balance Static Sitting - Balance Support: Feet supported Static Sitting - Level of Assistance: 3: Mod assist;5: Stand by assistance Static Sitting - Comment/# of Minutes: mod assist initially in bed due to posterior lean, progressed to supervision for rest of session Static Standing Balance Static Standing - Balance Support: Bilateral upper extremity supported Static Standing - Level of Assistance: 4: Min assist Extremity  Assessment      RLE Assessment RLE Assessment: Exceptions to [facility] RLE AROM (degrees) RLE Overall AROM Comments: Decreased knee extension, limited by pain reports h/o "bone spurs" RLE Strength RLE Overall Strength Comments: 3-/5 within range, limited by pain LLE Assessment LLE Assessment: Exceptions to WFL LLE AROM (degrees) LLE Overall AROM Comments: WFL LLE Strength LLE Overall Strength Comments: Generalized deconditioning grossly >/=4-/5  FIM:  FIM - Bed/Chair Transfer Bed/Chair Transfer: 3: Supine > Sit: Mod A (lifting assist/Pt. 50-74%/lift 2 legs;3: Bed > Chair or W/C: Mod A (lift or lower assist) FIM - Locomotion: Wheelchair Distance: 45' Locomotion: Wheelchair: 1: Travels less than 50 ft with minimal assistance (Pt.>75%) FIM - Locomotion: Ambulation Ambulation/Gait Assistance: 1: +2 Total assist;4: Min assist (+2 total without device, min assist with device) Locomotion: Ambulation: 1: Two helpers FIM - Locomotion: Stairs Locomotion: Scientist, physiological: Insurance account manager - 2 Locomotion: Stairs: 1: Two helpers   Refer  to [employer] for Long Term Goals  Recommendations for other services: None  Discharge Criteria: Patient will be discharged from PT if patient refuses treatment 3 consecutive times without medical reason, if treatment goals not met, if there is a change in medical status, if patient makes no progress towards goals or if patient is discharged from hospital.  The above assessment, treatment plan, treatment alternatives and goals were discussed and mutually agreed upon: by patient  Lahoma Rocker 07/28/2013, 11:02 AM

## 2013-07-28 NOTE — Progress Notes (Addendum)
Physical Therapy Session Note  Patient Details  Name: Mark Joseph MRN: [id number] Date of Birth: 05/21/1921  Today's Date: 07/28/2013 Time: [id number] Time Calculation (min): 23 min  Short Term Goals: Week 1:  PT Short Term Goal 1 (Week 1): =LTGS  Skilled Therapeutic Interventions/Progress Updates:    Pt received seated on toilet; agreeable to session. Performed toilet transfer with rolling walker and mod A. Dynamic standing x3 minutes with intermittent LUE support at sink during hand washing with close supervision-min guard for stability. Unable to attempt static standing balance without UE support secondary to increased pain in lumbar spine with single UE support/without UE support. Therefore transitioned to education on role of core musculature in stabilizing lumbar spine during functional mobility. Explained rationale, technique of abdominal bracing in seated then standing. Pt with difficulty breathing while performing abdominal bracing. Departed with pt seated in w/c with quick release belt on for safety, social worker and wife present, and all pt needs within reach.  Therapy Documentation Precautions:  Precautions Precautions: Fall;Back Precaution Booklet Issued: No Precaution Comments: Becomes tachy with activity (per chart review).  Restrictions Weight Bearing Restrictions: No Pain:   4/10 (per FLACC scale) aching surgical pain in distal R aspect of lumbar spine with standing without UE support. Interventions: rest; repositioned.  See FIM for current functional status  Therapy/Group: Individual Therapy  Hobble, Lorenda IshiharaBlair A 07/28/2013, 5:01 PM

## 2013-07-28 NOTE — Progress Notes (Signed)
INITIAL NUTRITION ASSESSMENT  DOCUMENTATION CODES Per approved criteria  -Not Applicable   INTERVENTION: 1.  General healthful diet; encourage intake of foods and beverages as able.  RD to follow and assess for nutritional adequacy.   NUTRITION DIAGNOSIS: Increased nutrient needs related to healing as evidenced by lumbar fx.   Monitor:  1.  Food/Beverage; pt meeting >/=90% estimated needs with tolerance. 2.  Wt/wt change; monitor trends  Reason for Assessment: MST  80 y.o. male  Admitting Dx: deconditioning  ASSESSMENT: Pt admitted with deconditioning r/t recent fall with lumbar fx.   Pt not available at time of visit- in rehab.  Pt reported wt loss on admission.  Recent wt loss of 5-10 lbs in unspecified time frame.  Pt is functional at home.  Currently eating well at 85-100% of meals.  RD to follow for ongoing assessment and interventions as needed.   Height: Ht Readings from Last 1 Encounters:  07/27/13 5\' 11"  (1.803 m)    Weight: Wt Readings from Last 1 Encounters:  07/27/13 147 lb 4.3 oz (66.8 kg)    Ideal Body Weight: 78.1 kg  % Ideal Body Weight: 85%  Wt Readings from Last 10 Encounters:  07/27/13 147 lb 4.3 oz (66.8 kg)  07/27/13 153 lb 7 oz (69.6 kg)  10/28/12 155 lb 6.4 oz (70.489 kg)  10/06/12 155 lb (70.308 kg)  10/03/12 157 lb 10.1 oz (71.5 kg)  09/19/12 154 lb (69.854 kg)  09/16/12 155 lb 9.6 oz (70.58 kg)  09/16/12 155 lb 9.6 oz (70.58 kg)  09/12/12 155 lb 9.6 oz (70.58 kg)  08/30/12 154 lb 9.6 oz (70.126 kg)    Usual Body Weight: 155 lbs  % Usual Body Weight: 94%  BMI:  Body mass index is 20.55 kg/(m^2).  Estimated Nutritional Needs: Kcal: 1850-2000 Protein: 79-92g Fluid: ~2.0 L/day  Skin: no issues noted  Diet Order: Cardiac  EDUCATION NEEDS: -Education not appropriate at this time   Intake/Output Summary (Last 24 hours) at 07/28/13 1436 Last data filed at 07/28/13 1200  Gross per 24 hour  Intake    480 ml  Output   1175  ml  Net   -695 ml    Last BM: 3/20  Labs:   Recent Labs Lab 07/22/13 0923 07/22/13 1821 07/28/13 0500  NA 135*  --  135*  K 4.6  --  4.7  CL 94*  --  97  CO2 28  --  25  BUN 19  --  20  CREATININE 0.86  --  1.05  CALCIUM 9.5  --  8.8  MG  --  1.8  --   GLUCOSE 123*  --  98    CBG (last 3)  No results found for this basename: GLUCAP,  in the last 72 hours  Scheduled Meds: . amiodarone  200 mg Oral BID  . apixaban  5 mg Oral BID  . finasteride  5 mg Oral Daily  . lisinopril  10 mg Oral Daily   And  . hydrochlorothiazide  12.5 mg Oral Daily  . lidocaine  1 patch Transdermal Q24H  . metoprolol tartrate  50 mg Oral BID  . pantoprazole  40 mg Oral Daily  . polyethylene glycol  17 g Oral Daily  . selenium  200 mcg Oral Daily  . simvastatin  10 mg Oral QHS  . tamsulosin  0.4 mg Oral Daily    Continuous Infusions:   Past Medical History  Diagnosis Date  . Hypertension   .  GERD (gastroesophageal reflux disease)   . Osteoporosis   . High cholesterol   . Stroke 01/2012    "small; affected his speech; pretty much back to normal now" (09/15/2012)  . Pneumonitis 1971  . Arthritis     "knees; right is worse" (09/15/2012)    Past Surgical History  Procedure Laterality Date  . Appendectomy  1953  . Inguinal hernia repair Bilateral 09/15/2012    w/mesh Hattie Perch 09/15/2012  . Inguinal hernia repair Bilateral 09/15/2012    Procedure: HERNIA REPAIR INGUINAL ADULT BILATERAL;  Surgeon: Emelia Loron, MD;  Location: [facility] OR;  Service: General;  Laterality: Bilateral;  . Insertion of mesh Bilateral 09/15/2012    Procedure: INSERTION OF MESH;  Surgeon: Emelia Loron, MD;  Location: [facility] OR;  Service: General;  Laterality: Bilateral;    Loyce Dys, MS RD LDN Clinical Inpatient Dietitian Pager: [phone] Weekend/After hours pager: [phone]

## 2013-07-28 NOTE — Progress Notes (Signed)
Social Work  Social Work Assessment and Plan  Patient Details  Name: Mark Joseph MRN: [id number] Date of Birth: 06/05/1921  Today's Date: 07/28/2013  Problem List:  Patient Active Problem List   Diagnosis Date Noted  . Physical deconditioning 07/27/2013  . Atrial fibrillation with RVR 07/22/2013  . UTI (urinary tract infection) 10/12/2012  . S/P bilateral inguinal hernia repair 10/03/2012  . Leukocytosis 10/03/2012  . Hematoma 10/03/2012  . CVA (cerebral infarction) 01/30/2012  . Hypertension 01/30/2012   Past Medical History:  Past Medical History  Diagnosis Date  . Hypertension   . GERD (gastroesophageal reflux disease)   . Osteoporosis   . High cholesterol   . Stroke 01/2012    "small; affected his speech; pretty much back to normal now" (09/15/2012)  . Pneumonitis 1971  . Arthritis     "knees; right is worse" (09/15/2012)   Past Surgical History:  Past Surgical History  Procedure Laterality Date  . Appendectomy  1953  . Inguinal hernia repair Bilateral 09/15/2012    w/mesh Hattie Perch/notes 09/15/2012  . Inguinal hernia repair Bilateral 09/15/2012    Procedure: HERNIA REPAIR INGUINAL ADULT BILATERAL;  Surgeon: Emelia LoronMatthew Wakefield, MD;  [facility];  Service: General;  Laterality: Bilateral;  . Insertion of mesh Bilateral 09/15/2012    Procedure: INSERTION OF MESH;  Surgeon: Emelia LoronMatthew Wakefield, MD;  [facility];  Service: General;  Laterality: Bilateral;   Social History:  reports that he quit smoking about 45 years ago. His smoking use included Cigarettes. He has a 12.5 pack-year smoking history. He has never used smokeless tobacco. He reports that he does not drink alcohol or use illicit drugs.  Family / Support Systems Marital Status: Married Patient Roles: Spouse;Parent Spouse/Significant Other: wife, Louretta ParmaBeverly Bryner @ [phone](H) [phone] or ([phone]) [phone] Children: one dtr, Misty [place]Lisa, [place] Anticipated Caregiver: Louretta ParmaBeverly Logsdon Ability/Limitations of Caregiver: wife unable  to provide physical assist Caregiver Availability: 24/7 Family Dynamics: wife very attentive and supportive, however, does suffer with osteoporosis and cannot provide much physical assist  Social History Preferred language: English Religion: Protestant Cultural Background: NA Education: collete Read: Yes Write: Yes Employment Status: Retired Age Retired: 66 Fish farm managerLegal Hisotry/Current Legal Issues: none Guardian/Conservator: none - per MD, pt capable of making decisions on his own behalf   Abuse/Neglect Physical Abuse: Denies Verbal Abuse: Denies Sexual Abuse: Denies Exploitation of patient/patient's resources: Denies Self-Neglect: Denies  Emotional Status Pt's affect, behavior adn adjustment status: Pt very pleasant and oriented with wife at bedside.  Optimistic about being able to regain strength and return home at mod i level if possible.  Denies any emotional distress. No s/s of anxiety or depression.  Formal screening not indicated at this time. Recent Psychosocial Issues: None Pyschiatric History: None Substance Abuse History: None  Patient / Family Perceptions, Expectations & Goals Pt/Family understanding of illness & functional limitations: pt and wife with good understanding of multiple medical issues leading to current deconditioned state.  Good understanding of current functional limitaitons/ need for CIR Premorbid pt/family roles/activities: pt and wife both very independent and active - enjoying [employer] classes. Anticipated changes in roles/activities/participation: Little change anticipated if reaches mod i goals Pt/family expectations/goals: pt hopeful he can reach mod i goals and eventually return to community level independence/ activity  [employer] Community Agencies: None Premorbid Home Care/DME Agencies: None Transportation available at discharge: yes  Discharge Planning Living Arrangements: Spouse/significant other Support Systems: Spouse/significant  other;Children;Other relatives Type of Residence: Private residence Insurance Resources: Medicare ((**[employer]))  Financial Resources: Restaurant manager, fast food Screen Referred: No Living Expenses: Own Money Management: Patient Does the patient have any problems obtaining your medications?: No Home Management: shared between pt and wife Patient/Family Preliminary Plans: pt to return home with wife providing supervision as needed Social Work Anticipated Follow Up Needs: HH/OP Expected length of stay: 7-10 days  Clinical Impression Very pleasant, oriented and motivated gentleman here following recent L2 fx, new finding of cardiac issues and general deconditioning.  Wife very supportive, however, elderly and with osteoporosis which limits how much physical assist she could provide.  Goals set at mod i and short LOS expected.  No emotional distress currently - will monitor.  Donya Tomaro 07/28/2013, 10:53 AM

## 2013-07-28 NOTE — Progress Notes (Signed)
         Subjective/Complaints:   Objective: Vital Signs: Blood pressure 132/66, pulse 64, temperature 97.8 F (36.6 C), temperature source Oral, resp. rate 18, height 5\' 11"  (1.803 m), weight 66.8 kg (147 lb 4.3 oz), SpO2 100.00%. No results found.  Recent Labs  07/28/13 0500  WBC 7.2  HGB 12.0*  HCT 33.3*  PLT 340    Recent Labs  07/28/13 0500  NA 135*  K 4.7  CL 97  GLUCOSE 98  BUN 20  CREATININE 1.05  CALCIUM 8.8   CBG (last 3)  No results found for this basename: GLUCAP,  in the last 72 hours  Wt Readings from Last 3 Encounters:  07/27/13 66.8 kg (147 lb 4.3 oz)  07/27/13 69.6 kg (153 lb 7 oz)  10/28/12 70.489 kg (155 lb 6.4 oz)    Physical Exam:  Constitutional: He is oriented to person, place, and time.  Frail appearing  HENT:  Head: Normocephalic.  Eyes: EOM are normal.  Neck: Normal range of motion. Neck supple. No thyromegaly present.  Cardiovascular:  Cardiac rate is controlled and regular. No murmurs Respiratory: Effort normal and breath sounds normal. No respiratory distress.  GI: Soft. Bowel sounds are normal. He exhibits no distension.  Genitourinary:  Neurological: He is alert and oriented to person, place, and time. HOH Patient pleasant and cooperative with exam. He followed commands. A little impulsive. Strength 4/5 in BUE and 4- to 4 BLE. Sensory exam grossly intact. Fair insight and awareness.  Musc: slight pain with palpation right thigh--no visible abnormalities. Has muscle wasting BLE Skin: Skin is warm and dry    Assessment/Plan: 1. Functional deficits secondary to deconditioning related to multiple medical issues which require 3+ hours per day of interdisciplinary therapy in a comprehensive inpatient rehab setting. Physiatrist is providing close team supervision and 24 hour management of active medical problems listed below. Physiatrist and rehab team continue to assess barriers to discharge/monitor patient progress toward  functional and medical goals. FIM:                                  Medical Problem List and Plan:  1. Deconditioning after multi-medical recent L2 fracture  2. DVT Prophylaxis/Anticoagulation: Patient on Eliquis. Monitor for signs of DVT  3. Pain Management: Lidoderm patch, Tylenol. Monitor the increased mobility   -add sports cream for right thigh---consider xr if persistent 4. Neuropsych: This patient is capable of making decisions on his own behalf.  5. Atrial fibrillation/incessant tachycardia. Continue amiodarone as well as Lopressor. Followup cardiology services  6. Hypertension. Continue with lisinopril as well as hydrochlorothiazide with Lopressor 50 mg twice a day. Monitor with increased mobility in  7. History of urinary retention. Continue Flomax and proscar. Voiding trial  8. Hyperlipidemia. Zocor   LOS (Days) 1 A FACE TO FACE EVALUATION WAS PERFORMED  Chrisopher Pustejovsky T 07/28/2013 8:35 AM

## 2013-07-29 ENCOUNTER — Inpatient Hospital Stay (HOSPITAL_COMMUNITY): Payer: Medicare Other | Admitting: *Deleted

## 2013-07-29 DIAGNOSIS — W19XXXA Unspecified fall, initial encounter: Secondary | ICD-10-CM

## 2013-07-29 DIAGNOSIS — S32009A Unspecified fracture of unspecified lumbar vertebra, initial encounter for closed fracture: Secondary | ICD-10-CM

## 2013-07-29 DIAGNOSIS — R5381 Other malaise: Secondary | ICD-10-CM

## 2013-07-29 MED ORDER — LIDOCAINE 5 % EX PTCH: 1.0000 | MEDICATED_PATCH | CUTANEOUS | Status: DC

## 2013-07-29 MED ORDER — LIDOCAINE 5 % EX PTCH
1.0000 | MEDICATED_PATCH | CUTANEOUS | Status: DC
  Administered 2013-07-29 – 2013-08-08 (×11): 1 via TRANSDERMAL
  Filled 2013-07-29 (×12): qty 1

## 2013-07-29 NOTE — Progress Notes (Signed)
Subjective/Complaints: No complaints. Up with tech at toilet. Denies pain. A 12 point review of systems has been performed and if not noted above is otherwise negative.   Objective: Vital Signs: Blood pressure 164/71, pulse 64, temperature 97.8 F (36.6 C), temperature source Oral, resp. rate 16, height 5\' 11"  (1.803 m), weight 69.2 kg (152 lb 8.9 oz), SpO2 95.00%. No results found.  Recent Labs  07/28/13 0500  WBC 7.2  HGB 12.0*  HCT 33.3*  PLT 340    Recent Labs  07/28/13 0500  NA 135*  K 4.7  CL 97  GLUCOSE 98  BUN 20  CREATININE 1.05  CALCIUM 8.8   CBG (last 3)  No results found for this basename: GLUCAP,  in the last 72 hours  Wt Readings from Last 3 Encounters:  07/29/13 69.2 kg (152 lb 8.9 oz)  07/27/13 69.6 kg (153 lb 7 oz)  10/28/12 70.489 kg (155 lb 6.4 oz)    Physical Exam:  Constitutional: He is oriented to person, place, and time.  Frail appearing  HENT:  Head: Normocephalic.  Eyes: EOM are normal.  Neck: Normal range of motion. Neck supple. No thyromegaly present.  Cardiovascular:  Cardiac rate is controlled and regular. No murmurs Respiratory: Effort normal and breath sounds normal. No respiratory distress.  GI: Soft. Bowel sounds are normal. He exhibits no distension.  Genitourinary:  Neurological: He is alert and oriented to person, place, and time. HOH Patient pleasant and cooperative with exam. He followed commands. A little impulsive. Strength 4/5 in BUE and 4- to 4 BLE. Sensory exam grossly intact. Fair insight and awareness.  Musc: slight pain with palpation right thigh--no visible abnormalities. Has muscle wasting BLE Skin: Skin is warm and dry    Assessment/Plan: 1. Functional deficits secondary to deconditioning related to multiple medical issues which require 3+ hours per day of interdisciplinary therapy in a comprehensive inpatient rehab setting. Physiatrist is providing close team supervision and 24 hour management  of active medical problems listed below. Physiatrist and rehab team continue to assess barriers to discharge/monitor patient progress toward functional and medical goals. FIM: FIM - Bathing Bathing Steps Patient Completed: Chest;Right Arm;Left Arm;Abdomen;Front perineal area;Right upper leg;Left upper leg Bathing: 3: Mod-Patient completes 5-7 32f 10 parts or 50-74%  FIM - Upper Body Dressing/Undressing Upper body dressing/undressing steps patient completed: Thread/unthread right sleeve of pullover shirt/dresss;Thread/unthread left sleeve of pullover shirt/dress;Put head through opening of pull over shirt/dress;Pull shirt over trunk;Thread/unthread right sleeve of front closure shirt/dress;Thread/unthread left sleeve of front closure shirt/dress;Pull shirt around back of front closure shirt/dress;Button/unbutton shirt Upper body dressing/undressing: 4: Min-Patient completed 75 plus % of tasks FIM - Lower Body Dressing/Undressing Lower body dressing/undressing: 1: Total-Patient completed less than 25% of tasks     FIM - Diplomatic Services operational officer Devices: Art gallery manager Transfers: 3-From toilet/BSC: Mod A (lift or lower assist)  FIM - Banker Devices: Therapist, occupational: 4: Bed > Chair or W/C: Min A (steadying Pt. > 75%)  FIM - Locomotion: Wheelchair Distance: 45' Locomotion: Wheelchair: 1: Travels less than 50 ft with minimal assistance (Pt.>75%) FIM - Locomotion: Ambulation Ambulation/Gait Assistance: 1: +2 Total assist;4: Min assist (+2 total without device, min assist with device) Locomotion: Ambulation: 1: Two helpers  Comprehension Comprehension Mode: Auditory Comprehension: 4-Understands basic 75 - 89% of the time/requires cueing 10 - 24% of the time  Expression Expression Mode: Verbal Expression: 4-Expresses basic 75 - 89% of the  time/requires cueing 10 - 24% of the time. Needs helper to occlude trach/needs to  repeat words.  Social Interaction Social Interaction: 5-Interacts appropriately 90% of the time - Needs monitoring or encouragement for participation or interaction.  Problem Solving Problem Solving: 4-Solves basic 75 - 89% of the time/requires cueing 10 - 24% of the time  Memory Memory: 5-Recognizes or recalls 90% of the time/requires cueing < 10% of the time  Medical Problem List and Plan:  1. Deconditioning after multi-medical recent L2 fracture  2. DVT Prophylaxis/Anticoagulation: Patient on Eliquis. Monitor for signs of DVT  3. Pain Management: Lidoderm patch, Tylenol. Monitor the increased mobility   - sports cream for right thigh---consider xr if persistent 4. Neuropsych: This patient is capable of making decisions on his own behalf.  5. Atrial fibrillation/incessant tachycardia. Continue amiodarone as well as Lopressor. Followup cardiology services  6. Hypertension. Continue with lisinopril as well as hydrochlorothiazide with Lopressor 50 mg twice a day. Monitor with increased mobility in  7. History of urinary retention. Continue Flomax and proscar. Voiding trial today  8. Hyperlipidemia. Zocor   LOS (Days) 2 A FACE TO FACE EVALUATION WAS PERFORMED  [place]ow T 07/29/2013 8:09 AM

## 2013-07-30 ENCOUNTER — Inpatient Hospital Stay (HOSPITAL_COMMUNITY): Payer: Medicare Other | Admitting: Physical Therapy

## 2013-07-30 ENCOUNTER — Inpatient Hospital Stay (HOSPITAL_COMMUNITY): Payer: Medicare Other

## 2013-07-30 LAB — URINALYSIS, ROUTINE W REFLEX MICROSCOPIC
BILIRUBIN URINE: NEGATIVE
GLUCOSE, UA: NEGATIVE mg/dL
Ketones, ur: NEGATIVE mg/dL
Nitrite: NEGATIVE
Protein, ur: NEGATIVE mg/dL
SPECIFIC GRAVITY, URINE: 1.015 (ref 1.005–1.030)
Urobilinogen, UA: 0.2 mg/dL (ref 0.0–1.0)
pH: 7 (ref 5.0–8.0)

## 2013-07-30 LAB — URINE MICROSCOPIC-ADD ON

## 2013-07-30 NOTE — Progress Notes (Signed)
Physical Therapy Session Note  Patient Details  Name: Ashok CroonLeon A Bailly MRN: [id number] Date of Birth: 08/11/1921  Today's Date: 07/30/2013 Time: 0800-0900 Time Calculation (min): 60 min  Short Term Goals: Week 1:  PT Short Term Goal 1 (Week 1): =LTGS  Skilled Therapeutic Interventions/Progress Updates:  Pt was seen bedside in the am. Pt transferred supine to edge of bed with side rail, head of bed elevated and S. Pt transferred edge of bed to w/c with min A. Pt propelled w/c to gym with B LEs about 200 feet with S and verbal cues. Pt transferred sit to stand from w/c with rolling walker and min guard multiple times. Pt ambulated with rolling walker x 2 for 150 feet each time with min guard to min A. Pt performed cone taps, criss cross cone taps and alternating cone taps for LE strengthening. Pt propelled w/c back to room with B LEs and S.    Therapy Documentation Precautions:  Precautions Precautions: Fall;Back Precaution Booklet Issued: No Precaution Comments: Becomes tachy with activity (per chart review).  Restrictions Weight Bearing Restrictions: No General:   Pain: Pt c/o 2-3/10 low back pain.    Locomotion : Ambulation Ambulation/Gait Assistance: 4: Min guard;4: Min assist   See FIM for current functional status  Therapy/Group: Individual Therapy  Rayford HalstedMitchell, Zakk Borgen G 07/30/2013, 12:28 PM

## 2013-07-30 NOTE — Progress Notes (Signed)
Physical Therapy Session Note  Patient Details  Name: Mark Joseph MRN: [id number] Date of Birth: 08/10/1921  Today's Date: 07/30/2013 Time: [id number] Time Calculation (min): 42 min  Short Term Goals: Week 1:  PT Short Term Goal 1 (Week 1): =LTGS  Skilled Therapeutic Interventions/Progress Updates:  Pt was seen bedside in the pm. Pt ambulated 200 feet with rolling walker and S to min guard. Pt performed multiple sit to stand with rolling and S from w/c. Pt performed mini squats 3 sets x 10 reps each. Pt rode Nu-step x 10 minutes at level 2 with 4 rest breaks. Pt propelled w/c back to room with B LEs and S. Pt transferred w/c to recliner with min A without assistive device.    Therapy Documentation Precautions:  Precautions Precautions: Fall;Back Precaution Booklet Issued: No Precaution Comments: Becomes tachy with activity (per chart review).  Restrictions Weight Bearing Restrictions: No General:   Pain: Pt c/o mild B LE pain R > L   Locomotion : Ambulation Ambulation/Gait Assistance: 4: Min guard   See FIM for current functional status  Therapy/Group: Individual Therapy  Rayford HalstedMitchell, Kirandeep Fariss G 07/30/2013, 3:30 PM

## 2013-07-30 NOTE — Progress Notes (Signed)
Complained of lower back pain, PRN tylenol given at 2322 and 0330. Voided this AM, PVR=450, I & O cath=525. Mark MartinezMurray, Lupe Handley A

## 2013-07-30 NOTE — Progress Notes (Signed)
Subjective/Complaints: Pain seems better in right thigh. . A 12 point review of systems has been performed and if not noted above is otherwise negative.   Objective: Vital Signs: Blood pressure 142/73, pulse 56, temperature 98.1 F (36.7 C), temperature source Oral, resp. rate 18, height 5\' 11"  (1.803 m), weight 67.6 kg (149 lb 0.5 oz), SpO2 95.00%. No results found.  Recent Labs  07/28/13 0500  WBC 7.2  HGB 12.0*  HCT 33.3*  PLT 340    Recent Labs  07/28/13 0500  NA 135*  K 4.7  CL 97  GLUCOSE 98  BUN 20  CREATININE 1.05  CALCIUM 8.8   CBG (last 3)  No results found for this basename: GLUCAP,  in the last 72 hours  Wt Readings from Last 3 Encounters:  07/30/13 67.6 kg (149 lb 0.5 oz)  07/27/13 69.6 kg (153 lb 7 oz)  10/28/12 70.489 kg (155 lb 6.4 oz)    Physical Exam:  Constitutional: He is oriented to person, place, and time.  Frail appearing  HENT:  Head: Normocephalic.  Eyes: EOM are normal.  Neck: Normal range of motion. Neck supple. No thyromegaly present.  Cardiovascular:  Cardiac rate is controlled and regular. No murmurs Respiratory: Effort normal and breath sounds normal. No respiratory distress.  GI: Soft. Bowel sounds are normal. He exhibits no distension.  Genitourinary:  Neurological: He is alert and oriented to person, place, and time. HOH Patient pleasant and cooperative with exam. He followed commands. A little impulsive. Strength 4/5 in BUE and 4- to 4 BLE. Sensory exam grossly intact. Fair insight and awareness.  Musc: minimal pain with palpation right thigh-  Has muscle wasting BLE Skin: Skin is warm and dry    Assessment/Plan: 1. Functional deficits secondary to deconditioning related to multiple medical issues which require 3+ hours per day of interdisciplinary therapy in a comprehensive inpatient rehab setting. Physiatrist is providing close team supervision and 24 hour management of active medical problems listed  below. Physiatrist and rehab team continue to assess barriers to discharge/monitor patient progress toward functional and medical goals. FIM: FIM - Bathing Bathing Steps Patient Completed: Chest;Right Arm;Left Arm;Abdomen;Front perineal area;Right upper leg;Left upper leg Bathing: 3: Mod-Patient completes 5-7 8360f 10 parts or 50-74%  FIM - Upper Body Dressing/Undressing Upper body dressing/undressing steps patient completed: Thread/unthread right sleeve of pullover shirt/dresss;Thread/unthread left sleeve of pullover shirt/dress;Put head through opening of pull over shirt/dress;Pull shirt over trunk;Thread/unthread right sleeve of front closure shirt/dress;Thread/unthread left sleeve of front closure shirt/dress;Pull shirt around back of front closure shirt/dress;Button/unbutton shirt Upper body dressing/undressing: 6: More than reasonable amount of time FIM - Lower Body Dressing/Undressing Lower body dressing/undressing: 1: Total-Patient completed less than 25% of tasks  FIM - Toileting Toileting steps completed by patient: Performs perineal hygiene Toileting Assistive Devices: Grab bar or rail for support Toileting: 2: Max-Patient completed 1 of 3 steps  FIM - Diplomatic Services operational officerToilet Transfers Toilet Transfers Assistive Devices: Grab bars;Walker Toilet Transfers: 4-To toilet/BSC: Min A (steadying Pt. > 75%);4-From toilet/BSC: Min A (steadying Pt. > 75%)  FIM - Bed/Chair Transfer Bed/Chair Transfer Assistive Devices: Therapist, occupationalWalker Bed/Chair Transfer: 4: Bed > Chair or W/C: Min A (steadying Pt. > 75%)  FIM - Locomotion: Wheelchair Distance: 45' Locomotion: Wheelchair: 1: Travels less than 50 ft with minimal assistance (Pt.>75%) FIM - Locomotion: Ambulation Ambulation/Gait Assistance: 1: +2 Total assist;4: Min assist (+2 total without device, min assist with device) Locomotion: Ambulation: 1: Two helpers  Comprehension Comprehension Mode:  Auditory Comprehension: 4-Understands basic 75 - 89% of the  time/requires cueing 10 - 24% of the time  Expression Expression Mode: Verbal Expression: 4-Expresses basic 75 - 89% of the time/requires cueing 10 - 24% of the time. Needs helper to occlude trach/needs to repeat words.  Social Interaction Social Interaction: 5-Interacts appropriately 90% of the time - Needs monitoring or encouragement for participation or interaction.  Problem Solving Problem Solving: 4-Solves basic 75 - 89% of the time/requires cueing 10 - 24% of the time  Memory Memory: 5-Recognizes or recalls 90% of the time/requires cueing < 10% of the time  Medical Problem List and Plan:  1. Deconditioning after multi-medical recent L2 fracture  2. DVT Prophylaxis/Anticoagulation: Patient on Eliquis. Monitor for signs of DVT  3. Pain Management: Lidoderm patch, Tylenol. Monitor the increased mobility   - sports cream for right thigh---consider xr if persistent 4. Neuropsych: This patient is capable of making decisions on his own behalf.  5. Atrial fibrillation/incessant tachycardia. Continue amiodarone as well as Lopressor. Followup cardiology services  6. Hypertension. Continue with lisinopril as well as hydrochlorothiazide with Lopressor 50 mg twice a day. Monitor with increased mobility in  7. History of urinary retention. Continue Flomax and proscar. Voiding trial with variable pvr's-  -continue OOB to void, double voids  -check ua and cx 8. Hyperlipidemia. Zocor   LOS (Days) 3 A FACE TO FACE EVALUATION WAS PERFORMED  Ramere Downs T 07/30/2013 8:14 AM

## 2013-07-30 NOTE — Progress Notes (Signed)
Occupational Therapy Session Note  Patient Details  Name: Mark Joseph MRN: [id number] Date of Birth: 07/08/1921  Today's Date: 07/30/2013 Time: [id number] Time Calculation (min): 45 min  Short Term Goals: Week 1:  OT Short Term Goal 1 (Week 1): Patient will complete toileiting with steadying assist (min a) to maintain standing balance OT Short Term Goal 2 (Week 1): Patient will complete transfers on/off BSC with min assist using LRAD OT Short Term Goal 3 (Week 1): Patient will demonstrate improved endurance as evidenced by completeing 60 miin ADL, sitting and standing, with 1 rest break OT Short Term Goal 4 (Week 1): Patient will demonstrate ability to complete B-UE HEP with supervision  Skilled Therapeutic Interventions/Progress Updates: ADL-retraining with focus on sit >< stand, dynamic standing balance, functional transfers and lower body dressing skills.   Patient received seated in his recliner, safety belt fastened, awaiting planned ADL (bathing and dressing).   With close supervision and verbal cues to improve postural control while ambulating, patient completed transfers and mobility with steadying assist/min guard and performed bathing, sitting and standing using shower chair and grab bars, as needed.   Patient required only 1 reminder to use RW while exiting shower this session and he returned to recliner to complete dressing sitting/standing at RW with only min assist to tuck in his shirt at the back.   Although attempting task as instructed, patient was unable to tolerate standing at sink to brush his teeth due to pain and mild fatigue and instead elected to groom while seated in w/c for improved pain management.   Patient left in room with his wife present and call light within reach.   Therapy Documentation Precautions:  Precautions Precautions: Fall;Back Precaution Booklet Issued: No Precaution Comments: Becomes tachy with activity (per chart review).  Restrictions Weight  Bearing Restrictions: No  Pain: Pain Assessment Pain Assessment: No/denies pain  ADL: ADL ADL Comments: see FIM  See FIM for current functional status  Therapy/Group: Individual Therapy  Second Session Time: 1400-1445 Time Calculation (min):  45 min  Pain Assessment: No pain  Skilled Therapeutic Interventions: W/C management X 15 minutes during functional mobility in room and to gym.  Patient required change with footrest to improve performance with seated mobility as he was too fatigued to attempt use of RW during planned therapeutic activity.   Therapeutic activity X 30 min with focus on UE strengthening, endurance and functional transfers.   Patient completed 15 minutes using SciFit, level 4.0, without need for rest break during this session.   Patient returned to his room and completed transfer from w/c to recliner.   During transfer practice, (bed, recliner, w/c) patient continues to require min guard assist and verbal cues to reach and lower himself from a closer distance to destination (d/t misjudging distances) and to maintain improved posture while standing at RW.   See FIM for current functional status  Therapy/Group: Individual Therapy  Berley Gambrell 07/30/2013, 12:30 PM

## 2013-07-31 ENCOUNTER — Inpatient Hospital Stay (HOSPITAL_COMMUNITY): Payer: Medicare Other

## 2013-07-31 ENCOUNTER — Inpatient Hospital Stay (HOSPITAL_COMMUNITY): Payer: Medicare Other | Admitting: Physical Therapy

## 2013-07-31 DIAGNOSIS — W19XXXA Unspecified fall, initial encounter: Secondary | ICD-10-CM

## 2013-07-31 DIAGNOSIS — S32009A Unspecified fracture of unspecified lumbar vertebra, initial encounter for closed fracture: Secondary | ICD-10-CM

## 2013-07-31 DIAGNOSIS — R5381 Other malaise: Secondary | ICD-10-CM

## 2013-07-31 LAB — URINE CULTURE
COLONY COUNT: NO GROWTH
Culture: NO GROWTH

## 2013-07-31 MED ORDER — LIDOCAINE HCL 2 % EX GEL
CUTANEOUS | Status: DC | PRN
  Filled 2013-07-31: qty 5

## 2013-07-31 MED ORDER — TAMSULOSIN HCL 0.4 MG PO CAPS
0.4000 mg | ORAL_CAPSULE | Freq: Two times a day (BID) | ORAL | Status: DC
  Administered 2013-07-31 – 2013-08-07 (×15): 0.4 mg via ORAL
  Filled 2013-07-31 (×17): qty 1

## 2013-07-31 NOTE — Progress Notes (Signed)
Occupational Therapy Session Note  Patient Details  Name: Mark CroonLeon A Wiehe MRN: [id number] Date of Birth: 09/17/1921  Today's Date: 07/31/2013 Time: 1015-1100 Time Calculation (min): 45 min  Short Term Goals: Week 1:  OT Short Term Goal 1 (Week 1): Patient will complete toileiting with steadying assist (min a) to maintain standing balance OT Short Term Goal 2 (Week 1): Patient will complete transfers on/off BSC with min assist using LRAD OT Short Term Goal 3 (Week 1): Patient will demonstrate improved endurance as evidenced by completeing 60 miin ADL, sitting and standing, with 1 rest break OT Short Term Goal 4 (Week 1): Patient will demonstrate ability to complete B-UE HEP with supervision  Skilled Therapeutic Interventions/Progress Updates:  ADL-retraining with focus on endurance, transfers, and safety awareness.   Patient received seated in recliner, safety belt fastened.   Patient required setup assist and verbal cues for efficient transfer and preparation for planned bath.   Patient demo's mild confusion during preparation for task and requests continuous feedback/guidance on task performance and details.   Close supervision is required during transfers and mobility d/t instability with use of RW and incoordination while managing device and approaching shower chair, recliner or bed.   Patient performs seated bathing thoroughly but requires repeated cues to use grab bar while standing and to manage shower wand and bathing supplies.   Patient returned to edge of bed to dress and remains requires steadying assist while standing to don underwear and pants, with verbal cues to sequence.   OT demo'd lateral leans as option to standing to dress during this session.   Patient completed transfer to w/c with contact guard and was left in room with his wife present while awaiting next scheduled therapy session.    Therapy Documentation Precautions:  Precautions Precautions: Fall;Back Precaution Booklet  Issued: No Precaution Comments: Becomes tachy with activity (per chart review).  Restrictions Weight Bearing Restrictions: No  Vital Signs: Therapy Vitals Pulse Rate: 62 BP: 120/55 mmHg  Pain: Pain Assessment Pain Assessment: 0-10 Pain Score: 2  Pain Type: Acute pain Pain Location: Back Pain Orientation: Lower Pain Descriptors / Indicators: Aching (when sitting) Pain Frequency: Intermittent Pain Onset: Gradual Patients Stated Pain Goal: 2 Pain Intervention(s): Medication (See eMAR)  ADL: ADL ADL Comments: see FIM  See FIM for current functional status  Therapy/Group: Individual Therapy  Second session: Time: 1430-1500 Time Calculation (min):  30 min  Pain Assessment: 4/10, lower back  Skilled Therapeutic Interventions: Therapeutic activity with focus on dynamic standing balance and endurance.  Patient was educated on planned standing balance screen/assessment and activity using Nintendo Wii w/balance board.  With setup assist to place walker in front of board patient completed initial trial of balance board with min guard for safety.   During center-of-balance test, patient was unable to maintain static supported (RW) balance sufficient for calibration of device to occur (error codes generated 3 separate times).   Device was re-tested by therapist successfully after patient failures.  Spouse confirmed that patient has had difficulty PTA maintain relaxed stance when he assists her with ascending/descending stairs.   OT advised termination of trial and change of activity to seated upper arm ergometry using SciFit, as patient was familiar with SciFit and reported good response from last session.   Patient transferred to SciFit but was unable to tolerate more than 5 minutes, with 1 rest break at 2:40 seconds, due to pain in back and weakness at right upper leg.   OT advised termination of session and  contact with RN d/t pain.   Patient left with RN attending him in his room.  See  FIM for current functional status  Therapy/Group: Individual Therapy  Moorea Boissonneault 07/31/2013, 12:16 PM

## 2013-07-31 NOTE — Progress Notes (Signed)
Subjective/Complaints: Complains of urinary frequency---needing to be cathed at times, some blood a times. Otherwise feeling well. . A 12 point review of systems has been performed and if not noted above is otherwise negative.   Objective: Vital Signs: Blood pressure 145/84, pulse 61, temperature 98.1 F (36.7 C), temperature source Oral, resp. rate 17, height 5\' 11"  (1.803 m), weight 69.1 kg (152 lb 5.4 oz), SpO2 100.00%. No results found. No results found for this basename: WBC, HGB, HCT, PLT,  in the last 72 hours No results found for this basename: NA, K, CL, CO, GLUCOSE, BUN, CREATININE, CALCIUM,  in the last 72 hours CBG (last 3)  No results found for this basename: GLUCAP,  in the last 72 hours  Wt Readings from Last 3 Encounters:  07/31/13 69.1 kg (152 lb 5.4 oz)  07/27/13 69.6 kg (153 lb 7 oz)  10/28/12 70.489 kg (155 lb 6.4 oz)    Physical Exam:  Constitutional: He is oriented to person, place, and time.  Frail appearing  HENT:  Head: Normocephalic.  Eyes: EOM are normal.  Neck: Normal range of motion. Neck supple. No thyromegaly present.  Cardiovascular:  Cardiac rate is controlled and regular. No murmurs Respiratory: Effort normal and breath sounds normal. No respiratory distress.  GI: Soft. Bowel sounds are normal. He exhibits no distension.  Genitourinary:  Neurological: He is alert and oriented to person, place, and time. HOH Patient pleasant and cooperative with exam. He followed commands. A little impulsive. Strength 4/5 in BUE and 4- to 4 BLE. Sensory exam grossly intact. Fair insight and awareness.  Musc: minimal pain with palpation right thigh-  Has muscle wasting BLE Skin: Skin is warm and dry    Assessment/Plan: 1. Functional deficits secondary to deconditioning related to multiple medical issues which require 3+ hours per day of interdisciplinary therapy in a comprehensive inpatient rehab setting. Physiatrist is providing close team  supervision and 24 hour management of active medical problems listed below. Physiatrist and rehab team continue to assess barriers to discharge/monitor patient progress toward functional and medical goals. FIM: FIM - Bathing Bathing Steps Patient Completed: Chest;Right Arm;Left Arm;Abdomen;Front perineal area;Buttocks;Right upper leg;Left upper leg;Right lower leg (including foot);Left lower leg (including foot) Bathing: 5: Supervision: Safety issues/verbal cues  FIM - Upper Body Dressing/Undressing Upper body dressing/undressing steps patient completed: Thread/unthread right sleeve of front closure shirt/dress;Thread/unthread left sleeve of front closure shirt/dress;Thread/unthread right sleeve of pullover shirt/dresss;Thread/unthread left sleeve of pullover shirt/dress;Put head through opening of pull over shirt/dress;Pull shirt over trunk;Button/unbutton shirt Upper body dressing/undressing: 4: Min-Patient completed 75 plus % of tasks FIM - Lower Body Dressing/Undressing Lower body dressing/undressing steps patient completed: Thread/unthread right underwear leg;Thread/unthread left underwear leg;Pull underwear up/down;Thread/unthread right pants leg;Thread/unthread left pants leg;Pull pants up/down;Fasten/unfasten pants;Don/Doff right sock;Don/Doff left sock;Don/Doff right shoe;Don/Doff left shoe;Fasten/unfasten right shoe;Fasten/unfasten left shoe Lower body dressing/undressing: 5: Supervision: Safety issues/verbal cues  FIM - Toileting Toileting steps completed by patient: Performs perineal hygiene Toileting Assistive Devices: Grab bar or rail for support Toileting: 2: Max-Patient completed 1 of 3 steps  FIM - Diplomatic Services operational officerToilet Transfers Toilet Transfers Assistive Devices: Grab bars;Walker Toilet Transfers: 4-To toilet/BSC: Min A (steadying Pt. > 75%);4-From toilet/BSC: Min A (steadying Pt. > 75%)  FIM - Bed/Chair Transfer Bed/Chair Transfer Assistive Devices: Bed rails;HOB  elevated;Walker Bed/Chair Transfer: 5: Supine > Sit: Supervision (verbal cues/safety issues);4: Bed > Chair or W/C: Min A (steadying Pt. > 75%)  FIM - Locomotion: Wheelchair Distance: 45' Locomotion: Wheelchair: 5:  Travels 150 ft or more: maneuvers on rugs and over door sills with supervision, cueing or coaxing FIM - Locomotion: Ambulation Locomotion: Ambulation Assistive Devices: Walker - Rolling Ambulation/Gait Assistance: 4: Min guard Locomotion: Ambulation: 4: Travels 150 ft or more with minimal assistance (Pt.>75%)  Comprehension Comprehension Mode: Auditory Comprehension: 4-Understands basic 75 - 89% of the time/requires cueing 10 - 24% of the time  Expression Expression Mode: Verbal Expression: 4-Expresses basic 75 - 89% of the time/requires cueing 10 - 24% of the time. Needs helper to occlude trach/needs to repeat words.  Social Interaction Social Interaction: 5-Interacts appropriately 90% of the time - Needs monitoring or encouragement for participation or interaction.  Problem Solving Problem Solving: 4-Solves basic 75 - 89% of the time/requires cueing 10 - 24% of the time  Memory Memory: 5-Recognizes or recalls 90% of the time/requires cueing < 10% of the time  Medical Problem List and Plan:  1. Deconditioning after multi-medical recent L2 fracture  2. DVT Prophylaxis/Anticoagulation: Patient on Eliquis. Monitor for signs of DVT  3. Pain Management: Lidoderm patch, Tylenol. Monitor the increased mobility   - sports cream for right thigh---consider xr if persistent 4. Neuropsych: This patient is capable of making decisions on his own behalf.  5. Atrial fibrillation/incessant tachycardia. Continue amiodarone as well as Lopressor. Followup cardiology services  6. Hypertension. Continue with lisinopril as well as hydrochlorothiazide with Lopressor 50 mg twice a day. Monitor with increased mobility in  7. History of urinary retention. Continue Flomax and proscar. pvr's  increased so far  -increase flomax to bid bp permitting  -continue OOB to void, double voids  -ua neg, cx pending 8. Hyperlipidemia. Zocor   LOS (Days) 4 A FACE TO FACE EVALUATION WAS PERFORMED  Tymeir Weathington T 07/31/2013 7:45 AM

## 2013-07-31 NOTE — Progress Notes (Signed)
Physical Therapy Note  Patient Details  Name: Mark Joseph MRN: [id number] Date of Birth: 08/09/1921 Today's Date: 07/31/2013  1100 -1155 (55 minutes) individual Pain: no reported pain Focus of treatment: gait training/ activity tolerance; wc mobility training/ activity tolerance; therapeutic exercise focused on bilateral LE strengthening/ activity tolerance Treatment: Pt in wc upon arrival;gait 50 feet X 1 before c/o SOB (needed to sit immediately / Oxygen sats 93% RA  pulse 58 BPM); Nustep Level 3 ( 3 minutes X 2 with one rest break, Oxygen sats > 92% pulse 58-60); attempted to use rollator secondary decreased endurance but pt unable to use safely and required max vcs for locking brakes X 50 feet before sitting; during stand turn transfers pt required max vcs for hand placement during transfer ; returned to room with quick release belt in place; wife present.    Sonda Coppens,JIM 07/31/2013, 11:56 AM

## 2013-07-31 NOTE — Progress Notes (Signed)
Per previous RN report, patient bled after I & O cath at 1400. Patient reports bleeding with each void. At 1945, voided in toilet, PVR = 355. At 2155 no void, bladder scan= 374, pt. requesting not to be cathed. At 0400, voided and continent BM. Blood also noted in underwear. No I & O caths since 1400 cath, will speak with Doctor in AM. Dayton ScrapeMurray, Jodelle GreenJamie A

## 2013-07-31 NOTE — Progress Notes (Signed)
Physical Therapy Session Note  Patient Details  Name: Mark CroonLeon A Douthitt MRN: [id number] Date of Birth: 12/27/1921  Today's Date: 07/31/2013 Time: 0730-0825 Time Calculation (min): 55 min  Short Term Goals: Week 1:  PT Short Term Goal 1 (Week 1): =LTGS  Skilled Therapeutic Interventions/Progress Updates:    Ambulation 2 x 200' with RW and min-guard assist head turns (vertical/horizontal) for balance challenge; cues needed for safe proximity to RW and upright posture. Bed mobility on mat mod cues for log roll technique (S). Supine there-ex: transverse abdominus contractions with 5 sec holds - pt had difficulty producing adequate contraction. Added diaphragmic contraction with TA contraction for added spinal stabilization strengthening. Standing balance with one LE on compliant surface working towards one UE support - added TA contraction for spinal stability during functional activity.   Pt Reports Rt LE pain with all activities creating relative lumbar extension.   Therapy Documentation Precautions:  Precautions Precautions: Fall;Back Precaution Booklet Issued: No Precaution Comments: Becomes tachy with activity (per chart review).  Restrictions Weight Bearing Restrictions: No Pain: Pain Assessment Pain Score: 4  Pain Type: Acute pain Pain Location: Leg (in quad region) Pain Orientation: Right Pain Descriptors / Indicators: Aching Pain Onset: Progressive (with activity) Pain Intervention(s): Repositioned;Rest  See FIM for current functional status  Therapy/Group: Individual Therapy  [name], Cahlil Sattar Cheek 07/31/2013, 8:26 AM

## 2013-07-31 NOTE — Progress Notes (Signed)
Up to void, PVR=404. Blood still noted in urine. Requesting not to be cathed. Spoke to [name], [place], he said to hold off on cath until they assess patient and labs this AM. Mark Joseph, Mark Joseph

## 2013-08-01 ENCOUNTER — Inpatient Hospital Stay (HOSPITAL_COMMUNITY): Payer: Medicare Other | Admitting: Physical Therapy

## 2013-08-01 ENCOUNTER — Inpatient Hospital Stay (HOSPITAL_COMMUNITY): Payer: Medicare Other

## 2013-08-01 DIAGNOSIS — W19XXXA Unspecified fall, initial encounter: Secondary | ICD-10-CM

## 2013-08-01 DIAGNOSIS — S32009A Unspecified fracture of unspecified lumbar vertebra, initial encounter for closed fracture: Secondary | ICD-10-CM

## 2013-08-01 DIAGNOSIS — R5381 Other malaise: Secondary | ICD-10-CM

## 2013-08-01 NOTE — Progress Notes (Signed)
Physical Therapy Note  Patient Details  Name: Ashok CroonLeon A Gressman MRN: [id number] Date of Birth: 05/25/1921 Today's Date: 08/01/2013  Time: 1000-1100 60 minutes  1:1 No c/o pain.  Gait with RW with close supervision 20', 150', 75' with seated rest breaks in between.  Standing [place] HEP for LE strengthening and balance, pt requires multiple rest breaks due to fatigue.  Step ups to 4'' step with RW for LE strengthening 3 x 5 each LE with supervision.  Pt educated on need for handrails or ramp to ascend stairs into home at d/c.  Pt/wife agreeable.   Nanami Whitelaw 08/01/2013, 10:57 AM

## 2013-08-01 NOTE — Progress Notes (Signed)
Physical Therapy Session Note  Patient Details  Name: Mark Joseph MRN: [id number] Date of Birth: 09/13/1921  Today's Date: 08/01/2013 Time: 4098-11910735-0830  Total Time: 55 min  Short Term Goals: Week 1:  PT Short Term Goal 1 (Week 1): =LTGS  Skilled Therapeutic Interventions/Progress Updates:    Slept better last night. Min assist for donning clothes, mod cues for safety (such as placing RW in front prior to standing). Ambulation 2 x 200' with RW and min-guard assist head turns (horizontal and vertical) for challenge. Rest of session focused on safety with home entry. Discussed placing railing on steps. Pt felt he would not have any difficulty with steps. Trial of steps (simulated home set-up) without use of rails, pt unable to perform w/o support from rails due to pain down into leg. Able to do steps with railing and min assist. Discussed ramp vs. Railing, pt to discuss with wife but agrees he will need something to get into his home. Pt provided handout for ramp specifications. Recommended pt seek rail or ramp prior to return home.   Pt continues to report relief of pain with lumbar flexion, exacerbation with lumbar extension.   Therapy Documentation Precautions:  Precautions Precautions: Fall;Back Precaution Booklet Issued: No Precaution Comments: Becomes tachy with activity (per chart review).  Restrictions Weight Bearing Restrictions: No Pain: Pain Assessment Pain Assessment: 0-10 Pain Score: 0-No painReports 6/10 pain back/into Rt thigh particularly with extension of spine.   See FIM for current functional status  Therapy/Group: Individual Therapy  [name], Albeiro Trompeter Cheek 08/01/2013, 12:33 PM

## 2013-08-01 NOTE — Progress Notes (Signed)
Patient went to the bathroom to void several times during this shift with PVR  less than 350 cc. No c/o  bleeding or pain during void.

## 2013-08-01 NOTE — Progress Notes (Signed)
Occupational Therapy Session Note  Patient Details  Name: Ashok CroonLeon A Fetterman MRN: [id number] Date of Birth: 10/26/1921  Today's Date: 08/01/2013 Time: 1115-1200 Time Calculation (min): 45 min  Short Term Goals: Week 1:  OT Short Term Goal 1 (Week 1): Patient will complete toileiting with steadying assist (min a) to maintain standing balance OT Short Term Goal 2 (Week 1): Patient will complete transfers on/off BSC with min assist using LRAD OT Short Term Goal 3 (Week 1): Patient will demonstrate improved endurance as evidenced by completeing 60 miin ADL, sitting and standing, with 1 rest break OT Short Term Goal 4 (Week 1): Patient will demonstrate ability to complete B-UE HEP with supervision  Skilled Therapeutic Interventions/Progress Updates: ADL-retraining with focus on improved endurance, standing balance and improved safety awareness.   Patient received sitting in recliner, receptive for ADL.   Patient ambulated with min guard to bathroom and performed transfer onto shower seat with vc to approach closer grab bar.   Patient completed shower, sitting and standing w/o need for vc this session to use grab bars while standing.   Patient required min assist exiting shower due to right leg weakness and completed dressing while sitting at edge of bed with only setup assist to provide clothing.   OT discussed use of BSC instead of shower chair with patient and his wife for benefit of armrests as patient reports no grab bars in his shower.  Patient left sitting at edge of bed, wife present, call light within reach.  Therapy Documentation Precautions:  Precautions Precautions: Fall;Back Precaution Booklet Issued: No Precaution Comments: Becomes tachy with activity (per chart review).  Restrictions Weight Bearing Restrictions: No  Pain: Pain Assessment Pain Assessment: 0-10 Pain Score: 0-No pain  ADL: ADL ADL Comments: see FIM  See FIM for current functional status  Therapy/Group: Individual  Therapy  Second session: Time: [id number] Time Calculation (min):  30 min  Pain Assessment: No pain  Skilled Therapeutic Interventions: ADL-retraining with focus on shower stall transfer practice, simulated, home safety and falls reduction re-ed with use of ramps/railings, and general endurance.   Patient received in his recliner with wife present in room.   OT provided clarification on progress to date with goals relating to improved endurance, transfer safety, functional mobility and dynamic standing balance during ADL.   Practiced shower stall transfer in ADL apartment using shower frame simulator.   Patient able to complete side-stepping while maintaining contact with RW and reaching for [facility] used as shower chair.  Patient agreed that armrests on [facility] provided needed support for sit >< stand.  [facility], patient completed bed transfer using RW and performed bed mobility, sit<>supine and left/right rolls unassisted, although stating that his bed was higher than apartment bed.   Pat  See FIM for current functional status  Therapy/Group: Individual Therapy  Tobi Leinweber 08/01/2013, 12:35 PM

## 2013-08-01 NOTE — Progress Notes (Signed)
Subjective/Complaints: Emptied bladder a little better. Had a good night of sleep A 12 point review of systems has been performed and if not noted above is otherwise negative.   Objective: Vital Signs: Blood pressure 144/78, pulse 63, temperature 97.7 F (36.5 C), temperature source Oral, resp. rate 18, height 5\' 11"  (1.803 m), weight 67.3 kg (148 lb 5.9 oz), SpO2 100.00%. No results found. No results found for this basename: WBC, HGB, HCT, PLT,  in the last 72 hours No results found for this basename: NA, K, CL, CO, GLUCOSE, BUN, CREATININE, CALCIUM,  in the last 72 hours CBG (last 3)  No results found for this basename: GLUCAP,  in the last 72 hours  Wt Readings from Last 3 Encounters:  08/01/13 67.3 kg (148 lb 5.9 oz)  07/27/13 69.6 kg (153 lb 7 oz)  10/28/12 70.489 kg (155 lb 6.4 oz)    Physical Exam:  Constitutional: He is oriented to person, place, and time.  Frail appearing  HENT:  Head: Normocephalic.  Eyes: EOM are normal.  Neck: Normal range of motion. Neck supple. No thyromegaly present.  Cardiovascular:  Cardiac rate is controlled and regular. No murmurs Respiratory: Effort normal and breath sounds normal. No respiratory distress.  GI: Soft. Bowel sounds are normal. He exhibits no distension.  Genitourinary:  Neurological: He is alert and oriented to person, place, and time. HOH Patient pleasant and cooperative with exam. He followed commands. A little impulsive. Strength 4/5 in BUE and 4- to 4 BLE. Sensory exam grossly intact. Fair insight and awareness.  Musc: minimal pain with palpation right thigh-  Has muscle wasting BLE Skin: Skin is warm and dry    Assessment/Plan: 1. Functional deficits secondary to deconditioning related to multiple medical issues which require 3+ hours per day of interdisciplinary therapy in a comprehensive inpatient rehab setting. Physiatrist is providing close team supervision and 24 hour management of active medical  problems listed below. Physiatrist and rehab team continue to assess barriers to discharge/monitor patient progress toward functional and medical goals. FIM: FIM - Bathing Bathing Steps Patient Completed: Chest;Right Arm;Left Arm;Abdomen;Front perineal area;Buttocks;Right upper leg;Left upper leg;Right lower leg (including foot);Left lower leg (including foot) Bathing: 5: Supervision: Safety issues/verbal cues  FIM - Upper Body Dressing/Undressing Upper body dressing/undressing steps patient completed: Thread/unthread right sleeve of front closure shirt/dress;Thread/unthread left sleeve of front closure shirt/dress;Thread/unthread right sleeve of pullover shirt/dresss;Thread/unthread left sleeve of pullover shirt/dress;Put head through opening of pull over shirt/dress;Pull shirt over trunk;Button/unbutton shirt Upper body dressing/undressing: 4: Min-Patient completed 75 plus % of tasks FIM - Lower Body Dressing/Undressing Lower body dressing/undressing steps patient completed: Thread/unthread right underwear leg;Thread/unthread left underwear leg;Pull underwear up/down;Thread/unthread right pants leg;Thread/unthread left pants leg;Pull pants up/down;Fasten/unfasten pants;Don/Doff right sock;Don/Doff left sock;Don/Doff right shoe;Don/Doff left shoe;Fasten/unfasten right shoe;Fasten/unfasten left shoe Lower body dressing/undressing: 5: Supervision: Safety issues/verbal cues  FIM - Toileting Toileting steps completed by patient: Adjust clothing prior to toileting;Adjust clothing after toileting;Performs perineal hygiene Toileting Assistive Devices: Grab bar or rail for support Toileting: 4: Steadying assist  FIM - Diplomatic Services operational officerToilet Transfers Toilet Transfers Assistive Devices: Grab bars;Walker Toilet Transfers: 4-To toilet/BSC: Min A (steadying Pt. > 75%);4-From toilet/BSC: Min A (steadying Pt. > 75%)  FIM - Bed/Chair Transfer Bed/Chair Transfer Assistive Devices: Bed rails;HOB elevated;Walker Bed/Chair  Transfer: 5: Supine > Sit: Supervision (verbal cues/safety issues);4: Bed > Chair or W/C: Min A (steadying Pt. > 75%)  FIM - Locomotion: Wheelchair Distance: 45' Locomotion: Wheelchair: 5: Travels 150 ft  or more: maneuvers on rugs and over door sills with supervision, cueing or coaxing FIM - Locomotion: Ambulation Locomotion: Ambulation Assistive Devices: Walker - Rolling Ambulation/Gait Assistance: 4: Min guard Locomotion: Ambulation: 4: Travels 150 ft or more with minimal assistance (Pt.>75%)  Comprehension Comprehension Mode: Auditory Comprehension: 4-Understands basic 75 - 89% of the time/requires cueing 10 - 24% of the time  Expression Expression Mode: Verbal Expression: 4-Expresses basic 75 - 89% of the time/requires cueing 10 - 24% of the time. Needs helper to occlude trach/needs to repeat words.  Social Interaction Social Interaction Mode: Asleep Social Interaction: 5-Interacts appropriately 90% of the time - Needs monitoring or encouragement for participation or interaction.  Problem Solving Problem Solving: 4-Solves basic 75 - 89% of the time/requires cueing 10 - 24% of the time  Memory Memory: 5-Recognizes or recalls 90% of the time/requires cueing < 10% of the time  Medical Problem List and Plan:  1. Deconditioning after multi-medical recent L2 fracture  2. DVT Prophylaxis/Anticoagulation: Patient on Eliquis. Monitor for signs of DVT  3. Pain Management: Lidoderm patch, Tylenol. Monitor the increased mobility   - sports cream for right thigh---consider xr if persistent 4. Neuropsych: This patient is capable of making decisions on his own behalf.  5. Atrial fibrillation/incessant tachycardia. Continue amiodarone as well as Lopressor. Followup cardiology services  6. Hypertension. Continue with lisinopril as well as hydrochlorothiazide with Lopressor 50 mg twice a day. Monitor with increased mobility in  7. History of urinary retention. Continue Flomax and proscar. pvr's  increased so far  -increase flomax to bid bp permitting  -continue OOB to void, double voids, etc  -ua neg, cx negative 8. Hyperlipidemia. Zocor   LOS (Days) 5 A FACE TO FACE EVALUATION WAS PERFORMED  SWARTZ,ZACHARY T 08/01/2013 7:49 AM

## 2013-08-02 ENCOUNTER — Inpatient Hospital Stay (HOSPITAL_COMMUNITY): Payer: Medicare Other | Admitting: Physical Therapy

## 2013-08-02 ENCOUNTER — Inpatient Hospital Stay (HOSPITAL_COMMUNITY): Payer: Medicare Other

## 2013-08-02 ENCOUNTER — Inpatient Hospital Stay (HOSPITAL_COMMUNITY): Payer: Medicare Other | Admitting: Occupational Therapy

## 2013-08-02 DIAGNOSIS — S32009A Unspecified fracture of unspecified lumbar vertebra, initial encounter for closed fracture: Secondary | ICD-10-CM

## 2013-08-02 DIAGNOSIS — R5381 Other malaise: Secondary | ICD-10-CM

## 2013-08-02 DIAGNOSIS — W19XXXA Unspecified fall, initial encounter: Secondary | ICD-10-CM

## 2013-08-02 NOTE — Progress Notes (Signed)
Occupational Therapy Session Note  Patient Details  Name: Ashok CroonLeon A Nole MRN: [id number] Date of Birth: 09/24/1921  Today's Date: 08/02/2013 Time: [id number] Time Calculation (min): 40 min  Short Term Goals: Week 1:  OT Short Term Goal 1 (Week 1): Patient will complete toileiting with steadying assist (min a) to maintain standing balance OT Short Term Goal 2 (Week 1): Patient will complete transfers on/off BSC with min assist using LRAD OT Short Term Goal 3 (Week 1): Patient will demonstrate improved endurance as evidenced by completeing 60 miin ADL, sitting and standing, with 1 rest break OT Short Term Goal 4 (Week 1): Patient will demonstrate ability to complete B-UE HEP with supervision  Skilled Therapeutic Interventions/Progress Updates:    Engaged in ADL retraining with focus on activity tolerance, endurance, standing balance, and safety awareness with mobility.  Pt reports having showered yesterday, wanting to just dress during this session.  Dressing completed EOB, moved to recliner to don socks and shoes due to lower seat to floor height and ability to reach feet.  Encouraged standing with brushing teeth, pt insistent on sitting due to requiring BUE with cleaning partial and feeling uncomfortable in standing without UE support.  Pt stood for 1-2 mins to tuck in shirt with alternating BUE with close supervision.  Therapy Documentation Precautions:  Precautions Precautions: Fall;Back Precaution Booklet Issued: No Precaution Comments: Becomes tachy with activity (per chart review).  Restrictions Weight Bearing Restrictions: No General:   Vital Signs: Therapy Vitals Temp: 97.9 F (36.6 C) Temp src: Oral Pulse Rate: 65 Resp: 18 BP: 150/76 mmHg Patient Position, if appropriate: Lying Oxygen Therapy SpO2: 99 % O2 Device: None (Room air) Pain: Pain Assessment Pain Assessment: 0-10 Pain Score: 4  Pain Type: Acute pain Pain Location: Leg Pain Orientation: Right;Left Pain  Descriptors / Indicators: Aching Pain Frequency: Intermittent Pain Onset: Gradual Patients Stated Pain Goal: 2 Pain Intervention(s): Medication (See eMAR) ADL: ADL ADL Comments: see FIM  See FIM for current functional status  Therapy/Group: Individual Therapy  Rosalio LoudHOXIE, Hosam Mcfetridge 08/02/2013, 9:14 AM

## 2013-08-02 NOTE — Progress Notes (Signed)
Subjective/Complaints: Didn't do as well last night. Retaining urine still A 12 point review of systems has been performed and if not noted above is otherwise negative.   Objective: Vital Signs: Blood pressure 140/66, pulse 64, temperature 97.9 F (36.6 C), temperature source Oral, resp. rate 18, height 5\' 11"  (1.803 m), weight 68.9 kg (151 lb 14.4 oz), SpO2 99.00%. No results found. No results found for this basename: WBC, HGB, HCT, PLT,  in the last 72 hours No results found for this basename: NA, K, CL, CO, GLUCOSE, BUN, CREATININE, CALCIUM,  in the last 72 hours CBG (last 3)  No results found for this basename: GLUCAP,  in the last 72 hours  Wt Readings from Last 3 Encounters:  08/02/13 68.9 kg (151 lb 14.4 oz)  07/27/13 69.6 kg (153 lb 7 oz)  10/28/12 70.489 kg (155 lb 6.4 oz)    Physical Exam:  Constitutional: He is oriented to person, place, and time.  Frail appearing  HENT:  Head: Normocephalic.  Eyes: EOM are normal.  Neck: Normal range of motion. Neck supple. No thyromegaly present.  Cardiovascular:  Cardiac rate is controlled and regular. No murmurs Respiratory: Effort normal and breath sounds normal. No respiratory distress.  GI: Soft. Bowel sounds are normal. He exhibits no distension.  Genitourinary:  Neurological: He is alert and oriented to person, place, and time. HOH Patient pleasant and cooperative with exam. He followed commands. A little impulsive. Strength 4/5 in BUE and 4- to 4 BLE. Sensory exam grossly intact. Fair insight and awareness.  Musc: minimal pain with palpation right thigh-  Has muscle wasting BLE. Low back minimally tender Skin: Skin is warm and dry    Assessment/Plan: 1. Functional deficits secondary to deconditioning related to multiple medical issues which require 3+ hours per day of interdisciplinary therapy in a comprehensive inpatient rehab setting. Physiatrist is providing close team supervision and 24 hour management of  active medical problems listed below. Physiatrist and rehab team continue to assess barriers to discharge/monitor patient progress toward functional and medical goals. FIM: FIM - Bathing Bathing Steps Patient Completed: Chest;Right Arm;Left Arm;Abdomen;Front perineal area;Buttocks;Left upper leg;Right upper leg;Right lower leg (including foot);Left lower leg (including foot) Bathing: 5: Supervision: Safety issues/verbal cues  FIM - Upper Body Dressing/Undressing Upper body dressing/undressing steps patient completed: Thread/unthread right sleeve of pullover shirt/dresss;Thread/unthread left sleeve of pullover shirt/dress;Put head through opening of pull over shirt/dress;Pull shirt over trunk;Thread/unthread right sleeve of front closure shirt/dress;Thread/unthread left sleeve of front closure shirt/dress;Pull shirt around back of front closure shirt/dress;Button/unbutton shirt Upper body dressing/undressing: 5: Supervision: Safety issues/verbal cues FIM - Lower Body Dressing/Undressing Lower body dressing/undressing steps patient completed: Thread/unthread right underwear leg;Thread/unthread left underwear leg;Pull underwear up/down;Thread/unthread right pants leg;Thread/unthread left pants leg;Pull pants up/down;Fasten/unfasten pants;Don/Doff right sock;Don/Doff right shoe;Don/Doff left sock;Don/Doff left shoe;Fasten/unfasten right shoe;Fasten/unfasten left shoe Lower body dressing/undressing: 5: Supervision: Safety issues/verbal cues  FIM - Toileting Toileting steps completed by patient: Adjust clothing prior to toileting;Performs perineal hygiene;Adjust clothing after toileting Toileting Assistive Devices: Grab bar or rail for support Toileting: 5: Supervision: Safety issues/verbal cues  FIM - Diplomatic Services operational officerToilet Transfers Toilet Transfers Assistive Devices: Best boyWalker;Grab bars Toilet Transfers: 5-To toilet/BSC: Supervision (verbal cues/safety issues);5-From toilet/BSC: Supervision (verbal cues/safety  issues)  FIM - BankerBed/Chair Transfer Bed/Chair Transfer Assistive Devices: Walker;Bed rails Bed/Chair Transfer: 4: Supine > Sit: Min A (steadying Pt. > 75%/lift 1 leg);4: Sit > Supine: Min A (steadying pt. > 75%/lift 1 leg)  FIM - Locomotion: Wheelchair Distance:  45' Locomotion: Wheelchair: 5: Travels 150 ft or more: maneuvers on rugs and over door sills with supervision, cueing or coaxing FIM - Locomotion: Ambulation Locomotion: Ambulation Assistive Devices: Designer, industrial/product Ambulation/Gait Assistance: 4: Min guard Locomotion: Ambulation: 4: Travels 150 ft or more with minimal assistance (Pt.>75%)  Comprehension Comprehension Mode: Auditory Comprehension: 5-Understands complex 90% of the time/Cues < 10% of the time  Expression Expression Mode: Verbal Expression: 5-Expresses complex 90% of the time/cues < 10% of the time  Social Interaction Social Interaction Mode: Asleep Social Interaction: 7-Interacts appropriately with others - No medications needed.  Problem Solving Problem Solving: 5-Solves complex 90% of the time/cues < 10% of the time  Memory Memory: 5-Recognizes or recalls 90% of the time/requires cueing < 10% of the time  Medical Problem List and Plan:  1. Deconditioning after multi-medical recent L2 fracture  2. DVT Prophylaxis/Anticoagulation: Patient on Eliquis. Monitor for signs of DVT  3. Pain Management: Lidoderm patch, Tylenol. Monitor the increased mobility   - sports cream for right thigh---consider xr if persistent  -lidocaine patch for back 4. Neuropsych: This patient is capable of making decisions on his own behalf.  5. Atrial fibrillation/incessant tachycardia. Continue amiodarone as well as Lopressor. Followup cardiology services  6. Hypertension. Continue with lisinopril as well as hydrochlorothiazide with Lopressor 50 mg twice a day. Monitor with increased mobility in  7. History of urinary retention. Continue Flomax and proscar. pvr's have been elevated  still requiring daily cath  -increased flomax to bid bp permitting  -continue OOB to void, double voids, etc  -ua neg, cx negative  -may have to consider foley reinsertion 8. Hyperlipidemia. Zocor   LOS (Days) 6 A FACE TO FACE EVALUATION WAS PERFORMED  SWARTZ,ZACHARY T 08/02/2013 7:55 AM

## 2013-08-02 NOTE — Progress Notes (Signed)
Occupational Therapy Session Note  Patient Details  Name: Mark Joseph MRN: [id number] Date of Birth: 10/05/1921  Today's Date: 08/02/2013 Time: [id number] Time Calculation (min): 45 min  Short Term Goals: Week 1:  OT Short Term Goal 1 (Week 1): Patient will complete toileiting with steadying assist (min a) to maintain standing balance OT Short Term Goal 2 (Week 1): Patient will complete transfers on/off BSC with min assist using LRAD OT Short Term Goal 3 (Week 1): Patient will demonstrate improved endurance as evidenced by completeing 60 miin ADL, sitting and standing, with 1 rest break OT Short Term Goal 4 (Week 1): Patient will demonstrate ability to complete B-UE HEP with supervision  Skilled Therapeutic Interventions/Progress Updates:  Upon entering room, patient found seated in recliner with quick release belt donned. Patient stood with RW to adjust clothing, then ambulated with RW down hallway toward ADL apartment. Patient made it to nurses station and needed a seated rest break. After rest break, patient ambulated > ADL apartment and performed furniture transfer on/off couch with supervision. Patient ambulated > therapy gym and sat on therapy mat. Therapeutic activity in gym focusing on bilateral upper body strengthening (using 3lb weighted bar and 5lb weighted ball), sit<>stands, dynamic standing balance/tolerance/endurance, and overall activity tolerance/endurance. Patient required multiple seated rest breaks during session. At end of session, patient laid in side lying position on mat waiting on next therapist.   Precautions:  Precautions Precautions: Fall;Back Precaution Booklet Issued: No Precaution Comments: Becomes tachy with activity (per chart review).  Restrictions Weight Bearing Restrictions: No  See FIM for current functional status  Therapy/Group: Individual Therapy  Mark Joseph 08/02/2013, 2:37 PM

## 2013-08-02 NOTE — Progress Notes (Signed)
Occupational Therapy Session Note  Patient Details  Name: Mark CroonLeon A Pevehouse MRN: [id number] Date of Birth: 02/10/1922  Today's Date: 08/02/2013 Time: 1400-1430 Time Calculation (min): 30 min  Short Term Goals: Week 1:  OT Short Term Goal 1 (Week 1): Patient will complete toileiting with steadying assist (min a) to maintain standing balance OT Short Term Goal 2 (Week 1): Patient will complete transfers on/off BSC with min assist using LRAD OT Short Term Goal 3 (Week 1): Patient will demonstrate improved endurance as evidenced by completeing 60 miin ADL, sitting and standing, with 1 rest break OT Short Term Goal 4 (Week 1): Patient will demonstrate ability to complete B-UE HEP with supervision      Skilled Therapeutic Interventions/Progress Updates:    Pt seen this session for dynamic standing balance with reaching from waist to overhead level with placing checkers on incline board. Pt continually leaned through his elbows on the elevated table to take pressure of his back. Pt worked on bending his knees to reach for items below his waist versus twisting.  He needed quite a bit of cuing to follow through with bending his knees. Pt then worked on activity tolerance and UE strengthening with 2lb dowel bar from a seated position.  Pt completed exercises with minimal rest breaks.  Pt's PT has arrived for his next session.   Therapy Documentation Precautions:  Precautions Precautions: Fall;Back Precaution Booklet Issued: No Precaution Comments: Becomes tachy with activity (per chart review).  Restrictions Weight Bearing Restrictions: No    Pain: Pain Assessment Pain Assessment: 0-10 Pain Score: 4  Pain Type: Acute pain Pain Location: Back Pain Orientation: Lower Pain Descriptors / Indicators: Aching Pain Frequency: Intermittent Pain Onset: Gradual Patients Stated Pain Goal: 2 Pain Intervention(s): Medication (See eMAR) ADL: ADL ADL Comments: see FIM  See FIM for current functional  status  Therapy/Group: Individual Therapy  Kynsleigh Westendorf 08/02/2013, 2:53 PM

## 2013-08-02 NOTE — Progress Notes (Signed)
Social Work Patient ID: Mark Joseph, male   DOB: 1922/02/26, 80 y.o.   MRN: [id number]  Have reviewed team conference with pt and wife.  Both aware and agreeable with targeted d/c date of 4/1 with supervision goals.  Wife met with ramp company today to start process.  She does have concerns about pt's urinary retention and tendency to become easily "winded" with ambulation - asked Mark Liming, PA to speak with her.  Will continue to follow for d/c planning and support.  Mark Pincock, LCSW

## 2013-08-02 NOTE — Progress Notes (Signed)
Reviewed and in agreement with treatment provided.  

## 2013-08-02 NOTE — Progress Notes (Signed)
Physical Therapy Note  Patient Details  Name: Mark Joseph MRN: [id number] Date of Birth: 01/14/1922 Today's Date: 08/02/2013  Time: [phone] 60 minutes  1:1 No c/o pain, pt c/o back soreness with prolonged standing.  Gait with RW with supervision 60' x 2 with seated rest breaks in between.  Stair negotiation with B handrails 4 x 3 steps with cues for sequencing, min A.  Stair negotiation 2 x 3 steps with 1 handrail with min A.  Attempt steps with no handrails, pt unable even with HHA.  Curb step negotiation with RW with min A, pt states he can feel pulling in his back with lifting RW, educated pt that his wife may need to assist with lifting RW up curbs, pt agrees.  Nu step for LE/UE strength and endurance 5 min, 3 min level 3 with no c/o pain.  Pt continues to require frequent rest breaks due to fatigue, improved strength noted with stair negotiation.   Floyde Dingley 08/02/2013, 10:12 AM

## 2013-08-02 NOTE — Patient Care Conference (Signed)
Inpatient RehabilitationTeam Conference and Plan of Care Update Date: 08/01/2013   Time: 2:25 PM    Patient Name: Mark Joseph      Medical Record Number: [id number]  Date of Birth: 01/07/1922 Sex: Male         Room/Bed: 4M10C/4M10C-01 Payor Info: Payor: [employer] [place] [employer] / Plan: BLUE MEDICARE / Product Type: *No Product type* /    Admitting Diagnosis: DECONDITIONED, L2 FX  Admit Date/Time:  07/27/2013  2:42 PM Admission Comments: No comment available   Primary Diagnosis:  <principal problem not specified> Principal Problem: <principal problem not specified>  Patient Active Problem List   Diagnosis Date Noted  . Physical deconditioning 07/27/2013  . Atrial fibrillation with RVR 07/22/2013  . UTI (urinary tract infection) 10/12/2012  . S/P bilateral inguinal hernia repair 10/03/2012  . Leukocytosis 10/03/2012  . Hematoma 10/03/2012  . CVA (cerebral infarction) 01/30/2012  . Hypertension 01/30/2012    Expected Discharge Date: Expected Discharge Date: 08/09/13  Team Members Present: Physician leading conference: Dr. Faith RogueZachary Swartz Social Worker Present: Amada JupiterLucy Ersilia Brawley, LCSW Nurse Present: Keturah BarreEd Knisley, RN PT Present: Sherrine MaplesHannah Gillispie, Grayland OrmondPT;Alison Gray, PT OT Present: Donzetta KohutFrank Barthold, Loistine ChanceT;Karen [place], OT;Patricia [place], OT PPS Coordinator present : Edson SnowballBecky Windsor, PT     Current Status/Progress Goal Weekly Team Focus  Medical   deconditioning after multiple medical issues, urine retention, right knee pain  improve stamina and functional mobility  urinary retention rx, monitor CV parameters   Bowel/Bladder   continent of bowel and bladder. voided 2x this night shift with pvr's  < than 350.  avoid straight catheterization as possible.  allow time for patient to void in the toilet.   Swallow/Nutrition/ Hydration             ADL's   Supervision for B & D, min guard with transfers (toilet, bed, shower chair)  Supervision  Dynamic standing balance during ADL, safety  awareness, endurance   Mobility   min-guard assist with RW  supervision  standing balance, pain modulation, protection of compression fracture during movement, core stabilization   Communication             Safety/Cognition/ Behavioral Observations            Pain   c/o pain to lower back. lidoderm patch applied at night time. muscle rub to surrounding area applied with relief. Tylenol given PRN  pain less than 2  assess for pain q shift and prn   Skin   no skin breakdown  no skin breakdown while on rehab  assess skin q shift and prn    Rehab Goals Patient on target to meet rehab goals: Yes *See Care Plan and progress notes for long and short-term goals.  Barriers to Discharge: pain right knee/LE  / bladder    Possible Resolutions to Barriers:  railing/assistive devices at home/to enter, voiding plan    Discharge Planning/Teaching Needs:  home with wife who can provide 24/7 supervision      Team Discussion:  Some urinary retention - MD aware.  Need to make sure he is getting up to urinate at all times.  Therapies have discussed need for rails vs ramp and wife looking into ramp.  Anticipate reaching supervision goals.  Revisions to Treatment Plan:  None   Continued Need for Acute Rehabilitation Level of Care: The patient requires daily medical management by a physician with specialized training in physical medicine and rehabilitation for the following conditions: Daily direction of a multidisciplinary  physical rehabilitation program to ensure safe treatment while eliciting the highest outcome that is of practical value to the patient.: Yes Daily medical management of patient stability for increased activity during participation in an intensive rehabilitation regime.: Yes Daily analysis of laboratory values and/or radiology reports with any subsequent need for medication adjustment of medical intervention for : Cardiac problems;Post surgical problems;Neurological problems;Other  (bladder)  Soraiya Ahner 08/02/2013, 3:34 PM

## 2013-08-02 NOTE — Progress Notes (Signed)
Patient had a BM and voided in bathroom and NT PVR'd patient for 307cc. This RN performed I&O cath with 12Fr. Catheter with Lidocaine gel and cathed for 0cc. Patient felt no discomfort. Will put hat in bathroom to measure urine output. Will continue to monitor patient.

## 2013-08-02 NOTE — Progress Notes (Signed)
Physical Therapy Session Note  Patient Details  Name: Mark Joseph MRN: [id number] Date of Birth: 01/29/1922  Today's Date: 08/02/2013 Time: 4098-11911431-1459 Time Calculation (min): 28 min  Short Term Goals: Week 1:  PT Short Term Goal 1 (Week 1): =LTGS  Skilled Therapeutic Interventions/Progress Updates:    Session consisted of pt performing stand pivot transfers w/c<>chair with Min guard and therex to BLE's: LAQ (3x10), Standing Hip Abduction (1x10), Seated Marches (3x10), and Seated Adductor Pillow Squeezes (3x10) in order to improve BLE strength. Attempted to perform more standing therex but pt unable to tolerate standing activities due to BLE fatigue which pt stated was a result of already having multiple therapy sessions earlier in the day. Pt's wife asked questions about how long medication will affect pt's fatigue with gait with pt and wife being told that these questions should be directed towards Dr. Riley KillSwartz.  Therapy Documentation Precautions:  Precautions Precautions: Fall;Back Precaution Booklet Issued: No Precaution Comments: Becomes tachy with activity (per chart review).  Restrictions Weight Bearing Restrictions: No Pain: Pt had c/o mild R knee pain when performing therex. Rest breaks were taken when performing therex in order to help alleviate this pain.  See FIM for current functional status  Therapy/Group: Individual Therapy  Shelene Krage 08/02/2013, 3:30 PM

## 2013-08-03 ENCOUNTER — Inpatient Hospital Stay (HOSPITAL_COMMUNITY): Payer: Medicare Other | Admitting: Physical Therapy

## 2013-08-03 ENCOUNTER — Inpatient Hospital Stay (HOSPITAL_COMMUNITY): Payer: Medicare Other | Admitting: Occupational Therapy

## 2013-08-03 ENCOUNTER — Inpatient Hospital Stay (HOSPITAL_COMMUNITY): Payer: Medicare Other

## 2013-08-03 ENCOUNTER — Ambulatory Visit: Payer: Medicare Other | Admitting: Pharmacist

## 2013-08-03 NOTE — Progress Notes (Signed)
Occupational Therapy Session Note  Patient Details  Name: Mark Joseph MRN: [id number] Date of Birth: 08/16/1921  Today's Date: 08/03/2013 Time: 4098-11911450-1517 Time Calculation (min): 27 min  Skilled Therapeutic Interventions/Progress Updates:    Pt worked on standing balance using his RW during OT session.  He was able to perform sit to stand with min guard assist from the wheelchair and then work on standing while reaching for rings placed on his left side, using the LUE.  Emphasis on pt to bend his knees when reaching for the rings instead of flexing his back.  Pt tends to exhibit a flexed posture in standing in both the thoracic and lumbar areas.  Pt used the reacher to pick up the rings from the floor.  He needed min instructional cueing to move closer to the objects as he was attempting to pick up the rings. Pt tends to try and walk while holding on to the reacher so he needed re-direction to give the therapist the reacher and ambulate to the next ring.  Discussed use of a reacher to picking up items from the floor and having a reacher bag on his walker.  Pt's HR checked throughout session ranging from 51-65 BPM, O2 sats 98% on room air.   Therapy Documentation Precautions:  Precautions Precautions: Fall;Back Precaution Booklet Issued: No Precaution Comments: Becomes tachy with activity (per chart review).  Restrictions Weight Bearing Restrictions: No  Pain: Pain Assessment Pain Assessment: Faces Pain Score: 3  Faces Pain Scale: Hurts a little bit  See FIM for current functional status  Therapy/Group: Individual Therapy  Taylin Leder OTR/L 08/03/2013, 4:12 PM

## 2013-08-03 NOTE — Progress Notes (Signed)
NUTRITION FOLLOW UP  Intervention:   1. General healthful diet; encourage intake of foods and beverages as able. RD to follow and assess for nutritional adequacy.   NUTRITION DIAGNOSIS:  Increased nutrient needs related to healing as evidenced by lumbar fx.   Monitor:  1. Food/Beverage; pt meeting >/=90% estimated needs with tolerance.  2. Wt/wt change; monitor trends  Assessment:   Pt admitted with deconditioning r/t recent fall with lumbar fx.  Pt's weight has been stable since admission- ranging from 147-152 lbs based on fluid status and urinary rentention.  Pt continues to eat well- 80-100% of meals consistently.  Plan is for d/c 4/1.  Height: Ht Readings from Last 1 Encounters:  07/27/13 5\' 11"  (1.803 m)    Weight Status:   Wt Readings from Last 1 Encounters:  08/03/13 149 lb 7.6 oz (67.8 kg)    Re-estimated needs:  Kcal: 1850-2000  Protein: 79-92g  Fluid: ~2.0 L/day  Skin: intact  Diet Order: Cardiac   Intake/Output Summary (Last 24 hours) at 08/03/13 1421 Last data filed at 08/03/13 1300  Gross per 24 hour  Intake    960 ml  Output   1202 ml  Net   -242 ml    Last BM: 3/25   Labs:   Recent Labs Lab 07/28/13 0500  NA 135*  K 4.7  CL 97  CO2 25  BUN 20  CREATININE 1.05  CALCIUM 8.8  GLUCOSE 98    CBG (last 3)  No results found for this basename: GLUCAP,  in the last 72 hours  Scheduled Meds: . amiodarone  200 mg Oral BID  . apixaban  5 mg Oral BID  . finasteride  5 mg Oral Daily  . lisinopril  10 mg Oral Daily   And  . hydrochlorothiazide  12.5 mg Oral Daily  . lidocaine  1 patch Transdermal Q24H  . metoprolol tartrate  50 mg Oral BID  . pantoprazole  40 mg Oral Daily  . polyethylene glycol  17 g Oral Daily  . selenium  200 mcg Oral Daily  . simvastatin  10 mg Oral QHS  . tamsulosin  0.4 mg Oral BID    Continuous Infusions:   Loyce DysKacie Nealy Hickmon, MS RD LDN Clinical Inpatient Dietitian Pager: [phone] Weekend/After hours pager:  [phone]

## 2013-08-03 NOTE — Progress Notes (Signed)
Subjective/Complaints: Didn't do as well last night. Retaining urine still A 12 point review of systems has been performed and if not noted above is otherwise negative.   Objective: Vital Signs: Blood pressure 137/67, pulse 72, temperature 97.9 F (36.6 C), temperature source Oral, resp. rate 18, height 5\' 11"  (1.803 m), weight 67.8 kg (149 lb 7.6 oz), SpO2 95.00%. No results found. No results found for this basename: WBC, HGB, HCT, PLT,  in the last 72 hours No results found for this basename: NA, K, CL, CO, GLUCOSE, BUN, CREATININE, CALCIUM,  in the last 72 hours CBG (last 3)  No results found for this basename: GLUCAP,  in the last 72 hours  Wt Readings from Last 3 Encounters:  08/03/13 67.8 kg (149 lb 7.6 oz)  07/27/13 69.6 kg (153 lb 7 oz)  10/28/12 70.489 kg (155 lb 6.4 oz)    Physical Exam:  Constitutional: He is oriented to person, place, and time.  Frail appearing  HENT:  Head: Normocephalic.  Eyes: EOM are normal.  Neck: Normal range of motion. Neck supple. No thyromegaly present.  Cardiovascular:  Cardiac rate is controlled and regular. No murmurs Respiratory: Effort normal and breath sounds normal. No respiratory distress.  GI: Soft. Bowel sounds are normal. He exhibits no distension.  Genitourinary:  Neurological: He is alert and oriented to person, place, and time. HOH Patient pleasant and cooperative with exam. He followed commands. A little impulsive. Strength 4/5 in BUE and 4- to 4 BLE. Sensory exam grossly intact. Fair insight and awareness.  Musc: minimal pain with palpation right thigh-  Has muscle wasting BLE. Low back minimally tender Skin: Skin is warm and dry    Assessment/Plan: 1. Functional deficits secondary to deconditioning related to multiple medical issues which require 3+ hours per day of interdisciplinary therapy in a comprehensive inpatient rehab setting. Physiatrist is providing close team supervision and 24 hour management of  active medical problems listed below. Physiatrist and rehab team continue to assess barriers to discharge/monitor patient progress toward functional and medical goals. FIM: FIM - Bathing Bathing Steps Patient Completed: Chest;Right Arm;Left Arm;Abdomen;Front perineal area;Buttocks;Left upper leg;Right upper leg;Right lower leg (including foot);Left lower leg (including foot) Bathing: 5: Supervision: Safety issues/verbal cues  FIM - Upper Body Dressing/Undressing Upper body dressing/undressing steps patient completed: Thread/unthread right sleeve of pullover shirt/dresss;Thread/unthread left sleeve of pullover shirt/dress;Put head through opening of pull over shirt/dress;Pull shirt over trunk;Thread/unthread right sleeve of front closure shirt/dress;Thread/unthread left sleeve of front closure shirt/dress;Pull shirt around back of front closure shirt/dress;Button/unbutton shirt Upper body dressing/undressing: 5: Set-up assist to: Obtain clothing/put away FIM - Lower Body Dressing/Undressing Lower body dressing/undressing steps patient completed: Thread/unthread right pants leg;Thread/unthread left pants leg;Pull pants up/down;Don/Doff right sock;Don/Doff left sock;Don/Doff right shoe;Don/Doff left shoe;Fasten/unfasten right shoe;Fasten/unfasten left shoe Lower body dressing/undressing: 5: Supervision: Safety issues/verbal cues  FIM - Toileting Toileting steps completed by patient: Adjust clothing prior to toileting;Performs perineal hygiene;Adjust clothing after toileting Toileting Assistive Devices: Grab bar or rail for support Toileting: 5: Supervision: Safety issues/verbal cues  FIM - Diplomatic Services operational officer Devices: Best boy Transfers: 5-To toilet/BSC: Supervision (verbal cues/safety issues);5-From toilet/BSC: Supervision (verbal cues/safety issues)  FIM - Banker Devices: Walker;Bed rails Bed/Chair Transfer: 5:  Supine > Sit: Supervision (verbal cues/safety issues);5: Bed > Chair or W/C: Supervision (verbal cues/safety issues)  FIM - Locomotion: Wheelchair Distance: 45' Locomotion: Wheelchair: 5: Travels 150 ft or more: maneuvers on rugs and  over door sills with supervision, cueing or coaxing FIM - Locomotion: Ambulation Locomotion: Ambulation Assistive Devices: Walker - Rolling Ambulation/Gait Assistance: 4: Min guard Locomotion: Ambulation: 4: Travels 150 ft or more with minimal assistance (Pt.>75%)  Comprehension Comprehension Mode: Auditory Comprehension: 5-Understands complex 90% of the time/Cues < 10% of the time  Expression Expression Mode: Verbal Expression: 5-Expresses complex 90% of the time/cues < 10% of the time  Social Interaction Social Interaction Mode: Asleep Social Interaction: 7-Interacts appropriately with others - No medications needed.  Problem Solving Problem Solving: 5-Solves complex 90% of the time/cues < 10% of the time  Memory Memory: 5-Recognizes or recalls 90% of the time/requires cueing < 10% of the time  Medical Problem List and Plan:  1. Deconditioning after multi-medical recent L2 fracture  2. DVT Prophylaxis/Anticoagulation: Patient on Eliquis. Monitor for signs of DVT  3. Pain Management: Lidoderm patch, Tylenol. Monitor the increased mobility   - sports cream for right thigh---consider xr if persistent  -lidocaine patch for back 4. Neuropsych: This patient is capable of making decisions on his own behalf.  5. Atrial fibrillation/incessant tachycardia. Continue amiodarone as well as Lopressor. Followup cardiology services  6. Hypertension. Continue with lisinopril as well as hydrochlorothiazide with Lopressor 50 mg twice a day. Monitor with increased mobility in  7. History of urinary retention. Continue Flomax and proscar. pvr's have been elevated still requiring daily cath  -increased flomax to bid bp    -continue OOB to void, double voids, etc  -ua  neg, cx negative  -discussed fluid rationing, avoiding intake in the evening  -may have to consider foley reinsertion 8. Hyperlipidemia. Zocor   LOS (Days) 7 A FACE TO FACE EVALUATION WAS PERFORMED  SWARTZ,ZACHARY T 08/03/2013 7:36 AM

## 2013-08-03 NOTE — Progress Notes (Signed)
Occupational Therapy Session Note  Patient Details  Name: Mark Joseph MRN: [id number] Date of Birth: 10/21/1921  Today's Date: 08/03/2013 Time: 0930-1028 Time Calculation (min): 58 min  Short Term Goals: Week 1:  OT Short Term Goal 1 (Week 1): Patient will complete toileiting with steadying assist (min a) to maintain standing balance OT Short Term Goal 2 (Week 1): Patient will complete transfers on/off BSC with min assist using LRAD OT Short Term Goal 3 (Week 1): Patient will demonstrate improved endurance as evidenced by completeing 60 miin ADL, sitting and standing, with 1 rest break OT Short Term Goal 4 (Week 1): Patient will demonstrate ability to complete B-UE HEP with supervision  Skilled Therapeutic Interventions/Progress Updates:  ADL-retraining with focus on improved endurance during ADL.   Patient completed bathing and dressing session, sitting and standing, with only supervision and setup assist and 1 rest break sitting at edge of bed after bathing.   Spouse present to report contact with vendor for ramp, installation to occur prior to 08/09/13, and to report measurement of shower at their home as 33" X 38."   Per spouse, their BSC is 22" and fits into shower.   OT recommended supervision with bathing and transfers for safety as no grab bars are installed in shower.       Therapy Documentation Precautions:  Precautions Precautions: Fall;Back Precaution Booklet Issued: No Precaution Comments: Becomes tachy with activity (per chart review).  Restrictions Weight Bearing Restrictions: No  Vital Signs: Therapy Vitals Pulse Rate: 75 BP: 137/88 mmHg  Pain: No pain   ADL: ADL ADL Comments: see FIM Exercises:   Other Treatments:    See FIM for current functional status  Therapy/Group: Individual Therapy  Second session: Time: 1330-1415 Time Calculation (min):  45 min  Pain Assessment: 2-3/10, low back, RN notified, meds requested  Skilled Therapeutic Interventions:  Therapeutic activity with focus on endurance, B-UE strengthening, and dynamic standing balance.   Patient performed Sci-Fit UE ergometry X 10 min, alternating forward/backward pedaling with 2 rest breaks (at 3 min intervals).   From Sci-Fit patient was educated on benefit of recreational activity, playing Wii bowling game, and patient quickly learned to play game, sitting and standing (5 frames), with only standby assist for safety while standing at walker and using right arm to operate Wii remote.  See FIM for current functional status  Therapy/Group: Individual Therapy  [name] 08/03/2013, 10:34 AM

## 2013-08-03 NOTE — Progress Notes (Signed)
Physical Therapy Session Note  Patient Details  Name: Mark Joseph MRN: [id number] Date of Birth: 04/23/1922  Today's Date: 08/03/2013 Time: 1103-1200 Time Calculation (min): 57 min  Short Term Goals: Week 1:  PT Short Term Goal 1 (Week 1): =LTGS  Skilled Therapeutic Interventions/Progress Updates:    Wife reports she will have a rental metal ramp up by time of D/C. Discussed permanent vs. Rental ramp - wife feels they will not need ramp long term. Wife brought up eventual move to independent living facility which PT also agrees with to lessen falls risk of both pt and wife.   Ambulation 3 x 6570' with RW and min-guard assist, less distance than earlier (pt reports decreased endurance - feels it is medication related, PT recommending he discuss with MD). Curb step up/down and ramp with RW x 3 reps with seated rest in between, min assist and mod verbal cues pt able to move RW up/down curb step without significant pain. TA/core exercise supine + marching and bil LE curl ups. Sit <> supine on mat performed with up to mod assist, pt has difficulty with log roll technique. Pt continues to have pain into Rt thigh with relative back extension.    Therapy Documentation Precautions:  Precautions Precautions: Fall;Back Precaution Booklet Issued: No Precaution Comments: Becomes tachy with activity (per chart review).  Restrictions Weight Bearing Restrictions: No Pain: Pain Assessment Faces Pain Scale: Hurts even more Pain Type: Acute pain Pain Location: Leg Pain Orientation: Right Pain Descriptors / Indicators: Aching Pain Onset: With Activity (back extension) Pain Intervention(s): Repositioned;Rest  See FIM for current functional status  Therapy/Group: Individual Therapy  [name], [name] 08/03/2013, 12:06 PM

## 2013-08-04 ENCOUNTER — Inpatient Hospital Stay (HOSPITAL_COMMUNITY): Payer: Medicare Other

## 2013-08-04 ENCOUNTER — Inpatient Hospital Stay (HOSPITAL_COMMUNITY): Payer: Medicare Other | Admitting: Physical Therapy

## 2013-08-04 DIAGNOSIS — S32009A Unspecified fracture of unspecified lumbar vertebra, initial encounter for closed fracture: Secondary | ICD-10-CM

## 2013-08-04 DIAGNOSIS — W19XXXA Unspecified fall, initial encounter: Secondary | ICD-10-CM

## 2013-08-04 DIAGNOSIS — R5381 Other malaise: Secondary | ICD-10-CM

## 2013-08-04 NOTE — Progress Notes (Signed)
Occupational Therapy Session Note  Patient Details  Name: Mark Joseph MRN: [id number] Date of Birth: 10/21/1921  Today's Date: 08/04/2013 Time: 0728-0822 Time Calculation (min): 54 min  Short Term Goals: Week 1:  OT Short Term Goal 1 (Week 1): Patient will complete toileiting with steadying assist (min a) to maintain standing balance OT Short Term Goal 2 (Week 1): Patient will complete transfers on/off BSC with min assist using LRAD OT Short Term Goal 3 (Week 1): Patient will demonstrate improved endurance as evidenced by completeing 60 miin ADL, sitting and standing, with 1 rest break OT Short Term Goal 4 (Week 1): Patient will demonstrate ability to complete B-UE HEP with supervision  Skilled Therapeutic Interventions/Progress Updates:  ADL-retraining with focus on endurance, dynamics standing balance, safety awareness and transfers (bed, chair, shower seat).   Patient received in recliner prepared for planned ADL session.   Patient aware of ADL routine and progressed with min cues this session, specifically to transfer closer to grab bar and seat and to use grab bar while standing.   Patient reports bed height at home is 28."  No physical assist required for ADL this session beyond setup with clothing and supplies to improve efficiency.    Therapy Documentation Precautions:  Precautions Precautions: Fall;Back Precaution Booklet Issued: No Precaution Comments: Becomes tachy with activity (per chart review).  Restrictions Weight Bearing Restrictions: No  Vital Signs: Therapy Vitals Temp: 98.5 F (36.9 C) Temp src: Oral Pulse Rate: 55 Resp: 18 BP: 135/71 mmHg Patient Position, if appropriate: Lying Oxygen Therapy SpO2: 97 % O2 Device: None (Room air)  Pain: Pain Assessment Pain Assessment: 0-10 Pain Score: 2  Pain Location: Back  ADL: ADL ADL Comments: see FIM  See FIM for current functional status  Therapy/Group: Individual Therapy  Second session: Time:  1130-1200 Time Calculation (min):  30 min  Pain Assessment: No pain  Skilled Therapeutic Interventions: Therapeutic exercises with focus on B-UE strengthening HEP to improve transfer safety and endurance during supported dynamic standing activities.   Patient completed 6 exercises, 3-10 reps, with demonstration and hand guidance for proper execution of movements targeting chest, shoulders, biceps, triceps, and upper/lower back.  Literature provided with emphasis placed on performing exercises "seated", 2-3 X/wk, 10-20 reps.   Patient demo'd competence with exercises provided and affirmed familiarity with thera-band from prior treatments.  See FIM for current functional status  Therapy/Group: Individual Therapy  Dashay Giesler 08/04/2013, 8:23 AM

## 2013-08-04 NOTE — Progress Notes (Signed)
Occupational Therapy Weekly Progress Note  Patient Details  Name: Mark Joseph MRN: [id number] Date of Birth: 1921-09-26  Today's Date: 08/04/2013  Patient has met 4 of 4 short term goals.    Patient continues to demonstrate the following deficits: Impaired endurance, impaired dynamic standing balance, and impaired safety awarenes and therefore will continue to benefit from skilled OT intervention to enhance overall performance with BADL and iADL.  Patient progressing toward long term goals..  Continue plan of care.  OT Short Term Goals Week 1:  OT Short Term Goal 1 (Week 1): Patient will complete toileiting with steadying assist (min a) to maintain standing balance OT Short Term Goal 1 - Progress (Week 1): Met OT Short Term Goal 2 (Week 1): Patient will complete transfers on/off BSC with min assist using LRAD OT Short Term Goal 2 - Progress (Week 1): Met OT Short Term Goal 3 (Week 1): Patient will demonstrate improved endurance as evidenced by completeing 60 miin ADL, sitting and standing, with 1 rest break OT Short Term Goal 3 - Progress (Week 1): Met OT Short Term Goal 4 (Week 1): Patient will demonstrate ability to complete B-UE HEP with supervision OT Short Term Goal 4 - Progress (Week 1): Met Week 2:  OT Short Term Goal 1 (Week 2): STG=LTG due to planned discharge within 6 days   Therapy Documentation Precautions:  Precautions Precautions: Fall;Back Precaution Booklet Issued: No Precaution Comments: Becomes tachy with activity (per chart review).  Restrictions Weight Bearing Restrictions: No  Vital Signs: Therapy Vitals Pulse Rate: 64 (with ambulation)  Pain: Pain Assessment Pain Score: 2  Pain Type: Acute pain Pain Location: Leg Pain Orientation: Right Pain Descriptors / Indicators: Aching Pain Onset: With Activity Pain Intervention(s): Rest (pt pleased with current level of pain)  ADL: ADL ADL Comments: see FIM   [facility] 08/04/2013, 12:35 PM

## 2013-08-04 NOTE — Progress Notes (Signed)
Physical Therapy Weekly Progress Note  Patient Details  Name: Mark Joseph MRN: [id number] Date of Birth: 04/02/1922  Today's Date: 08/04/2013 Time: 0930-1025 Time Calculation (min): 55 min  Patient has met 2 of 10 long term goals.  Pt is currently min-guard assist on the verge of supervision however he is progressing slowly due to what appears to be age related impairments with memory in addition to onset of Rt LE pain with back extension and standing without bil UE support. Pt and his wife have arranged for a rental ramp placement instead of stairs (stair goal modified to using bil railing in preparation for eventual return to steps with railings).   Patient continues to demonstrate the following deficits: pain in Rt thigh, back, decreased endurance, decreased balance, difficulty recalling new instruction/information therefore has impaired safety during transfers and during simulated home activities and therefore will continue to benefit from skilled PT intervention to enhance overall performance with activity tolerance, balance, ability to compensate for deficits and awareness.  Patient progressing toward long term goals..  Plan of care revisions: goals updated to reflect anticipated progress.  PT Short Term Goals No short term goals set - refer to unmet LTGs  Skilled Therapeutic Interventions/Progress Updates:    Session focused on bed mobility and accessing elevated bed (28") and use of step. Practiced home simulated bed set-up with RW and use of step x 6 reps repetition for improved retention, pt progressed from max verbal and tactile cues to min. Pt demonstrating good improvements with technique in and out of bed (log roll). Ambulation 3 x 60' with RW and min-guard assist - pt winded and required rest HR 64. Ambulation distance demonstrating overall decline. Step up/downs for strengthening and conditioning to 4" step with bil rails, min-guard assist.   Second Session Time: 1761-6073    Time Calculation (min): 44 min Skilled Therapeutic Interventions/Progress Updates:  Home environment ambulation forwards/side stepping/back stepping with RW over tiled and carpeted surfaces. Sit <> stands from low couch. Bed mobility on ADL bed good recall of log roll technique, good efficiency (S). Kitchen mobility working on balance with retrieval of items from shelf, sit <> stands to/from low armless chair. Controlled environment ambulation x 130', 50' with RW and min-guard assist. Discussed safety with home mobility.   Static balance on hard and compliant surface with varied bases of support working towards decreasing UE support, pt reports pain into Rt thigh with decreased UE support.   Wife and pt concerned about pt's inconsistency with gait distance and need to sit quickly at times - continue to feel medications awry. PA made aware. Wife requested trial of rollator, PT not recommending due to pt's current kyphotic posture and tendency to push walker too far ahead - rollator would maximize these deficits.   Therapy Documentation Precautions:  Precautions Precautions: Fall;Back Precaution Booklet Issued: No Precaution Comments: Becomes tachy with activity (per chart review).  Restrictions Weight Bearing Restrictions: No Vital Signs: Therapy Vitals Pulse Rate: 64 (with ambulation) Pain: Pain Assessment First session Pain Score: 2  Pain Type: Acute pain Pain Location: Leg Pain Orientation: Right Pain Descriptors / Indicators: Aching Pain Onset: With Activity Pain Intervention(s): Rest (pt pleased with current level of pain)  Second session Pain: upper back and into Rt upper thigh particularly when bil UEs unsupported in standing. Not rated this session. Relieved by rest and use of RW.  See FIM for current functional status  Therapy/Group: Individual Therapy both sessions  Lahoma Rocker 08/04/2013, 12:29 PM

## 2013-08-04 NOTE — Progress Notes (Signed)
Subjective/Complaints: Had another good night. Does note some fatigue with therapy.  A 12 point review of systems has been performed and if not noted above is otherwise negative.   Objective: Vital Signs: Blood pressure 143/66, pulse 57, temperature 98.5 F (36.9 C), temperature source Oral, resp. rate 18, height 5\' 11"  (1.803 m), weight 68.8 kg (151 lb 10.8 oz), SpO2 97.00%. No results found. No results found for this basename: WBC, HGB, HCT, PLT,  in the last 72 hours No results found for this basename: NA, K, CL, CO, GLUCOSE, BUN, CREATININE, CALCIUM,  in the last 72 hours CBG (last 3)  No results found for this basename: GLUCAP,  in the last 72 hours  Wt Readings from Last 3 Encounters:  08/04/13 68.8 kg (151 lb 10.8 oz)  07/27/13 69.6 kg (153 lb 7 oz)  10/28/12 70.489 kg (155 lb 6.4 oz)    Physical Exam:  Constitutional: He is oriented to person, place, and time.  Frail appearing  HENT:  Head: Normocephalic.  Eyes: EOM are normal.  Neck: Normal range of motion. Neck supple. No thyromegaly present.  Cardiovascular:  Cardiac rate is controlled and regular. No murmurs Respiratory: Effort normal and breath sounds normal. No respiratory distress. Chest is clear GI: Soft. Bowel sounds are normal. He exhibits no distension.  Genitourinary:  Neurological: He is alert and oriented to person, place, and time. HOH Patient pleasant and cooperative with exam. He followed commands. A little impulsive. Strength 4/5 in BUE and 4- to 4 BLE. Sensory exam grossly intact. Fair insight and awareness.  Musc: minimal pain with palpation right thigh-  Has muscle wasting BLE. Low back minimally tender Skin: Skin is warm and dry    Assessment/Plan: 1. Functional deficits secondary to deconditioning related to multiple medical issues which require 3+ hours per day of interdisciplinary therapy in a comprehensive inpatient rehab setting. Physiatrist is providing close team supervision  and 24 hour management of active medical problems listed below. Physiatrist and rehab team continue to assess barriers to discharge/monitor patient progress toward functional and medical goals. FIM: FIM - Bathing Bathing Steps Patient Completed: Chest;Right Arm;Left Arm;Abdomen;Front perineal area;Buttocks;Right upper leg;Left upper leg;Right lower leg (including foot);Left lower leg (including foot) Bathing: 5: Supervision: Safety issues/verbal cues  FIM - Upper Body Dressing/Undressing Upper body dressing/undressing steps patient completed: Thread/unthread right sleeve of pullover shirt/dresss;Thread/unthread left sleeve of pullover shirt/dress;Put head through opening of pull over shirt/dress;Pull shirt over trunk;Thread/unthread right sleeve of front closure shirt/dress;Thread/unthread left sleeve of front closure shirt/dress;Pull shirt around back of front closure shirt/dress;Button/unbutton shirt Upper body dressing/undressing: 5: Set-up assist to: Obtain clothing/put away FIM - Lower Body Dressing/Undressing Lower body dressing/undressing steps patient completed: Thread/unthread right underwear leg;Thread/unthread left underwear leg;Pull underwear up/down;Thread/unthread right pants leg;Thread/unthread left pants leg;Pull pants up/down;Fasten/unfasten pants;Don/Doff right sock;Don/Doff left sock;Don/Doff right shoe;Don/Doff left shoe;Fasten/unfasten right shoe;Fasten/unfasten left shoe Lower body dressing/undressing: 5: Set-up assist to: Obtain clothing  FIM - Toileting Toileting steps completed by patient: Adjust clothing prior to toileting;Performs perineal hygiene;Adjust clothing after toileting Toileting Assistive Devices: Grab bar or rail for support Toileting: 5: Supervision: Safety issues/verbal cues  FIM - Diplomatic Services operational officerToilet Transfers Toilet Transfers Assistive Devices: Best boyWalker;Grab bars Toilet Transfers: 5-To toilet/BSC: Supervision (verbal cues/safety issues);5-From toilet/BSC: Supervision  (verbal cues/safety issues)  FIM - BankerBed/Chair Transfer Bed/Chair Transfer Assistive Devices: Walker;Bed rails Bed/Chair Transfer: 5: Supine > Sit: Supervision (verbal cues/safety issues);3: Sit > Supine: Mod A (lifting assist/Pt. 50-74%/lift 2 legs);4: Bed > Chair or  W/C: Min A (steadying Pt. > 75%);5: Chair or W/C > Bed: Supervision (verbal cues/safety issues)  FIM - Locomotion: Wheelchair Distance: 45' Locomotion: Wheelchair: 5: Travels 150 ft or more: maneuvers on rugs and over door sills with supervision, cueing or coaxing FIM - Locomotion: Ambulation Locomotion: Ambulation Assistive Devices: Designer, industrial/product Ambulation/Gait Assistance: 4: Min guard Locomotion: Ambulation: 2: Travels 50 - 149 ft with minimal assistance (Pt.>75%)  Comprehension Comprehension Mode: Auditory Comprehension: 5-Understands complex 90% of the time/Cues < 10% of the time  Expression Expression Mode: Verbal Expression: 5-Expresses complex 90% of the time/cues < 10% of the time  Social Interaction Social Interaction Mode: Asleep Social Interaction: 7-Interacts appropriately with others - No medications needed.  Problem Solving Problem Solving: 5-Solves complex 90% of the time/cues < 10% of the time  Memory Memory: 5-Recognizes or recalls 90% of the time/requires cueing < 10% of the time  Medical Problem List and Plan:  1. Deconditioning after multi-medical recent L2 fracture  2. DVT Prophylaxis/Anticoagulation: Patient on Eliquis. Monitor for signs of DVT  3. Pain Management: Lidoderm patch, Tylenol. Monitor the increased mobility   - sports cream for right thigh---consider xr if persistent  -lidocaine patch for back 4. Neuropsych: This patient is capable of making decisions on his own behalf.  5. Atrial fibrillation/incessant tachycardia. Continue amiodarone as well as Lopressor.   -occasional stamina issues---may be due HR control of meds which don't allow his HR to increase enough with  exercise 6. Hypertension. Continue with lisinopril as well as hydrochlorothiazide with Lopressor 50 mg twice a day. Monitor with increased mobility in  7. History of urinary retention. Continue Flomax and proscar. pvr's have been elevated still requiring daily cath  -increased flomax to bid    -continue OOB to void, double voids, etc  -ua neg, cx negative  -discussed fluid rationing, avoiding intake in the evening  -pvr's are still up but better 8. Hyperlipidemia. Zocor   LOS (Days) 8 A FACE TO FACE EVALUATION WAS PERFORMED  Montae Stager T 08/04/2013 7:29 AM

## 2013-08-05 ENCOUNTER — Inpatient Hospital Stay (HOSPITAL_COMMUNITY): Payer: Medicare Other | Admitting: *Deleted

## 2013-08-05 DIAGNOSIS — I6350 Cerebral infarction due to unspecified occlusion or stenosis of unspecified cerebral artery: Secondary | ICD-10-CM

## 2013-08-05 DIAGNOSIS — I4891 Unspecified atrial fibrillation: Secondary | ICD-10-CM

## 2013-08-05 DIAGNOSIS — I10 Essential (primary) hypertension: Secondary | ICD-10-CM

## 2013-08-05 DIAGNOSIS — R5381 Other malaise: Secondary | ICD-10-CM

## 2013-08-05 NOTE — Progress Notes (Signed)
Occupational Therapy Note  Patient Details  Name: Mark Joseph MRN: [id number] Date of Birth: 05/20/1921 Today's Date: 08/05/2013  Time:1015-1100  ( 45 min) Pain:  none Individual session  Pt. Sitting in recliner upon OT arrival.  Agreed to sponge at sink today since he showered on Friday.  Focus of treatment was endurance, standing balance, wc safety, transfers, sit to stand, functional mobility.   Pt. ambulated with RW tp sink with close supervision.  Went from sit to stand with SBA.  Pt. Completed bathing at sink and transferred to EOB to finish dressing.  Transferred with SBA to recliner to donn socks and slippers.  Left pt in recliner with  call bell,phone within reach.        Humberto Sealsdwards, Kennie Karapetian J 08/05/2013, 10:38 AM

## 2013-08-05 NOTE — Progress Notes (Signed)
Patient has been voiding but PVR's have been >400cc the last 2 scans. Patient stated he felt some discomfort in his bladder area and "would probably feel better if we drained it." I&O cath performed and 700cc was removed. Patient feels better and will try to get some sleep. RN will continue to monitor.

## 2013-08-05 NOTE — Progress Notes (Signed)
Mark Joseph is a 80 y.o. male 02/23/1922 [id number]  Subjective: No new complaints. No new problems. Slept well. Feeling OK.  Objective: Vital signs in last 24 hours: Temp:  [97.9 F (36.6 C)-98.1 F (36.7 C)] 97.9 F (36.6 C) (03/28 0451) Pulse Rate:  [55-64] 60 (03/28 0815) Resp:  [17-18] 17 (03/28 0451) BP: (108-150)/(60-77) 120/70 mmHg (03/28 0451) SpO2:  [98 %-100 %] 100 % (03/28 0451) Weight:  [151 lb 7.3 oz (68.7 kg)] 151 lb 7.3 oz (68.7 kg) (03/28 0451) Weight change: -3.5 oz (-0.1 kg) Last BM Date: 08/04/13  Intake/Output from previous day: 03/27 0701 - 03/28 0700 In: 840 [P.O.:840] Out: 1479 [Urine:1478; Stool:1] Last cbgs: CBG (last 3)  No results found for this basename: GLUCAP,  in the last 72 hours   Physical Exam General: No apparent distress   HEENT: not dry Lungs: Normal effort. Lungs clear to auscultation, no crackles or wheezes. Cardiovascular: Regular rate and rhythm, no edema Abdomen: S/NT/ND; BS(+) Musculoskeletal:  unchanged Neurological: No new neurological deficits Wounds: N/A    Skin: clear  Aging changes Mental state: Alert, oriented, cooperative Reading a book    Lab Results: BMET    Component Value Date/Time   NA 135* 07/28/2013 0500   K 4.7 07/28/2013 0500   CL 97 07/28/2013 0500   CO2 25 07/28/2013 0500   GLUCOSE 98 07/28/2013 0500   BUN 20 07/28/2013 0500   CREATININE 1.05 07/28/2013 0500   CALCIUM 8.8 07/28/2013 0500   GFRNONAA 61* 07/28/2013 0500   GFRAA 70* 07/28/2013 0500   CBC    Component Value Date/Time   WBC 7.2 07/28/2013 0500   RBC 3.70* 07/28/2013 0500   HGB 12.0* 07/28/2013 0500   HCT 33.3* 07/28/2013 0500   PLT 340 07/28/2013 0500   MCV 90.0 07/28/2013 0500   MCH 32.4 07/28/2013 0500   MCHC 36.0 07/28/2013 0500   RDW 12.8 07/28/2013 0500   LYMPHSABS 1.8 07/28/2013 0500   MONOABS 0.6 07/28/2013 0500   EOSABS 0.1 07/28/2013 0500   BASOSABS 0.0 07/28/2013 0500    Studies/Results: No results found.  Medications: I have  reviewed the patient's current medications.  Assessment/Plan:  1. Deconditioning after multi-medical recent L2 fracture  2. DVT Prophylaxis/Anticoagulation: Patient on Eliquis. Monitor for signs of DVT  3. Pain Management: Lidoderm patch, Tylenol. Monitor the increased mobility  - sports cream for right thigh---consider xr if persistent  -lidocaine patch for back  4. Neuropsych: This patient is capable of making decisions on his own behalf.  5. Atrial fibrillation/incessant tachycardia. Continue amiodarone as well as Lopressor.  -occasional stamina issues---may be due HR control of meds which don't allow his HR to increase enough with exercise  6. Hypertension. Continue with lisinopril as well as hydrochlorothiazide with Lopressor 50 mg twice a day. Monitor with increased mobility in  7. History of urinary retention. Continue Flomax and proscar. pvr's have been elevated still requiring daily cath  -increased flomax to bid  -continue OOB to void, double voids, etc  -ua neg, cx negative  -discussed fluid rationing, avoiding intake in the evening  -pvr's are still up but better  8. Hyperlipidemia. Zocor   Cont Rx     Length of stay, days: 9  Sonda PrimesAlex Joseph , MD 08/05/2013, 9:26 AM

## 2013-08-06 MED ORDER — METOPROLOL TARTRATE 12.5 MG HALF TABLET
12.5000 mg | ORAL_TABLET | Freq: Two times a day (BID) | ORAL | Status: DC
  Administered 2013-08-07 – 2013-08-09 (×5): 12.5 mg via ORAL
  Filled 2013-08-06 (×7): qty 1

## 2013-08-06 NOTE — Progress Notes (Signed)
Mark Joseph is a 80 y.o. male 07/31/1921 [id number]  Subjective: No new complaints. No new problems. Slept well. Feeling OK. His Toprol was held due to bradycardia  Objective: Vital signs in last 24 hours: Temp:  [97.8 F (36.6 C)-98.2 F (36.8 C)] 97.8 F (36.6 C) (03/29 0446) Pulse Rate:  [50-54] 54 (03/29 0755) Resp:  [17-18] 17 (03/29 0446) BP: (90-122)/(52-62) 122/62 mmHg (03/29 0446) SpO2:  [97 %-99 %] 97 % (03/29 0446) Weight:  [152 lb 8.9 oz (69.2 kg)] 152 lb 8.9 oz (69.2 kg) (03/29 0446) Weight change: 1 lb 1.6 oz (0.5 kg) Last BM Date: 08/04/13  Intake/Output from previous day: 03/28 0701 - 03/29 0700 In: 240 [P.O.:240] Out: 1150 [Urine:1150] Last cbgs: CBG (last 3)  No results found for this basename: GLUCAP,  in the last 72 hours   Physical Exam General: No apparent distress   HEENT: not dry Lungs: Normal effort. Lungs clear to auscultation, no crackles or wheezes. Cardiovascular: Regular rate and rhythm, no edema Abdomen: S/NT/ND; BS(+) Musculoskeletal:  unchanged Neurological: No new neurological deficits Wounds: N/A    Skin: clear  Aging changes Mental state: Alert, oriented, cooperative Reading a book    Lab Results: BMET    Component Value Date/Time   NA 135* 07/28/2013 0500   K 4.7 07/28/2013 0500   CL 97 07/28/2013 0500   CO2 25 07/28/2013 0500   GLUCOSE 98 07/28/2013 0500   BUN 20 07/28/2013 0500   CREATININE 1.05 07/28/2013 0500   CALCIUM 8.8 07/28/2013 0500   GFRNONAA 61* 07/28/2013 0500   GFRAA 70* 07/28/2013 0500   CBC    Component Value Date/Time   WBC 7.2 07/28/2013 0500   RBC 3.70* 07/28/2013 0500   HGB 12.0* 07/28/2013 0500   HCT 33.3* 07/28/2013 0500   PLT 340 07/28/2013 0500   MCV 90.0 07/28/2013 0500   MCH 32.4 07/28/2013 0500   MCHC 36.0 07/28/2013 0500   RDW 12.8 07/28/2013 0500   LYMPHSABS 1.8 07/28/2013 0500   MONOABS 0.6 07/28/2013 0500   EOSABS 0.1 07/28/2013 0500   BASOSABS 0.0 07/28/2013 0500    Studies/Results: No results  found.  Medications: I have reviewed the patient's current medications.  Assessment/Plan:  1. Deconditioning after multi-medical recent L2 fracture  2. DVT Prophylaxis/Anticoagulation: Patient on Eliquis. Monitor for signs of DVT  3. Pain Management: Lidoderm patch, Tylenol. Monitor the increased mobility  - sports cream for right thigh---consider xr if persistent  -lidocaine patch for back  4. Neuropsych: This patient is capable of making decisions on his own behalf.  5. Atrial fibrillation/incessant tachycardia. Continue amiodarone as well as Lopressor will reduce the dose due to brady.  -occasional stamina issues---may be due HR control of meds which don't allow his HR to increase enough with exercise  6. Hypertension. Continue with lisinopril as well as hydrochlorothiazide. His Toprol was held due to bradycardia.  Monitor with increased mobility in  7. History of urinary retention. Continue Flomax and proscar. pvr's have been elevated still requiring daily cath  -increased flomax to bid  -continue OOB to void, double voids, etc  -ua neg, cx negative  -discussed fluid rationing, avoiding intake in the evening  -pvr's are still up but better  8. Hyperlipidemia. Zocor   Cont Rx     Length of stay, days: 10  Sonda PrimesAlex Shalin Joseph , MD 08/06/2013, 9:28 AM

## 2013-08-06 NOTE — Progress Notes (Signed)
3 stools in past 12 hours, patient reports each stool getting looser. ? DC scheduled miralax? At 2319, voided and PVR=303.  At 0440, voided 200 cc's, PVR=328, no I & O cath at this time. [facility] lower [facility] pain. Right groin with yeast like rash. Alfredo MartinezMurray, Keyarah Mcroy A

## 2013-08-07 ENCOUNTER — Inpatient Hospital Stay (HOSPITAL_COMMUNITY): Payer: Medicare Other

## 2013-08-07 ENCOUNTER — Inpatient Hospital Stay (HOSPITAL_COMMUNITY): Payer: Medicare Other | Admitting: Physical Therapy

## 2013-08-07 DIAGNOSIS — S32009A Unspecified fracture of unspecified lumbar vertebra, initial encounter for closed fracture: Secondary | ICD-10-CM

## 2013-08-07 DIAGNOSIS — W19XXXA Unspecified fall, initial encounter: Secondary | ICD-10-CM

## 2013-08-07 DIAGNOSIS — R5381 Other malaise: Secondary | ICD-10-CM

## 2013-08-07 MED ORDER — TAMSULOSIN HCL 0.4 MG PO CAPS
0.4000 mg | ORAL_CAPSULE | Freq: Every day | ORAL | Status: DC
  Administered 2013-08-08 – 2013-08-09 (×2): 0.4 mg via ORAL
  Filled 2013-08-07 (×3): qty 1

## 2013-08-07 NOTE — Progress Notes (Signed)
Physical Therapy Session Note  Patient Details  Name: Mark Joseph MRN: [id number] Date of Birth: 11/27/1921  Today's Date: 08/07/2013 Time: 0900-1000 Time Calculation (min): 60 min  Skilled Therapeutic Interventions/Progress Updates:    Session focused on family ed (with wife). Ambulation x 200' with RW with wife providing light min-guard assist, intermittent cues from PT to stay close to pt. Car transfer x 3 reps for retention with wife practicing w/c set-up/breakdown, equipment positioning, caregiver positioning. Wife somewhat anxious overall - has difficulty recalling steps. Continued practice will be beneficial.   Therapy Documentation Precautions:  Precautions Precautions: Fall;Back Precaution Booklet Issued: No Precaution Comments: Becomes tachy with activity (per chart review).  Restrictions Weight Bearing Restrictions: No Pain: No c/o  See FIM for current functional status  Therapy/Group: Individual Therapy  [name], Mark Joseph 08/07/2013, 12:27 PM

## 2013-08-07 NOTE — Progress Notes (Signed)
Occupational Therapy Session Note  Patient Details  Name: Mark Joseph MRN: [id number] Date of Birth: 07/12/1921  Today's Date: 08/07/2013 Time: 0730-0830 Time Calculation (min): 60 min  Short Term Goals: Week 2:  OT Short Term Goal 1 (Week 2): STG=LTG due to planned discharge within 6 days  Skilled Therapeutic Interventions/Progress Updates: ADL-retraining with focus on improved safety awareness during funcioinal mobility and transfers, sequencing, problem-solving and dynamic standing balance.   Patient received in bed and conversing with MD regarding need for catheterizations to improve voiding urine.   Patient receptive for ADL but required verbal cues to progress through activity due to delayed processing of instructions.  Patient now has established routine for bathing and dressing but remains unsteady while ambulating and retrieving clothing items.   Setup assist is required for safety and supervision during transfers to use grab bars and for safe positioning prior to transfer as patient initiates transfers too early from unsafe distances.   Patient also reports that he is currently unconvinced that urinary retention is a new problem and he stated that he felt he had been "living with it for some time."   OT reinforced need for complete emptying of bladder and adaptations with use of leg bag with use of Foley catheter.     Therapy Documentation Precautions:  Precautions Precautions: Fall;Back Precaution Booklet Issued: No Precaution Comments: Becomes tachy with activity (per chart review).  Restrictions Weight Bearing Restrictions: No  Vital Signs: Therapy Vitals Temp: 97.5 F (36.4 C) Temp src: Oral Pulse Rate: 58 Resp: 18 BP: 128/79 mmHg Patient Position, if appropriate: Lying Oxygen Therapy SpO2: 98 % O2 Device: None (Room air)  Pain: Pain Assessment Pain Assessment: No/denies pain  ADL: ADL ADL Comments: see FIM  See FIM for current functional  status  Therapy/Group: Individual Therapy  Second session: Time: 1345-1415 Time Calculation (min):  30 min  Pain Assessment: No/denies pain  Skilled Therapeutic Interventions: Therapeutic exercise with focus on HEP, family ed on supervision of HEP, and discharge planning.   Patient received in his recliner, partially asleep.   OT educated pt/spouse on need to perform HEP as instructed as patient admits that he did not use HEP during the weekend.   Patient required 1:1 supervision for performance of exercises even with graphic and written literature provided.   Patient states that several exercises were similar to those he used during enrollment in [employer] course.   Patient completed 10 reps of each exercise, as instructed; spouse stated she would insure improved compliance s/p discharge.   Patient and spouse report that ramp was successfully installed and patient now plans to use w/c for safe mobility as needed within their home.  See FIM for current functional status  Therapy/Group: Individual Therapy  Allana Shrestha 08/07/2013, 9:56 AM

## 2013-08-07 NOTE — Progress Notes (Signed)
Complains of chronic lower back pain, PRN tylenol given at 2207 & 0245. Up to void at 2143, PVR=515, I & O cath=500cc's. At 0249, up to void, PVR=446, I & O cath =500. No BM's tonight. Yeast like rash to right groin. Mark MartinezMurray, Allysen Lazo A

## 2013-08-07 NOTE — Progress Notes (Signed)
Subjective/Complaints: Voiding but requiring caths for PVRs from 450-700 cc  A 12 point review of systems has been performed and if not noted above is otherwise negative.   Objective: Vital Signs: Blood pressure 128/79, pulse 58, temperature 97.5 F (36.4 C), temperature source Oral, resp. rate 18, height 5\' 11"  (1.803 m), weight 67.2 kg (148 lb 2.4 oz), SpO2 98.00%. No results found. No results found for this basename: WBC, HGB, HCT, PLT,  in the last 72 hours No results found for this basename: NA, K, CL, CO, GLUCOSE, BUN, CREATININE, CALCIUM,  in the last 72 hours CBG (last 3)  No results found for this basename: GLUCAP,  in the last 72 hours  Wt Readings from Last 3 Encounters:  08/07/13 67.2 kg (148 lb 2.4 oz)  07/27/13 69.6 kg (153 lb 7 oz)  10/28/12 70.489 kg (155 lb 6.4 oz)    Physical Exam:  Constitutional: He is oriented to person, place, and time.  Frail appearing  HENT:  Head: Normocephalic.  Eyes: EOM are normal.  Neck: Normal range of motion. Neck supple. No thyromegaly present.  Cardiovascular:  Cardiac rate is controlled and regular. No murmurs Respiratory: Effort normal and breath sounds normal. No respiratory distress. Chest is clear GI: Soft. Bowel sounds are normal. He exhibits no distension.  Genitourinary:  Neurological: He is alert and oriented to person, place, and time. HOH Patient pleasant and cooperative with exam. He followed commands. A little impulsive. Strength 4/5 in BUE and 4- to 4 BLE. Sensory exam grossly intact. Fair insight and awareness.  Musc: minimal pain with palpation right thigh-  Has muscle wasting BLE. Low back minimally tender Skin: Skin is warm and dry    Assessment/Plan: 1. Functional deficits secondary to deconditioning related to multiple medical issues which require 3+ hours per day of interdisciplinary therapy in a comprehensive inpatient rehab setting. Physiatrist is providing close team supervision and 24 hour  management of active medical problems listed below. Physiatrist and rehab team continue to assess barriers to discharge/monitor patient progress toward functional and medical goals. FIM: FIM - Bathing Bathing Steps Patient Completed: Chest;Right Arm;Left Arm;Abdomen;Front perineal area;Buttocks;Right upper leg;Left upper leg;Right lower leg (including foot);Left lower leg (including foot) Bathing: 5: Supervision: Safety issues/verbal cues  FIM - Upper Body Dressing/Undressing Upper body dressing/undressing steps patient completed: Thread/unthread right sleeve of pullover shirt/dresss;Thread/unthread left sleeve of pullover shirt/dress;Put head through opening of pull over shirt/dress;Pull shirt over trunk;Thread/unthread right sleeve of front closure shirt/dress;Thread/unthread left sleeve of front closure shirt/dress;Pull shirt around back of front closure shirt/dress;Button/unbutton shirt Upper body dressing/undressing: 5: Set-up assist to: Obtain clothing/put away FIM - Lower Body Dressing/Undressing Lower body dressing/undressing steps patient completed: Thread/unthread right underwear leg;Thread/unthread left underwear leg;Pull underwear up/down;Thread/unthread right pants leg;Thread/unthread left pants leg;Pull pants up/down;Fasten/unfasten pants;Don/Doff right sock;Don/Doff left sock;Don/Doff right shoe;Don/Doff left shoe;Fasten/unfasten right shoe;Fasten/unfasten left shoe Lower body dressing/undressing: 5: Set-up assist to: Obtain clothing  FIM - Toileting Toileting steps completed by patient: Adjust clothing prior to toileting Toileting Assistive Devices: Grab bar or rail for support Toileting: 7: Independent: No helper, no device  FIM - Diplomatic Services operational officer Devices: Art gallery manager Transfers: 5-To toilet/BSC: Supervision (verbal cues/safety issues);5-From toilet/BSC: Supervision (verbal cues/safety issues)  FIM - Banker  Devices: Walker;Bed rails Bed/Chair Transfer: 7: Supine > Sit: No assist;5: Bed > Chair or W/C: Supervision (verbal cues/safety issues);5: Chair or W/C > Bed: Supervision (verbal cues/safety issues)  FIM - Locomotion:  Wheelchair Distance: 45' Locomotion: Wheelchair: 5: Travels 150 ft or more: maneuvers on rugs and over door sills with supervision, cueing or coaxing FIM - Locomotion: Ambulation Locomotion: Ambulation Assistive Devices: Designer, industrial/productWalker - Rolling Ambulation/Gait Assistance: 4: Min guard Locomotion: Ambulation: 2: Travels 50 - 149 ft with minimal assistance (Pt.>75%)  Comprehension Comprehension Mode: Auditory Comprehension: 5-Understands complex 90% of the time/Cues < 10% of the time  Expression Expression Mode: Verbal Expression: 5-Expresses basic 90% of the time/requires cueing < 10% of the time.  Social Interaction Social Interaction Mode: Asleep Social Interaction: 6-Interacts appropriately with others with medication or extra time (anti-anxiety, antidepressant).  Problem Solving Problem Solving: 5-Solves basic 90% of the time/requires cueing < 10% of the time  Memory Memory: 5-Requires cues to use assistive device  Medical Problem List and Plan:  1. Deconditioning after multi-medical recent L2 fracture  2. DVT Prophylaxis/Anticoagulation: Patient on Eliquis. Monitor for signs of DVT  3. Pain Management: Lidoderm patch, Tylenol. Monitor the increased mobility   - sports cream for right thigh---consider xr if persistent  -lidocaine patch for back 4. Neuropsych: This patient is capable of making decisions on his own behalf.  5. Atrial fibrillation/incessant tachycardia. Continue amiodarone as well as Lopressor.   -occasional stamina issues---may be due HR control of meds which don't allow his HR to increase enough with exercise 6. Hypertension. Continue with lisinopril as well as hydrochlorothiazide with Lopressor 50 mg twice a day. Monitor with increased mobility in  7.  History of urinary retention. Continue Flomax and proscar. pvr's have been elevated still requiring daily cath  -will re-insert foley cath and contact urology to arrange outpt follow up. He says he's seen [facility]--don't see a name in the computer however  -reduce flomax back to daily  -ua neg, cx negative 8. Hyperlipidemia. Zocor   LOS (Days) 11 A FACE TO FACE EVALUATION WAS PERFORMED  Laray Corbit T 08/07/2013 8:26 AM

## 2013-08-07 NOTE — Progress Notes (Signed)
Physical Therapy Session Note  Patient Details  Name: Ashok CroonLeon A Mangano MRN: [id number] Date of Birth: 06/10/1921  Today's Date: 08/07/2013 Time: [id number] Time Calculation (min): 43 min   Skilled Therapeutic Interventions/Progress Updates:    Session focused on family education with pt and pt's wife in regards to basic transfers with RW, catheter management with all mobility, bed mobility in ADL apartment and simulated home set-up in gym with 6" step (28" bed height), gait with RW on unit and in home environment with pt's wife demonstrating correct guarding techniques, and education/return demonstration for w/c parts management and breakdown (wife will require reinforcement with folding of w/c). Therapist changed w/c size to 16x16 which is closer to what they will be going home with (primary PT stated 16 x 18) to practice, but this chair does not fold easily so wife may need to practice on different w/c. Wife displays and verbalizes decreased memory with "all the steps" but able to safely return demonstrate all transfers and guarding techniques during this session with extra time and encouragement.  Therapy Documentation Precautions:  Precautions Precautions: Fall;Back Precaution Booklet Issued: No Precaution Comments: Becomes tachy with activity (per chart review).  Restrictions Weight Bearing Restrictions: No  Pain:  Denies pain - reports he still has some discomfort in RLE. Rest breaks as needed.  See FIM for current functional status  Therapy/Group: Individual Therapy  Karolee StampsGray, Callia Swim [facility] 08/07/2013, 11:51 AM

## 2013-08-08 ENCOUNTER — Inpatient Hospital Stay (HOSPITAL_COMMUNITY): Payer: Medicare Other | Admitting: *Deleted

## 2013-08-08 ENCOUNTER — Inpatient Hospital Stay (HOSPITAL_COMMUNITY): Payer: Medicare Other | Admitting: Physical Therapy

## 2013-08-08 ENCOUNTER — Inpatient Hospital Stay (HOSPITAL_COMMUNITY): Payer: Medicare Other

## 2013-08-08 DIAGNOSIS — W19XXXA Unspecified fall, initial encounter: Secondary | ICD-10-CM

## 2013-08-08 DIAGNOSIS — R5381 Other malaise: Secondary | ICD-10-CM

## 2013-08-08 DIAGNOSIS — S32009A Unspecified fracture of unspecified lumbar vertebra, initial encounter for closed fracture: Secondary | ICD-10-CM

## 2013-08-08 NOTE — Progress Notes (Signed)
Subjective/Complaints: Had a much better night not having to worry about voiding.  A 12 point review of systems has been performed and if not noted above is otherwise negative.   Objective: Vital Signs: Blood pressure 143/65, pulse 55, temperature 98 F (36.7 C), temperature source Oral, resp. rate 18, height 5\' 11"  (1.803 m), weight 65.772 kg (145 lb), SpO2 98.00%. No results found. No results found for this basename: WBC, HGB, HCT, PLT,  in the last 72 hours No results found for this basename: NA, K, CL, CO, GLUCOSE, BUN, CREATININE, CALCIUM,  in the last 72 hours CBG (last 3)  No results found for this basename: GLUCAP,  in the last 72 hours  Wt Readings from Last 3 Encounters:  08/08/13 65.772 kg (145 lb)  07/27/13 69.6 kg (153 lb 7 oz)  10/28/12 70.489 kg (155 lb 6.4 oz)    Physical Exam:  Constitutional: He is oriented to person, place, and time.  Frail appearing  HENT:  Head: Normocephalic.  Eyes: EOM are normal.  Neck: Normal range of motion. Neck supple. No thyromegaly present.  Cardiovascular:  Cardiac rate is controlled and regular. No murmurs Respiratory: Effort normal and breath sounds normal. No respiratory distress. Chest is clear GI: Soft. Bowel sounds are normal. He exhibits no distension.  Genitourinary:  Neurological: He is alert and oriented to person, place, and time. HOH Patient pleasant and cooperative with exam. He followed commands. A little impulsive. Strength 4/5 in BUE and 4- to 4 BLE. Sensory exam grossly intact. Fair insight and awareness.  Musc: minimal pain with palpation right thigh-  Has muscle wasting BLE. Low back minimally tender Skin: Skin is warm and dry    Assessment/Plan: 1. Functional deficits secondary to deconditioning related to multiple medical issues which require 3+ hours per day of interdisciplinary therapy in a comprehensive inpatient rehab setting. Physiatrist is providing close team supervision and 24 hour  management of active medical problems listed below. Physiatrist and rehab team continue to assess barriers to discharge/monitor patient progress toward functional and medical goals.  Dc tomorrow   FIM: FIM - Bathing Bathing Steps Patient Completed: Chest;Right Arm;Left Arm;Abdomen;Front perineal area;Buttocks;Right upper leg;Left upper leg;Right lower leg (including foot);Left lower leg (including foot) Bathing: 5: Supervision: Safety issues/verbal cues  FIM - Upper Body Dressing/Undressing Upper body dressing/undressing steps patient completed: Thread/unthread right sleeve of pullover shirt/dresss;Thread/unthread left sleeve of pullover shirt/dress;Put head through opening of pull over shirt/dress;Pull shirt over trunk;Thread/unthread right sleeve of front closure shirt/dress;Thread/unthread left sleeve of front closure shirt/dress;Pull shirt around back of front closure shirt/dress;Button/unbutton shirt Upper body dressing/undressing: 5: Set-up assist to: Obtain clothing/put away FIM - Lower Body Dressing/Undressing Lower body dressing/undressing steps patient completed: Thread/unthread right underwear leg;Thread/unthread left underwear leg;Pull underwear up/down;Thread/unthread right pants leg;Thread/unthread left pants leg;Pull pants up/down;Fasten/unfasten pants;Don/Doff right sock;Don/Doff left sock;Don/Doff right shoe;Don/Doff left shoe;Fasten/unfasten right shoe;Fasten/unfasten left shoe Lower body dressing/undressing: 5: Set-up assist to: Obtain clothing  FIM - Toileting Toileting steps completed by patient: Adjust clothing prior to toileting Toileting Assistive Devices: Grab bar or rail for support Toileting: 7: Independent: No helper, no device  FIM - Diplomatic Services operational officerToilet Transfers Toilet Transfers Assistive Devices: Best boyWalker;Grab bars Toilet Transfers: 5-To toilet/BSC: Supervision (verbal cues/safety issues)  FIM - BankerBed/Chair Transfer Bed/Chair Transfer Assistive Devices: Diplomatic Services operational officerWalker Bed/Chair  Transfer: 5: Supine > Sit: Supervision (verbal cues/safety issues)  FIM - Locomotion: Wheelchair Distance: 45' Locomotion: Wheelchair: 5: Travels 150 ft or more: maneuvers on rugs and over door sills  with supervision, cueing or coaxing FIM - Locomotion: Ambulation Locomotion: Ambulation Assistive Devices: Walker - Rolling Ambulation/Gait Assistance: 4: Min guard Locomotion: Ambulation: 4: Travels 150 ft or more with minimal assistance (Pt.>75%)  Comprehension Comprehension Mode: Auditory Comprehension: 5-Understands complex 90% of the time/Cues < 10% of the time  Expression Expression Mode: Verbal Expression: 5-Expresses complex 90% of the time/cues < 10% of the time  Social Interaction Social Interaction Mode: Asleep Social Interaction: 6-Interacts appropriately with others with medication or extra time (anti-anxiety, antidepressant).  Problem Solving Problem Solving: 5-Solves complex 90% of the time/cues < 10% of the time  Memory Memory: 6-More than reasonable amt of time  Medical Problem List and Plan:  1. Deconditioning after multi-medical recent L2 fracture  2. DVT Prophylaxis/Anticoagulation: Patient on Eliquis. Monitor for signs of DVT  3. Pain Management: Lidoderm patch, Tylenol. Monitor the increased mobility   - sports cream for right thigh---consider xr if persistent  -lidocaine patch for back very beneficial 4. Neuropsych: This patient is capable of making decisions on his own behalf.  5. Atrial fibrillation/incessant tachycardia. Continue amiodarone as well as Lopressor.  6. Hypertension. Continue with lisinopril as well as hydrochlorothiazide with Lopressor 50 mg twice a day.  7. History of urinary retention. Continue Flomax and proscar. pvr's have been elevated still requiring daily cath  -foley replaced. He sees urology next week  -reduce flomax back to daily  -ua neg, cx negative 8. Hyperlipidemia. Zocor   LOS (Days) 12 A FACE TO FACE EVALUATION WAS  PERFORMED  Brieann Osinski T 08/08/2013 8:13 AM

## 2013-08-08 NOTE — Discharge Instructions (Signed)
Inpatient Rehab Discharge Instructions  Ashok CroonLeon A Swingle Discharge date and time: No discharge date for patient encounter.   Activities/Precautions/ Functional Status: Activity: activity as tolerated Diet: regular diet Wound Care: none needed Functional status:  ___ No restrictions     ___ Walk up steps independently __x_ 24/7 supervision/assistance   ___ Walk up steps with assistance ___ Intermittent supervision/assistance  ___ Bathe/dress independently ___ Walk with walker     ___ Bathe/dress with assistance ___ Walk Independently    ___ Shower independently __x_ Walk with assistance    ___ Shower with assistance ___ No alcohol     ___ Return to work/school ________   COMMUNITY REFERRALS UPON DISCHARGE:    Home Health:   PT    OT     RN                     Agency: [facility] Phone: [phone]   Medical Equipment/Items Ordered: wheelchair, cushion                                                    Agency/Supplier: [facility] @ [phone]       Special Instructions: Continued amiodarone 200 mg twice daily until 08/23/2013 and then began 200 mg daily   My questions have been answered and I understand these instructions. I will adhere to these goals and the provided educational materials after my discharge from the hospital.  Patient/Caregiver Signature _______________________________ Date __________  Clinician Signature _______________________________________ Date __________  Please bring this form and your medication list with you to all your follow-up doctor's appointments.

## 2013-08-08 NOTE — Progress Notes (Signed)
Physical Therapy Discharge Summary  Patient Details  Name: Mark Joseph MRN: [id number] Date of Birth: 07-20-1921  Today's Date: 08/08/2013 Time: 1330-1430 Time Calculation (min): 60 min  First Session Skilled Therapeutic Interventions/Progress Updates:  Session focused on family education (wife) during long distance ambulation x 400' with RW, wife provided supervision appropriately. Family friend available to provide reinforcement to wife. Wife had questions about medications and catheter - directed questions to MD and RN.   Second Session Time:  1330-1430 Time Calculation (min): 60 min Skilled Therapeutic Interventions/Progress Updates:  Session again focused on family ed: wife providing supervision for car transfer (with w/c set up/break down), ramp x 2, curb x 2, transfer training to/from w/c, home mobility and sit <> stands from low chair without armrests.Wife needed min cues for positioning. PT reiterated complete supervision with all standing activity at home, wife and pt verbalized understanding. Wife and pt report no further questions.   Pt mod I with w/c propulsion using bil LEs.   Patient has met 10 of 11 long term goals due to improved activity tolerance, improved balance, increased strength, decreased pain and ability to compensate for deficits.  Patient to discharge at an ambulatory level Supervision.   Patient's care partner is independent to provide the necessary physical assistance at discharge.  Reasons goals not met: Pt did not meet stair goal of supervision, he now has a rental ramp at home and therefore stairs is not a barrier to home entry.   Recommendation:  Patient will benefit from ongoing skilled PT services in home health setting to continue to advance safe functional mobility, address ongoing impairments in impaired dynamic balance, safety evaluation in the home, management of stairs (ramp is temporary), generalized strengthening and conditioning program, and  minimize fall risk.  Equipment: W/C  Reasons for discharge: treatment goals met and discharge from hospital  Patient/family agrees with progress made and goals achieved: Yes  PT Discharge Precautions/Restrictions Precautions Precautions: Fall Precaution Comments: Per PA be cautious with twisting but pt does not require standard back precautions Restrictions Weight Bearing Restrictions: No Pain Pain Assessment Pain Assessment: 0-10 Pain Score: 6  Pain Type: Acute pain Pain Location: Leg Pain Orientation: Right Pain Descriptors / Indicators: Aching Pain Onset: With Activity Pain Intervention(s): Medication (See eMAR) Vision/Perception  Vision - History Baseline Vision: Wears glasses only for reading Patient Visual Report: No change from baseline Vision - Assessment Eye Alignment: Within Functional Limits Perception Perception: Within Functional Limits Praxis Praxis: Intact  Cognition Overall Cognitive Status: Within Functional Limits for tasks assessed Arousal/Alertness: Awake/alert Orientation Level: Oriented X4 Attention: Alternating Sustained Attention: Appears intact Alternating Attention: Impaired Alternating Attention Impairment: Functional complex Memory: Impaired Memory Impairment: Decreased recall of new information;Decreased short term memory Decreased Short Term Memory: Functional complex Awareness: Appears intact Awareness Impairment: Anticipatory impairment Problem Solving: Impaired Problem Solving Impairment: Functional complex Organizing: Impaired Organizing Impairment: Functional complex Decision Making: Impaired Decision Making Impairment: Functional complex Safety/Judgment: Impaired Comments: continues to require supervision for safety awareness during dynamic standing tasks Sensation Sensation Light Touch: Appears Intact (bil LEs) Stereognosis: Appears Intact Hot/Cold: Appears Intact Proprioception: Appears Intact Coordination Gross  Motor Movements are Fluid and Coordinated: Yes Fine Motor Movements are Fluid and Coordinated: Yes Motor  Motor Motor: Within Functional Limits  Mobility Bed Mobility Bed Mobility: Supine to Sit;Sit to Supine Rolling Left: 5: Supervision Left Sidelying to Sit: 5: Supervision Supine to Sit: 5: Supervision Sit to Supine: 5: Supervision Transfers Transfers: Yes Sit to Stand: 5: Supervision Sit to  Stand Details: Verbal cues for precautions/safety Sit to Stand Details (indicate cue type and reason): Supervision for safety (locking brakes on w/c, management of catheter) Stand to Sit: 5: Supervision Stand to Sit Details (indicate cue type and reason): Verbal cues for precautions/safety Stand to Sit Details: Supervision for safety, cues for squaring to support surface prior to sitting.  Stand Pivot Transfers: 5: Supervision Locomotion  Ambulation Ambulation: Yes Ambulation/Gait Assistance: 5: Supervision Ambulation Distance (Feet): 400 Feet Assistive device: Rolling walker Ambulation/Gait Assistance Details: Supervision for safety due to falls risk.  Gait Gait: Yes Gait Pattern: Impaired Gait Pattern: Step-through pattern;Decreased stride length;Trunk flexed Stairs / Additional Locomotion Stairs: Yes Stairs Assistance: 4: Min assist Stairs Assistance Details (indicate cue type and reason): min-guard assist, pt requires bil railing which is not available at home Stair Management Technique: Two rails;Forwards Number of Stairs: 5 Ramp: 5: Supervision Curb: 4: Min Chemical engineer: Yes Wheelchair Assistance: 6: Modified independent (Device/Increase time) Environmental health practitioner: Both lower extermities Distance: 150'  Trunk/Postural Assessment  Cervical Assessment Cervical Assessment: Within Functional Limits Cervical AROM Overall Cervical AROM Comments: forward head posture Thoracic Assessment Thoracic Assessment: Exceptions to [facility] Thoracic  Strength Overall Thoracic Strength Comments: kyphotic Lumbar Assessment Lumbar Assessment: Exceptions to [facility] Lumbar Strength Overall Lumbar Strength Comments: posterior pelvic tilt - loss of lumbar curve Postural Control Postural Control: Within Functional Limits Righting Reactions: still impaired, needs RW however much improved from eval.   Balance Static Sitting Balance Static Sitting - Balance Support: Feet supported Static Sitting - Level of Assistance: 6: Modified independent (Device/Increase time) Static Standing Balance Static Standing - Balance Support: Bilateral upper extremity supported Static Standing - Level of Assistance: 5: Stand by assistance Extremity Assessment  RUE Assessment RUE Assessment: Within Functional Limits LUE Assessment LUE Assessment: Within Functional Limits RLE Assessment RLE Assessment: Exceptions to [facility] RLE AROM (degrees) RLE Overall AROM Comments: Decreased knee extension, limited by pain reports h/o "bone spurs" RLE Strength RLE Overall Strength Comments: 3+/5 within range, limited by pain LLE Assessment LLE Assessment: Exceptions to [facility] LLE AROM (degrees) LLE Overall AROM Comments: WFL LLE Strength LLE Overall Strength Comments: Generalized deconditioning grossly >/=4-/5  See FIM for current functional status  Lahoma Rocker 08/08/2013, 3:38 PM

## 2013-08-08 NOTE — Progress Notes (Signed)
Physical Therapy Session Note  Patient Details  Name: Mark CroonLeon A Mccoy MRN: [id number] Date of Birth: 04/24/1922  Today's Date: 08/08/2013 Time: 1002-1100 Time Calculation (min): 58 min  Short Term Goals: Week 1:  PT Short Term Goal 1 (Week 1): =LTGS  Skilled Therapeutic Interventions/Progress Updates:  Tx focused on family/caregiver training for all aspects of home safety and mobility. Family friend present with wife to assist and provide supervision cues for safety.  Caregiver training included basic transfers with RW, car transfers with RW, bed transfers and logrolling, WC management and breakdown, ramp with RW and curb step. Wife was able to return-demo transfers and mobility, but did continue to need cues for positioning and safety, as well as cath bag management. Family friend was able to recall techniques and assist her appropriately. Pt performed all mobility with min-guard A.     Therapy Documentation Precautions:  Precautions Precautions: Fall;Back Precaution Booklet Issued: No Precaution Comments: Becomes tachy with activity (per chart review).  Restrictions Weight Bearing Restrictions: No    Pain: Pt c/o R quad soreness, but did not require modification of tx  Locomotion : Ambulation Ambulation/Gait Assistance: 4: Min guard Wheelchair Mobility Distance: 150   See FIM for current functional status  Therapy/Group: Individual Therapy Clydene Lamingole Latifah Padin, PT, DPT   08/08/2013, 12:08 PM

## 2013-08-08 NOTE — Plan of Care (Signed)
Problem: RH BLADDER ELIMINATION Goal: RH STG MANAGE BLADDER WITH EQUIPMENT WITH ASSISTANCE STG Manage Bladder With Equipment With max Assistance  Outcome: Progressing Foley catheter inserted 3/30

## 2013-08-08 NOTE — Discharge Summary (Signed)
NAMEWALKER, SITAR                  ACCOUNT NO.:  [id number]  MEDICAL RECORD NO.:  [id number]  LOCATION:  4M10C                        FACILITY:  [facility]  PHYSICIAN:  Ranelle Oyster, M.D.DATE OF BIRTH:  1921/05/16  DATE OF ADMISSION:  07/27/2013 DATE OF DISCHARGE:  08/09/2013                              DISCHARGE SUMMARY   DISCHARGE DIAGNOSES: 1. Deconditioning after multi-medical recent lumbar L2 fracture. 2. Eliquis for deep venous thrombosis prophylaxis. 3. Pain management. 4. Atrial fibrillation with tachycardia. 5. Hypertension. 6. History of urinary retention. 7. Hyperlipidemia.  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  Presented to [facility] on July 22, 2013, with constipation, abdominal pain, and incidental finding of new-onset of atrial fibrillation with RVR.  Cardiology Services consulted, placed on Cardizem drip.  Echocardiogram with grade 1 diastolic dysfunction.  Systolic function mildly-to-moderately reduced.  Placed on Eliquis for atrial fibrillation.  Cardiac rate controlled.  Bouts of urinary retention, required Foley catheter, maintained on Flomax as well as Proscar.  Physical and occupational therapy going.  The patient was admitted for comprehensive rehab program.  PAST MEDICAL HISTORY:  See discharge diagnoses.  SOCIAL HISTORY:  Lives with spouse.  FUNCTIONAL HISTORY:  Prior to admission, independent until 1 week prior. He was still driving, going to the [facility] daily.  FUNCTIONAL STATUS:  Upon admission to [facility], was ambulating 100 feet, decreased gait, velocity, needing assistance sit to stand.  PHYSICAL EXAMINATION:  VITAL SIGNS:  Blood pressure 123/74, pulse 54, temperature 98.6, respirations 20. GENERAL:  This was a pleasant  male, cooperative, followed commands.  He was a bit impulsive, fair insight and awareness to his deficits. HEENT:  Pupils, round and reactive to light. LUNGS:  Clear to auscultation. CARDIAC:  Rate controlled. ABDOMEN:  Soft, nontender.  Good bowel sounds.  Foley catheter tube in place.  REHABILITATION HOSPITAL COURSE:  The patient was admitted to Inpatient Rehab Services with therapies initiated on a 3-hour daily basis consisting of physical therapy, occupational therapy, and rehabilitation nursing, and following issues were addressed during the patient's rehabilitation stay.  Pertaining to Mr. Force deconditioning after multi- medical recent L2 compression fracture remained stable, he was using a Lidoderm patch for pain.  He remained on Eliquis as well as amiodarone for atrial fibrillation, RVR, with no chest pain or shortness of breath and would follow up with Cardiology Services. His amiodarone to be decreased to 200 mg daily 08/23/2013. His blood pressures remained well controlled on lisinopril, Lopressor as well as hydrochlorothiazide.  He did have a history of urinary retention.  Foley catheter tube had been removed for voiding trial.  He remained on Flomax as well as Proscar. He continued to have some elevated postvoid residuals.  Urinalysis study was negative.  Foley catheter tube was reinserted.  He had seen Urology Services in the past and plan to follow up with Dr. Berneice Heinrich of Urology Services as an outpatient.  All issues in regard to this were discussed with family.  The  patient received weekly collaborative interdisciplinary team conferences to discuss estimated length of stay, family teaching, and any barriers to discharge.  He was ambulating 200 feet with a rolling walker with wife providing light minimal assistance and intermittent cues.  Endurance and strength continued to improve. Activities of daily living focused on safety awareness during functional mobility and  transfers.  Setup assistance required for activities of daily living.  Full family teaching was completed and plan was to be discharged to home.  DISCHARGE MEDICATIONS: 1. Amiodarone 200 mg p.o. B.i.d.until 08/23/2013 then 200mg  daily. 2. Eliquis 5 mg p.o. b.i.d. 3. Proscar 5 mg p.o. daily. 4. Lidoderm patch, change every 12 hours. 5. Lisinopril 10 mg p.o. daily. 6. Hydrochlorothiazide 12.5 mg p.o. daily. 7. Lopressor 12.5 mg p.o. b.i.d. 8. Protonix 40 mg p.o. daily. 9. Selenium 200 mcg p.o. daily. 10.Zocor 10 mg p.o. daily. 11.Flomax 0.4 mg p.o. daily.  DIET:  Regular.  SPECIAL INSTRUCTIONS:  The patient would follow up with Dr. Faith RogueZachary Geralyn Figiel at the Outpatient Rehab Service Office as needed; Dr. Lewayne BuntingGregg Taylor, Cardiology Services, call for appointment; Dr. Sebastian Acheheodore Manny, on August 15, 2013 to address urinary retention; Dr. Kirby FunkJohn Griffin, medical management.  Foley catheter tube in place until follow up Urology Services for voiding trial.     Mariam Dollaraniel Angiulli, P.A.   ______________________________ Ranelle OysterZachary T. Olliver Boyadjian, M.D.    DA/MEDQ  D:  08/08/2013  T:  08/08/2013  Job:  [id number]  cc:   Sebastian Acheheodore Manny, MD Doylene CanningGregg W. Ladona Ridgelaylor, MD Thora LanceJohn J. Griffin, M.D.

## 2013-08-08 NOTE — Progress Notes (Signed)
Occupational Therapy Session Note and Discharge Summary  Patient Details  Name: Mark Joseph MRN: [id number] Date of Birth: February 17, 1922  Today's Date: 08/08/2013 Time: 1115-1200 Time Calculation (min): 45 min  Patient has met 10 of 11 long term goals due to improved activity tolerance, improved balance, ability to compensate for deficits and improved attention.  Patient to discharge at overall Supervision level.  Patient's care partner is independent to provide the necessary physical and cognitive assistance at discharge.    Reasons goals not met: Patient reported plan which included desire to complete light meal prep (making pop-overs) but relied on spouse to gather cookware from home specifically needed for task.   No follow-through from spouse despite repeated prompts and cues by OT throughout patient's admission.  Recommendation:  Patient will benefit from ongoing skilled PT services in home health setting to continue to advance functional skills in the area of general endurance and functional mobiltiy using LRAD.Marland Kitchen  Equipment: No equipment provided  Reasons for discharge: treatment goals met  Patient/family agrees with progress made and goals achieved: Yes  Skilled Intervention:  ADL-retraining with focus on family education (spouse) on supervision during ADL and functional mobility.   Spouse demonstrated ability to provide setup assist and supervision (verbal cues, contact guard, prn) to reduce risk of fall and to assist with problem-solving during routine tasks.   Patient performed bathing at walk-in shower, sitting and standing, using DME when cued.   Spouse and patient explained why planned meal prep task was delayed and eventually deferred during inpatient rehab stay.   Patient spouse also reiterated that no equipment would be needed, beyond w/c and RW, as they plan to use [facility] in their walk-in shower with spouse providing stabilization of RW during transfers (to [facility] and to toilet).    Patient plans to walk from shower to his room to dress at edge of bed, similar to method used and practiced during this admission.  OT Discharge Precautions/Restrictions  Precautions Precautions: Fall Restrictions Weight Bearing Restrictions: No  Pain Pain Assessment Pain Assessment: No/denies pain  ADL ADL ADL Comments: see FIM  Vision/Perception  Vision - History Baseline Vision: Wears glasses only for reading Patient Visual Report: No change from baseline Vision - Assessment Eye Alignment: Within Functional Limits Perception Perception: Within Functional Limits Praxis Praxis: Intact   Cognition Overall Cognitive Status: Within Functional Limits for tasks assessed Arousal/Alertness: Awake/alert Orientation Level: Oriented X4 Attention: Alternating Sustained Attention: Appears intact Alternating Attention: Impaired Alternating Attention Impairment: Functional complex Memory: Impaired Memory Impairment: Decreased recall of new information Awareness: Appears intact Awareness Impairment: Anticipatory impairment Problem Solving: Impaired Problem Solving Impairment: Functional complex Organizing: Impaired Organizing Impairment: Functional complex Decision Making: Impaired Decision Making Impairment: Functional complex Safety/Judgment: Impaired Comments: continues to require supervision for safety awareness during dynamic standing tasks  Sensation Sensation Light Touch: Appears Intact Stereognosis: Appears Intact Hot/Cold: Appears Intact Proprioception: Appears Intact Coordination Gross Motor Movements are Fluid and Coordinated: Yes Fine Motor Movements are Fluid and Coordinated: Yes  Motor  Motor Motor: Within Functional Limits  Mobility  Bed Mobility Bed Mobility: Supine to Sit;Sit to Supine Rolling Left: 6: Modified independent (Device/Increase time) Left Sidelying to Sit: 6: Modified independent (Device/Increase time) Supine to Sit: 6: Modified  independent (Device/Increase time) Sit to Supine: 6: Modified independent (Device/Increase time) Transfers Transfers: Sit to Stand;Stand to Sit Sit to Stand: 5: Supervision Sit to Stand Details: Verbal cues for precautions/safety Stand to Sit: 5: Supervision Stand to Sit Details (indicate cue type and reason):  Verbal cues for precautions/safety   Trunk/Postural Assessment  Cervical Assessment Cervical Assessment: Within Functional Limits Thoracic Assessment Thoracic Assessment: Exceptions to [facility] Thoracic Strength Overall Thoracic Strength Comments: kyphotic Lumbar Assessment Lumbar Assessment: Exceptions to [facility] Lumbar Strength Overall Lumbar Strength Comments: posterior pelvic tilt - loss of lumbar curve Postural Control Postural Control: Within Functional Limits   Balance Static Sitting Balance Static Sitting - Balance Support: Feet supported Static Sitting - Level of Assistance: 6: Modified independent (Device/Increase time)  Extremity/Trunk Assessment RUE Assessment RUE Assessment: Within Functional Limits LUE Assessment LUE Assessment: Within Functional Limits  See FIM for current functional status  [place] 08/08/2013, 12:52 PM

## 2013-08-08 NOTE — Patient Care Conference (Signed)
Inpatient RehabilitationTeam Conference and Plan of Care Update Date: 08/08/2013   Time: 10:00 AM    Patient Name: Mark Joseph      Medical Record Number: [id number]  Date of Birth: 12/28/1921 Sex: Male         Room/Bed: 4M10C/4M10C-01 Payor Info: Payor: [employer] [place] [employer] / Plan: BLUE MEDICARE / Product Type: *No Product type* /    Admitting Diagnosis: DECONDITIONED, L2 FX  Admit Date/Time:  07/27/2013  2:42 PM Admission Comments: No comment available   Primary Diagnosis:  <principal problem not specified> Principal Problem: <principal problem not specified>  Patient Active Problem List   Diagnosis Date Noted  . Physical deconditioning 07/27/2013  . Atrial fibrillation with RVR 07/22/2013  . UTI (urinary tract infection) 10/12/2012  . S/P bilateral inguinal hernia repair 10/03/2012  . Leukocytosis 10/03/2012  . Hematoma 10/03/2012  . CVA (cerebral infarction) 01/30/2012  . Hypertension 01/30/2012    Expected Discharge Date: Expected Discharge Date: 08/09/13  Team Members Present: Physician leading conference: Dr. Faith RogueZachary Swartz Social Worker Present: Amada JupiterLucy Clarion Mooneyhan, LCSW Nurse Present: Phylliss BobKom Avegno, RN PT Present: Karolee StampsAlison Gray, Jerrye BushyPT;Hannah Gillispie, PT OT Present: Edwin CapPatricia Clay, Loistine ChanceT;Karen Pulaski, OT;Frank New PostBarthold, OT PPS Coordinator present : Edson SnowballBecky Windsor, PT     Current Status/Progress Goal Weekly Team Focus  Medical   urine retention. home with foley and outpt uro follow up  see above   bladder, family ed   Bowel/Bladder   cont of bowel, on miralax, LBM 3/29; foley catheter placed 3/30, on proscar and flomax.  d/c with foley catheter; remain cont of bowel.  monitor for constipation/diarrhea; educate on foley care at home   Swallow/Nutrition/ Hydration             ADL's   Supervision for ADL and transfers  Supervision  UE strengthening, safety awareness, family education   Mobility   supervision with RW  supervision  family education    Communication             Safety/Cognition/ Behavioral Observations            Pain   chronic back pain with lidoderm patch, muscle rub, and PRN tylenol, interventions effective  pain less than 3/10  assess pain qshift and prn   Skin   red yeasty area between R groin and scrotum, microguard powder applied  no infection or skin breakdown while in Rehab  assess skin qshift and prn, keep skin dry    Rehab Goals Patient on target to meet rehab goals: Yes *See Care Plan and progress notes for long and short-term goals.  Barriers to Discharge: bladder, safety    Possible Resolutions to Barriers:  adaptive equipment, foley in blace    Discharge Planning/Teaching Needs:  home with wife to provide 24/7 assistance/ supervision.  family ed completed today      Team Discussion:  Meeting d/c goals and completing family education today.  Ramp in place at home.  Wife with many questions about meds - PA/MD aware and will discuss further.  No concerns.  Revisions to Treatment Plan:  None   Continued Need for Acute Rehabilitation Level of Care: The patient requires daily medical management by a physician with specialized training in physical medicine and rehabilitation for the following conditions: Daily direction of a multidisciplinary physical rehabilitation program to ensure safe treatment while eliciting the highest outcome that is of practical value to the patient.: Yes Daily medical management of patient stability for increased activity during participation  in an intensive rehabilitation regime.: Yes Daily analysis of laboratory values and/or radiology reports with any subsequent need for medication adjustment of medical intervention for : Neurological problems;Post surgical problems;Cardiac problems  [name] 08/08/2013, 1:22 PM

## 2013-08-08 NOTE — Progress Notes (Signed)
Social Work Patient ID: Mark Joseph, male   DOB: [birth year], 80 y.o.   MRN: [id number]  Met with pt and wife following team conference today.  Both feeling ready for tomorrow's d/c.  Discussed f/u and DME.  Wife with questions about meds and diet - reminded her that PA will be meeting with her prior to d/c.  No further questions/ concerns.  Mark Stumpe, LCSW

## 2013-08-08 NOTE — Discharge Summary (Signed)
  Discharge summary job [id number]

## 2013-08-08 NOTE — Plan of Care (Signed)
Problem: Food- and Nutrition-Related Knowledge Deficit (NB-1.1) Goal: Nutrition education Formal process to instruct or train a patient/client in a skill or to impart knowledge to help patients/clients voluntarily manage or modify food choices and eating behavior to maintain or improve health. Outcome: Progressing Nutrition Education Note  RD consulted for nutrition education regarding a Heart Healthy diet.   Lipid Panel     Component Value Date/Time    CHOL 185 01/31/2012 0313    TRIG 72 01/31/2012 0313    HDL 67 01/31/2012 0313    CHOLHDL 2.8 01/31/2012 0313    VLDL 14 01/31/2012 0313    LDLCALC 104* 01/31/2012 0313    Patient and wife with questions related to a low sodium diet. Reviewed patient's dietary recall. Provided examples on ways to decrease sodium and fat intake in diet. Discouraged intake of processed foods and use of salt shaker. Wife is encouraging toward pt, recommending he try to decrease sodium, however she and pt admit that compliance will be difficult.  Encouraged fresh fruits and vegetables as well as whole grain sources of carbohydrates to maximize fiber intake. Discussed strategies to improve taste of food without use of salt. Teach back method used.  Expect good compliance.  Body mass index is 20.23 kg/(m^2). Pt meets criteria for WNL based on current BMI.  Current diet order is Heart Healthy, patient is consuming approximately 80-100% of meals at this time. Labs and medications reviewed. No further nutrition interventions warranted at this time. RD contact information provided. If additional nutrition issues arise, please re-consult RD.  Loyce DysKacie Geonna Lockyer, MS RD LDN Clinical Inpatient Dietitian Pager: [phone] Weekend/After hours pager: [phone]

## 2013-08-09 DIAGNOSIS — R5381 Other malaise: Secondary | ICD-10-CM

## 2013-08-09 DIAGNOSIS — W19XXXA Unspecified fall, initial encounter: Secondary | ICD-10-CM

## 2013-08-09 DIAGNOSIS — S32009A Unspecified fracture of unspecified lumbar vertebra, initial encounter for closed fracture: Secondary | ICD-10-CM

## 2013-08-09 MED ORDER — TAMSULOSIN HCL 0.4 MG PO CAPS: 0.4000 mg | ORAL_CAPSULE | Freq: Every day | ORAL | Status: DC

## 2013-08-09 MED ORDER — HYDROCHLOROTHIAZIDE 12.5 MG PO CAPS: 12.5000 mg | ORAL_CAPSULE | Freq: Every day | ORAL | Status: DC

## 2013-08-09 MED ORDER — AMIODARONE HCL 200 MG PO TABS: ORAL_TABLET | ORAL | Status: DC

## 2013-08-09 MED ORDER — LISINOPRIL 10 MG PO TABS: 10.0000 mg | ORAL_TABLET | Freq: Every day | ORAL | Status: DC

## 2013-08-09 MED ORDER — FINASTERIDE 5 MG PO TABS: 5.0000 mg | ORAL_TABLET | Freq: Every day | ORAL | Status: DC

## 2013-08-09 MED ORDER — LIDOCAINE 5 % EX PTCH: 1.0000 | MEDICATED_PATCH | CUTANEOUS | Status: DC

## 2013-08-09 MED ORDER — METOPROLOL TARTRATE 12.5 MG HALF TABLET: 12.5000 mg | ORAL_TABLET | Freq: Two times a day (BID) | ORAL | Status: DC

## 2013-08-09 MED ORDER — APIXABAN 5 MG PO TABS: 5.0000 mg | ORAL_TABLET | Freq: Two times a day (BID) | ORAL | Status: DC

## 2013-08-09 MED ORDER — SELENIUM 50 MCG PO TABS
200.0000 ug | ORAL_TABLET | Freq: Every day | ORAL | Status: DC
Start: 2013-08-09 — End: 2013-10-06

## 2013-08-09 MED ORDER — PRAVASTATIN SODIUM 20 MG PO TABS: 20.0000 mg | ORAL_TABLET | Freq: Every evening | ORAL | Status: DC

## 2013-08-09 MED ORDER — PANTOPRAZOLE SODIUM 40 MG PO TBEC: 40.0000 mg | DELAYED_RELEASE_TABLET | Freq: Every day | ORAL | Status: DC

## 2013-08-09 NOTE — Progress Notes (Signed)
Subjective/Complaints: Slept well again. Has questions about meds. Feeling well and excited to go home A 12 point review of systems has been performed and if not noted above is otherwise negative.   Objective: Vital Signs: Blood pressure 150/79, pulse 58, temperature 98 F (36.7 C), temperature source Oral, resp. rate 17, height 5' 11"  (1.803 m), weight 65.6 kg (144 lb 10 oz), SpO2 95.00%. No results found. No results found for this basename: WBC, HGB, HCT, PLT,  in the last 72 hours No results found for this basename: NA, K, CL, CO, GLUCOSE, BUN, CREATININE, CALCIUM,  in the last 72 hours CBG (last 3)  No results found for this basename: GLUCAP,  in the last 72 hours  Wt Readings from Last 3 Encounters:  08/09/13 65.6 kg (144 lb 10 oz)  07/27/13 69.6 kg (153 lb 7 oz)  10/28/12 70.489 kg (155 lb 6.4 oz)    Physical Exam:  Constitutional: He is oriented to person, place, and time.  Frail appearing  HENT:  Head: Normocephalic.  Eyes: EOM are normal.  Neck: Normal range of motion. Neck supple. No thyromegaly present.  Cardiovascular:  Cardiac rate is controlled and regular. No murmurs Respiratory: Effort normal and breath sounds normal. No respiratory distress. Chest is clear GI: Soft. Bowel sounds are normal. He exhibits no distension.  Genitourinary:  Neurological: He is alert and oriented to person, place, and time. HOH Patient pleasant and cooperative with exam. He followed commands. A little impulsive. Strength 4/5 in BUE and 4- to 4 BLE. Sensory exam grossly intact. Fair insight and awareness.  Musc: minimal pain with palpation right thigh-  Has muscle wasting BLE. Low back minimally tender Skin: Skin is warm and dry    Assessment/Plan: 1. Functional deficits secondary to deconditioning related to multiple medical issues which require 3+ hours per day of interdisciplinary therapy in a comprehensive inpatient rehab setting. Physiatrist is providing close team  supervision and 24 hour management of active medical problems listed below. Physiatrist and rehab team continue to assess barriers to discharge/monitor patient progress toward functional and medical goals.  Dc today. Goals met. Follow up with me/uro/pcp   FIM: FIM - Bathing Bathing Steps Patient Completed: Chest;Right Arm;Left Arm;Abdomen;Front perineal area;Buttocks;Right upper leg;Left upper leg;Right lower leg (including foot);Left lower leg (including foot) Bathing: 5: Supervision: Safety issues/verbal cues  FIM - Upper Body Dressing/Undressing Upper body dressing/undressing steps patient completed: Thread/unthread right sleeve of pullover shirt/dresss;Thread/unthread left sleeve of pullover shirt/dress;Put head through opening of pull over shirt/dress;Pull shirt over trunk;Thread/unthread right sleeve of front closure shirt/dress;Thread/unthread left sleeve of front closure shirt/dress;Pull shirt around back of front closure shirt/dress;Button/unbutton shirt Upper body dressing/undressing: 5: Set-up assist to: Obtain clothing/put away FIM - Lower Body Dressing/Undressing Lower body dressing/undressing steps patient completed: Thread/unthread right underwear leg;Thread/unthread left underwear leg;Pull underwear up/down;Thread/unthread right pants leg;Thread/unthread left pants leg;Pull pants up/down;Fasten/unfasten pants;Don/Doff right sock;Don/Doff left sock;Don/Doff right shoe;Don/Doff left shoe;Fasten/unfasten right shoe;Fasten/unfasten left shoe Lower body dressing/undressing: 5: Set-up assist to: Obtain clothing  FIM - Toileting Toileting steps completed by patient: Adjust clothing prior to toileting Toileting Assistive Devices: Grab bar or rail for support Toileting: 7: Independent: No helper, no device  FIM - Radio producer Devices: Mining engineer Transfers: 5-To toilet/BSC: Supervision (verbal cues/safety issues)  FIM - Financial trader Devices: Copy: 5: Supine > Sit: Supervision (verbal cues/safety issues)  FIM - Locomotion: Wheelchair Distance: 150' Locomotion: Wheelchair: 6:  Travels 150 ft or more, turns around, maneuvers to table, bed or toilet, negotiates 3% grade: maneuvers on rugs and over door sills independently FIM - Locomotion: Ambulation Locomotion: Ambulation Assistive Devices: Walker - Rolling Ambulation/Gait Assistance: 5: Supervision Locomotion: Ambulation: 5: Travels 150 ft or more with supervision/safety issues  Comprehension Comprehension Mode: Auditory Comprehension: 5-Understands complex 90% of the time/Cues < 10% of the time  Expression Expression Mode: Verbal Expression: 5-Expresses complex 90% of the time/cues < 10% of the time  Social Interaction Social Interaction Mode: Asleep Social Interaction: 6-Interacts appropriately with others with medication or extra time (anti-anxiety, antidepressant).  Problem Solving Problem Solving: 5-Solves complex 90% of the time/cues < 10% of the time  Memory Memory: 6-More than reasonable amt of time  Medical Problem List and Plan:  1. Deconditioning after multi-medical recent L2 fracture  2. DVT Prophylaxis/Anticoagulation: Patient on Eliquis. Monitor for signs of DVT  3. Pain Management: Lidoderm patch, Tylenol. Monitor the increased mobility   - sports cream for right thigh  -lidocaine patch for back very beneficial 4. Neuropsych: This patient is capable of making decisions on his own behalf.  5. Atrial fibrillation/incessant tachycardia. Continue amiodarone as well as Lopressor.  6. Hypertension. Continue with lisinopril as well as hydrochlorothiazide with Lopressor 50 mg twice a day.  7. History of urinary retention. Continue Flomax and proscar. pvr's have been elevated still requiring daily cath  -foley replaced. He sees urology next week  -reduce flomax back to daily  -ua neg, cx  negative 8. Hyperlipidemia. Zocor   LOS (Days) 13 A FACE TO FACE EVALUATION WAS PERFORMED  SWARTZ,ZACHARY T 08/09/2013 8:29 AM

## 2013-08-09 NOTE — Progress Notes (Signed)
Patient and wife received verbal and written discharge instructions by Deatra Inaan Angiulli, PA. Medications, follow up appointments, restrictions,foley care discussed with patient and wife with return demonstration of emptying catheter bag by this Clinical research associatewriter. Both verbalize understanding, deny question or concerns. Personal belongings packed by wife and staff. Patient taken down to private vehicle by NT via wheelchair. Caroline PT adjusted legs on wheelchair to proper height for patient. Roberts-VonCannon, Plummer Matich Elon JesterMichele

## 2013-08-09 NOTE — Progress Notes (Signed)
Social Work  Discharge Note  The overall goal for the admission was met for:   Discharge location: Yes - home with wife providing supervision  - light assistance  Length of Stay: Yes - 13 days  Discharge activity level: Yes - supervision overall  Home/community participation: Yes  Services provided included: MD, RD, PT, OT, RN, TR, Pharmacy and Mark: Medicare [facility])  Follow-up services arranged: Home Health: RN, PT, OT via [place], DME: [phone] lightweight w/c with back cushion, seat cushion via [place] and Patient/Family has no preference for HH/DME agencies  Comments (or additional information):  Patient/Family verbalized understanding of follow-up arrangements: Yes  Individual responsible for coordination of the follow-up plan: patient/ wife  Confirmed correct DME delivered: Lennart Pall 08/09/2013    Chandra Feger

## 2013-08-10 ENCOUNTER — Encounter: Payer: Medicare Other | Admitting: Internal Medicine

## 2013-08-23 DIAGNOSIS — I10 Essential (primary) hypertension: Secondary | ICD-10-CM

## 2013-08-23 DIAGNOSIS — R339 Retention of urine, unspecified: Secondary | ICD-10-CM | POA: Diagnosis not present

## 2013-08-23 DIAGNOSIS — IMO0002 Reserved for concepts with insufficient information to code with codable children: Secondary | ICD-10-CM | POA: Diagnosis not present

## 2013-08-23 DIAGNOSIS — I69922 Dysarthria following unspecified cerebrovascular disease: Secondary | ICD-10-CM

## 2013-08-23 DIAGNOSIS — M810 Age-related osteoporosis without current pathological fracture: Secondary | ICD-10-CM

## 2013-08-23 DIAGNOSIS — I4891 Unspecified atrial fibrillation: Secondary | ICD-10-CM

## 2013-08-23 DIAGNOSIS — R269 Unspecified abnormalities of gait and mobility: Secondary | ICD-10-CM | POA: Diagnosis not present

## 2013-08-23 DIAGNOSIS — K219 Gastro-esophageal reflux disease without esophagitis: Secondary | ICD-10-CM

## 2013-08-23 DIAGNOSIS — Z466 Encounter for fitting and adjustment of urinary device: Secondary | ICD-10-CM | POA: Diagnosis not present

## 2013-09-08 ENCOUNTER — Ambulatory Visit (INDEPENDENT_AMBULATORY_CARE_PROVIDER_SITE_OTHER): Payer: Medicare Other | Admitting: Internal Medicine

## 2013-09-08 ENCOUNTER — Telehealth: Payer: Self-pay | Admitting: Internal Medicine

## 2013-09-08 ENCOUNTER — Encounter: Payer: Self-pay | Admitting: Internal Medicine

## 2013-09-08 VITALS — BP 140/72 | HR 63 | Ht 71.0 in | Wt 149.4 lb

## 2013-09-08 DIAGNOSIS — I4891 Unspecified atrial fibrillation: Secondary | ICD-10-CM

## 2013-09-08 MED ORDER — AMIODARONE HCL 200 MG PO TABS: ORAL_TABLET | ORAL | Status: DC

## 2013-09-08 NOTE — Patient Instructions (Signed)
Your physician wants you to follow-up in: 6 months with Dr Court Joyaylor You will receive a reminder letter in the mail two months in advance. If you don't receive a letter, please call our office to schedule the follow-up appointment.   Your physician has recommended you make the following change in your medication: 1) Decrease Amiodarone to 1 tablet daily Mon-Fri and 1/2 tablet on Sat and Sun

## 2013-09-08 NOTE — Assessment & Plan Note (Signed)
He is maintaining NSR very nicely. He will continue his current meds except we will reduce his amiodarone from 1.4 gm a week to 1.2 gm a week.

## 2013-09-08 NOTE — Telephone Encounter (Signed)
Patient is confused from today's visit about his medication. Please call and advise.

## 2013-09-08 NOTE — Progress Notes (Signed)
HPI  [name] returns today for followup. He is a pleasant 80 yo man with a h/o atrial tachycardia, s/p initiation of amiodarone. He has done well in the interim but admits to dietary indiscretion with sodium. No other symptoms. He denies chest pain or sob. He has tolerated the amiodarone well. No symptomatic bradycardia. No Known Allergies   Current Outpatient Prescriptions  Medication Sig Dispense Refill  . amiodarone (PACERONE) 200 MG tablet Take 100 mg by mouth daily.       Marland Kitchen. apixaban (ELIQUIS) 5 MG TABS tablet Take 1 tablet (5 mg total) by mouth 2 (two) times daily.  60 tablet  4  . CALCIUM PO Take 1 tablet by mouth 2 (two) times daily.      . finasteride (PROSCAR) 5 MG tablet Take 1 tablet (5 mg total) by mouth daily.  30 tablet  1  . hydrochlorothiazide (MICROZIDE) 12.5 MG capsule Take 1 capsule (12.5 mg total) by mouth daily.  30 capsule  1  . lidocaine (LIDODERM) 5 % Place 1 patch onto the skin daily. Remove & Discard patch within 12 hours or as directed by MD  30 patch  0  . lisinopril (PRINIVIL,ZESTRIL) 10 MG tablet Take 1 tablet (10 mg total) by mouth daily.  30 tablet  1  . metoprolol tartrate (LOPRESSOR) 12.5 mg TABS tablet Take 0.5 tablets (12.5 mg total) by mouth 2 (two) times daily.  60 tablet  1  . pantoprazole (PROTONIX) 40 MG tablet Take 1 tablet (40 mg total) by mouth daily.  30 tablet  1  . polyethylene glycol (MIRALAX / GLYCOLAX) packet Take 17 g by mouth daily.      . pravastatin (PRAVACHOL) 20 MG tablet Take 1 tablet (20 mg total) by mouth every evening.  30 tablet  1  . selenium 50 MCG TABS tablet Take 4 tablets (200 mcg total) by mouth daily.  120 tablet  1  . tamsulosin (FLOMAX) 0.4 MG CAPS capsule Take 1 capsule (0.4 mg total) by mouth daily.  30 capsule  1   No current facility-administered medications for this visit.     Past Medical History  Diagnosis Date  . Hypertension   . GERD (gastroesophageal reflux disease)   . Osteoporosis   . High  cholesterol   . Stroke 01/2012    "small; affected his speech; pretty much back to normal now" (09/15/2012)  . Pneumonitis 1971  . Arthritis     "knees; right is worse" (09/15/2012)    ROS:   All systems reviewed and negative except as noted in the HPI.   Past Surgical History  Procedure Laterality Date  . Appendectomy  1953  . Inguinal hernia repair Bilateral 09/15/2012    w/mesh Hattie Perch/notes 09/15/2012  . Inguinal hernia repair Bilateral 09/15/2012    Procedure: HERNIA REPAIR INGUINAL ADULT BILATERAL;  Surgeon: Emelia LoronMatthew Wakefield, MD;  [facility];  Service: General;  Laterality: Bilateral;  . Insertion of mesh Bilateral 09/15/2012    Procedure: INSERTION OF MESH;  Surgeon: Emelia LoronMatthew Wakefield, MD;  [facility];  Service: General;  Laterality: Bilateral;     History reviewed. No pertinent family history.   History   Social History  . Marital Status: Married    Spouse Name: N/A    Number of Children: N/A  . Years of Education: N/A   Occupational History  . Not on file.   Social History Main Topics  . Smoking status: Former Smoker -- 0.50 packs/day for  25 years    Types: Cigarettes    Quit date: 05/11/1968  . Smokeless tobacco: Never Used  . Alcohol Use: No  . Drug Use: No  . Sexual Activity: Not on file   Other Topics Concern  . Not on file   Social History Narrative  . No narrative on file     BP 140/72  Pulse 63  Ht 5\' 11"  (1.803 m)  Wt 149 lb 6.4 oz (67.767 kg)  BMI 20.85 kg/m2  Physical Exam:  Well appearing 80 yo man, NAD HEENT: Unremarkable Neck:  No JVD, no thyromegally Back:  No CVA tenderness Lungs:  Clear with no wheezes HEART:  Regular rate rhythm, no murmurs, no rubs, no clicks Abd:  soft, positive bowel sounds, no organomegally, no rebound, no guarding Ext:  2 plus pulses, no edema, no cyanosis, no clubbing Skin:  No rashes no nodules Neuro:  CN II through XII intact, motor grossly intact  EKG - nsr  Assess/Plan:

## 2013-09-08 NOTE — Telephone Encounter (Signed)
Clarified  that 0.5 tablet twice daily means 1/2 tablet twice daily  Patient verbalized understanding

## 2013-09-10 ENCOUNTER — Emergency Department (HOSPITAL_COMMUNITY)
Admission: EM | Admit: 2013-09-10 | Discharge: 2013-09-10 | Disposition: A | Discharge status: Home / Self Care | Payer: Medicare Other | Attending: Emergency Medicine | Admitting: Emergency Medicine

## 2013-09-10 ENCOUNTER — Encounter (HOSPITAL_COMMUNITY): Payer: Self-pay | Admitting: Emergency Medicine

## 2013-09-10 DIAGNOSIS — Y846 Urinary catheterization as the cause of abnormal reaction of the patient, or of later complication, without mention of misadventure at the time of the procedure: Secondary | ICD-10-CM | POA: Insufficient documentation

## 2013-09-10 DIAGNOSIS — T83011A Breakdown (mechanical) of indwelling urethral catheter, initial encounter: Secondary | ICD-10-CM

## 2013-09-10 DIAGNOSIS — Z87891 Personal history of nicotine dependence: Secondary | ICD-10-CM | POA: Insufficient documentation

## 2013-09-10 DIAGNOSIS — Z9889 Other specified postprocedural states: Secondary | ICD-10-CM | POA: Insufficient documentation

## 2013-09-10 DIAGNOSIS — Z8701 Personal history of pneumonia (recurrent): Secondary | ICD-10-CM | POA: Insufficient documentation

## 2013-09-10 DIAGNOSIS — Z79899 Other long term (current) drug therapy: Secondary | ICD-10-CM | POA: Insufficient documentation

## 2013-09-10 DIAGNOSIS — E7800 Pure hypercholesterolemia, unspecified: Secondary | ICD-10-CM | POA: Insufficient documentation

## 2013-09-10 DIAGNOSIS — Z7902 Long term (current) use of antithrombotics/antiplatelets: Secondary | ICD-10-CM | POA: Insufficient documentation

## 2013-09-10 DIAGNOSIS — Z8673 Personal history of transient ischemic attack (TIA), and cerebral infarction without residual deficits: Secondary | ICD-10-CM | POA: Insufficient documentation

## 2013-09-10 DIAGNOSIS — T83091A Other mechanical complication of indwelling urethral catheter, initial encounter: Secondary | ICD-10-CM | POA: Insufficient documentation

## 2013-09-10 DIAGNOSIS — M810 Age-related osteoporosis without current pathological fracture: Secondary | ICD-10-CM | POA: Insufficient documentation

## 2013-09-10 DIAGNOSIS — I10 Essential (primary) hypertension: Secondary | ICD-10-CM | POA: Insufficient documentation

## 2013-09-10 DIAGNOSIS — K219 Gastro-esophageal reflux disease without esophagitis: Secondary | ICD-10-CM | POA: Insufficient documentation

## 2013-09-10 DIAGNOSIS — Z8739 Personal history of other diseases of the musculoskeletal system and connective tissue: Secondary | ICD-10-CM | POA: Insufficient documentation

## 2013-09-10 NOTE — ED Notes (Signed)
Pt reports foley catheter bag has a hole in it and is leaking. Needs new bag. Currently has duct tape applied. No other complaint. Has appointment with urology on Tuesday.

## 2013-09-10 NOTE — ED Provider Notes (Addendum)
CSN: [id number]     Arrival date & time 09/10/13  1344 History   First MD Initiated Contact with Patient 09/10/13 1414     Chief Complaint  Patient presents with  . Foley Catheter Bag Leaking      (Consider location/radiation/quality/duration/timing/severity/associated sxs/prior Treatment) HPI Patient presents to the emergency department with a complication of his Foley catheter bag.  The patient, states, is a hole in the bag leaking.  He called his urologist, who advised him, that he'll need to come to the emergency department for replacement of the Foley catheter bag.  The patient placed duct tape over the area where the hole is located.  Patient has no other complaints at this time Past Medical History  Diagnosis Date  . Hypertension   . GERD (gastroesophageal reflux disease)   . Osteoporosis   . High cholesterol   . Stroke 01/2012    "small; affected his speech; pretty much back to normal now" (09/15/2012)  . Pneumonitis 1971  . Arthritis     "knees; right is worse" (09/15/2012)   Past Surgical History  Procedure Laterality Date  . Appendectomy  1953  . Inguinal hernia repair Bilateral 09/15/2012    w/mesh Hattie Perch/notes 09/15/2012  . Inguinal hernia repair Bilateral 09/15/2012    Procedure: HERNIA REPAIR INGUINAL ADULT BILATERAL;  Surgeon: Emelia LoronMatthew Wakefield, MD;  [facility];  Service: General;  Laterality: Bilateral;  . Insertion of mesh Bilateral 09/15/2012    Procedure: INSERTION OF MESH;  Surgeon: Emelia LoronMatthew Wakefield, MD;  [facility];  Service: General;  Laterality: Bilateral;   No family history on file. History  Substance Use Topics  . Smoking status: Former Smoker -- 0.50 packs/day for 25 years    Types: Cigarettes    Quit date: 05/11/1968  . Smokeless tobacco: Never Used  . Alcohol Use: No    Review of Systems  All other systems negative except as documented in the HPI. All pertinent positives and negatives as reviewed in the HPI.  Allergies  Review of patient's  allergies indicates no known allergies.  Home Medications   Prior to Admission medications   Medication Sig Start Date End Date Taking? Authorizing Provider  amiodarone (PACERONE) 200 MG tablet One tablet by mouth Mon- Fri and 1/2 tablet Sat and Sun 09/08/13   Marinus MawGregg W Taylor, MD  apixaban (ELIQUIS) 5 MG TABS tablet Take 1 tablet (5 mg total) by mouth 2 (two) times daily. 08/09/13   Mcarthur Rossettianiel J Angiulli, PA-C  CALCIUM PO Take 1 tablet by mouth 2 (two) times daily.    Historical Provider, MD  finasteride (PROSCAR) 5 MG tablet Take 1 tablet (5 mg total) by mouth daily. 08/09/13   Mcarthur Rossettianiel J Angiulli, PA-C  hydrochlorothiazide (MICROZIDE) 12.5 MG capsule Take 1 capsule (12.5 mg total) by mouth daily. 08/09/13   Mcarthur Rossettianiel J Angiulli, PA-C  lidocaine (LIDODERM) 5 % Place 1 patch onto the skin daily. Remove & Discard patch within 12 hours or as directed by MD 08/09/13   Mcarthur Rossettianiel J Angiulli, PA-C  lisinopril (PRINIVIL,ZESTRIL) 10 MG tablet Take 1 tablet (10 mg total) by mouth daily. 08/09/13   Mcarthur Rossettianiel J Angiulli, PA-C  metoprolol tartrate (LOPRESSOR) 12.5 mg TABS tablet Take 0.5 tablets (12.5 mg total) by mouth 2 (two) times daily. 08/09/13   Mcarthur Rossettianiel J Angiulli, PA-C  pantoprazole (PROTONIX) 40 MG tablet Take 1 tablet (40 mg total) by mouth daily. 08/09/13   Mcarthur Rossettianiel J Angiulli, PA-C  polyethylene glycol (MIRALAX / GLYCOLAX) packet Take 17 g by  mouth daily.    Historical Provider, MD  pravastatin (PRAVACHOL) 20 MG tablet Take 1 tablet (20 mg total) by mouth every evening. 08/09/13   Mcarthur Rossettianiel J Angiulli, PA-C  selenium 50 MCG TABS tablet Take 4 tablets (200 mcg total) by mouth daily. 08/09/13   Mcarthur Rossettianiel J Angiulli, PA-C  tamsulosin (FLOMAX) 0.4 MG CAPS capsule Take 1 capsule (0.4 mg total) by mouth daily. 08/09/13   Daniel J Angiulli, PA-C   BP 148/66  Pulse 66  Temp(Src) 98.2 F (36.8 C) (Oral)  SpO2 95% Physical Exam  Nursing note and vitals reviewed. Constitutional: He is oriented to person, place, and time. He appears well-developed  and well-nourished. No distress.  HENT:  Head: Normocephalic and atraumatic.  Eyes: Pupils are equal, round, and reactive to light.  Pulmonary/Chest: Effort normal.  Abdominal: Soft. Bowel sounds are normal. He exhibits no distension. There is no tenderness.  Genitourinary: Penis normal. No penile tenderness.  Neurological: He is alert and oriented to person, place, and time.  Skin: Skin is warm and dry. No rash noted. No erythema.    ED Course  Procedures  The patient's Foley catheter bag will be replaced.  He is advised followup with his urologist as scheduled   Carlyle DollyChristopher W Tamasha Laplante, PA-C 09/10/13 1422  Carlyle Dollyhristopher W Lucus Lambertson, PA-C 09/25/13 1106

## 2013-09-10 NOTE — ED Notes (Signed)
PA, Thayer OhmChris to triage room to assess patient. Will provide patient with a new drainage bag.

## 2013-09-10 NOTE — Discharge Instructions (Signed)
REturn here as needed. Follow up with your Urologist

## 2013-09-11 ENCOUNTER — Encounter: Payer: Self-pay | Admitting: Podiatry

## 2013-09-11 ENCOUNTER — Ambulatory Visit (INDEPENDENT_AMBULATORY_CARE_PROVIDER_SITE_OTHER): Payer: Medicare Other | Admitting: Podiatry

## 2013-09-11 VITALS — BP 146/79 | HR 56 | Resp 18

## 2013-09-11 DIAGNOSIS — B351 Tinea unguium: Secondary | ICD-10-CM

## 2013-09-11 NOTE — ED Provider Notes (Signed)
Medical screening examination/treatment/procedure(s) were performed by non-physician practitioner and as supervising physician I was immediately available for consultation/collaboration.   EKG Interpretation None        Brieann Osinski Y. Lemoine Goyne, MD 09/11/13 1623 

## 2013-09-11 NOTE — Progress Notes (Signed)
° °  Subjective:    Patient ID: Mark Joseph, male    DOB: 11/16/1921, 80 y.o.   MRN: [id number]  HPI I am here to get my toenails trimmed and check my feet The last visit for similar service was 02/12/2012    Review of Systems  All other systems reviewed and are negative.      Objective:   Physical Exam Orientated x393  80 year old white male  Elongated, hypertrophic, discolored toenails x10        Assessment & Plan:   Assessment Symptomatic onychomycoses x10  Plan: Nails x10 are debrided without any bleeding. Reappoint at patient's request

## 2013-09-19 ENCOUNTER — Other Ambulatory Visit: Payer: Self-pay | Admitting: *Deleted

## 2013-09-19 MED ORDER — METOPROLOL TARTRATE 25 MG PO TABS: 12.5000 mg | ORAL_TABLET | Freq: Two times a day (BID) | ORAL | Status: DC

## 2013-09-19 MED ORDER — APIXABAN 5 MG PO TABS: 5.0000 mg | ORAL_TABLET | Freq: Two times a day (BID) | ORAL | Status: DC

## 2013-09-19 MED ORDER — METOPROLOL TARTRATE 12.5 MG HALF TABLET: 12.5000 mg | ORAL_TABLET | Freq: Two times a day (BID) | ORAL | Status: DC

## 2013-09-20 ENCOUNTER — Telehealth: Payer: Self-pay | Admitting: Internal Medicine

## 2013-09-20 NOTE — Telephone Encounter (Signed)
Okay to proceed with surgery if medically necessary.  Okay to hold Eliquis for 2 days prior and restart 3 days after per Dr Ladona Ridgelaylor.  Wife aware

## 2013-09-20 NOTE — Telephone Encounter (Addendum)
Called patient back and spoke with his wife   He is going to have a TURP Dr Kathrynn RunningManning at [facility]   Wants him to stop 5 days prior and then stay off 9 days after.  Patient had a CVA 01/2013

## 2013-09-20 NOTE — Telephone Encounter (Signed)
New message    Patient wife calling   Seen a urology on yesterday. Patient on eliqiuis need to  come off medication for 2 week for upcoming procedure.

## 2013-09-26 ENCOUNTER — Other Ambulatory Visit: Payer: Self-pay | Admitting: Internal Medicine

## 2013-09-26 DIAGNOSIS — M545 Low back pain, unspecified: Secondary | ICD-10-CM

## 2013-10-05 ENCOUNTER — Ambulatory Visit
Admission: RE | Admit: 2013-10-05 | Discharge: 2013-10-05 | Disposition: A | Discharge status: Home / Self Care | Payer: Medicare Other | Source: Ambulatory Visit | Attending: Internal Medicine | Admitting: Internal Medicine

## 2013-10-05 ENCOUNTER — Telehealth: Payer: Self-pay

## 2013-10-05 DIAGNOSIS — M545 Low back pain, unspecified: Secondary | ICD-10-CM

## 2013-10-05 MED ORDER — GADOBENATE DIMEGLUMINE 529 MG/ML IV SOLN
13.0000 mL | Freq: Once | INTRAVENOUS | Status: AC | PRN
  Administered 2013-10-05: 13 mL via INTRAVENOUS

## 2013-10-05 NOTE — Telephone Encounter (Signed)
Heather @ [facility] is requesting a 60 day recert period for HHC. Patient still has a foley that needs to be changed, and his recert period has ended. Is this okay?

## 2013-10-06 ENCOUNTER — Inpatient Hospital Stay (HOSPITAL_COMMUNITY)
Admission: AD | Admit: 2013-10-06 | Discharge: 2013-10-17 | DRG: 460 | Disposition: A | Discharge status: Rehabilitation Facility | Payer: Medicare Other | Source: Ambulatory Visit | Attending: Neurosurgery | Admitting: Neurosurgery

## 2013-10-06 DIAGNOSIS — I10 Essential (primary) hypertension: Secondary | ICD-10-CM | POA: Diagnosis present

## 2013-10-06 DIAGNOSIS — S32020A Wedge compression fracture of second lumbar vertebra, initial encounter for closed fracture: Secondary | ICD-10-CM | POA: Diagnosis present

## 2013-10-06 DIAGNOSIS — E7800 Pure hypercholesterolemia, unspecified: Secondary | ICD-10-CM | POA: Diagnosis present

## 2013-10-06 DIAGNOSIS — R262 Difficulty in walking, not elsewhere classified: Secondary | ICD-10-CM | POA: Diagnosis present

## 2013-10-06 DIAGNOSIS — M48061 Spinal stenosis, lumbar region without neurogenic claudication: Secondary | ICD-10-CM | POA: Diagnosis present

## 2013-10-06 DIAGNOSIS — Z8673 Personal history of transient ischemic attack (TIA), and cerebral infarction without residual deficits: Secondary | ICD-10-CM

## 2013-10-06 DIAGNOSIS — M810 Age-related osteoporosis without current pathological fracture: Secondary | ICD-10-CM | POA: Diagnosis present

## 2013-10-06 DIAGNOSIS — R32 Unspecified urinary incontinence: Secondary | ICD-10-CM | POA: Diagnosis present

## 2013-10-06 DIAGNOSIS — K219 Gastro-esophageal reflux disease without esophagitis: Secondary | ICD-10-CM | POA: Diagnosis present

## 2013-10-06 DIAGNOSIS — M129 Arthropathy, unspecified: Secondary | ICD-10-CM | POA: Diagnosis present

## 2013-10-06 DIAGNOSIS — S32009A Unspecified fracture of unspecified lumbar vertebra, initial encounter for closed fracture: Principal | ICD-10-CM | POA: Diagnosis present

## 2013-10-06 DIAGNOSIS — M549 Dorsalgia, unspecified: Secondary | ICD-10-CM | POA: Diagnosis present

## 2013-10-06 DIAGNOSIS — Z79899 Other long term (current) drug therapy: Secondary | ICD-10-CM

## 2013-10-06 DIAGNOSIS — W19XXXA Unspecified fall, initial encounter: Secondary | ICD-10-CM | POA: Diagnosis present

## 2013-10-06 DIAGNOSIS — Z87891 Personal history of nicotine dependence: Secondary | ICD-10-CM

## 2013-10-06 DIAGNOSIS — G834 Cauda equina syndrome: Secondary | ICD-10-CM | POA: Diagnosis present

## 2013-10-06 DIAGNOSIS — Z9181 History of falling: Secondary | ICD-10-CM

## 2013-10-06 DIAGNOSIS — I4891 Unspecified atrial fibrillation: Secondary | ICD-10-CM | POA: Diagnosis present

## 2013-10-06 LAB — CBC WITH DIFFERENTIAL/PLATELET
BASOS ABS: 0 10*3/uL (ref 0.0–0.1)
BASOS PCT: 1 % (ref 0–1)
Eosinophils Absolute: 0.2 10*3/uL (ref 0.0–0.7)
Eosinophils Relative: 3 % (ref 0–5)
HCT: 35.1 % — ABNORMAL LOW (ref 39.0–52.0)
Hemoglobin: 12.3 g/dL — ABNORMAL LOW (ref 13.0–17.0)
LYMPHS PCT: 22 % (ref 12–46)
Lymphs Abs: 1.5 10*3/uL (ref 0.7–4.0)
MCH: 32.3 pg (ref 26.0–34.0)
MCHC: 35 g/dL (ref 30.0–36.0)
MCV: 92.1 fL (ref 78.0–100.0)
Monocytes Absolute: 0.7 10*3/uL (ref 0.1–1.0)
Monocytes Relative: 10 % (ref 3–12)
NEUTROS ABS: 4.2 10*3/uL (ref 1.7–7.7)
NEUTROS PCT: 64 % (ref 43–77)
PLATELETS: 234 10*3/uL (ref 150–400)
RBC: 3.81 MIL/uL — ABNORMAL LOW (ref 4.22–5.81)
RDW: 13.7 % (ref 11.5–15.5)
WBC: 6.5 10*3/uL (ref 4.0–10.5)

## 2013-10-06 LAB — COMPREHENSIVE METABOLIC PANEL
ALK PHOS: 61 U/L (ref 39–117)
ALT: 19 U/L (ref 0–53)
AST: 25 U/L (ref 0–37)
Albumin: 3.9 g/dL (ref 3.5–5.2)
BILIRUBIN TOTAL: 0.4 mg/dL (ref 0.3–1.2)
BUN: 16 mg/dL (ref 6–23)
CO2: 29 meq/L (ref 19–32)
Calcium: 9.5 mg/dL (ref 8.4–10.5)
Chloride: 97 mEq/L (ref 96–112)
Creatinine, Ser: 1.04 mg/dL (ref 0.50–1.35)
GFR, EST AFRICAN AMERICAN: 71 mL/min — AB (ref >90)
GFR, EST NON AFRICAN AMERICAN: 62 mL/min — AB (ref >90)
GLUCOSE: 131 mg/dL — AB (ref 70–99)
POTASSIUM: 4.3 meq/L (ref 3.7–5.3)
SODIUM: 136 meq/L — AB (ref 137–147)
Total Protein: 7 g/dL (ref 6.0–8.3)

## 2013-10-06 MED ORDER — DOCUSATE SODIUM 100 MG PO CAPS
100.0000 mg | ORAL_CAPSULE | Freq: Two times a day (BID) | ORAL | Status: DC
  Administered 2013-10-06 – 2013-10-16 (×19): 100 mg via ORAL
  Filled 2013-10-06 (×23): qty 1

## 2013-10-06 MED ORDER — ACETAMINOPHEN 325 MG PO TABS: 650.0000 mg | ORAL_TABLET | Freq: Four times a day (QID) | ORAL | Status: DC | PRN

## 2013-10-06 MED ORDER — BISACODYL 5 MG PO TBEC
5.0000 mg | DELAYED_RELEASE_TABLET | Freq: Every day | ORAL | Status: DC | PRN
Start: 2013-10-06 — End: 2013-10-17
  Administered 2013-10-16: 5 mg via ORAL
  Filled 2013-10-06 (×2): qty 1

## 2013-10-06 MED ORDER — POTASSIUM CHLORIDE IN NACL 20-0.9 MEQ/L-% IV SOLN
INTRAVENOUS | Status: DC
  Administered 2013-10-06 – 2013-10-12 (×3): via INTRAVENOUS
  Filled 2013-10-06 (×9): qty 1000

## 2013-10-06 MED ORDER — POLYETHYLENE GLYCOL 3350 17 G PO PACK
17.0000 g | PACK | Freq: Every day | ORAL | Status: DC | PRN
  Administered 2013-10-15: 17 g via ORAL
  Filled 2013-10-06 (×2): qty 1

## 2013-10-06 MED ORDER — HEPARIN (PORCINE) IN NACL 100-0.45 UNIT/ML-% IJ SOLN
950.0000 [IU]/h | INTRAMUSCULAR | Status: DC
  Administered 2013-10-06: 850 [IU]/h via INTRAVENOUS
  Administered 2013-10-08: 950 [IU]/h via INTRAVENOUS
  Filled 2013-10-06 (×2): qty 250

## 2013-10-06 MED ORDER — ALUM & MAG HYDROXIDE-SIMETH 200-200-20 MG/5ML PO SUSP
30.0000 mL | Freq: Four times a day (QID) | ORAL | Status: DC | PRN
  Administered 2013-10-16: 30 mL via ORAL
  Filled 2013-10-06: qty 30

## 2013-10-06 MED ORDER — HYDROCODONE-ACETAMINOPHEN 5-325 MG PO TABS
1.0000 | ORAL_TABLET | ORAL | Status: DC | PRN
  Administered 2013-10-13 (×2): 1 via ORAL
  Administered 2013-10-13 (×2): 2 via ORAL
  Administered 2013-10-14 – 2013-10-16 (×5): 1 via ORAL
  Filled 2013-10-06: qty 2
  Filled 2013-10-06 (×2): qty 1
  Filled 2013-10-06 (×2): qty 2
  Filled 2013-10-06 (×5): qty 1

## 2013-10-06 MED ORDER — MAGNESIUM CITRATE PO SOLN
1.0000 | Freq: Once | ORAL | Status: AC | PRN
Start: 2013-10-06 — End: 2013-10-06
  Filled 2013-10-06: qty 296

## 2013-10-06 MED ORDER — ACETAMINOPHEN 650 MG RE SUPP: 650.0000 mg | Freq: Four times a day (QID) | RECTAL | Status: DC | PRN

## 2013-10-06 NOTE — Progress Notes (Signed)
Pt. Has arrived on the floor as direct admit from home.  Vital signs and assessment has been obtained.  MD notified about pt. Arrival to unit.  Will continue to monitor patient and await further orders.

## 2013-10-06 NOTE — H&P (Signed)
Mark Joseph is an 80 y.o. male.   Chief Complaint: L2 compression fracture, retropulsion, spinal canal compromise HPI: whom fell in March of 2015. Xray identified L2 compression fracture, no retropulsion. He was admitted for pain, and new onset atrial fibrillation. He had a prolonged stay in the hospital and rehab. Since discharge he has had increasing difficulty with back pain, walking, and urinary incontinence. I was contacted today by his primary care physician due to MRI showing severe compression and retropulsion. He is also on Eliquis for his atrial fibrilaltion, obviating surgical decompression and stabilization at this time. He will be admitted in anticipation of heparinization during his time off Eliquis.   Past Medical History  Diagnosis Date  . Hypertension   . GERD (gastroesophageal reflux disease)   . Osteoporosis   . High cholesterol   . Stroke 01/2012    "small; affected his speech; pretty much back to normal now" (09/15/2012)  . Pneumonitis 1971  . Arthritis     "knees; right is worse" (09/15/2012)    Past Surgical History  Procedure Laterality Date  . Appendectomy  1953  . Inguinal hernia repair Bilateral 09/15/2012    w/mesh Mark Joseph 09/15/2012  . Inguinal hernia repair Bilateral 09/15/2012    Procedure: HERNIA REPAIR INGUINAL ADULT BILATERAL;  Surgeon: Mark Loron, MD;  Location: [facility] OR;  Service: General;  Laterality: Bilateral;  . Insertion of mesh Bilateral 09/15/2012    Procedure: INSERTION OF MESH;  Surgeon: Mark Loron, MD;  Location: [facility] OR;  Service: General;  Laterality: Bilateral;    No family history on file. Social History:  reports that he quit smoking about 45 years ago. His smoking use included Cigarettes. He has a 12.5 pack-year smoking history. He has never used smokeless tobacco. He reports that he does not drink alcohol or use illicit drugs.  Allergies: No Known Allergies  Medications Prior to Admission  Medication Sig Dispense Refill  .  amiodarone (PACERONE) 200 MG tablet One tablet by mouth Mon- Fri and 1/2 tablet Sat and Sun  75 tablet  3  . apixaban (ELIQUIS) 5 MG TABS tablet Take 1 tablet (5 mg total) by mouth 2 (two) times daily.  180 tablet  1  . CALCIUM PO Take 1 tablet by mouth 2 (two) times daily.      . finasteride (PROSCAR) 5 MG tablet Take 1 tablet (5 mg total) by mouth daily.  30 tablet  1  . hydrochlorothiazide (MICROZIDE) 12.5 MG capsule Take 1 capsule (12.5 mg total) by mouth daily.  30 capsule  1  . lidocaine (LIDODERM) 5 % Place 1 patch onto the skin daily. Remove & Discard patch within 12 hours or as directed by MD  30 patch  0  . lisinopril (PRINIVIL,ZESTRIL) 10 MG tablet Take 1 tablet (10 mg total) by mouth daily.  30 tablet  1  . lisinopril-hydrochlorothiazide (PRINZIDE,ZESTORETIC) 20-25 MG per tablet       . metoprolol tartrate (LOPRESSOR) 25 MG tablet Take 0.5 tablets (12.5 mg total) by mouth 2 (two) times daily.  90 tablet  1  . pantoprazole (PROTONIX) 40 MG tablet Take 1 tablet (40 mg total) by mouth daily.  30 tablet  1  . polyethylene glycol (MIRALAX / GLYCOLAX) packet Take 17 g by mouth daily.      . pravastatin (PRAVACHOL) 20 MG tablet Take 1 tablet (20 mg total) by mouth every evening.  30 tablet  1  . selenium 50 MCG TABS tablet Take 4 tablets (200 mcg  total) by mouth daily.  120 tablet  1  . tamsulosin (FLOMAX) 0.4 MG CAPS capsule Take 1 capsule (0.4 mg total) by mouth daily.  30 capsule  1    No results found for this or any previous visit (from the past 48 hour(s)). Mr Mark Joseph W Wo Contrast  10/05/2013   CLINICAL DATA:  80 year old male with worsening pain and difficulty walking. Initial encounter. L2 compression fracture discovered in March after a fall.  [facility] and creatinine [facility] at  [address]  Results:  BUN 15 mg/dL,  Creatinine 1.0 mg/dL.  EXAM: MRI Mark Joseph WITHOUT AND WITH CONTRAST  TECHNIQUE: Multiplanar and multiecho pulse sequences  of the Mark Joseph were obtained without and with intravenous contrast.  CONTRAST:  13mL MULTIHANCE GADOBENATE DIMEGLUMINE 529 MG/ML IV SOLN  COMPARISON:  Mark radiographs 07/14/2013. CT Abdomen and Pelvis 10/03/2012.  FINDINGS: Normal Mark segmentation depicted on the comparison. Chronic mild T12 vertebral body compression appear stable.  The L2 injury seen in March has progressed to vertebra plana, with bulky posterior retropulsion of bone contributing to severe spinal stenosis at the L1 and L2 levels. The conus medullaris terminates at T12-L1. There is associated patchy edema and enhancement in the compressed L2 vertebral body. The posterior elements appear intact with normal marrow signal. No suspicious postcontrast enhancement of the lesion.  Bone marrow signal elsewhere in the visible lower thoracic and Mark is within normal limits; there is a prominent vertebral body hemangioma at L1. Decreased T1 signal at the anterior superior right SI joint corresponds to a small sclerotic lesion on 10/03/2012, and appears to be stable.  Elsewhere preserved vertebral height and alignment.  Negative visualized abdominal viscera. Evidence of sigmoid diverticulosis in the pelvis. There is increased signal in the erector spinae muscles in the mid Mark Joseph greater on the right. Legrand RamsFavor this reflects altered biomechanics in this setting.  The following superimposed degenerative changes are noted: T11-T12: Negative.  T12-L1:  Negative.  L1-L2: Severe multifactorial spinal stenosis largely related to L2 vertebra plana and bulky retropulsion as above. Moderate bilateral L1 foraminal stenosis.  L2-L3: Severe multifactorial spinal stenosis largely related to L2 vertebra plana and bulky retropulsion as above. Moderate right greater than left L2 foraminal stenosis.  L3-L4: Mild circumferential disc osteophyte complex, facet and ligament flavum hypertrophy. No significant stenosis.  L4-L5: Vacuum disc. Circumferential disc  osteophyte complex. Moderate facet and ligament flavum hypertrophy. Superimposed small right paracentral disc protrusion (series 3, image 7). Mild spinal and mild to moderate lateral recess stenosis greater on the left. Moderate bilateral L4 foraminal stenosis.  L5-S1: Vacuum disc. Circumferential disc osteophyte complex. Mild to moderate bilateral L5 foraminal stenosis.  IMPRESSION: 1. Significant progression of the L2 compression fracture detected in March, now vertebra plana an with bulky retropulsion of bone resulting in severe spinal stenosis at the L2 level. I do favor this is a benign/osteoporotic compression fracture. 2. No other acute findings in the Mark Joseph. Mild chronic T12 compression fracture. Mild to moderate degenerative stenosis at L4-L5.  Study discussed by telephone with Dr. Kirby FunkJOHN GRIFFIN on 10/05/2013 at 14:34 .   Electronically Signed   By: Augusto GambleLee  Hall M.D.   On: 10/05/2013 14:34    Review of Systems  HENT: Positive for hearing loss.   Eyes: Negative.   Respiratory: Negative.   Cardiovascular:       Atrial fibrillation  Gastrointestinal: Negative.   Genitourinary:       Urinary  incontinence  Musculoskeletal: Positive for back pain and falls.  Skin: Negative.   Neurological: Positive for weakness.  Endo/Heme/Allergies: Negative.   Psychiatric/Behavioral: Negative.     Blood pressure 155/79, pulse 52, temperature 98.6 F (37 C), temperature source Oral, resp. rate 16, height 5\' 9"  (1.753 m), weight 67.586 kg (149 lb), SpO2 94.00%. Physical Exam  Constitutional: He is oriented to person, place, and time. He appears well-developed and well-nourished. No distress.  HENT:  Head: Normocephalic and atraumatic.  Right Ear: External ear normal.  Left Ear: External ear normal.  Nose: Nose normal.  Mouth/Throat: Oropharynx is clear and moist.  Eyes: Conjunctivae and EOM are normal. Pupils are equal, round, and reactive to light.  Neck: Normal range of motion. Neck supple.   Respiratory: Effort normal and breath sounds normal.  GI: Soft. Bowel sounds are normal.  Musculoskeletal: Normal range of motion. He exhibits tenderness.  Neurological: He is alert and oriented to person, place, and time. He displays no tremor. A cranial nerve deficit is present. No sensory deficit. He exhibits normal muscle tone. He displays no seizure activity. Coordination normal. He displays no Babinski's sign on the right side. He displays no Babinski's sign on the left side.  Intact proprioception upper and lower extremities Mild weakness right hip flexors 4/5 Gait is antalgic, stoops forward Decreased hearing bilaterally, left worse than right  Skin: Skin is warm and dry.  Psychiatric: He has a normal mood and affect. His behavior is normal. Judgment and thought content normal.     Assessment/Plan Or for Thursday. L2 corpectomy, L1-2 Mark arthrodesis.  Will place on heparin.   Carmela Hurt 10/06/2013, 6:57 PM

## 2013-10-06 NOTE — Telephone Encounter (Signed)
Yes- this is fine; thanks- 

## 2013-10-06 NOTE — Progress Notes (Signed)
ANTICOAGULATION CONSULT NOTE - Initial Consult  Pharmacy Consult for heparin Indication: atrial fibrillation  No Known Allergies  Patient Measurements: Height: 5\' 9"  (175.3 cm) Weight: 149 lb (67.586 kg) IBW/kg (Calculated) : 70.7 Heparin Dosing Weight: 67kg  Vital Signs: Temp: 98.6 F (37 C) (05/29 1814) Temp src: Oral (05/29 1814) BP: 155/79 mmHg (05/29 1814) Pulse Rate: 52 (05/29 1814)  Labs:  Recent Labs  10/06/13 2025  HGB 12.3*  HCT 35.1*  PLT 234    Estimated Creatinine Clearance: 45.6 ml/min (by C-G formula based on Cr of 1.05).   Medical History: Past Medical History  Diagnosis Date  . Hypertension   . GERD (gastroesophageal reflux disease)   . Osteoporosis   . High cholesterol   . Stroke 01/2012    "small; affected his speech; pretty much back to normal now" (09/15/2012)  . Pneumonitis 1971  . Arthritis     "knees; right is worse" (09/15/2012)    Assessment: 89 YOM who fell in March and is here for surgical decompression and stabilization of L2 compression fracture. Is on Eliquis PTA for AFib which is a newer diagnosis for the patient. His last Eliquis dose was this morning at ~0800 per my discussion with him. Estimated CrCl using last SCr from March is ~58mL/min. No bleeding or bruising noted. Baseline hgb 12.3, platelets 234.  Goal of Therapy:  Heparin level 0.3-0.7 units/ml Monitor platelets by anticoagulation protocol: Yes   Plan:  1. Start heparin drip at 850 units/hr NO BOLUS 2. First heparin level with AM labs 3. Daily heparin level and CBC 4. Planning for surgery Thursday- follow for any change to plan and anticoagulation post-op  Adella Manolis D. Kaydin Labo, PharmD, BCPS Clinical Pharmacist Pager: [phone] 10/06/2013 9:04 PM

## 2013-10-06 NOTE — Telephone Encounter (Signed)
Herbert Seta called again regarding patient care.  Please see other message.

## 2013-10-06 NOTE — Telephone Encounter (Signed)
Left message advising Mark Joseph recert was fine and foley change.

## 2013-10-07 LAB — APTT
APTT: 74 s — AB (ref 24–37)
aPTT: 55 seconds — ABNORMAL HIGH (ref 24–37)

## 2013-10-07 LAB — CBC
HCT: 32.3 % — ABNORMAL LOW (ref 39.0–52.0)
Hemoglobin: 11.4 g/dL — ABNORMAL LOW (ref 13.0–17.0)
MCH: 32.5 pg (ref 26.0–34.0)
MCHC: 35.3 g/dL (ref 30.0–36.0)
MCV: 92 fL (ref 78.0–100.0)
Platelets: 208 10*3/uL (ref 150–400)
RBC: 3.51 MIL/uL — ABNORMAL LOW (ref 4.22–5.81)
RDW: 13.6 % (ref 11.5–15.5)
WBC: 5.7 10*3/uL (ref 4.0–10.5)

## 2013-10-07 LAB — HEPARIN LEVEL (UNFRACTIONATED)
Heparin Unfractionated: 1 IU/mL — ABNORMAL HIGH (ref 0.30–0.70)
Heparin Unfractionated: 0.79 IU/mL — ABNORMAL HIGH (ref 0.30–0.70)

## 2013-10-07 MED ORDER — FINASTERIDE 5 MG PO TABS
5.0000 mg | ORAL_TABLET | Freq: Every day | ORAL | Status: DC
  Administered 2013-10-07 – 2013-10-17 (×11): 5 mg via ORAL
  Filled 2013-10-07 (×12): qty 1

## 2013-10-07 MED ORDER — METOPROLOL TARTRATE 12.5 MG HALF TABLET
12.5000 mg | ORAL_TABLET | Freq: Two times a day (BID) | ORAL | Status: DC
  Administered 2013-10-07 – 2013-10-17 (×18): 12.5 mg via ORAL
  Filled 2013-10-07 (×24): qty 1

## 2013-10-07 MED ORDER — AMIODARONE HCL 100 MG PO TABS: 100.0000 mg | ORAL_TABLET | Freq: Every day | ORAL | Status: DC

## 2013-10-07 MED ORDER — SIMVASTATIN 10 MG PO TABS
10.0000 mg | ORAL_TABLET | Freq: Every day | ORAL | Status: DC
  Administered 2013-10-07 – 2013-10-15 (×7): 10 mg via ORAL
  Filled 2013-10-07 (×11): qty 1

## 2013-10-07 MED ORDER — TAMSULOSIN HCL 0.4 MG PO CAPS
0.4000 mg | ORAL_CAPSULE | Freq: Every day | ORAL | Status: DC
  Administered 2013-10-07 – 2013-10-17 (×11): 0.4 mg via ORAL
  Filled 2013-10-07 (×13): qty 1

## 2013-10-07 MED ORDER — CIPROFLOXACIN HCL 500 MG PO TABS
500.0000 mg | ORAL_TABLET | Freq: Two times a day (BID) | ORAL | Status: AC
  Administered 2013-10-07 (×2): 500 mg via ORAL
  Filled 2013-10-07 (×2): qty 1

## 2013-10-07 MED ORDER — AMIODARONE HCL 200 MG PO TABS
200.0000 mg | ORAL_TABLET | ORAL | Status: DC
  Filled 2013-10-07 (×2): qty 1

## 2013-10-07 MED ORDER — CALCIUM CARBONATE 1250 (500 CA) MG PO TABS
1250.0000 mg | ORAL_TABLET | Freq: Two times a day (BID) | ORAL | Status: DC
  Administered 2013-10-07 – 2013-10-17 (×20): 1250 mg via ORAL
  Filled 2013-10-07 (×23): qty 1

## 2013-10-07 MED ORDER — HYDROCHLOROTHIAZIDE 12.5 MG PO CAPS
12.5000 mg | ORAL_CAPSULE | Freq: Every day | ORAL | Status: DC
  Administered 2013-10-07 – 2013-10-17 (×8): 12.5 mg via ORAL
  Filled 2013-10-07 (×12): qty 1

## 2013-10-07 MED ORDER — LISINOPRIL 10 MG PO TABS
10.0000 mg | ORAL_TABLET | Freq: Every day | ORAL | Status: DC
  Administered 2013-10-07 – 2013-10-17 (×8): 10 mg via ORAL
  Filled 2013-10-07 (×12): qty 1

## 2013-10-07 MED ORDER — PANTOPRAZOLE SODIUM 40 MG PO TBEC
40.0000 mg | DELAYED_RELEASE_TABLET | Freq: Every day | ORAL | Status: DC
  Administered 2013-10-07 – 2013-10-17 (×11): 40 mg via ORAL
  Filled 2013-10-07 (×9): qty 1

## 2013-10-07 MED ORDER — SELENIUM 50 MCG PO TABS: 200.0000 ug | ORAL_TABLET | Freq: Every day | ORAL | Status: DC

## 2013-10-07 MED ORDER — AMIODARONE HCL 100 MG PO TABS
100.0000 mg | ORAL_TABLET | ORAL | Status: DC
  Administered 2013-10-07: 100 mg via ORAL
  Filled 2013-10-07 (×2): qty 1

## 2013-10-07 MED ORDER — LISINOPRIL-HYDROCHLOROTHIAZIDE 10-12.5 MG PO TABS: 1.0000 | ORAL_TABLET | Freq: Every day | ORAL | Status: DC

## 2013-10-07 NOTE — Progress Notes (Signed)
Nutrition Brief Note  Malnutrition Screening Tool result is inaccurate.  Please consult if nutrition needs are identified.  Levon Hedger MS, RD, LDN [phone] Weekend/After Hours Pager

## 2013-10-07 NOTE — Progress Notes (Signed)
ANTICOAGULATION CONSULT NOTE - Follow Up Consult  Pharmacy Consult for Heparin  Indication: atrial fibrillation  Labs:  Recent Labs  10/06/13 2025 10/07/13 0525 10/07/13 1440  HGB 12.3* 11.4*  --   HCT 35.1* 32.3*  --   PLT 234 208  --   APTT  --  55* 74*  HEPARINUNFRC  --  1.00* 0.79*  CREATININE 1.04  --   --    Assessment: 89 YOM who fell in March and is here for surgical decompression and stabilization of L2 compression fracture. Is on Eliquis PTA for AFib which is a newer diagnosis for the patient. Eliquis is on hold and patient now on heparin and for surgery next week. Heparin is at 950 unitshr and Heparin level= 0.79 (false elevation suspected) and aPTT= 74 (increase from aPTT of 55)  Goal of Therapy:  Heparin level 0.3-0.7 units/ml aPTT 66-102 seconds Monitor platelets by anticoagulation protocol: Yes   Plan:  -No heparin changes needed -Heparin level and aPTT daily for now  Mark Joseph, Pharm D 10/07/2013 3:57 PM

## 2013-10-07 NOTE — Progress Notes (Signed)
Patient ID: Mark Joseph, male   DOB: [birth year], 80 y.o.   MRN: [id number] Feeling good pain well-controlled no new numbness or tingling in his legs  Strength appears to be 5 out of 5 his lower extremities  Continue observation hold anticoagulants probable surgery next week

## 2013-10-07 NOTE — Progress Notes (Addendum)
ANTICOAGULATION CONSULT NOTE - Follow Up Consult  Pharmacy Consult for Heparin  Indication: atrial fibrillation  Labs:  Recent Labs  10/06/13 2025 10/07/13 0525  HGB 12.3* 11.4*  HCT 35.1* 32.3*  PLT 234 208  HEPARINUNFRC  --  1.00*  CREATININE 1.04  --    Assessment: Elevated heparin level likely due to some residual Apixaban activity given that last dose was only ~24 hours ago  Goal of Therapy:  Heparin level 0.3-0.7 units/ml aPTT 66-102 seconds Monitor platelets by anticoagulation protocol: Yes   Plan:  -Will get aPTT to correlate   Abran Duke 10/07/2013,6:20 AM  Addendum 6:55 AM APTT is 55, so HL of 1 was influenced by residual Apixaban activity -Will increase heparin to 950 units/hr -1500 aPTT/HL -Daily aPTT/HL -Monitor for bleeding Wilmer Floor, PharmD

## 2013-10-08 LAB — CBC
HCT: 31 % — ABNORMAL LOW (ref 39.0–52.0)
Hemoglobin: 10.9 g/dL — ABNORMAL LOW (ref 13.0–17.0)
MCH: 32.3 pg (ref 26.0–34.0)
MCHC: 35.2 g/dL (ref 30.0–36.0)
MCV: 92 fL (ref 78.0–100.0)
PLATELETS: 192 10*3/uL (ref 150–400)
RBC: 3.37 MIL/uL — AB (ref 4.22–5.81)
RDW: 13.9 % (ref 11.5–15.5)
WBC: 5.4 10*3/uL (ref 4.0–10.5)

## 2013-10-08 LAB — HEPARIN LEVEL (UNFRACTIONATED): Heparin Unfractionated: 0.76 IU/mL — ABNORMAL HIGH (ref 0.30–0.70)

## 2013-10-08 LAB — APTT: aPTT: 96 seconds — ABNORMAL HIGH (ref 24–37)

## 2013-10-08 MED ORDER — AMIODARONE HCL 100 MG PO TABS
100.0000 mg | ORAL_TABLET | ORAL | Status: DC
  Administered 2013-10-08 – 2013-10-15 (×3): 100 mg via ORAL
  Filled 2013-10-08 (×3): qty 1

## 2013-10-08 MED ORDER — AMIODARONE HCL 200 MG PO TABS
200.0000 mg | ORAL_TABLET | ORAL | Status: DC
  Administered 2013-10-09 – 2013-10-17 (×6): 200 mg via ORAL
  Filled 2013-10-08 (×8): qty 1

## 2013-10-08 MED ORDER — HEPARIN (PORCINE) IN NACL 100-0.45 UNIT/ML-% IJ SOLN
900.0000 [IU]/h | INTRAMUSCULAR | Status: DC
  Administered 2013-10-09 – 2013-10-11 (×2): 900 [IU]/h via INTRAVENOUS
  Filled 2013-10-08 (×8): qty 250

## 2013-10-08 MED ORDER — AMIODARONE HCL 100 MG PO TABS: 100.0000 mg | ORAL_TABLET | ORAL | Status: DC

## 2013-10-08 NOTE — Progress Notes (Signed)
[employer] has brought patient's lumbar brace. Patient to wear it when OOB

## 2013-10-08 NOTE — Progress Notes (Signed)
ANTICOAGULATION CONSULT NOTE - Follow Up Consult  Pharmacy Consult for Heparin  Indication: atrial fibrillation  Labs:  Recent Labs  10/06/13 2025 10/07/13 0525 10/07/13 1440 10/08/13 0520  HGB 12.3* 11.4*  --  10.9*  HCT 35.1* 32.3*  --  31.0*  PLT 234 208  --  192  APTT  --  55* 74* 96*  HEPARINUNFRC  --  1.00* 0.79* 0.76*  CREATININE 1.04  --   --   --      Assessment: 53 YOM who fell in March and is here for surgical decompression and stabilization of L2 compression fracture. Is on Eliquis PTA for AFib which is a newer diagnosis for the patient. Eliquis is on hold and patient now on heparin and for surgery next week. Heparin is at 950 unitshr and Heparin level= 0.76 (slight false elevation suspected) and aPTT= 96 (increase from aPTT of 74)  Goal of Therapy:  Heparin level 0.3-0.7 units/ml aPTT 66-102 seconds Monitor platelets by anticoagulation protocol: Yes   Plan:  -Decrease heparin to 900 units/he -Heparin level and aPTT daily for now  Harland German, Pharm D 10/08/2013 10:13 AM

## 2013-10-08 NOTE — Progress Notes (Signed)
Patient ID: Mark Joseph, male   DOB: March 13, 1922, 80 y.o.   MRN: [id number] BP 135/76  Pulse 64  Temp(Src) 97.8 F (36.6 C) (Oral)  Resp 18  Ht 5\' 9"  (1.753 m)  Wt 67.586 kg (149 lb)  BMI 21.99 kg/m2  SpO2 97% Alert and oriented x 4, speech is clear and fluent Moving all extremities Lumbar pain] Plan on OR Thursday. Continue heparin for atrial fibrillation.  Stable.

## 2013-10-09 ENCOUNTER — Encounter (HOSPITAL_COMMUNITY): Payer: Self-pay | Admitting: General Practice

## 2013-10-09 LAB — CBC
HCT: 33.1 % — ABNORMAL LOW (ref 39.0–52.0)
Hemoglobin: 11.6 g/dL — ABNORMAL LOW (ref 13.0–17.0)
MCH: 32.2 pg (ref 26.0–34.0)
MCHC: 35 g/dL (ref 30.0–36.0)
MCV: 91.9 fL (ref 78.0–100.0)
PLATELETS: 198 10*3/uL (ref 150–400)
RBC: 3.6 MIL/uL — AB (ref 4.22–5.81)
RDW: 13.8 % (ref 11.5–15.5)
WBC: 6.7 10*3/uL (ref 4.0–10.5)

## 2013-10-09 LAB — HEPARIN LEVEL (UNFRACTIONATED): Heparin Unfractionated: 0.52 IU/mL (ref 0.30–0.70)

## 2013-10-09 LAB — APTT: APTT: 85 s — AB (ref 24–37)

## 2013-10-09 NOTE — Progress Notes (Signed)
Patient ID: Mark Joseph, male   DOB: 1922/03/17, 80 y.o.   MRN: [id number] BP 142/87  Pulse 52  Temp(Src) 97.8 F (36.6 C) (Oral)  Resp 18  Ht 5\' 9"  (1.753 m)  Wt 67.586 kg (149 lb)  BMI 21.99 kg/m2  SpO2 95% Alert and oriented x 4 Stable exam Await surgery on Thursday Moving lower extremities well Has been up with brace.

## 2013-10-09 NOTE — Progress Notes (Signed)
ANTICOAGULATION CONSULT NOTE - Follow Up Consult  Pharmacy Consult for Heparin  Indication: atrial fibrillation  Labs:  Recent Labs  10/06/13 2025  10/07/13 0525 10/07/13 1440 10/08/13 0520 10/09/13 0450  HGB 12.3*  --  11.4*  --  10.9* 11.6*  HCT 35.1*  --  32.3*  --  31.0* 33.1*  PLT 234  --  208  --  192 198  APTT  --   < > 55* 74* 96* 85*  HEPARINUNFRC  --   < > 1.00* 0.79* 0.76* 0.52  CREATININE 1.04  --   --   --   --   --   < > = values in this interval not displayed.   Assessment: 21 YOM who fell in March and is here for surgical decompression and stabilization of L2 compression fracture. Is on Eliquis PTA for AFib which is a newer diagnosis for the patient. Eliquis is on hold and patient now on heparin and for surgery next week. Heparin is at 900 unitshr and Heparin level= 0.52  and aPTT= 85  Appears to be good correlation with aPTT and heparin levels, effects of eliquis diminished.  Goal of Therapy:  HL 0.3-0.7 units/ml Monitor platelets by anticoagulation protocol: Yes   Plan:  -Cont heparin at 900 units/hr -Heparin level daily  Talbert Cage, Pharm D 10/09/2013 10:35 AM

## 2013-10-10 LAB — CBC
HCT: 31.5 % — ABNORMAL LOW (ref 39.0–52.0)
Hemoglobin: 11.1 g/dL — ABNORMAL LOW (ref 13.0–17.0)
MCH: 32.3 pg (ref 26.0–34.0)
MCHC: 35.2 g/dL (ref 30.0–36.0)
MCV: 91.6 fL (ref 78.0–100.0)
Platelets: 199 10*3/uL (ref 150–400)
RBC: 3.44 MIL/uL — AB (ref 4.22–5.81)
RDW: 14 % (ref 11.5–15.5)
WBC: 5.4 10*3/uL (ref 4.0–10.5)

## 2013-10-10 LAB — HEPARIN LEVEL (UNFRACTIONATED): HEPARIN UNFRACTIONATED: 0.47 [IU]/mL (ref 0.30–0.70)

## 2013-10-10 NOTE — Progress Notes (Signed)
UR complete.  Laurine Kuyper RN, MSN 

## 2013-10-10 NOTE — Progress Notes (Signed)
ANTICOAGULATION CONSULT NOTE - Follow Up Consult  Pharmacy Consult for Heparin  Indication: atrial fibrillation  Labs:  Recent Labs  10/07/13 1440  10/08/13 0520 10/09/13 0450 10/10/13 0649  HGB  --   < > 10.9* 11.6* 11.1*  HCT  --   --  31.0* 33.1* 31.5*  PLT  --   --  192 198 199  APTT 74*  --  96* 85*  --   HEPARINUNFRC 0.79*  --  0.76* 0.52 0.47  < > = values in this interval not displayed.   Assessment: 64 YOM who fell in March and is here for surgical decompression and stabilization of L2 compression fracture. Is on Eliquis PTA for AFib which is a newer diagnosis for the patient. Eliquis is on hold and patient now on heparin and for surgery next week. Heparin is at 900 unitshr and Heparin level= 0.47   Goal of Therapy:  HL 0.3-0.7 units/ml Monitor platelets by anticoagulation protocol: Yes   Plan:  -Cont heparin at 900 units/hr -Heparin level daily  Mark Joseph, Pharm D 10/10/2013 10:40 AM

## 2013-10-10 NOTE — Progress Notes (Signed)
Patient ID: Mark Joseph, male   DOB: 1922/01/16, 80 y.o.   MRN: [id number] BP 133/61  Pulse 58  Temp(Src) 98 F (36.7 C) (Oral)  Resp 18  Ht 5\' 9"  (1.753 m)  Wt 67.586 kg (149 lb)  BMI 21.99 kg/m2  SpO2 100% Alert and oriented x 4 Moving all extremities well Awaiting surgery on Thursday, continue heparin

## 2013-10-10 NOTE — Care Management Note (Unsigned)
    Page 1 of 1   10/16/2013     2:23:08 PM CARE MANAGEMENT NOTE 10/16/2013  Patient:  BERDELL, HOSTETLER A   Account Number:  [id number]  Date Initiated:  10/10/2013  Documentation initiated by:  ROBARGE,COURTNEY  Subjective/Objective Assessment:   Patient admitted with L2 compression fracture. Patient was taken off Eliquis (atrial fib) and placed on IV Heparin drip for surgery on 10/12/13. Lives at home with spouse.     Action/Plan:   Will follow for discharge needs pending PT/OT evals and physician orders.   Anticipated DC Date:  10/17/2013   Anticipated DC Plan:  IP REHAB FACILITY         Choice offered to / List presented to:             Status of service:   Medicare Important Message given?  YES (If response is "NO", the following Medicare IM given date fields will be blank) Date Medicare IM given:  10/09/2013 Date Additional Medicare IM given:  10/16/2013  Discharge Disposition:    Per UR Regulation:  Reviewed for med. necessity/level of care/duration of stay  If discussed at [place] of Stay Meetings, dates discussed:    Comments:  10/16/13 [place], MSN, CM- Met with patient and wife to provide Medicare IM letter.    10/12/13 [place], MSN, CM- Attempted to provide additional Medicare IM letter, patient unavailable at this time.

## 2013-10-11 ENCOUNTER — Other Ambulatory Visit: Payer: Self-pay | Admitting: Neurosurgery

## 2013-10-11 LAB — CBC
HCT: 32.4 % — ABNORMAL LOW (ref 39.0–52.0)
HEMOGLOBIN: 11.5 g/dL — AB (ref 13.0–17.0)
MCH: 32.5 pg (ref 26.0–34.0)
MCHC: 35.5 g/dL (ref 30.0–36.0)
MCV: 91.5 fL (ref 78.0–100.0)
PLATELETS: 204 10*3/uL (ref 150–400)
RBC: 3.54 MIL/uL — ABNORMAL LOW (ref 4.22–5.81)
RDW: 14 % (ref 11.5–15.5)
WBC: 6.8 10*3/uL (ref 4.0–10.5)

## 2013-10-11 LAB — HEPARIN LEVEL (UNFRACTIONATED): HEPARIN UNFRACTIONATED: 0.38 [IU]/mL (ref 0.30–0.70)

## 2013-10-11 NOTE — Progress Notes (Signed)
Patient ID: Mark Joseph, male   DOB: February 27, 1922, 80 y.o.   MRN: [id number] BP 145/69  Pulse 64  Temp(Src) 97.8 F (36.6 C) (Oral)  Resp 18  Ht 5\' 9"  (1.753 m)  Wt 67.586 kg (149 lb)  BMI 21.99 kg/m2  SpO2 99% Alert and oriented x 4 Moving lower extremities well Surgery for tomorrow. Risks and benefits discussed including but not limited to bleeding, infection,damage to nerve roots, weakness in lower extremities, bowel and or bladder dysfucntion, non union, hardware failure. He understands the need for decompression and removal of the bone fragments from the spinal canal.

## 2013-10-11 NOTE — Progress Notes (Signed)
ANTICOAGULATION CONSULT NOTE - Follow Up Consult  Pharmacy Consult for Heparin  Indication: atrial fibrillation  Labs:  Recent Labs  10/09/13 0450 10/10/13 0649 10/11/13 0452  HGB 11.6* 11.1* 11.5*  HCT 33.1* 31.5* 32.4*  PLT 198 199 204  APTT 85*  --   --   HEPARINUNFRC 0.52 0.47 0.38     Assessment: 89 YOM who fell in March and is here for surgical decompression and stabilization of L2 compression fracture. Is on Eliquis PTA for AFib which is a newer diagnosis for the patient. Eliquis is on hold and patient now on heparin and for surgery next week. Heparin is at 900 unitshr and Heparin level= 0.38  Goal of Therapy:  HL 0.3-0.7 units/ml Monitor platelets by anticoagulation protocol: Yes   Plan:  -Cont heparin at 900 units/hr -Heparin level daily  Cristyn Crossno, Pharm D 10/11/2013 10:40 AM

## 2013-10-12 ENCOUNTER — Encounter (HOSPITAL_COMMUNITY): Payer: Self-pay | Admitting: Critical Care Medicine

## 2013-10-12 ENCOUNTER — Inpatient Hospital Stay (HOSPITAL_COMMUNITY): Payer: Medicare Other | Admitting: Certified Registered Nurse Anesthetist

## 2013-10-12 ENCOUNTER — Inpatient Hospital Stay (HOSPITAL_COMMUNITY): Payer: Medicare Other

## 2013-10-12 ENCOUNTER — Encounter (HOSPITAL_COMMUNITY)
Admission: AD | Disposition: A | Discharge status: Rehabilitation Facility | Payer: Medicare Other | Source: Ambulatory Visit | Attending: Neurosurgery

## 2013-10-12 ENCOUNTER — Encounter (HOSPITAL_COMMUNITY): Payer: Medicare Other | Admitting: Certified Registered Nurse Anesthetist

## 2013-10-12 DIAGNOSIS — S32009A Unspecified fracture of unspecified lumbar vertebra, initial encounter for closed fracture: Secondary | ICD-10-CM | POA: Diagnosis present

## 2013-10-12 HISTORY — PX: ANTERIOR LAT LUMBAR FUSION: SHX1168

## 2013-10-12 LAB — HEPARIN LEVEL (UNFRACTIONATED): Heparin Unfractionated: 0.33 IU/mL (ref 0.30–0.70)

## 2013-10-12 LAB — CBC
HEMATOCRIT: 32.2 % — AB (ref 39.0–52.0)
HEMOGLOBIN: 11.5 g/dL — AB (ref 13.0–17.0)
MCH: 33 pg (ref 26.0–34.0)
MCHC: 35.7 g/dL (ref 30.0–36.0)
MCV: 92.3 fL (ref 78.0–100.0)
Platelets: 209 10*3/uL (ref 150–400)
RBC: 3.49 MIL/uL — ABNORMAL LOW (ref 4.22–5.81)
RDW: 14.3 % (ref 11.5–15.5)
WBC: 6.2 10*3/uL (ref 4.0–10.5)

## 2013-10-12 LAB — ABO/RH: ABO/RH(D): A POS

## 2013-10-12 SURGERY — ANTERIOR LATERAL LUMBAR FUSION 1 LEVEL
Anesthesia: General | Site: Spine Lumbar | Laterality: Left

## 2013-10-12 MED ORDER — SODIUM CHLORIDE 0.9 % IJ SOLN
3.0000 mL | Freq: Two times a day (BID) | INTRAMUSCULAR | Status: DC
  Administered 2013-10-12 – 2013-10-16 (×8): 3 mL via INTRAVENOUS

## 2013-10-12 MED ORDER — FENTANYL CITRATE 0.05 MG/ML IJ SOLN
INTRAMUSCULAR | Status: DC | PRN
  Administered 2013-10-12 (×2): 50 ug via INTRAVENOUS
  Administered 2013-10-12: 100 ug via INTRAVENOUS
  Administered 2013-10-12 (×4): 50 ug via INTRAVENOUS
  Administered 2013-10-12 (×2): 25 ug via INTRAVENOUS

## 2013-10-12 MED ORDER — POTASSIUM CHLORIDE IN NACL 20-0.9 MEQ/L-% IV SOLN
INTRAVENOUS | Status: DC
  Administered 2013-10-12: 75 mL/h via INTRAVENOUS
  Filled 2013-10-12 (×10): qty 1000

## 2013-10-12 MED ORDER — ONDANSETRON HCL 4 MG/2ML IJ SOLN: 4.0000 mg | INTRAMUSCULAR | Status: DC | PRN

## 2013-10-12 MED ORDER — ARTIFICIAL TEARS OP OINT
TOPICAL_OINTMENT | OPHTHALMIC | Status: AC
  Filled 2013-10-12: qty 3.5

## 2013-10-12 MED ORDER — PHENYLEPHRINE HCL 10 MG/ML IJ SOLN
INTRAMUSCULAR | Status: DC | PRN
  Administered 2013-10-12 (×5): 40 ug via INTRAVENOUS

## 2013-10-12 MED ORDER — SODIUM CHLORIDE 0.9 % IJ SOLN
INTRAMUSCULAR | Status: AC
  Filled 2013-10-12: qty 20

## 2013-10-12 MED ORDER — PHENOL 1.4 % MT LIQD: 1.0000 | OROMUCOSAL | Status: DC | PRN

## 2013-10-12 MED ORDER — ALBUMIN HUMAN 5 % IV SOLN
INTRAVENOUS | Status: DC | PRN
  Administered 2013-10-12 (×2): via INTRAVENOUS

## 2013-10-12 MED ORDER — FENTANYL CITRATE 0.05 MG/ML IJ SOLN
INTRAMUSCULAR | Status: AC
  Filled 2013-10-12: qty 5

## 2013-10-12 MED ORDER — OXYCODONE HCL 5 MG PO TABS: 5.0000 mg | ORAL_TABLET | Freq: Once | ORAL | Status: DC | PRN

## 2013-10-12 MED ORDER — SUCCINYLCHOLINE CHLORIDE 20 MG/ML IJ SOLN
INTRAMUSCULAR | Status: DC | PRN
  Administered 2013-10-12: 100 mg via INTRAVENOUS

## 2013-10-12 MED ORDER — MENTHOL 3 MG MT LOZG: 1.0000 | LOZENGE | OROMUCOSAL | Status: DC | PRN

## 2013-10-12 MED ORDER — SODIUM CHLORIDE 0.9 % IJ SOLN
INTRAMUSCULAR | Status: AC
  Filled 2013-10-12: qty 10

## 2013-10-12 MED ORDER — SUCCINYLCHOLINE CHLORIDE 20 MG/ML IJ SOLN
INTRAMUSCULAR | Status: AC
  Filled 2013-10-12: qty 1

## 2013-10-12 MED ORDER — THROMBIN 20000 UNITS EX SOLR
CUTANEOUS | Status: DC | PRN
  Administered 2013-10-12: 14:00:00 via TOPICAL

## 2013-10-12 MED ORDER — HYDROMORPHONE HCL PF 1 MG/ML IJ SOLN: 0.2500 mg | INTRAMUSCULAR | Status: DC | PRN

## 2013-10-12 MED ORDER — CEFAZOLIN SODIUM 1-5 GM-% IV SOLN
1.0000 g | Freq: Three times a day (TID) | INTRAVENOUS | Status: AC
  Administered 2013-10-12 – 2013-10-13 (×2): 1 g via INTRAVENOUS
  Filled 2013-10-12 (×2): qty 50

## 2013-10-12 MED ORDER — OXYCODONE HCL 5 MG PO TABS
ORAL_TABLET | ORAL | Status: AC
  Filled 2013-10-12: qty 1

## 2013-10-12 MED ORDER — SODIUM CHLORIDE 0.9 % IJ SOLN: 3.0000 mL | INTRAMUSCULAR | Status: DC | PRN

## 2013-10-12 MED ORDER — CEFAZOLIN SODIUM-DEXTROSE 2-3 GM-% IV SOLR
INTRAVENOUS | Status: AC
  Administered 2013-10-12: 2 g via INTRAVENOUS
  Filled 2013-10-12: qty 50

## 2013-10-12 MED ORDER — LIDOCAINE HCL (CARDIAC) 20 MG/ML IV SOLN
INTRAVENOUS | Status: DC | PRN
  Administered 2013-10-12: 40 mg via INTRAVENOUS

## 2013-10-12 MED ORDER — PHENYLEPHRINE 40 MCG/ML (10ML) SYRINGE FOR IV PUSH (FOR BLOOD PRESSURE SUPPORT)
PREFILLED_SYRINGE | INTRAVENOUS | Status: AC
  Filled 2013-10-12: qty 10

## 2013-10-12 MED ORDER — SODIUM CHLORIDE 0.9 % IV SOLN: 250.0000 mL | INTRAVENOUS | Status: DC

## 2013-10-12 MED ORDER — PROPOFOL 10 MG/ML IV BOLUS
INTRAVENOUS | Status: AC
  Filled 2013-10-12: qty 20

## 2013-10-12 MED ORDER — PROMETHAZINE HCL 25 MG/ML IJ SOLN: 6.2500 mg | INTRAMUSCULAR | Status: DC | PRN

## 2013-10-12 MED ORDER — OXYCODONE HCL 5 MG/5ML PO SOLN: 5.0000 mg | Freq: Once | ORAL | Status: DC | PRN

## 2013-10-12 MED ORDER — EPHEDRINE SULFATE 50 MG/ML IJ SOLN
INTRAMUSCULAR | Status: AC
  Filled 2013-10-12: qty 2

## 2013-10-12 MED ORDER — LIDOCAINE HCL (CARDIAC) 20 MG/ML IV SOLN
INTRAVENOUS | Status: AC
  Filled 2013-10-12: qty 5

## 2013-10-12 MED ORDER — MORPHINE SULFATE 2 MG/ML IJ SOLN: 1.0000 mg | INTRAMUSCULAR | Status: DC | PRN

## 2013-10-12 MED ORDER — PROPOFOL 10 MG/ML IV BOLUS
INTRAVENOUS | Status: DC | PRN
  Administered 2013-10-12: 150 mg via INTRAVENOUS

## 2013-10-12 MED ORDER — EPHEDRINE SULFATE 50 MG/ML IJ SOLN
INTRAMUSCULAR | Status: DC | PRN
  Administered 2013-10-12 (×4): 5 mg via INTRAVENOUS
  Administered 2013-10-12: 10 mg via INTRAVENOUS
  Administered 2013-10-12 (×4): 5 mg via INTRAVENOUS
  Administered 2013-10-12 (×2): 10 mg via INTRAVENOUS
  Administered 2013-10-12 (×3): 5 mg via INTRAVENOUS
  Administered 2013-10-12: 10 mg via INTRAVENOUS
  Administered 2013-10-12 (×3): 5 mg via INTRAVENOUS
  Administered 2013-10-12: 10 mg via INTRAVENOUS
  Administered 2013-10-12 (×2): 5 mg via INTRAVENOUS

## 2013-10-12 MED ORDER — ONDANSETRON HCL 4 MG/2ML IJ SOLN
INTRAMUSCULAR | Status: DC | PRN
  Administered 2013-10-12: 4 mg via INTRAVENOUS

## 2013-10-12 MED ORDER — 0.9 % SODIUM CHLORIDE (POUR BTL) OPTIME
TOPICAL | Status: DC | PRN
  Administered 2013-10-12 (×2): 1000 mL

## 2013-10-12 MED ORDER — PHENYLEPHRINE HCL 10 MG/ML IJ SOLN
10.0000 mg | INTRAVENOUS | Status: DC | PRN
  Administered 2013-10-12: 10 ug/min via INTRAVENOUS

## 2013-10-12 MED ORDER — EPHEDRINE SULFATE 50 MG/ML IJ SOLN
INTRAMUSCULAR | Status: AC
  Filled 2013-10-12: qty 1

## 2013-10-12 MED ORDER — LACTATED RINGERS IV SOLN
INTRAVENOUS | Status: DC | PRN
Start: 2013-10-12 — End: 2013-10-12
  Administered 2013-10-12 (×4): via INTRAVENOUS

## 2013-10-12 MED ORDER — DIAZEPAM 5 MG PO TABS: 5.0000 mg | ORAL_TABLET | Freq: Four times a day (QID) | ORAL | Status: DC | PRN

## 2013-10-12 MED ORDER — HYDROMORPHONE HCL PF 1 MG/ML IJ SOLN
INTRAMUSCULAR | Status: AC
  Filled 2013-10-12: qty 2

## 2013-10-12 MED ORDER — CEFAZOLIN SODIUM-DEXTROSE 2-3 GM-% IV SOLR
2.0000 g | Freq: Once | INTRAVENOUS | Status: AC
  Administered 2013-10-12: 2 g via INTRAVENOUS
  Filled 2013-10-12: qty 50

## 2013-10-12 MED ORDER — ONDANSETRON HCL 4 MG/2ML IJ SOLN
INTRAMUSCULAR | Status: AC
  Filled 2013-10-12: qty 2

## 2013-10-12 MED ORDER — ROCURONIUM BROMIDE 50 MG/5ML IV SOLN
INTRAVENOUS | Status: AC
  Filled 2013-10-12: qty 1

## 2013-10-12 SURGICAL SUPPLY — 68 items
ADH SKN CLS APL DERMABOND .7 (GAUZE/BANDAGES/DRESSINGS) ×1
BIT DRILL ADJUST TRUSS (BIT) ×2 IMPLANT
BLADE 10 SAFETY STRL DISP (BLADE) ×1 IMPLANT
BLADE ILLUMINATOR MIS (MISCELLANEOUS) ×2 IMPLANT
BLADE SURG ROTATE 9660 (MISCELLANEOUS) IMPLANT
BUR ROUND FLUTED 5 RND (BURR) ×1 IMPLANT
BUR ROUND FLUTED 5MM RND (BURR) ×1
CORE FORTIFY 23-28MM (Neuro Prosthesis/Implant) ×2 IMPLANT
COVER BACK TABLE 24X17X13 BIG (DRAPES) IMPLANT
DERMABOND ADVANCED (GAUZE/BANDAGES/DRESSINGS) ×2
DERMABOND ADVANCED .7 DNX12 (GAUZE/BANDAGES/DRESSINGS) ×1 IMPLANT
DRAPE C-ARM 42X72 X-RAY (DRAPES) ×3 IMPLANT
DRAPE C-ARMOR (DRAPES) ×3 IMPLANT
DRAPE LAPAROTOMY 100X72X124 (DRAPES) ×3 IMPLANT
DRAPE POUCH INSTRU U-SHP 10X18 (DRAPES) ×3 IMPLANT
DURAPREP 26ML APPLICATOR (WOUND CARE) ×3 IMPLANT
ELECT BLADE 4.0 EZ CLEAN MEGAD (MISCELLANEOUS) ×3
ELECT REM PT RETURN 9FT ADLT (ELECTROSURGICAL) ×3
ELECTRODE BLDE 4.0 EZ CLN MEGD (MISCELLANEOUS) IMPLANT
ELECTRODE REM PT RTRN 9FT ADLT (ELECTROSURGICAL) ×1 IMPLANT
FORCEPS BPLR BAYO 10IN 1.0TIP (INSTRUMENTS) ×4 IMPLANT
GAUZE SPONGE 4X4 16PLY XRAY LF (GAUZE/BANDAGES/DRESSINGS) IMPLANT
GLOVE BIOGEL PI IND STRL 7.5 (GLOVE) IMPLANT
GLOVE BIOGEL PI IND STRL 8 (GLOVE) IMPLANT
GLOVE BIOGEL PI INDICATOR 7.5 (GLOVE) ×8
GLOVE BIOGEL PI INDICATOR 8 (GLOVE) ×6
GLOVE ECLIPSE 6.5 STRL STRAW (GLOVE) ×7 IMPLANT
GLOVE ECLIPSE 7.0 STRL STRAW (GLOVE) ×6 IMPLANT
GLOVE ECLIPSE 7.5 STRL STRAW (GLOVE) ×16 IMPLANT
GLOVE EXAM NITRILE LRG STRL (GLOVE) IMPLANT
GLOVE EXAM NITRILE MD LF STRL (GLOVE) IMPLANT
GLOVE EXAM NITRILE XL STR (GLOVE) IMPLANT
GLOVE EXAM NITRILE XS STR PU (GLOVE) IMPLANT
GOWN STRL REUS W/ TWL LRG LVL3 (GOWN DISPOSABLE) IMPLANT
GOWN STRL REUS W/ TWL XL LVL3 (GOWN DISPOSABLE) IMPLANT
GOWN STRL REUS W/TWL 2XL LVL3 (GOWN DISPOSABLE) ×5 IMPLANT
GOWN STRL REUS W/TWL LRG LVL3 (GOWN DISPOSABLE) ×6
GOWN STRL REUS W/TWL XL LVL3 (GOWN DISPOSABLE) ×12
K-WIRE  1.6X 450L (WIRE) ×4
K-WIRE 1.6X 450L (WIRE) ×2
KIT BASIN OR (CUSTOM PROCEDURE TRAY) ×3 IMPLANT
KIT DISP MARS 3V (KITS) ×4 IMPLANT
KIT PEDICLE ACCESS (KITS) ×4 IMPLANT
KIT ROOM TURNOVER OR (KITS) ×3 IMPLANT
KWIRE 1.6X 450L (WIRE) IMPLANT
NDL HYPO 25X1 1.5 SAFETY (NEEDLE) ×1 IMPLANT
NEEDLE HYPO 25X1 1.5 SAFETY (NEEDLE) ×3 IMPLANT
NEURO MONITORING STIM (LABOR (TRAVEL & OVERTIME)) ×2 IMPLANT
NS IRRIG 1000ML POUR BTL (IV SOLUTION) ×3 IMPLANT
PACK LAMINECTOMY NEURO (CUSTOM PROCEDURE TRAY) ×3 IMPLANT
PLATE END LOWER (Plate) ×2 IMPLANT
PLATE END UPPER (Plate) ×2 IMPLANT
SCREW 51MM (Screw) ×4 IMPLANT
SCREW 54MM (Screw) ×4 IMPLANT
SLEEVE SURGEON STRL (DRAPES) ×2 IMPLANT
SPONGE INTESTINAL PEANUT (DISPOSABLE) ×8 IMPLANT
SPONGE LAP 4X18 X RAY DECT (DISPOSABLE) IMPLANT
STRIP BIOACTIVE VITOSS 25X100X (Neuro Prosthesis/Implant) ×2 IMPLANT
SUT VIC AB 2-0 CP2 18 (SUTURE) ×3 IMPLANT
SUT VIC AB 2-0 CT2 18 VCP726D (SUTURE) ×6 IMPLANT
SUT VIC AB 3-0 SH 8-18 (SUTURE) ×5 IMPLANT
SYR 20ML ECCENTRIC (SYRINGE) ×3 IMPLANT
TAPE CLOTH 3X10 TAN LF (GAUZE/BANDAGES/DRESSINGS) ×3 IMPLANT
TEMP PIN ×2 IMPLANT
TOWEL OR 17X24 6PK STRL BLUE (TOWEL DISPOSABLE) ×3 IMPLANT
TOWEL OR 17X26 10 PK STRL BLUE (TOWEL DISPOSABLE) ×3 IMPLANT
TRAY FOLEY CATH 14FRSI W/METER (CATHETERS) ×3 IMPLANT
WATER STERILE IRR 1000ML POUR (IV SOLUTION) ×3 IMPLANT

## 2013-10-12 NOTE — Anesthesia Postprocedure Evaluation (Signed)
  Anesthesia Post-op Note  Patient: Mark Joseph  Procedure(s) Performed: Procedure(s): LUMBAR TWO CORPECTOMY,LUMBAR ONE-LUMBAR THREE ARTHRODESIS. (Left)  Patient Location: ICU  Anesthesia Type:General  Level of Consciousness: awake and alert   Airway and Oxygen Therapy: Patient Spontanous Breathing and Patient connected to face mask oxygen  Post-op Pain: none  Post-op Assessment: Post-op Vital signs reviewed  Post-op Vital Signs: Reviewed  Last Vitals:  Filed Vitals:   10/12/13 2130  BP: 129/77  Pulse: 90  Temp:   Resp: 24    Complications: No apparent anesthesia complications

## 2013-10-12 NOTE — Transfer of Care (Signed)
Immediate Anesthesia Transfer of Care Note  Patient: Mark Joseph  Procedure(s) Performed: Procedure(s): LUMBAR TWO CORPECTOMY,LUMBAR ONE-LUMBAR THREE ARTHRODESIS. (Left)  Patient Location: PACU and NICU  Anesthesia Type:General  Level of Consciousness: awake and alert   Airway & Oxygen Therapy: Patient Spontanous Breathing and Patient connected to face mask oxygen  Post-op Assessment: Report given to PACU RN and Post -op Vital signs reviewed and stable  Post vital signs: Reviewed and stable  Complications: No apparent anesthesia complications

## 2013-10-12 NOTE — Anesthesia Procedure Notes (Signed)
Procedure Name: Intubation Date/Time: 10/12/2013 12:14 PM Performed by: Elon Alas Pre-anesthesia Checklist: Patient identified, Timeout performed, Emergency Drugs available, Suction available and Patient being monitored Patient Re-evaluated:Patient Re-evaluated prior to inductionOxygen Delivery Method: Circle system utilized Preoxygenation: Pre-oxygenation with 100% oxygen Intubation Type: IV induction Ventilation: Mask ventilation without difficulty Laryngoscope Size: Mac and 4 Grade View: Grade I Tube type: Oral Tube size: 7.5 mm Number of attempts: 1 Airway Equipment and Method: Stylet Placement Confirmation: CO2 detector,  positive ETCO2,  ETT inserted through vocal cords under direct vision and breath sounds checked- equal and bilateral Secured at: 23 cm Tube secured with: Tape Dental Injury: Teeth and Oropharynx as per pre-operative assessment

## 2013-10-12 NOTE — Anesthesia Preprocedure Evaluation (Addendum)
Anesthesia Evaluation  Patient identified by MRN, date of birth, ID band Patient awake    Reviewed: Allergy & Precautions, H&P , NPO status , Patient's Chart, lab work & pertinent test results  History of Anesthesia Complications Negative for: history of anesthetic complications  Airway Mallampati: I  Neck ROM: Full    Dental  (+) Teeth Intact, Dental Advisory Given   Pulmonary former smoker,  breath sounds clear to auscultation        Cardiovascular hypertension, Pt. on home beta blockers and Pt. on medications Rhythm:Regular Rate:Normal     Neuro/Psych CVA    GI/Hepatic Neg liver ROS, GERD-  ,  Endo/Other  negative endocrine ROS  Renal/GU negative Renal ROS     Musculoskeletal   Abdominal   Peds  Hematology  (+) Blood dyscrasia, anemia ,   Anesthesia Other Findings   Reproductive/Obstetrics                         Anesthesia Physical Anesthesia Plan  ASA: III  Anesthesia Plan: General   Post-op Pain Management:    Induction: Intravenous  Airway Management Planned: Oral ETT  Additional Equipment:   Intra-op Plan:   Post-operative Plan: Extubation in OR  Informed Consent: I have reviewed the patients History and Physical, chart, labs and discussed the procedure including the risks, benefits and alternatives for the proposed anesthesia with the patient or authorized representative who has indicated his/her understanding and acceptance.   Dental advisory given  Plan Discussed with: CRNA, Anesthesiologist and Surgeon  Anesthesia Plan Comments: (Anemia. Will send T& C)       Anesthesia Quick Evaluation

## 2013-10-12 NOTE — Progress Notes (Signed)
ANTICOAGULATION CONSULT NOTE - Follow Up Consult  Pharmacy Consult for Heparin  Indication: atrial fibrillation  Labs:  Recent Labs  10/10/13 0649 10/11/13 0452 10/12/13 0528  HGB 11.1* 11.5* 11.5*  HCT 31.5* 32.4* 32.2*  PLT 199 204 209  HEPARINUNFRC 0.47 0.38 0.33     Assessment: 89 YOM who fell in March and is here for surgical decompression and stabilization of L2 compression fracture. He is on Eliquis PTA for AFib which is a newer diagnosis for the patient. Eliquis is on hold and patient now on heparin and for surgery today. Heparin is at 900 unitshr and Heparin level= 0.33 this morning.  Goal of Therapy:  HL 0.3-0.7 units/ml Monitor platelets by anticoagulation protocol: Yes   Plan:  - No change for now - f/u plans after surgery  Bayard Hugger, PharmD, BCPS  Clinical Pharmacist  Pager: [phone]   10/12/2013 11:14 AM

## 2013-10-12 NOTE — Op Note (Signed)
10/06/2013 - 10/12/2013  9:28 PM  PATIENT:  Mark Joseph  80 y.o. male  PRE-OPERATIVE DIAGNOSIS:  L2 fracture, spinal canal compression, stenosis  POST-OPERATIVE DIAGNOSIS:  L2 Fracture, same  PROCEDURE:  Procedure(s): LUMBAR TWO CORPECTOMY,LUMBAR ONE-LUMBAR THREE ARTHRODESIS., With a titanium interbody implant (Globus) filled with morselized autograft, allograft via an anterolateral retroperitoneal approach Spinal canal decompression L2 Anterior instrumentation L1-L3, Globus plate and screws Partial resection 12th rib  SURGEON:  Surgeon(s): Carmela Hurt, MD Hewitt Shorts, MD  ASSISTANTS:Nudelman, Molly Maduro  ANESTHESIA:   general  EBL:  Total I/O In: 1300 [I.V.:1300] Out: 90 [Urine:60; Blood:30]  BLOOD ADMINISTERED:none  CELL SAVER GIVEN:none  COUNT:per nursing  DRAINS: none   SPECIMEN:  No Specimen  DICTATION: Mr. Herston was brought to the operating room, intubated and placed under a general anesthetic. He was then positioned in a right lateral decubitus position so that his lumbar spine was lateral. We checked his postioning with fluoroscopy in both the ap and lateral planes and were satisfied that his body was square on the operating table. He was then secured to the table with tape. His abdomen, back, and flank were prepped then draped in a sterile manner. Using a Kwire I planned my incision in order to provide access to the L1,2, and L3 vertebral bodies, with fluoroscopic guidance. I then made a curvilinear incision starting above the iliac crest and extending superiorly over the 12 rib with a 10 blade.  I identified and dissected out the 12th rib with a Doyenne, then removed the lateral portion of the rib to gain access to the deeper musculature. I then used monopolar cautery to cut through the external oblique muscle and the transversus abdominus. I the with blunt dissection created a space in the retroperitoneal space to create a corridor to the lumbar spine. With  fluoroscopic guidance I was able to dock a K wire into the L2 vertebral body. The with a series of dilators exposed the L2 vertebral body.  I checked the position of the dilators to the motor nerves within the psoas muscle and verified I was in a safe position. I then set up the Mars Anterior retractor(globus) and placed four blades and fully exposed the inferior portion of L1, superior portion of L3, and the anterior and posterior extent of the vertebrae.  I decompressed the spinal canal via a corpectomy of the L2 compression fracture, and discetomies of L1/2, and L2/3. I did this with Dr. Earl Gala help. We used rongeurs, drills, Kerrison punches, dissectors, nerve hooks, and other tools to remove the disc, endplates of L1, and L3, and to expose the spinal canal and thecal sac. The bone removal was done carefully as the bone was quite stuck within the canal.  Once the decompression was complete the arthrodesis was next. We measured the space, and checked with fluoroscopy before assembling the modular implant. I used 4 degrees of lordosis on the L1, and L3 vertebral enplates and expanded the construct to approximately 93mm. We filled the cavity of the device with morselized autograft from the rib, and morselized allograft(vitoss), also packing more bone and allograft anterior to the construct. We checked the position with fluoro and it was good.  We then placed a plate on L1, and L3 placing 2 screws in each body using bicortical purchase-. Final xray showed all screws to be in good position. We then irrigated, removed the retractor system. There was no violation of the peritoneum. We then closed in layers using vicryl sutures  to approximate the muscular planes, and the subcutaneous and subcuticular layers. I used Dermabond for my dressing.   PLAN OF CARE: Admit to inpatient   PATIENT DISPOSITION:  PACU - hemodynamically stable.   Delay start of Pharmacological VTE agent (>24hrs) due to surgical blood loss  or risk of bleeding:  yes

## 2013-10-12 NOTE — Progress Notes (Signed)
Patient left for the OR. Family at bedside.  Sim Boast,  RN

## 2013-10-13 DIAGNOSIS — W19XXXA Unspecified fall, initial encounter: Secondary | ICD-10-CM

## 2013-10-13 DIAGNOSIS — S32009A Unspecified fracture of unspecified lumbar vertebra, initial encounter for closed fracture: Secondary | ICD-10-CM

## 2013-10-13 LAB — POCT I-STAT 7, (LYTES, BLD GAS, ICA,H+H)
Acid-Base Excess: 5 mmol/L — ABNORMAL HIGH (ref 0.0–2.0)
Bicarbonate: 30.4 mEq/L — ABNORMAL HIGH (ref 20.0–24.0)
Calcium, Ion: 1.18 mmol/L (ref 1.13–1.30)
HCT: 28 % — ABNORMAL LOW (ref 39.0–52.0)
HEMOGLOBIN: 9.5 g/dL — AB (ref 13.0–17.0)
O2 Saturation: 100 %
PCO2 ART: 45.8 mmHg — AB (ref 35.0–45.0)
PO2 ART: 302 mmHg — AB (ref 80.0–100.0)
Potassium: 3.6 mEq/L — ABNORMAL LOW (ref 3.7–5.3)
Sodium: 137 mEq/L (ref 137–147)
TCO2: 32 mmol/L (ref 0–100)
pH, Arterial: 7.431 (ref 7.350–7.450)

## 2013-10-13 MED ORDER — BIOTENE DRY MOUTH MT LIQD
15.0000 mL | Freq: Two times a day (BID) | OROMUCOSAL | Status: DC
  Administered 2013-10-13: 15 mL via OROMUCOSAL

## 2013-10-13 NOTE — Progress Notes (Signed)
UR completed. Await PT/OT evals to determine pt's needs for d/c.   Carlyle Lipa, RN BSN MHA CCM Trauma/Neuro ICU Case Manager [phone]

## 2013-10-13 NOTE — Evaluation (Signed)
Physical Therapy Evaluation Patient Details Name: Mark Joseph MRN: [id number] DOB: 07/11/1921 Today's Date: 10/13/2013   History of Present Illness  whom fell in March of 2015. Xray identified L2 compression fracture, no retropulsion. He was admitted for pain, and new onset atrial fibrillation. He had a prolonged stay in the hospital and rehab. Since discharge he has had increasing difficulty with back pain, walking, and urinary incontinence. Patient is now s/p LUMBAR TWO CORPECTOMY,LUMBAR ONE-LUMBAR THREE ARTHRODESIS.   Clinical Impression  Patient demonstrates deficits in mobility as indicated below. Patient will benefit from continued skilled Pt to address deficits and maximize function. Will see as indicated and progress as tolerated. Recommend CIR upon acute discharge at this time. Patient with good family support and demonstrates good motivation at this time.     Follow Up Recommendations CIR    Equipment Recommendations  None recommended by PT    Recommendations for Other Services Rehab consult     Precautions / Restrictions Precautions Precautions: Back Precaution Comments: verbally reviewed with patient Required Braces or Orthoses: Spinal Brace Spinal Brace: Lumbar corset Restrictions Weight Bearing Restrictions: No      Mobility  Bed Mobility Overal bed mobility: Needs Assistance Bed Mobility: Rolling;Sidelying to Sit Rolling: Mod assist Sidelying to sit: Max assist       General bed mobility comments: Assist for rolling, patient with pain when rolling because of incision, hesitant to come to sidelying, assist to upright, max assist for trunk support and rotation to EOB.  Transfers Overall transfer level: Needs assistance Equipment used: Rolling walker (2 wheeled) Transfers: Sit to/from [employer] CorporationStand;Stand Pivot Transfers Sit to Stand: Mod assist Stand pivot transfers: Mod assist       General transfer comment: assist for stability, patient with small shuffling  steps narrow support  Ambulation/Gait Ambulation/Gait assistance: Mod assist Ambulation Distance (Feet): 6 Feet Assistive device: Rolling walker (2 wheeled)       General Gait Details: pivotal steps to chair  Stairs            Wheelchair Mobility    Modified Rankin (Stroke Patients Only)       Balance                                             Pertinent Vitals/Pain "Soreness"  Reported.  VSS, BP stable throughout session    Home Living Family/patient expects to be discharged to:: Private residence Living Arrangements: Spouse/significant other Available Help at Discharge: Family;Available 24 hours/day Type of Home: House Home Access: Stairs to enter Entrance Stairs-Rails: None Entrance Stairs-Number of Steps: 2 Home Layout: Two level;Able to live on main level with bedroom/bathroom Home Equipment: Bedside commode;Cane - quad;Cane - single point;Walker - 2 wheels      Prior Function Level of Independence: Independent         Comments: Prior to initial fall, pt was independent, driving, [facility] (since fall was at rehab and then recieving HHPT)     Hand Dominance   Dominant Hand: Right    Extremity/Trunk Assessment   Upper Extremity Assessment: Defer to OT evaluation           Lower Extremity Assessment: Generalized weakness;RLE deficits/detail RLE Deficits / Details: significant weakness related to R knee problems (2/5)       Communication   Communication: No difficulties;HOH  Cognition Arousal/Alertness: Awake/alert Behavior During Therapy: [facility]FL  for tasks assessed/performed Overall Cognitive Status: Within Functional Limits for tasks assessed                      General Comments General comments (skin integrity, edema, etc.): discussed mobility expectations with patient in addition to verbally reviewing back precautions with patient. Educated patient on OOB in chair 30-45 mins and discussed  possibility of further rehab prior to dc home.     Exercises        Assessment/Plan    PT Assessment Patient needs continued PT services  PT Diagnosis Difficulty walking;Abnormality of gait;Generalized weakness;Acute pain   PT Problem List Decreased strength;Decreased range of motion;Decreased activity tolerance;Decreased balance;Decreased mobility;Pain  PT Treatment Interventions DME instruction;Gait training;Stair training;Functional mobility training;Therapeutic activities;Therapeutic exercise;Balance training;Patient/family education   PT Goals (Current goals can be found in the Care Plan section) Acute Rehab PT Goals Patient Stated Goal: to get better PT Goal Formulation: With patient/family Time For Goal Achievement: 10/27/13 Potential to Achieve Goals: Good    Frequency Min 5X/week   Barriers to discharge        Co-evaluation               End of Session Equipment Utilized During Treatment: Gait belt;Back brace Activity Tolerance: Patient tolerated treatment well;Patient limited by pain Patient left: in chair;with call bell/phone within reach;with family/visitor present Nurse Communication: Mobility status         Time: 3570-1779 PT Time Calculation (min): 25 min   Charges:   PT Evaluation $Initial PT Evaluation Tier I: 1 Procedure PT Treatments $Therapeutic Activity: 8-22 mins $Self Care/Home Management: 8-22   PT G Codes:          Fabio Asa 10/13/2013, 11:53 AM Charlotte Crumb, PT DPT  [phone]

## 2013-10-13 NOTE — Progress Notes (Signed)
Occupational Therapy Evaluation Patient Details Name: Mark Joseph MRN: [id number] DOB: 07/11/1921 Today's Date: 10/13/2013    History of Present Illness 80 yo who fell in March of 2015. Xray identified L2 compression fracture, no retropulsion. He was admitted for pain, and new onset atrial fibrillation. He had a prolonged stay in the hospital and rehab. Since discharge he has had increasing difficulty with back pain, walking, and urinary incontinence. Patient is now s/p LUMBAR TWO CORPECTOMY,LUMBAR ONE-LUMBAR THREE ARTHRODESIS.    Clinical Impression   PTA, pt was mod I with ADL and mobility, although required assistance at times due to recent fall. Feel pt is an excellent CIR candidate. Very supportive family. Will follow acutely to facilitate D/C to CIR. Pt orthostatic in standing and c/o dizziness.    Follow Up Recommendations  CIR;Supervision/Assistance - 24 hour    Equipment Recommendations  None recommended by OT    Recommendations for Other Services Rehab consult     Precautions / Restrictions Precautions Precautions: Back Precaution Comments: verbally reviewed with patient Required Braces or Orthoses: Spinal Brace Spinal Brace: Lumbar corset Restrictions Weight Bearing Restrictions: No      Mobility Bed Mobility Overal bed mobility: Needs Assistance Bed Mobility: Rolling;Sit to Sidelying;Sit to Supine Rolling: Mod assist Sidelying to sit: Max assist     Sit to sidelying: Max assist General bed mobility comments: c/o pain in sidelying. Max A to assist with trunk and BLE  Transfers Overall transfer level: Needs assistance Equipment used: Rolling walker (2 wheeled) Transfers: Sit to/from [employer] CorporationStand;Stand Pivot Transfers Sit to Stand: Mod assist Stand pivot transfers: Mod assist       General transfer comment: c/o dizziness in standing.Poor upright posture    Balance Overall balance assessment: Needs assistance Sitting-balance support: Feet supported;Bilateral  upper extremity supported Sitting balance-Leahy Scale: Poor Sitting balance - Comments: falling to R occasionally   Standing balance support: Bilateral upper extremity supported Standing balance-Leahy Scale: Poor Standing balance comment: poor ability to maintain upright standing posture                            ADL Overall ADL's : Needs assistance/impaired Eating/Feeding: Modified independent   Grooming: Minimal assistance   Upper Body Bathing: Minimal assitance;Sitting   Lower Body Bathing: Maximal assistance   Upper Body Dressing : Minimal assistance   Lower Body Dressing: Maximal assistance   Toilet Transfer: Moderate assistance;Stand-pivot   Toileting- Clothing Manipulation and Hygiene: Maximal assistance       Functional mobility during ADLs: Moderate assistance (stand pivot) General ADL Comments: Began educating pt on backprecautions     Vision                     Perception     Praxis      Pertinent Vitals/Pain C/o L incisional pain See vitals.     Hand Dominance Right   Extremity/Trunk Assessment Upper Extremity Assessment Upper Extremity Assessment: Generalized weakness   Lower Extremity Assessment Lower Extremity Assessment: Defer to PT evaluation RLE Deficits / Details: significant weakness related to R knee problems (2/5) RLE: Unable to fully assess due to pain   Cervical / Trunk Assessment Cervical / Trunk Assessment: Kyphotic   Communication Communication Communication: No difficulties;HOH   Cognition Arousal/Alertness: Awake/alert Behavior During Therapy: WFL for tasks assessed/performed Overall Cognitive Status: Within Functional Limits for tasks assessed  General Comments       Exercises       Shoulder Instructions      Home Living Family/patient expects to be discharged to:: Private residence Living Arrangements: Spouse/significant other Available Help at Discharge:  Family;Available 24 hours/day Type of Home: House Home Access: Stairs to enter [employer] of Steps: 2 Entrance Stairs-Rails: None Home Layout: Two level;Able to live on main level with bedroom/bathroom     Bathroom Shower/Tub: Producer, television/film/video: Standard Bathroom Accessibility: Yes How Accessible: Accessible via walker Home Equipment: Bedside commode;Cane - quad;Cane - single point;Walker - 2 wheels          Prior Functioning/Environment Level of Independence: Independent        Comments: Prior to initial fall, pt was independent, driving, going to the [facility] daily. since fall, pt has been having more difficulty with basic self care. (since fall was at rehab and then recieving HHPT)    OT Diagnosis: Generalized weakness;Acute pain   OT Problem List: Decreased strength;Decreased activity tolerance;Impaired balance (sitting and/or standing);Decreased safety awareness;Decreased knowledge of use of DME or AE;Decreased knowledge of precautions;Cardiopulmonary status limiting activity;Pain   OT Treatment/Interventions: Self-care/ADL training;Therapeutic exercise;Energy conservation;DME and/or AE instruction;Therapeutic activities;Patient/family education;Balance training    OT Goals(Current goals can be found in the care plan section) Acute Rehab OT Goals Patient Stated Goal: to get better OT Goal Formulation: With patient Time For Goal Achievement: 10/27/13 Potential to Achieve Goals: Good ADL Goals Pt Will Perform Lower Body Bathing: with min assist;sit to/from stand;with adaptive equipment Pt Will Perform Lower Body Dressing: with min assist;with adaptive equipment;sit to/from stand Pt Will Transfer to Toilet: with min assist;bedside commode;ambulating Pt Will Perform Toileting - Clothing Manipulation and hygiene: with min assist;sit to/from stand Additional ADL Goal #1: Pt will verbalize 3/3 back precautions  independently  OT Frequency: Min 2X/week    Barriers to D/C:            Co-evaluation              End of Session Equipment Utilized During Treatment: Gait belt;Rolling walker;Back brace Nurse Communication: Mobility status;Precautions  Activity Tolerance: Patient limited by fatigue;Patient limited by pain Patient left: in bed;with call bell/phone within reach;with family/visitor present   Time: 1120-1145 OT Time Calculation (min): 25 min Charges:  OT General Charges $OT Visit: 1 Procedure OT Evaluation $Initial OT Evaluation Tier I: 1 Procedure OT Treatments $Self Care/Home Management : 8-22 mins G-Codes:    Mark Joseph 10-28-2013, 12:36 PM   Luisa Dago, OTR/L  [phone] October 28, 2013

## 2013-10-13 NOTE — Progress Notes (Signed)
Rehab Admissions Coordinator Note:  Patient was screened by Clois Dupes for appropriateness for an Inpatient Acute Rehab Consult per PT recommendation.   At this time, we are recommending Inpatient Rehab consult. I will place order. Pt previously received inpt rehab services 07/2013.  [facility] 10/13/2013, 11:59 AM  I can be reached at [phone].

## 2013-10-13 NOTE — Consult Note (Signed)
Physical Medicine and Rehabilitation Consult  Reason for Consult: Lumbar L2 fracture with spinal canal compression  Referring Physician: Dr. Cabell  HPI: Mark Joseph is a 80 y.o. right-handed male with history of CVA September 2013. Patient well-known rehabilitation services 07/27/2013 secondary to deconditioning related to A. fib with RVR as well as recent L2 compression fracture after a fall with conservative care. He was discharged home from rehabilitation services ambulating 200 feet with her walker and minimal assistance. Presented 10/06/2013 with increasing back pain and urinary incontinence. MRI of lumbar spine showed severe compression and retropulsion of known L2 compression fracture. Underwent lumbar 2 polypectomy, lumbar L1-3 arthrodesis 10/12/2013 per Dr. Cabell. Postoperative pain management. Lumbar corset when out of bed. Patient on Eliquis for history of atrial fibrillation RVR prior to admission await plan to resume. Physical therapy evaluation completed 10/13/2013 with recommendations of physical medicine rehabilitation consult.  Review of Systems  Cardiovascular: Positive for palpitations.  Genitourinary: Positive for urgency and frequency.  Musculoskeletal: Positive for back pain and myalgias.  All other systems reviewed and are negative.   Past Medical History   Diagnosis  Date   .  Hypertension    .  GERD (gastroesophageal reflux disease)    .  Osteoporosis    .  High cholesterol    .  Stroke  01/2012     "small; affected his speech; pretty much back to normal now" (09/15/2012)   .  Pneumonitis  1971   .  Arthritis      "knees; right is worse" (09/15/2012)    Past Surgical History   Procedure  Laterality  Date   .  Appendectomy   1953   .  Inguinal hernia repair  Bilateral  09/15/2012     w/mesh /notes 09/15/2012   .  Inguinal hernia repair  Bilateral  09/15/2012     Procedure: HERNIA REPAIR INGUINAL ADULT BILATERAL; Surgeon: Matthew Wakefield, MD; Location: [facility] OR; Service:  General; Laterality: Bilateral;   .  Insertion of mesh  Bilateral  09/15/2012     Procedure: INSERTION OF MESH; Surgeon: Matthew Wakefield, MD; Location: [facility] OR; Service: General; Laterality: Bilateral;    History reviewed. No pertinent family history.  Social History: reports that he quit smoking about 45 years ago. His smoking use included Cigarettes. He has a 12.5 pack-year smoking history. He has never used smokeless tobacco. He reports that he does not drink alcohol or use illicit drugs.  Allergies: No Known Allergies  Medications Prior to Admission   Medication  Sig  Dispense  Refill   .  alendronate (FOSAMAX) 70 MG tablet  Take 70 mg by mouth every Sunday. Take with a full glass of water on an empty stomach.     .  amiodarone (PACERONE) 200 MG tablet  Take 100-200 mg by mouth daily. *takes 200mg daily Monday through Friday, but takes 100mg daily Saturday and Sunday*     .  apixaban (ELIQUIS) 5 MG TABS tablet  Take 5 mg by mouth 2 (two) times daily.     .  CALCIUM PO  Take 1 tablet by mouth 2 (two) times daily.     .  ciprofloxacin (CIPRO) 500 MG tablet  Take 500 mg by mouth 2 (two) times daily.     .  finasteride (PROSCAR) 5 MG tablet  Take 5 mg by mouth daily.     .  lisinopril-hydrochlorothiazide (PRINZIDE,ZESTORETIC) 10-12.5 MG per tablet  Take 1 tablet by mouth daily.     .    metoprolol tartrate (LOPRESSOR) 25 MG tablet  Take 12.5 mg by mouth 2 (two) times daily.     .  pantoprazole (PROTONIX) 40 MG tablet  Take 40 mg by mouth daily.     .  polyethylene glycol (MIRALAX / GLYCOLAX) packet  Take 17 g by mouth daily as needed for mild constipation.     .  pravastatin (PRAVACHOL) 20 MG tablet  Take 20 mg by mouth every evening.     .  selenium 50 MCG TABS tablet  Take 200 mcg by mouth daily.     .  tamsulosin (FLOMAX) 0.4 MG CAPS capsule  Take 0.4 mg by mouth daily after breakfast.      Home:  Home Living  Family/patient expects to be discharged to:: Private residence  Living  Arrangements: Spouse/significant other  Available Help at Discharge: Family;Available 24 hours/day  Type of Home: House  Home Access: Stairs to enter  Entrance Stairs-Number of Steps: 2  Entrance Stairs-Rails: None  Home Layout: Two level;Able to live on main level with bedroom/bathroom  Home Equipment: Bedside commode;Cane - quad;Cane - single point;Walker - 2 wheels  Functional History:  Prior Function  Level of Independence: Independent  Comments: Prior to initial fall, pt was independent, driving, going to the [employer] daily (since fall was at rehab and then recieving HHPT)  Functional Status:  Mobility:  Bed Mobility  Overal bed mobility: Needs Assistance  Bed Mobility: Rolling;Sidelying to Sit  Rolling: Mod assist  Sidelying to sit: Max assist  General bed mobility comments: Assist for rolling, patient with pain when rolling because of incision, hesitant to come to sidelying, assist to upright, max assist for trunk support and rotation to EOB.  Transfers  Overall transfer level: Needs assistance  Equipment used: Rolling walker (2 wheeled)  Transfers: Sit to/from Stand;Stand Pivot Transfers  Sit to Stand: Mod assist  Stand pivot transfers: Mod assist  General transfer comment: assist for stability, patient with small shuffling steps narrow support  Ambulation/Gait  Ambulation/Gait assistance: Mod assist  Ambulation Distance (Feet): 6 Feet  Assistive device: Rolling walker (2 wheeled)  General Gait Details: pivotal steps to chair   ADL:   Cognition:  Cognition  Overall Cognitive Status: Within Functional Limits for tasks assessed  Orientation Level: Oriented X4  Cognition  Arousal/Alertness: Awake/alert  Behavior During Therapy: WFL for tasks assessed/performed  Overall Cognitive Status: Within Functional Limits for tasks assessed  Blood pressure 125/71, pulse 93, temperature 97.2 F (36.2 C), temperature source Oral, resp. rate 24, height 5' 9" (1.753 m), weight 67.586 kg  (149 lb), SpO2 100.00%.  Physical Exam  Vitals reviewed.  Constitutional: He is oriented to person, place, and time. He appears well-developed.  HENT:  Head: Normocephalic.  Eyes: EOM are normal.  Neck: Normal range of motion. Neck supple. No thyromegaly present.  Cardiovascular:  Cardiac rate controlled  Respiratory: Effort normal and breath sounds normal. No respiratory distress.  GI: Soft. Bowel sounds are normal. He exhibits no distension.  Neurological: He is alert and oriented to person, place, and time.  UE's grossly 4/5. LE's: 2+ RHF, 2- LF, 3- KE and 4/5 ankles. No gross sensory deficits. Proximal movement limited by pain. LSO at bedside  Skin:  Back incision with dressing  Psychiatric: He has a normal mood and affect. His behavior is normal. Thought content normal.   Results for orders placed during the hospital encounter of 10/06/13 (from the past 24 hour(s))   TYPE AND SCREEN Status: None      Collection Time    10/12/13 12:25 PM   Result  Value  Ref Range    ABO/RH(D)  A POS     Antibody Screen  NEG     Sample Expiration  10/15/2013     Unit Number  [id number]     Blood Component Type  RED CELLS,LR     Unit division  00     Status of Unit  ALLOCATED     Transfusion Status  OK TO TRANSFUSE     Crossmatch Result  Compatible     Unit Number  [id number]     Blood Component Type  RED CELLS,LR     Unit division  00     Status of Unit  ALLOCATED     Transfusion Status  OK TO TRANSFUSE     Crossmatch Result  Compatible    ABO/RH Status: None    Collection Time    10/12/13 12:25 PM   Result  Value  Ref Range    ABO/RH(D)  A POS    POCT I-STAT 7, (LYTES, BLD GAS, ICA,H+H) Status: Abnormal    Collection Time    10/12/13 6:40 PM   Result  Value  Ref Range    pH, Arterial  7.431  7.350 - 7.450    pCO2 arterial  45.8 (*)  35.0 - 45.0 mmHg    pO2, Arterial  302.0 (*)  80.0 - 100.0 mmHg    Bicarbonate  30.4 (*)  20.0 - 24.0 mEq/L    TCO2  32  0 - 100 mmol/L    O2  Saturation  100.0     Acid-Base Excess  5.0 (*)  0.0 - 2.0 mmol/L    Sodium  137  137 - 147 mEq/L    Potassium  3.6 (*)  3.7 - 5.3 mEq/L    Calcium, Ion  1.18  1.13 - 1.30 mmol/L    HCT  28.0 (*)  39.0 - 52.0 %    Hemoglobin  9.5 (*)  13.0 - 17.0 g/dL    Patient temperature  37.4 C     Sample type  ARTERIAL     Dg Lumbar Spine 2-3 Views  10/12/2013 CLINICAL DATA: L2 corpectomy EXAM: DG C-ARM GT 120 MIN; LUMBAR SPINE - 2-3 VIEW FLUOROSCOPY TIME: 2 minutes 11 seconds COMPARISON: Lumbar spine MRI - 10/05/2013 FINDINGS: Three spot intraoperative radiographic images of the lumbar spine are provided for review. Initial image demonstrates a marking instrument overlying the previously noted severe (greater than 70%) compression deformity / vertebral plana of the L2 vertebral body. Subsequent images demonstrate at least partial resection of the L2 vertebral body with placement of a vertebral body spacer prosthesis and left-sided side plate fixation with transfixing cancellous screws seen at L1 and L3. No evidence of hardware failure or loosening. There is restoration of the L2 vertebral body space height. IMPRESSION: Post L2 vertebral body replacement and sideplate fixation without evidence of complication. Electronically Signed By: John Watts M.D. On: 10/12/2013 21:18  Dg C-arm Gt 120 Min  10/12/2013 CLINICAL DATA: L2 corpectomy EXAM: DG C-ARM GT 120 MIN; LUMBAR SPINE - 2-3 VIEW FLUOROSCOPY TIME: 2 minutes 11 seconds COMPARISON: Lumbar spine MRI - 10/05/2013 FINDINGS: Three spot intraoperative radiographic images of the lumbar spine are provided for review. Initial image demonstrates a marking instrument overlying the previously noted severe (greater than 70%) compression deformity / vertebral plana of the L2 vertebral body. Subsequent images demonstrate at least partial resection of the L2 vertebral body with   placement of a vertebral body spacer prosthesis and left-sided side plate fixation with transfixing  cancellous screws seen at L1 and L3. No evidence of hardware failure or loosening. There is restoration of the L2 vertebral body space height. IMPRESSION: Post L2 vertebral body replacement and sideplate fixation without evidence of complication. Electronically Signed By: John Watts M.D. On: 10/12/2013 21:18   Assessment/Plan:  Diagnosis: L2 compression fx s/p fusion  1. Does the need for close, 24 hr/day medical supervision in concert with the patient's rehab needs make it unreasonable for this patient to be served in a less intensive setting? Yes 2. Co-Morbidities requiring supervision/potential complications: previous CVA, htn 3. Due to bladder management, bowel management, safety, skin/wound care, disease management, medication administration, pain management and patient education, does the patient require 24 hr/day rehab nursing? Yes 4. Does the patient require coordinated care of a physician, rehab nurse, PT (1-2 hrs/day, 5 days/week) and OT (1-2 hrs/day, 5 days/week) to address physical and functional deficits in the context of the above medical diagnosis(es)? Yes Addressing deficits in the following areas: balance, endurance, locomotion, strength, transferring, bowel/bladder control, bathing, dressing, feeding, grooming, toileting and psychosocial support 5. Can the patient actively participate in an intensive therapy program of at least 3 hrs of therapy per day at least 5 days per week? Yes 6. The potential for patient to make measurable gains while on inpatient rehab is good 7. Anticipated functional outcomes upon discharge from inpatient rehab are supervision with PT, supervision and min assist with OT, n/a with SLP. 8. Estimated rehab length of stay to reach the above functional goals is: 10-15 days 9. Does the patient have adequate social supports to accommodate these discharge functional goals? Yes 10. Anticipated D/C setting: Home 11. Anticipated post D/C treatments: HH therapy and  Outpatient therapy 12. Overall Rehab/Functional Prognosis: excellent RECOMMENDATIONS:  This patient's condition is appropriate for continued rehabilitative care in the following setting: CIR  Patient has agreed to participate in recommended program. Yes  Note that insurance prior authorization may be required for reimbursement for recommended care.  Comment: Rehab Admissions Coordinator to follow up.  Thanks,  Zachary T. Swartz, MD, FAAPMR  10/13/2013     

## 2013-10-14 LAB — BLOOD PRODUCT ORDER (VERBAL) VERIFICATION

## 2013-10-14 MED ORDER — MAGIC MOUTHWASH
15.0000 mL | Freq: Three times a day (TID) | ORAL | Status: DC | PRN
  Filled 2013-10-14: qty 15

## 2013-10-14 NOTE — Progress Notes (Signed)
Subjective: Patient reports Reasonably comfortable. Mobilizing fairly. Back pain tolerable  Objective: Vital signs in last 24 hours: Temp:  [97.3 F (36.3 C)-98.2 F (36.8 C)] 97.9 F (36.6 C) (06/06 0957) Pulse Rate:  [76-94] 80 (06/06 0957) Resp:  [13-24] 18 (06/06 0957) BP: (100-130)/(52-82) 100/52 mmHg (06/06 0957) SpO2:  [94 %-100 %] 96 % (06/06 0957)  Intake/Output from previous day: 06/05 0701 - 06/06 0700 In: 1075 [P.O.:600; I.V.:475] Out: 1175 [Urine:1175] Intake/Output this shift:    Motor function appears intact in lower extremities  Lab Results:  Recent Labs  10/12/13 0528 10/12/13 1840  WBC 6.2  --   HGB 11.5* 9.5*  HCT 32.2* 28.0*  PLT 209  --    BMET  Recent Labs  10/12/13 1840  NA 137  K 3.6*    Studies/Results: Dg Lumbar Spine 2-3 Views  10/12/2013   CLINICAL DATA:  L2 corpectomy  EXAM: DG C-ARM GT 120 MIN; LUMBAR SPINE - 2-3 VIEW  FLUOROSCOPY TIME:  2 minutes 11 seconds  COMPARISON:  Lumbar spine MRI - 10/05/2013  FINDINGS: Three spot intraoperative radiographic images of the lumbar spine are provided for review.  Initial image demonstrates a marking instrument overlying the previously noted severe (greater than 70%) compression deformity / vertebral plana of the L2 vertebral body.  Subsequent images demonstrate at least partial resection of the L2 vertebral body with placement of a vertebral body spacer prosthesis and left-sided side plate fixation with transfixing cancellous screws seen at L1 and L3. No evidence of hardware failure or loosening. There is restoration of the L2 vertebral body space height.  IMPRESSION: Post L2 vertebral body replacement and sideplate fixation without evidence of complication.   Electronically Signed   By: Simonne Come M.D.   On: 10/12/2013 21:18   Dg C-arm Gt 120 Min  10/12/2013   CLINICAL DATA:  L2 corpectomy  EXAM: DG C-ARM GT 120 MIN; LUMBAR SPINE - 2-3 VIEW  FLUOROSCOPY TIME:  2 minutes 11 seconds  COMPARISON:   Lumbar spine MRI - 10/05/2013  FINDINGS: Three spot intraoperative radiographic images of the lumbar spine are provided for review.  Initial image demonstrates a marking instrument overlying the previously noted severe (greater than 70%) compression deformity / vertebral plana of the L2 vertebral body.  Subsequent images demonstrate at least partial resection of the L2 vertebral body with placement of a vertebral body spacer prosthesis and left-sided side plate fixation with transfixing cancellous screws seen at L1 and L3. No evidence of hardware failure or loosening. There is restoration of the L2 vertebral body space height.  IMPRESSION: Post L2 vertebral body replacement and sideplate fixation without evidence of complication.   Electronically Signed   By: Simonne Come M.D.   On: 10/12/2013 21:18    Assessment/Plan: Stable postop.  LOS: 8 days  PTOT for further mobilization.   Barnett Abu 10/14/2013, 10:19 AM

## 2013-10-14 NOTE — Progress Notes (Signed)
Physical Therapy Treatment Patient Details Name: Mark Joseph MRN: [id number] DOB: 12-Dec-1921 Today's Date: 10/14/2013    History of Present Illness 80 yo who fell in March of 2015. Xray identified L2 compression fracture, no retropulsion. He was admitted for pain, and new onset atrial fibrillation. He had a prolonged stay in the hospital and rehab. Since discharge he has had increasing difficulty with back pain, walking, and urinary incontinence. Patient is now s/p LUMBAR TWO CORPECTOMY,LUMBAR ONE-LUMBAR THREE ARTHRODESIS.     PT Comments    Pt very pleasant & willing to participate.  Progressing with mobility but cont's to require assistance for mobility & cues to reinforce back precautions throughout session.    Follow Up Recommendations  CIR     Equipment Recommendations  None recommended by PT    Recommendations for Other Services Rehab consult     Precautions / Restrictions Precautions Precautions: Back Precaution Comments: verbally reviewed with patient Required Braces or Orthoses: Spinal Brace Spinal Brace: Lumbar corset Restrictions Weight Bearing Restrictions: No    Mobility  Bed Mobility Overal bed mobility: Needs Assistance Bed Mobility: Rolling;Sidelying to Sit Rolling: Mod assist Sidelying to sit: Mod assist       General bed mobility comments: Max directional cues for sequencing & back precautions.    Transfers Overall transfer level: Needs assistance Equipment used: Rolling walker (2 wheeled) Transfers: Sit to/from Stand Sit to Stand: Min assist Stand pivot transfers: Min assist       General transfer comment: Cues for hand placement, back precautions, & to pivot hips completely around to align with seated surface.    Ambulation/Gait Ambulation/Gait assistance: Min assist Ambulation Distance (Feet): 40 Feet Assistive device: Rolling walker (2 wheeled) Gait Pattern/deviations: Step-to pattern;Decreased stride length;Narrow base of support  (shoulders/trunk leaning to Rt)     General Gait Details: Pt with flexed posture & shoulders/trunk leaning to Rt.  Cues & min (A) to manage RW around obstacles.     Stairs            Wheelchair Mobility    Modified Rankin (Stroke Patients Only)       Balance                                    Cognition Arousal/Alertness: Awake/alert Behavior During Therapy: WFL for tasks assessed/performed Overall Cognitive Status: Within Functional Limits for tasks assessed                      Exercises      General Comments        Pertinent Vitals/Pain "it's not bad" when asked about pain.      Home Living                      Prior Function            PT Goals (current goals can now be found in the care plan section) Acute Rehab PT Goals Patient Stated Goal: to get better PT Goal Formulation: With patient/family Time For Goal Achievement: 10/27/13 Potential to Achieve Goals: Good Progress towards PT goals: Progressing toward goals    Frequency  Min 5X/week    PT Plan Current plan remains appropriate    Co-evaluation             End of Session Equipment Utilized During Treatment: Back brace Activity Tolerance: Patient tolerated treatment well Patient left:  in chair;with call bell/phone within reach;with family/visitor present     Time: [id number] PT Time Calculation (min): 24 min  Charges:  $Gait Training: 8-22 mins $Therapeutic Activity: 8-22 mins                    G Codes:      Lara MulchKelly Lynn Chelsae Zanella 10/14/2013, 11:31 AM   Verdell FaceKelly Izza Bickle, PTA [phone] 10/14/2013

## 2013-10-15 NOTE — Progress Notes (Signed)
Patient ID: Mark Joseph, male   DOB: July 12, 1921, 80 y.o.   MRN: [id number] Overall appears to be doing quite well and seems pleased. He has been somewhat slow to mobilize which would be expected. Has some incisional soreness. No leg pain. Feels like his legs are somewhat deconditioned and generally weak but no focal weakness. Moves legs well to in bed exam. He is happy and smiling and seems pleased. Continue to mobilize with therapy.

## 2013-10-15 NOTE — Progress Notes (Signed)
Documentation on pt's foley catheter is misleading. Not sure when the catheter was placed, and not positive on reason for catheter to still be in place. Charge RN Arline Asp, contacting RN's with documentation error. Will continue to monitor.

## 2013-10-15 NOTE — Progress Notes (Signed)
Physical Therapy Treatment Patient Details Name: Mark Joseph MRN: [id number] DOB: 03/26/1922 Today's Date: 10/15/2013    History of Present Illness 80 yo who fell in March of 2015. Xray identified L2 compression fracture, no retropulsion. He was admitted for pain, and new onset atrial fibrillation. He had a prolonged stay in the hospital and rehab. Since discharge he has had increasing difficulty with back pain, walking, and urinary incontinence. Patient is now s/p LUMBAR TWO CORPECTOMY,LUMBAR ONE-LUMBAR THREE ARTHRODESIS.     PT Comments    Pt progressing well with therapy. Very motivated to progress mobility and return to independence. Cont to recommend CIR.   Follow Up Recommendations  CIR     Equipment Recommendations  None recommended by PT    Recommendations for Other Services Rehab consult     Precautions / Restrictions Precautions Precautions: Back;Fall Precaution Comments: pt able to recall 3/3 back precautions  Required Braces or Orthoses: Spinal Brace Spinal Brace: Lumbar corset Restrictions Weight Bearing Restrictions: No    Mobility  Bed Mobility Overal bed mobility: Needs Assistance Bed Mobility: Rolling;Sidelying to Sit Rolling: Supervision Sidelying to sit: Min assist       General bed mobility comments: (A) to elevate trunk to sitting position; incr time due to pain; cues for log rolling technique   Transfers Overall transfer level: Needs assistance Equipment used: Rolling walker (2 wheeled) Transfers: Sit to/from Stand Sit to Stand: Min assist         General transfer comment: cues for hand placement and sequencing; min (A) to maitnain balance; pt with fwd flexed trunk   Ambulation/Gait Ambulation/Gait assistance: Min assist Ambulation Distance (Feet): 80 Feet Assistive device: Rolling walker (2 wheeled) Gait Pattern/deviations: Step-through pattern;Decreased stride length;Trunk flexed Gait velocity: very decreased due to pain  Gait  velocity interpretation: Below normal speed for age/gender General Gait Details: flexed posture throughout; cues for upright posture; (A) to maintain balance and manage RW    Stairs            Wheelchair Mobility    Modified Rankin (Stroke Patients Only)       Balance Overall balance assessment: Needs assistance Sitting-balance support: Feet supported;No upper extremity supported Sitting balance-Leahy Scale: Good     Standing balance support: During functional activity;Bilateral upper extremity supported Standing balance-Leahy Scale: Poor Standing balance comment: flexed trunk and bil UEs supported by RW                    Cognition Arousal/Alertness: Awake/alert Behavior During Therapy: WFL for tasks assessed/performed Overall Cognitive Status: Within Functional Limits for tasks assessed                      Exercises      General Comments        Pertinent Vitals/Pain 4/10; RN made aware     Home Living                      Prior Function            PT Goals (current goals can now be found in the care plan section) Acute Rehab PT Goals Patient Stated Goal: to get better PT Goal Formulation: With patient/family Time For Goal Achievement: 10/27/13 Potential to Achieve Goals: Good Progress towards PT goals: Progressing toward goals    Frequency  Min 5X/week    PT Plan Current plan remains appropriate    Co-evaluation  End of Session Equipment Utilized During Treatment: Back brace Activity Tolerance: Patient tolerated treatment well Patient left: in chair;with call bell/phone within reach;with family/visitor present     Time: 4401-0272 PT Time Calculation (min): 12 min  Charges:  $Gait Training: 8-22 mins                    G CodesNadara Mustard [place], [place]  [phone] 10/15/2013, 12:23 PM

## 2013-10-16 LAB — TYPE AND SCREEN
ABO/RH(D): A POS
Antibody Screen: NEGATIVE
Unit division: 0
Unit division: 0

## 2013-10-16 MED ORDER — MAGNESIUM CITRATE PO SOLN
1.0000 | Freq: Once | ORAL | Status: AC
  Administered 2013-10-16: 1 via ORAL
  Filled 2013-10-16 (×3): qty 296

## 2013-10-16 NOTE — Progress Notes (Signed)
Patient ID: Mark Joseph, male   DOB: 1922-04-17, 80 y.o.   MRN: [id number] BP 135/85  Pulse 87  Temp(Src) 97.4 F (36.3 C) (Oral)  Resp 18  Ht 5\' 9"  (1.753 m)  Wt 67.586 kg (149 lb)  BMI 21.99 kg/m2  SpO2 94% Alert and oriented x 4 Moving lower extremities well Stable and available for transfer to rehab when bed available.  Wound is clean, dry, ecchymotic

## 2013-10-16 NOTE — PMR Pre-admission (Signed)
PMR Admission Coordinator Pre-Admission Assessment  Patient: Mark Joseph is an 80 y.o., male MRN: [id number] DOB: February 08, 1922 Height: 5\' 9"  (175.3 cm) Weight: 67.586 kg (149 lb)              Insurance Information HMO: yes    PPO:      PCP:      IPA:      80/20:      OTHER: medicare advantage plan PRIMARY: Blue Medicare      Policy#: [id number]      Subscriber: pt CM Name: Cedric      Phone#: [phone]     Fax#: [phone] Pre-Cert#: [id number]      Employer: retired/[employer] Benefits:  Phone #: [phone]     Name: 10/16/13 Eff. Date: 05/11/13     Deduct: none      Out of Pocket Max: $4500      Life Max: none CIR: 100 % coverage      SNF: 100%coverage for 100 days Outpatient: 100%     Co-Pay: no visit limit Home Health: 100%      Co-Pay: no visit limit DME: 80%     Co-Pay: 20% Providers: in network   SECONDARY: none      Medicaid Application Date:       Case Manager:  Disability Application Date:       Case Worker:   Emergency Conservator, museum/gallery Information   Name Relation Home Work Mobile   Henigan,Beverly Spouse [phone]  [phone]     Current Medical History  Patient Admitting Diagnosis: L2 compression fx s/p fusion  History of Present Illness: Mark Joseph is a 80 y.o. right-handed male with history of CVA September 2013. Patient well-known rehabilitation services 07/27/2013 secondary to deconditioning related to A. fib with RVR as well as recent L2 compression fracture after a fall with conservative care. He was discharged home from rehabilitation services.   Presented 10/06/2013 with increasing back pain and urinary incontinence. MRI of lumbar spine showed severe compression and retropulsion of known L2 compression fracture. Underwent lumbar 2 polypectomy, lumbar L1-3 arthrodesis 10/12/2013 per Dr. Mikal Plane. Postoperative pain management. Lumbar corset when out of bed. Patient on Eliquis for history of atrial fibrillation RVR prior to admission await plan to  resume.   Past Medical History  Past Medical History  Diagnosis Date  . Hypertension   . GERD (gastroesophageal reflux disease)   . Osteoporosis   . High cholesterol   . Stroke 01/2012    "small; affected his speech; pretty much back to normal now" (09/15/2012)  . Pneumonitis 1971  . Arthritis     "knees; right is worse" (09/15/2012)    Family History  family history is not on file.  Prior Rehab/Hospitalizations: CIR 07/27/13- 08/09/13 and d/c home at supervision level  Current Medications  Current facility-administered medications:0.9 % NaCl with KCl 20 mEq/ L  infusion, , Intravenous, Continuous, Carmela Hurt, MD, 75 mL/hr at 10/12/13 2236;  acetaminophen (TYLENOL) suppository 650 mg, 650 mg, Rectal, Q6H PRN, Carmela Hurt, MD;  acetaminophen (TYLENOL) tablet 650 mg, 650 mg, Oral, Q6H PRN, Carmela Hurt, MD alum & mag hydroxide-simeth (MAALOX/MYLANTA) 200-200-20 MG/5ML suspension 30 mL, 30 mL, Oral, Q6H PRN, Carmela Hurt, MD, 30 mL at 10/16/13 1002;  amiodarone (PACERONE) tablet 100 mg, 100 mg, Oral, Once per day on Sun Sat, Kyle L Cabbell, MD, 100 mg at 10/15/13 1002;  amiodarone (PACERONE) tablet 200 mg, 200 mg, Oral, Once per day on Mon Tue  Wed Thu Fri, Carmela Hurt, MD, 200 mg at 10/17/13 6659 bisacodyl (DULCOLAX) EC tablet 5 mg, 5 mg, Oral, Daily PRN, Carmela Hurt, MD, 5 mg at 10/16/13 0356;  calcium carbonate (OS-CAL - dosed in mg of elemental calcium) tablet 1,250 mg, 1,250 mg, Oral, BID, Carmela Hurt, MD, 1,250 mg at 10/17/13 9357;  diazepam (VALIUM) tablet 5 mg, 5 mg, Oral, Q6H PRN, Carmela Hurt, MD;  docusate sodium (COLACE) capsule 100 mg, 100 mg, Oral, BID, Carmela Hurt, MD, 100 mg at 10/16/13 1002 feeding supplement (ENSURE COMPLETE) (ENSURE COMPLETE) liquid 237 mL, 237 mL, Oral, BID BM, Haynes Bast, RD, 237 mL at 10/17/13 1132;  finasteride (PROSCAR) tablet 5 mg, 5 mg, Oral, Daily, Carmela Hurt, MD, 5 mg at 10/17/13 0177;  hydrochlorothiazide (MICROZIDE)  capsule 12.5 mg, 12.5 mg, Oral, Daily, Judie Bonus Hammons, RPH, 12.5 mg at 10/17/13 9390 HYDROcodone-acetaminophen (NORCO/VICODIN) 5-325 MG per tablet 1-2 tablet, 1-2 tablet, Oral, Q4H PRN, Carmela Hurt, MD, 1 tablet at 10/16/13 0356;  lisinopril (PRINIVIL,ZESTRIL) tablet 10 mg, 10 mg, Oral, Daily, Judie Bonus Hammons, RPH, 10 mg at 10/17/13 3009;  magic mouthwash, 15 mL, Oral, TID PRN, Barnett Abu, MD;  menthol-cetylpyridinium (CEPACOL) lozenge 3 mg, 1 lozenge, Oral, PRN, Carmela Hurt, MD metoprolol tartrate (LOPRESSOR) tablet 12.5 mg, 12.5 mg, Oral, BID, Carmela Hurt, MD, 12.5 mg at 10/17/13 2330;  morphine 2 MG/ML injection 1-4 mg, 1-4 mg, Intravenous, Q3H PRN, Carmela Hurt, MD;  multivitamin with minerals tablet 1 tablet, 1 tablet, Oral, Daily, Haynes Bast, RD;  ondansetron (ZOFRAN) injection 4 mg, 4 mg, Intravenous, Q4H PRN, Carmela Hurt, MD pantoprazole (PROTONIX) EC tablet 40 mg, 40 mg, Oral, Daily, Carmela Hurt, MD, 40 mg at 10/17/13 0937;  phenol (CHLORASEPTIC) mouth spray 1 spray, 1 spray, Mouth/Throat, PRN, Carmela Hurt, MD;  polyethylene glycol (MIRALAX / GLYCOLAX) packet 17 g, 17 g, Oral, Daily PRN, Carmela Hurt, MD, 17 g at 10/15/13 1803;  simvastatin (ZOCOR) tablet 10 mg, 10 mg, Oral, q1800, Carmela Hurt, MD, 10 mg at 10/15/13 1706 sodium chloride 0.9 % injection 3 mL, 3 mL, Intravenous, Q12H, Carmela Hurt, MD, 3 mL at 10/16/13 2216;  sodium chloride 0.9 % injection 3 mL, 3 mL, Intravenous, PRN, Carmela Hurt, MD;  tamsulosin (FLOMAX) capsule 0.4 mg, 0.4 mg, Oral, QPC breakfast, Carmela Hurt, MD, 0.4 mg at 10/17/13 0762  Patients Current Diet: General Pt with poor appetite this admission. Wife plans to bring food from the outside  Precautions / Restrictions Precautions Precautions: Back;Fall Precaution Comments: Pt able to describe precautions; educated. Spinal Brace: Lumbar corset;Applied in sitting position Restrictions Weight Bearing  Restrictions: No   Prior Activity Level   Journalist, newspaper / Equipment Home Assistive Devices/Equipment: Eyeglasses;Walker (specify type) Home Equipment: Bedside commode;Cane - quad;Cane - single point;Walker - 2 wheels  Prior Functional Level Prior Function Level of Independence: Independent Comments: Prior to initial fall, pt was independent, driving, going to the [facility] daily. since fall, pt has been having more difficulty with basic self care. (since fall was at rehab and then recieving HHPT)  Current Functional Level Cognition  Overall Cognitive Status: Within Functional Limits for tasks assessed Orientation Level: Oriented X4    Extremity Assessment (includes Sensation/Coordination)          ADLs  Overall ADL's : Needs assistance/impaired Eating/Feeding: Modified independent Grooming: Wash/dry face;Oral care;Sitting;Standing;Minimal assistance Upper Body Bathing: Minimal assitance;Sitting Lower Body Bathing: Maximal  assistance Upper Body Dressing : Set up;Supervision/safety;Sitting (brace) Lower Body Dressing: Minimal assistance;Sitting/lateral leans (socks) Toilet Transfer: Minimal assistance;Ambulation;BSC;RW Toileting- Clothing Manipulation and Hygiene: Maximal assistance Functional mobility during ADLs: Minimal assistance;Rolling walker General ADL Comments: Pt having difficulty standing upright at sink-OT giving cues to try to get pt to stand more upright-few resting breaks taken and pt performing part of grooming in sitting position. Educated on use of cups for teeth care and placement of grooming items. Educated to have clothing under back brace and to not sleep in it. Practiced with AE for LB ADLs and educated. Pt able to ambulate some in hallway.     Mobility  Overal bed mobility: Needs Assistance Bed Mobility: Rolling;Sidelying to Sit Rolling: Min assist Sidelying to sit: Mod assist Sit to sidelying: Max assist General bed mobility comments: Assistance  with rolling as well as with trunk to come to sitting position.    Transfers  Overall transfer level: Needs assistance Equipment used: Rolling walker (2 wheeled) Transfers: Sit to/from Stand Sit to Stand: Min assist;Mod assist Stand pivot transfers: Min assist General transfer comment: Mod A to stand from bed. Min A from [facility] and chair. Cues for technique.    Ambulation / Gait / Stairs / Wheelchair Mobility  Ambulation/Gait Ambulation/Gait assistance: Architect (Feet): 100 Feet Assistive device: Rolling walker (2 wheeled) Gait Pattern/deviations: Step-through pattern;Shuffle;Trunk flexed Gait velocity: decreased Gait velocity interpretation: Below normal speed for age/gender General Gait Details: Pt with significant trunk flexion during gait and when asked to stand as tall as he can, no change in his trunk height.  Shuffing pattern with cues to stay inside of RW especially while turning or navigating tight spaces in the room.     Posture / Balance Dynamic Sitting Balance Sitting balance - Comments: falling to R occasionally    Special needs/care consideration Skin intact  Bowel Management laxatives given 10/16/13 with good results Bladder mgmt: foley   Previous Home Environment Living Arrangements: Spouse/significant other  Lives With: Spouse Available Help at Discharge: Family;Available 24 hours/day Type of Home: House Home Layout: Two level;Able to live on main level with bedroom/bathroom Home Access: Stairs to enter Entrance Stairs-Rails: None Entrance Stairs-Number of Steps: 2 Bathroom Shower/Tub: Health visitor: Standard Bathroom Accessibility: Yes How Accessible: Accessible via walker Home Care Services: Yes (has had [facility] after last rehab stay 07/2013) Home Care Agency (if known): [facility] in the past  Discharge Living Setting Plans for Discharge Living Setting: Patient's home;Lives with (comment) (spouse) Type of Home at  Discharge: House Discharge Home Layout: Two level;Able to live on main level with bedroom/bathroom Discharge Home Access: Stairs to enter Entrance Stairs-Rails: None Entrance Stairs-Number of Steps: 2 Discharge Bathroom Shower/Tub: Walk-in shower Discharge Bathroom Toilet: Standard Discharge Bathroom Accessibility: Yes How Accessible: Accessible via walker Does the patient have any problems obtaining your medications?: No  Social/Family/Support Systems Patient Roles: Spouse Contact Information: [name], wife Anticipated Caregiver: wife Anticipated Caregiver's Contact Information: see above Ability/Limitations of Caregiver: supervision level Caregiver Availability: 24/7 Discharge Plan Discussed with Primary Caregiver: Yes Is Caregiver In Agreement with Plan?: Yes Does Caregiver/Family have Issues with Lodging/Transportation while Pt is in Rehab?: No  Goals/Additional Needs Patient/Family Goal for Rehab: supervision with PT and OT Expected length of stay: ELOS 10 to 15 days Dietary Needs: poor appetite Special Service Needs: bed and chair alarm Pt/Family Agrees to Admission and willing to participate: Yes Program Orientation Provided & Reviewed with Pt/Caregiver Including Roles  & Responsibilities: Yes  Decrease burden of Care through IP rehab admission: n/a  Possible need for SNF placement upon discharge:not expected  Patient Condition: This patient's medical and functional status has changed since the consult dated:10/13/2013 in which the Rehabilitation Physician determined and documented that the patient's condition is appropriate for intensive rehabilitative care in an inpatient rehabilitation facility. See "History of Present Illness" (above) for medical update. Functional changes are: min assist. Patient's medical and functional status update has been discussed with the Rehabilitation physician and patient remains appropriate for inpatient rehabilitation. Will admit to  inpatient rehab today.  Preadmission Screen Completed By:  Clois DupesBarbara Godwin Maliha Outten, 10/17/2013 11:45 AM ______________________________________________________________________   Discussed status with Dr. Riley KillSwartz on 10/17/13 at  1145 and received telephone approval for admission today.  Admission Coordinator:  Clois DupesBarbara Godwin Leyana Whidden, time [id number] Date 10/17/13

## 2013-10-16 NOTE — Progress Notes (Signed)
Pt foley remains intact with leg secure. Foley unclamped; foley care completed and s/s drainage or discharge noted. Will continue to monitor pt quietly. Arabella Merles Jameica Couts RN.

## 2013-10-16 NOTE — Progress Notes (Signed)
I met with pt and wife at bedside. Both are in agreement to an inpt rehab admission.I have begun [facility] approval and await their decision for possible admit today or tomorrow pending authorization. [phone]

## 2013-10-16 NOTE — Progress Notes (Signed)
Physical Therapy Treatment Patient Details Name: Mark Joseph MRN: [id number] DOB: 08/03/1921 Today's Date: 10/16/2013    History of Present Illness 80 yo who fell in March of 2015. Xray identified L2 compression fracture, no retropulsion. He was admitted for pain, and new onset atrial fibrillation. He had a prolonged stay in the hospital and rehab. Since discharge he has had increasing difficulty with back pain, walking, and urinary incontinence. Patient is now s/p LUMBAR TWO CORPECTOMY,LUMBAR ONE-LUMBAR THREE ARTHRODESIS.     PT Comments    Pt is progressing well with his mobility. He was able to walk further today with RW.  He still needs quite a bit of support off of lower surfaces like the commode. He has difficulty applying his back precautions to his functional mobility and continues to need cues to maintain precautions.  Pt continues to be appropriate for CIR level therapies at discharge.    Follow Up Recommendations  CIR     Equipment Recommendations  None recommended by PT    Recommendations for Other Services Rehab consult     Precautions / Restrictions Precautions Precautions: Back;Fall Precaution Comments: Pt did not functionally demonstrate back precautions.  verbal cues for log roll for OOB.  Wife did remember that he needed to have his brace on when he got up.  Required Braces or Orthoses: Spinal Brace Spinal Brace: Lumbar corset    Mobility  Bed Mobility Overal bed mobility: Needs Assistance Bed Mobility: Rolling;Sidelying to Sit Rolling: Min assist Sidelying to sit: Min assist       General bed mobility comments: Min assist to support trunk to get to sidelying and from side lying to sitting EOB.  Verbal cues to remind pt to use log roll to protect back.  Heavy reliance on arms for support.   Transfers Overall transfer level: Needs assistance Equipment used: Rolling walker (2 wheeled) Transfers: Sit to/from Stand Sit to Stand: Min assist;Mod assist          General transfer comment: Min assist to support trunk from higher bed to get to standing with RW.  Verbal cues for safe hand placement. Mod assist to stand from lower toilet to get to standing.    Ambulation/Gait Ambulation/Gait assistance: Min assist Ambulation Distance (Feet): 100 Feet Assistive device: Rolling walker (2 wheeled) Gait Pattern/deviations: Step-through pattern;Shuffle;Trunk flexed Gait velocity: decreased Gait velocity interpretation: Below normal speed for age/gender General Gait Details: Pt with significant trunk flexion during gait and when asked to stand as tall as he can, no change in his trunk height.  Shuffing pattern with cues to stay inside of RW especially while turning or navigating tight spaces in the room.             Balance Overall balance assessment: Needs assistance Sitting-balance support: Feet supported Sitting balance-Leahy Scale: Good     Standing balance support: Bilateral upper extremity supported Standing balance-Leahy Scale: Poor                      Cognition Arousal/Alertness: Awake/alert Behavior During Therapy: WFL for tasks assessed/performed Overall Cognitive Status: Within Functional Limits for tasks assessed                             Pertinent Vitals/Pain See vitals flow sheet.            PT Goals (current goals can now be found in the care plan section) Acute Rehab PT Goals Patient  Stated Goal: to get better Progress towards PT goals: Progressing toward goals    Frequency  Min 5X/week    PT Plan Current plan remains appropriate       End of Session Equipment Utilized During Treatment: Back brace Activity Tolerance: Patient tolerated treatment well Patient left: in chair;with call bell/phone within reach;with chair alarm set;with family/visitor present     Time: 2233-6122 PT Time Calculation (min): 17 min  Charges:  $Gait Training: 8-22 mins                      Demesha Boorman B.  Izumi Mixon, PT, DPT [phone]   10/16/2013, 5:39 PM

## 2013-10-17 ENCOUNTER — Inpatient Hospital Stay (HOSPITAL_COMMUNITY)
Admission: RE | Admit: 2013-10-17 | Discharge: 2013-11-03 | DRG: 946 | Disposition: A | Discharge status: Skilled Nursing Facility | Payer: Medicare Other | Source: Intra-hospital | Attending: Physical Medicine & Rehabilitation | Admitting: Physical Medicine & Rehabilitation

## 2013-10-17 DIAGNOSIS — Z7901 Long term (current) use of anticoagulants: Secondary | ICD-10-CM | POA: Diagnosis not present

## 2013-10-17 DIAGNOSIS — Z8673 Personal history of transient ischemic attack (TIA), and cerebral infarction without residual deficits: Secondary | ICD-10-CM | POA: Diagnosis not present

## 2013-10-17 DIAGNOSIS — S32009A Unspecified fracture of unspecified lumbar vertebra, initial encounter for closed fracture: Secondary | ICD-10-CM

## 2013-10-17 DIAGNOSIS — M810 Age-related osteoporosis without current pathological fracture: Secondary | ICD-10-CM | POA: Diagnosis not present

## 2013-10-17 DIAGNOSIS — N138 Other obstructive and reflux uropathy: Secondary | ICD-10-CM | POA: Diagnosis not present

## 2013-10-17 DIAGNOSIS — E7800 Pure hypercholesterolemia, unspecified: Secondary | ICD-10-CM | POA: Diagnosis not present

## 2013-10-17 DIAGNOSIS — K219 Gastro-esophageal reflux disease without esophagitis: Secondary | ICD-10-CM | POA: Diagnosis not present

## 2013-10-17 DIAGNOSIS — R32 Unspecified urinary incontinence: Secondary | ICD-10-CM | POA: Diagnosis not present

## 2013-10-17 DIAGNOSIS — Z5189 Encounter for other specified aftercare: Secondary | ICD-10-CM | POA: Diagnosis present

## 2013-10-17 DIAGNOSIS — E785 Hyperlipidemia, unspecified: Secondary | ICD-10-CM

## 2013-10-17 DIAGNOSIS — Z981 Arthrodesis status: Secondary | ICD-10-CM | POA: Diagnosis not present

## 2013-10-17 DIAGNOSIS — X58XXXA Exposure to other specified factors, initial encounter: Secondary | ICD-10-CM

## 2013-10-17 DIAGNOSIS — Z87891 Personal history of nicotine dependence: Secondary | ICD-10-CM

## 2013-10-17 DIAGNOSIS — N401 Enlarged prostate with lower urinary tract symptoms: Secondary | ICD-10-CM

## 2013-10-17 DIAGNOSIS — K5900 Constipation, unspecified: Secondary | ICD-10-CM

## 2013-10-17 DIAGNOSIS — Z79899 Other long term (current) drug therapy: Secondary | ICD-10-CM

## 2013-10-17 DIAGNOSIS — M542 Cervicalgia: Secondary | ICD-10-CM | POA: Diagnosis not present

## 2013-10-17 DIAGNOSIS — I10 Essential (primary) hypertension: Secondary | ICD-10-CM

## 2013-10-17 DIAGNOSIS — I4891 Unspecified atrial fibrillation: Secondary | ICD-10-CM

## 2013-10-17 DIAGNOSIS — R5381 Other malaise: Secondary | ICD-10-CM

## 2013-10-17 MED ORDER — HYDROCHLOROTHIAZIDE 12.5 MG PO CAPS
12.5000 mg | ORAL_CAPSULE | Freq: Every day | ORAL | Status: DC
  Administered 2013-10-18 – 2013-11-03 (×17): 12.5 mg via ORAL
  Filled 2013-10-17 (×18): qty 1

## 2013-10-17 MED ORDER — ONDANSETRON HCL 4 MG/2ML IJ SOLN: 4.0000 mg | Freq: Four times a day (QID) | INTRAMUSCULAR | Status: DC | PRN

## 2013-10-17 MED ORDER — SIMVASTATIN 10 MG PO TABS
10.0000 mg | ORAL_TABLET | Freq: Every day | ORAL | Status: DC
  Administered 2013-10-17 – 2013-11-02 (×17): 10 mg via ORAL
  Filled 2013-10-17 (×19): qty 1

## 2013-10-17 MED ORDER — ACETAMINOPHEN 325 MG PO TABS
325.0000 mg | ORAL_TABLET | ORAL | Status: DC | PRN
  Administered 2013-10-17 – 2013-10-31 (×5): 650 mg via ORAL
  Filled 2013-10-17 (×6): qty 2

## 2013-10-17 MED ORDER — ADULT MULTIVITAMIN W/MINERALS CH
1.0000 | ORAL_TABLET | Freq: Every day | ORAL | Status: DC
  Administered 2013-10-17: 1 via ORAL
  Filled 2013-10-17: qty 1

## 2013-10-17 MED ORDER — FINASTERIDE 5 MG PO TABS
5.0000 mg | ORAL_TABLET | Freq: Every day | ORAL | Status: DC
  Administered 2013-10-18 – 2013-11-03 (×17): 5 mg via ORAL
  Filled 2013-10-17 (×18): qty 1

## 2013-10-17 MED ORDER — ONDANSETRON HCL 4 MG PO TABS: 4.0000 mg | ORAL_TABLET | Freq: Four times a day (QID) | ORAL | Status: DC | PRN

## 2013-10-17 MED ORDER — HYDROCODONE-ACETAMINOPHEN 5-325 MG PO TABS
1.0000 | ORAL_TABLET | ORAL | Status: DC | PRN
  Administered 2013-10-18: 1 via ORAL
  Administered 2013-10-19: 2 via ORAL
  Administered 2013-10-19 – 2013-10-20 (×3): 1 via ORAL
  Administered 2013-10-21 – 2013-10-22 (×4): 2 via ORAL
  Administered 2013-10-23: 1 via ORAL
  Filled 2013-10-17: qty 1
  Filled 2013-10-17 (×4): qty 2
  Filled 2013-10-17: qty 1
  Filled 2013-10-17 (×3): qty 2
  Filled 2013-10-17: qty 1

## 2013-10-17 MED ORDER — LISINOPRIL 10 MG PO TABS
10.0000 mg | ORAL_TABLET | Freq: Every day | ORAL | Status: DC
  Administered 2013-10-18 – 2013-11-03 (×17): 10 mg via ORAL
  Filled 2013-10-17 (×18): qty 1

## 2013-10-17 MED ORDER — METOPROLOL TARTRATE 12.5 MG HALF TABLET
12.5000 mg | ORAL_TABLET | Freq: Two times a day (BID) | ORAL | Status: DC
  Administered 2013-10-17 – 2013-11-03 (×34): 12.5 mg via ORAL
  Filled 2013-10-17 (×36): qty 1

## 2013-10-17 MED ORDER — CALCIUM CARBONATE 1250 (500 CA) MG PO TABS
1250.0000 mg | ORAL_TABLET | Freq: Two times a day (BID) | ORAL | Status: DC
  Administered 2013-10-17 – 2013-11-03 (×34): 1250 mg via ORAL
  Filled 2013-10-17 (×36): qty 1

## 2013-10-17 MED ORDER — AMIODARONE HCL 100 MG PO TABS
100.0000 mg | ORAL_TABLET | ORAL | Status: DC
  Administered 2013-10-21 – 2013-10-29 (×4): 100 mg via ORAL
  Filled 2013-10-17 (×5): qty 1

## 2013-10-17 MED ORDER — METHOCARBAMOL 500 MG PO TABS
500.0000 mg | ORAL_TABLET | Freq: Four times a day (QID) | ORAL | Status: DC | PRN
  Administered 2013-10-22: 500 mg via ORAL
  Filled 2013-10-17: qty 1

## 2013-10-17 MED ORDER — AMIODARONE HCL 200 MG PO TABS
200.0000 mg | ORAL_TABLET | ORAL | Status: DC
  Administered 2013-10-18 – 2013-11-03 (×13): 200 mg via ORAL
  Filled 2013-10-17 (×13): qty 1

## 2013-10-17 MED ORDER — ENSURE COMPLETE PO LIQD
237.0000 mL | Freq: Two times a day (BID) | ORAL | Status: DC
  Administered 2013-10-17 (×2): 237 mL via ORAL

## 2013-10-17 MED ORDER — BISACODYL 5 MG PO TBEC
5.0000 mg | DELAYED_RELEASE_TABLET | Freq: Every day | ORAL | Status: DC | PRN
Start: 2013-10-17 — End: 2013-11-03

## 2013-10-17 MED ORDER — SORBITOL 70 % SOLN
30.0000 mL | Freq: Every day | Status: DC | PRN
  Administered 2013-10-21: 30 mL via ORAL
  Filled 2013-10-17: qty 30

## 2013-10-17 MED ORDER — TAMSULOSIN HCL 0.4 MG PO CAPS
0.4000 mg | ORAL_CAPSULE | Freq: Every day | ORAL | Status: DC
  Administered 2013-10-18 – 2013-11-03 (×17): 0.4 mg via ORAL
  Filled 2013-10-17 (×19): qty 1

## 2013-10-17 MED ORDER — DOCUSATE SODIUM 100 MG PO CAPS
100.0000 mg | ORAL_CAPSULE | Freq: Two times a day (BID) | ORAL | Status: DC
Start: 2013-10-17 — End: 2013-10-28
  Administered 2013-10-17 – 2013-10-27 (×18): 100 mg via ORAL
  Filled 2013-10-17 (×24): qty 1

## 2013-10-17 MED ORDER — POLYETHYLENE GLYCOL 3350 17 G PO PACK
17.0000 g | PACK | Freq: Every day | ORAL | Status: DC | PRN
  Administered 2013-10-19 – 2013-10-21 (×4): 17 g via ORAL
  Filled 2013-10-17 (×2): qty 1

## 2013-10-17 MED ORDER — PANTOPRAZOLE SODIUM 40 MG PO TBEC
40.0000 mg | DELAYED_RELEASE_TABLET | Freq: Every day | ORAL | Status: DC
  Administered 2013-10-18 – 2013-11-03 (×17): 40 mg via ORAL
  Filled 2013-10-17 (×18): qty 1

## 2013-10-17 MED FILL — Heparin Sodium (Porcine) Inj 1000 Unit/ML: INTRAMUSCULAR | Qty: 30 | Status: AC

## 2013-10-17 MED FILL — Sodium Chloride IV Soln 0.9%: INTRAVENOUS | Qty: 1000 | Status: AC

## 2013-10-17 NOTE — Progress Notes (Signed)
Physical Therapy Treatment Patient Details Name: Mark Joseph MRN: [id number] DOB: 12/07/1921 Today's Date: 10/17/2013    History of Present Illness 80 yo who fell in March of 2015. Xray identified L2 compression fracture, no retropulsion. He was admitted for pain, and new onset atrial fibrillation. He had a prolonged stay in the hospital and rehab. Since discharge he has had increasing difficulty with back pain, walking, and urinary incontinence. Patient is now s/p LUMBAR TWO CORPECTOMY,LUMBAR ONE-LUMBAR THREE ARTHRODESIS.     PT Comments    Pt is continuing to progress daily with gait distance, however, he fatigues quickly and has a difficult time with upright posture.  He is excited to hopefully go to inpatient rehab later today.  PT will continue to follow acutely.   Follow Up Recommendations  CIR     Equipment Recommendations  None recommended by PT    Recommendations for Other Services   NA     Precautions / Restrictions Precautions Precautions: Back;Fall Precaution Comments: Between pt and wife, they are able to report all 3 back precautions Required Braces or Orthoses: Spinal Brace Spinal Brace: Lumbar corset;Applied in sitting position    Mobility   Transfers Overall transfer level: Needs assistance Equipment used: Rolling walker (2 wheeled) Transfers: Sit to/from Stand Sit to Stand: Min assist         General transfer comment: Min assist to support trunk during transitions.  Verbal cues for hand placement and safety.   Ambulation/Gait Ambulation/Gait assistance: Min guard Ambulation Distance (Feet): 120 Feet Assistive device: Rolling walker (2 wheeled) Gait Pattern/deviations: Step-through pattern;Shuffle;Trunk flexed Gait velocity: decreased Gait velocity interpretation: Below normal speed for age/gender General Gait Details: Worked primarily on trying to maintain some form of upright posture during gait today.  Towards the end of the walk, when pt is  fatuged he has significant difficulty not progressing into more forward flexion.            Balance Overall balance assessment: Needs assistance Sitting-balance support: Feet supported Sitting balance-Leahy Scale: Fair Sitting balance - Comments: tends to list to the right even in the recliner chair. I emphasized the improtance of upright even sitting posture while he is healing.    Standing balance support: Bilateral upper extremity supported Standing balance-Leahy Scale: Poor                      Cognition Arousal/Alertness: Awake/alert Behavior During Therapy: WFL for tasks assessed/performed Overall Cognitive Status: Within Functional Limits for tasks assessed                      Exercises General Exercises - Upper Extremity Elbow Flexion: AROM;Both;10 reps;Seated General Exercises - Lower Extremity Long Arc Quad: AROM;Both;10 reps;Seated Hip Flexion/Marching: AROM;Both;10 reps;Seated Toe Raises: AROM;Both;10 reps;Seated Heel Raises: AROM;Both;10 reps;Seated        Pertinent Vitals/Pain See vitals flow sheet.            PT Goals (current goals can now be found in the care plan section) Acute Rehab PT Goals Patient Stated Goal: not stated Progress towards PT goals: Progressing toward goals    Frequency  Min 5X/week    PT Plan Current plan remains appropriate       End of Session Equipment Utilized During Treatment: Gait belt;Back brace Activity Tolerance: Patient limited by fatigue Patient left: in chair;with call bell/phone within reach;with family/visitor present     Time: [id number] PT Time Calculation (min): 20 min  Charges:  $Gait  Training: 8-22 mins                      Mark Joseph, PT, DPT [phone]   10/17/2013, 2:54 PM

## 2013-10-17 NOTE — Progress Notes (Signed)
Foley care completed, bag below level of the patient, leg strap secure and collection bag emptied. Will continue to monitor.

## 2013-10-17 NOTE — Discharge Summary (Signed)
Physician Discharge Summary  Patient ID: Mark Joseph MRN: [id number] DOB/AGE: 08-30-1921 80 y.o.  Admit date: 10/06/2013 Discharge date: 10/17/2013  Admission Diagnoses:Compression Fracture L2  Discharge Diagnoses:  Active Problems:   Compression fracture of L2 lumbar vertebra   Fracture of lumbar spine   Discharged Condition: good  Hospital Course: [name] was admitted urgently for a severe compression fracture of L2 causing significant spinal canal stenosis, with cauda equina compression. He was taken off Eliquis for 5 days, then he underwent an L2 corpectomy and decompression of the spinal canal. Post op he has done very well. He is ambulating, he remains with a foley catheter. He will be discharged to the rehabilitation service.   Consults: rehabilitation medicine  Significant Diagnostic Studies: none  Treatments: surgery: LUMBAR TWO CORPECTOMY,LUMBAR ONE-LUMBAR THREE ARTHRODESIS., With a titanium interbody implant (Globus) filled with morselized autograft, allograft via an anterolateral retroperitoneal approach Spinal canal decompression L2 Anterior instrumentation L1-L3, Globus plate and screws Partial resection 12th rib   Discharge Exam: Blood pressure 122/74, pulse 94, temperature 97.9 F (36.6 C), temperature source Oral, resp. rate 18, height 5\' 9"  (1.753 m), weight 67.586 kg (149 lb), SpO2 98.00%. General appearance: alert, cooperative, appears stated age and no distress Neurologic: Mental status: Alert, oriented, thought content appropriate Cranial nerves: normal Motor: weakness in hip flexors  Disposition: 01-Home or Self Care     Medication List    ASK your doctor about these medications       alendronate 70 MG tablet  Commonly known as:  FOSAMAX  Take 70 mg by mouth every Sunday. Take with a full glass of water on an empty stomach.     amiodarone 200 MG tablet  Commonly known as:  PACERONE  Take 100-200 mg by mouth daily. *takes 200mg  daily Monday  through Friday, but takes 100mg  daily Saturday and Sunday*     CALCIUM PO  Take 1 tablet by mouth 2 (two) times daily.     ciprofloxacin 500 MG tablet  Commonly known as:  CIPRO  Take 500 mg by mouth 2 (two) times daily.     ELIQUIS 5 MG Tabs tablet  Generic drug:  apixaban  Take 5 mg by mouth 2 (two) times daily.     finasteride 5 MG tablet  Commonly known as:  PROSCAR  Take 5 mg by mouth daily.     lisinopril-hydrochlorothiazide 10-12.5 MG per tablet  Commonly known as:  PRINZIDE,ZESTORETIC  Take 1 tablet by mouth daily.     metoprolol tartrate 25 MG tablet  Commonly known as:  LOPRESSOR  Take 12.5 mg by mouth 2 (two) times daily.     pantoprazole 40 MG tablet  Commonly known as:  PROTONIX  Take 40 mg by mouth daily.     polyethylene glycol packet  Commonly known as:  MIRALAX / GLYCOLAX  Take 17 g by mouth daily as needed for mild constipation.     pravastatin 20 MG tablet  Commonly known as:  PRAVACHOL  Take 20 mg by mouth every evening.     selenium 50 MCG Tabs tablet  Take 200 mcg by mouth daily.     tamsulosin 0.4 MG Caps capsule  Commonly known as:  FLOMAX  Take 0.4 mg by mouth daily after breakfast.         Signed: Carmela Hurt 10/17/2013, 11:42 AM

## 2013-10-17 NOTE — Progress Notes (Signed)
Pt transferred to Rehab from 4N. Alert and orientated x 4. Previous pt on rehab. Family looking forward to starting therapy in the morning.

## 2013-10-17 NOTE — Progress Notes (Signed)
PA with CIR contacted RN asking to contact Dr. Franky Macho and ask when it is ok for pt eliquis to be restarted. Dr. Joesphine Bare office contacted and message left with his secretary. Awaiting return call.

## 2013-10-17 NOTE — Progress Notes (Signed)
Occupational Therapy Treatment Patient Details Name: Mark Joseph MRN: [id number] DOB: Mar 13, 1922 Today's Date: 10/17/2013    History of present illness 80 yo who fell in March of 2015. Xray identified L2 compression fracture, no retropulsion. He was admitted for pain, and new onset atrial fibrillation. He had a prolonged stay in the hospital and rehab. Since discharge he has had increasing difficulty with back pain, walking, and urinary incontinence. Patient is now s/p LUMBAR TWO CORPECTOMY,LUMBAR ONE-LUMBAR THREE ARTHRODESIS.    OT comments  Pt progressing. Education provided and pt performed ADLs. Planning to d/c to rehab today.  Follow Up Recommendations  CIR;Supervision/Assistance - 24 hour    Equipment Recommendations  Other (comment) (defer to next venue)    Recommendations for Other Services Rehab consult    Precautions / Restrictions Precautions Precautions: Back;Fall Precaution Comments: Pt able to describe precautions; educated. Required Braces or Orthoses: Spinal Brace Spinal Brace: Lumbar corset;Applied in sitting position Restrictions Weight Bearing Restrictions: No       Mobility Bed Mobility Overal bed mobility: Needs Assistance Bed Mobility: Rolling;Sidelying to Sit Rolling: Min assist Sidelying to sit: Mod assist       General bed mobility comments: Assistance with rolling as well as with trunk to come to sitting position.  Transfers Overall transfer level: Needs assistance Equipment used: Rolling walker (2 wheeled) Transfers: Sit to/from Stand Sit to Stand: Min assist;Mod assist         General transfer comment: Mod A to stand from bed. Min A from [facility] and chair. Cues for technique.    Balance                                   ADL Overall ADL's : Needs assistance/impaired     Grooming: Wash/dry face;Oral care;Sitting;Standing;Minimal assistance           Upper Body Dressing : Set up;Supervision/safety;Sitting (brace)    Lower Body Dressing: Minimal assistance;Sitting/lateral leans (socks)   Toilet Transfer: Minimal assistance;Ambulation;BSC;RW           Functional mobility during ADLs: Minimal assistance;Rolling walker General ADL Comments: Pt having difficulty standing upright at sink-OT giving cues to try to get pt to stand more upright-few resting breaks taken and pt performing part of grooming in sitting position. Educated on use of cups for teeth care and placement of grooming items. Educated to have clothing under back brace and to not sleep in it. Practiced with AE for LB ADLs and educated. Pt able to ambulate some in hallway.       Vision                     Perception     Praxis      Cognition   Behavior During Therapy: [facility] for tasks assessed/performed Overall Cognitive Status: Within Functional Limits for tasks assessed                       Extremity/Trunk Assessment               Exercises     Shoulder Instructions       General Comments      Pertinent Vitals/ Pain       Pain 4/10 when moving. Repositioned.   Home Living  Prior Functioning/Environment              Frequency Min 2X/week     Progress Toward Goals  OT Goals(current goals can now be found in the care plan section)  Progress towards OT goals: Progressing toward goals-updated goal  Acute Rehab OT Goals Patient Stated Goal: not stated OT Goal Formulation: With patient Time For Goal Achievement: 10/27/13 Potential to Achieve Goals: Good ADL Goals Pt Will Perform Lower Body Bathing: with min assist;sit to/from stand;with adaptive equipment Pt Will Perform Lower Body Dressing: with min assist;with adaptive equipment;sit to/from stand Pt Will Transfer to Toilet: with min guard assist;ambulating;bedside commode Pt Will Perform Toileting - Clothing Manipulation and hygiene: with min assist;sit to/from stand Additional  ADL Goal #1: Pt will verbalize 3/3 back precautions  independently  Plan Discharge plan remains appropriate    Co-evaluation                 End of Session Equipment Utilized During Treatment: Gait belt;Rolling walker;Back brace   Activity Tolerance Patient tolerated treatment well   Patient Left in chair;with call bell/phone within reach;with chair alarm set   Nurse Communication          Time: [id number] OT Time Calculation (min): 32 min  Charges: OT General Charges $OT Visit: 1 Procedure OT Treatments $Self Care/Home Management : 23-37 mins  Earlie RavelingLindsey L Carley Strickling OTR/L [phone] 10/17/2013, 10:59 AM

## 2013-10-17 NOTE — Progress Notes (Signed)
UR complete.  Giavanna Kang RN, MSN 

## 2013-10-17 NOTE — H&P (Signed)
Physical Medicine and Rehabilitation Admission H&P  No chief complaint on file.  :  Chief complaint: Back pain  HPI: Mark Joseph is a 80 y.o. right-handed male with history of CVA September 2013. Patient well-known rehabilitation services 07/27/2013 secondary to deconditioning related to A. fib with RVR as well as recent L2 compression fracture after a fall with conservative care. He was discharged home from rehabilitation services ambulating 200 feet with her walker and minimal assistance. Presented 10/06/2013 with increasing back pain and urinary incontinence. MRI of lumbar spine showed severe compression and retropulsion of known L2 compression fracture. Underwent lumbar 2 polypectomy, lumbar L1-3 arthrodesis 10/12/2013 per Dr. Mikal Plane. Postoperative pain management. Lumbar corset when out of bed. Patient on Eliquis for history of atrial fibrillation RVR prior to admission await plan to resume. Physical and occupational therapy evaluations completed 10/13/2013 with recommendations of physical medicine rehabilitation consult. Patient was admitted for comprehensive rehabilitation program    ROS Review of Systems   Cardiovascular: Positive for palpitations.  Genitourinary: Positive for urgency and frequency.  Musculoskeletal: Positive for back pain and myalgias.  All other systems reviewed and are negative  Past Medical History   Diagnosis  Date   .  Hypertension    .  GERD (gastroesophageal reflux disease)    .  Osteoporosis    .  High cholesterol    .  Stroke  01/2012     "small; affected his speech; pretty much back to normal now" (09/15/2012)   .  Pneumonitis  1971   .  Arthritis      "knees; right is worse" (09/15/2012)    Past Surgical History   Procedure  Laterality  Date   .  Appendectomy   1953   .  Inguinal hernia repair  Bilateral  09/15/2012     w/mesh Hattie Perch 09/15/2012   .  Inguinal hernia repair  Bilateral  09/15/2012     Procedure: HERNIA REPAIR INGUINAL ADULT BILATERAL;  Surgeon: Emelia Loron, MD; Location: [facility] OR; Service: General; Laterality: Bilateral;   .  Insertion of mesh  Bilateral  09/15/2012     Procedure: INSERTION OF MESH; Surgeon: Emelia Loron, MD; Location: [facility] OR; Service: General; Laterality: Bilateral;    History reviewed. No pertinent family history.  Social History: reports that he quit smoking about 45 years ago. His smoking use included Cigarettes. He has a 12.5 pack-year smoking history. He has never used smokeless tobacco. He reports that he does not drink alcohol or use illicit drugs.  Allergies: No Known Allergies  Medications Prior to Admission   Medication  Sig  Dispense  Refill   .  alendronate (FOSAMAX) 70 MG tablet  Take 70 mg by mouth every Sunday. Take with a full glass of water on an empty stomach.     Marland Kitchen  amiodarone (PACERONE) 200 MG tablet  Take 100-200 mg by mouth daily. *takes 200mg  daily Monday through Friday, but takes 100mg  daily Saturday and Sunday*     .  apixaban (ELIQUIS) 5 MG TABS tablet  Take 5 mg by mouth 2 (two) times daily.     Marland Kitchen  CALCIUM PO  Take 1 tablet by mouth 2 (two) times daily.     .  ciprofloxacin (CIPRO) 500 MG tablet  Take 500 mg by mouth 2 (two) times daily.     .  finasteride (PROSCAR) 5 MG tablet  Take 5 mg by mouth daily.     Marland Kitchen  lisinopril-hydrochlorothiazide (PRINZIDE,ZESTORETIC) 10-12.5 MG per tablet  Take  1 tablet by mouth daily.     .  metoprolol tartrate (LOPRESSOR) 25 MG tablet  Take 12.5 mg by mouth 2 (two) times daily.     .  pantoprazole (PROTONIX) 40 MG tablet  Take 40 mg by mouth daily.     .  polyethylene glycol (MIRALAX / GLYCOLAX) packet  Take 17 g by mouth daily as needed for mild constipation.     .  pravastatin (PRAVACHOL) 20 MG tablet  Take 20 mg by mouth every evening.     .  selenium 50 MCG TABS tablet  Take 200 mcg by mouth daily.     .  tamsulosin (FLOMAX) 0.4 MG CAPS capsule  Take 0.4 mg by mouth daily after breakfast.      Home:  Home Living  Family/patient expects to  be discharged to:: Private residence  Living Arrangements: Spouse/significant other  Available Help at Discharge: Family;Available 24 hours/day  Type of Home: House  Home Access: Stairs to enter  [employer] of Steps: 2  Entrance Stairs-Rails: None  Home Layout: Two level;Able to live on main level with bedroom/bathroom  Home Equipment: Bedside commode;Cane - quad;Cane - single point;Walker - 2 wheels  Functional History:  Prior Function  Level of Independence: Independent  Comments: Prior to initial fall, pt was independent, driving, going to the [facility] daily. since fall, pt has been having more difficulty with basic self care. (since fall was at rehab and then recieving HHPT)  Functional Status:  Mobility:  Bed Mobility  Overal bed mobility: Needs Assistance  Bed Mobility: Rolling;Sit to Sidelying;Sit to Supine  Rolling: Mod assist  Sidelying to sit: Max assist  Sit to sidelying: Max assist  General bed mobility comments: c/o pain in sidelying. Max A to assist with trunk and BLE  Transfers  Overall transfer level: Needs assistance  Equipment used: Rolling walker (2 wheeled)  Transfers: Sit to/from [employer]  Sit to Stand: Mod assist  Stand pivot transfers: Mod assist  General transfer comment: c/o dizziness in standing.Poor upright posture  Ambulation/Gait  Ambulation/Gait assistance: Mod assist  Ambulation Distance (Feet): 6 Feet  Assistive device: Rolling walker (2 wheeled)  General Gait Details: pivotal steps to chair   ADL:  ADL  Overall ADL's : Needs assistance/impaired  Eating/Feeding: Modified independent  Grooming: Minimal assistance  Upper Body Bathing: Minimal assitance;Sitting  Lower Body Bathing: Maximal assistance  Upper Body Dressing : Minimal assistance  Lower Body Dressing: Maximal assistance  Toilet Transfer: Moderate assistance;Stand-pivot  Toileting- Clothing Manipulation and Hygiene: Maximal assistance  Functional mobility  during ADLs: Moderate assistance (stand pivot)  General ADL Comments: Began educating pt on backprecautions  Cognition:  Cognition  Overall Cognitive Status: Within Functional Limits for tasks assessed  Orientation Level: Oriented X4  Cognition  Arousal/Alertness: Awake/alert  Behavior During Therapy: WFL for tasks assessed/performed  Overall Cognitive Status: Within Functional Limits for tasks assessed    Physical Exam:  Blood pressure 125/71, pulse 93, temperature 97.3 F (36.3 C), temperature source Oral, resp. rate 24, height 5\' 9"  (1.753 m), weight 67.586 kg (149 lb), SpO2 100.00%.    Constitutional: He is oriented to person, place, and time. He appears well-developed.  HENT:  Head: Normocephalic.  Eyes: EOM are normal.  Neck: Normal range of motion. Neck supple. No thyromegaly present.  Cardiovascular:  Cardiac rate controlled. No murmurs, rubs, gallops Respiratory: Effort normal and breath sounds normal. No respiratory distress. No wheezes GI: Soft. Bowel sounds are normal. He exhibits no distension.  Neurological: He is alert and oriented to person, place, and time.  UE's grossly 4/5 proximal to distal. RLE: 2+ RHF, 2- RKE, 4/5 R ankles. LLE: 3+ HF, 3 KE and 4/5 ankles. No gross sensory deficits noted. Proximal movement limited by pain.   Skin:  Back incision with dressing. LSO fitting appropriately. Psychiatric: He has a normal mood and affect. His behavior is normal. Thought content normal    Results for orders placed during the hospital encounter of 10/06/13 (from the past 48 hour(s))   HEPARIN LEVEL (UNFRACTIONATED) Status: None    Collection Time    10/12/13 5:28 AM   Result  Value  Ref Range    Heparin Unfractionated  0.33  0.30 - 0.70 IU/mL    Comment:      IF HEPARIN RESULTS ARE BELOW     EXPECTED VALUES, AND PATIENT     DOSAGE HAS BEEN CONFIRMED,     SUGGEST FOLLOW UP TESTING     OF ANTITHROMBIN III LEVELS.   CBC Status: Abnormal    Collection Time     10/12/13 5:28 AM   Result  Value  Ref Range    WBC  6.2  4.0 - 10.5 K/uL    RBC  3.49 (*)  4.22 - 5.81 MIL/uL    Hemoglobin  11.5 (*)  13.0 - 17.0 g/dL    HCT  16.1 (*)  09.6 - 52.0 %    MCV  92.3  78.0 - 100.0 fL    MCH  33.0  26.0 - 34.0 pg    MCHC  35.7  30.0 - 36.0 g/dL    RDW  04.5  40.9 - 81.1 %    Platelets  209  150 - 400 K/uL   TYPE AND SCREEN Status: None    Collection Time    10/12/13 12:25 PM   Result  Value  Ref Range    ABO/RH(D)  A POS     Antibody Screen  NEG     Sample Expiration  10/15/2013     Unit Number  [id number]     Blood Component Type  RED CELLS,LR     Unit division  00     Status of Unit  ALLOCATED     Transfusion Status  OK TO TRANSFUSE     Crossmatch Result  Compatible     Unit Number  [id number]     Blood Component Type  RED CELLS,LR     Unit division  00     Status of Unit  ALLOCATED     Transfusion Status  OK TO TRANSFUSE     Crossmatch Result  Compatible    ABO/RH Status: None    Collection Time    10/12/13 12:25 PM   Result  Value  Ref Range    ABO/RH(D)  A POS    POCT I-STAT 7, (LYTES, BLD GAS, ICA,H+H) Status: Abnormal    Collection Time    10/12/13 6:40 PM   Result  Value  Ref Range    pH, Arterial  7.431  7.350 - 7.450    pCO2 arterial  45.8 (*)  35.0 - 45.0 mmHg    pO2, Arterial  302.0 (*)  80.0 - 100.0 mmHg    Bicarbonate  30.4 (*)  20.0 - 24.0 mEq/L    TCO2  32  0 - 100 mmol/L    O2 Saturation  100.0     Acid-Base Excess  5.0 (*)  0.0 - 2.0 mmol/L  Sodium  137  137 - 147 mEq/L    Potassium  3.6 (*)  3.7 - 5.3 mEq/L    Calcium, Ion  1.18  1.13 - 1.30 mmol/L    HCT  28.0 (*)  39.0 - 52.0 %    Hemoglobin  9.5 (*)  13.0 - 17.0 g/dL    Patient temperature  37.4 C     Sample type  ARTERIAL     Dg Lumbar Spine 2-3 Views  10/12/2013 CLINICAL DATA: L2 corpectomy EXAM: DG C-ARM GT 120 MIN; LUMBAR SPINE - 2-3 VIEW FLUOROSCOPY TIME: 2 minutes 11 seconds COMPARISON: Lumbar spine MRI - 10/05/2013 FINDINGS: Three spot  intraoperative radiographic images of the lumbar spine are provided for review. Initial image demonstrates a marking instrument overlying the previously noted severe (greater than 70%) compression deformity / vertebral plana of the L2 vertebral body. Subsequent images demonstrate at least partial resection of the L2 vertebral body with placement of a vertebral body spacer prosthesis and left-sided side plate fixation with transfixing cancellous screws seen at L1 and L3. No evidence of hardware failure or loosening. There is restoration of the L2 vertebral body space height. IMPRESSION: Post L2 vertebral body replacement and sideplate fixation without evidence of complication. Electronically Signed By: Simonne Come M.D. On: 10/12/2013 21:18  Dg C-arm Gt 120 Min  10/12/2013 CLINICAL DATA: L2 corpectomy EXAM: DG C-ARM GT 120 MIN; LUMBAR SPINE - 2-3 VIEW FLUOROSCOPY TIME: 2 minutes 11 seconds COMPARISON: Lumbar spine MRI - 10/05/2013 FINDINGS: Three spot intraoperative radiographic images of the lumbar spine are provided for review. Initial image demonstrates a marking instrument overlying the previously noted severe (greater than 70%) compression deformity / vertebral plana of the L2 vertebral body. Subsequent images demonstrate at least partial resection of the L2 vertebral body with placement of a vertebral body spacer prosthesis and left-sided side plate fixation with transfixing cancellous screws seen at L1 and L3. No evidence of hardware failure or loosening. There is restoration of the L2 vertebral body space height. IMPRESSION: Post L2 vertebral body replacement and sideplate fixation without evidence of complication. Electronically Signed By: Simonne Come M.D. On: 10/12/2013 21:18   Medical Problem List and Plan:  1. Functional deficits secondary to L2 compression fracture/radiculopathy status post fusion and arthrodesis. Lumbar corset when out of bed  2. DVT Prophylaxis/Anticoagulation: Discuss resuming  Eliquis  3. Pain Management: Hydrocodone and Robaxin as needed. Monitor with increased mobility  4. History CVA 2013. Discuss resuming Eliquis  5. Neuropsych: This patient is capable of making decisions on his own behalf.  6. Hypertension/atrial fibrillation. Amiodarone 100 mg once per day Saturday Sunday and 200 mg all other days, Lopressor 12.5 mg twice a day, hydrochlorothiazide 12.5 mg daily, lisinopril 10 mg daily. Cardiac rate controlled  7. BPH. Proscar 5 mg daily and Flomax 0.4 mg daily. Check PVRs x3 encourage patients to stand to void  8. Hyperlipidemia. Zocor  9. GERD. Protonix  Post Admission Physician Evaluation:  1. Functional deficits secondary to L2 compression fx/radiculopathy s/p fusion and decompression. 2. Patient is admitted to receive collaborative, interdisciplinary care between the physiatrist, rehab nursing staff, and therapy team. 3. Patient's level of medical complexity and substantial therapy needs in context of that medical necessity cannot be provided at a lesser intensity of care such as a SNF. 4. Patient has experienced substantial functional loss from his/her baseline which was documented above under the "Functional History" and "Functional Status" headings. Judging by the patient's diagnosis, physical exam, and functional history, the  patient has potential for functional progress which will result in measurable gains while on inpatient rehab. These gains will be of substantial and practical use upon discharge in facilitating mobility and self-care at the household level. 5. Physiatrist will provide 24 hour management of medical needs as well as oversight of the therapy plan/treatment and provide guidance as appropriate regarding the interaction of the two. 6. 24 hour rehab nursing will assist with bladder management, bowel management, safety, skin/wound care, disease management, medication administration, pain management and patient education and help integrate therapy  concepts, techniques,education, etc. 7. PT will assess and treat for/with: Lower extremity strength, range of motion, stamina, balance, functional mobility, safety, adaptive techniques and equipment, pain mgt, NMR, ?orthotics, family education, egosupport, brace donning/doffing. Goals are: mod I to supervision. 8. OT will assess and treat for/with: ADL's, functional mobility, safety, upper extremity strength, adaptive techniques and equipment, NMR, family education, brace donning/doffing. Goals are: mod I to supervision. 9. SLP will assess and treat for/with: n/a. Goals are: n/a. 10. Case Management and Social Worker will assess and treat for psychological issues and discharge planning. 11. Team conference will be held weekly to assess progress toward goals and to determine barriers to discharge. 12. Patient will receive at least 3 hours of therapy per day at least 5 days per week. 13. ELOS: 10-14 days  14. Prognosis: excellent  [name], MD, [facility] HospitalFAAPMR [facility]   10/13/2013

## 2013-10-17 NOTE — Progress Notes (Signed)
Pt A&O x4; pt discharge education and instructions completed. Pt foley remains intact and draining clear yellow urine; Pt discharge to CIR; both IV's removed, incision dermabond open to air, clean dry and intact; report called off to [employer] in [employer]. Pt transported off unit via wheelchair with spouse and belongings at side. Mark Joseph Mark Joseph Ke RN.

## 2013-10-17 NOTE — Progress Notes (Signed)
Physical Medicine and Rehabilitation Consult  Reason for Consult: Lumbar L2 fracture with spinal canal compression  Referring Physician: Dr. Mikal Plane  HPI: Mark Joseph is a 80 y.o. right-handed male with history of CVA September 2013. Patient well-known rehabilitation services 07/27/2013 secondary to deconditioning related to A. fib with RVR as well as recent L2 compression fracture after a fall with conservative care. He was discharged home from rehabilitation services ambulating 200 feet with her walker and minimal assistance. Presented 10/06/2013 with increasing back pain and urinary incontinence. MRI of lumbar spine showed severe compression and retropulsion of known L2 compression fracture. Underwent lumbar 2 polypectomy, lumbar L1-3 arthrodesis 10/12/2013 per Dr. Mikal Plane. Postoperative pain management. Lumbar corset when out of bed. Patient on Eliquis for history of atrial fibrillation RVR prior to admission await plan to resume. Physical therapy evaluation completed 10/13/2013 with recommendations of physical medicine rehabilitation consult.  Review of Systems  Cardiovascular: Positive for palpitations.  Genitourinary: Positive for urgency and frequency.  Musculoskeletal: Positive for back pain and myalgias.  All other systems reviewed and are negative.   Past Medical History   Diagnosis  Date   .  Hypertension    .  GERD (gastroesophageal reflux disease)    .  Osteoporosis    .  High cholesterol    .  Stroke  01/2012     "small; affected his speech; pretty much back to normal now" (09/15/2012)   .  Pneumonitis  1971   .  Arthritis      "knees; right is worse" (09/15/2012)    Past Surgical History   Procedure  Laterality  Date   .  Appendectomy   1953   .  Inguinal hernia repair  Bilateral  09/15/2012     w/mesh Hattie Perch 09/15/2012   .  Inguinal hernia repair  Bilateral  09/15/2012     Procedure: HERNIA REPAIR INGUINAL ADULT BILATERAL; Surgeon: Emelia Loron, MD; Location: [facility] OR; Service:  General; Laterality: Bilateral;   .  Insertion of mesh  Bilateral  09/15/2012     Procedure: INSERTION OF MESH; Surgeon: Emelia Loron, MD; Location: [facility] OR; Service: General; Laterality: Bilateral;    History reviewed. No pertinent family history.  Social History: reports that he quit smoking about 45 years ago. His smoking use included Cigarettes. He has a 12.5 pack-year smoking history. He has never used smokeless tobacco. He reports that he does not drink alcohol or use illicit drugs.  Allergies: No Known Allergies  Medications Prior to Admission   Medication  Sig  Dispense  Refill   .  alendronate (FOSAMAX) 70 MG tablet  Take 70 mg by mouth every Sunday. Take with a full glass of water on an empty stomach.     Marland Kitchen  amiodarone (PACERONE) 200 MG tablet  Take 100-200 mg by mouth daily. *takes 200mg  daily Monday through Friday, but takes 100mg  daily Saturday and Sunday*     .  apixaban (ELIQUIS) 5 MG TABS tablet  Take 5 mg by mouth 2 (two) times daily.     Marland Kitchen  CALCIUM PO  Take 1 tablet by mouth 2 (two) times daily.     .  ciprofloxacin (CIPRO) 500 MG tablet  Take 500 mg by mouth 2 (two) times daily.     .  finasteride (PROSCAR) 5 MG tablet  Take 5 mg by mouth daily.     Marland Kitchen  lisinopril-hydrochlorothiazide (PRINZIDE,ZESTORETIC) 10-12.5 MG per tablet  Take 1 tablet by mouth daily.     Marland Kitchen  metoprolol tartrate (LOPRESSOR) 25 MG tablet  Take 12.5 mg by mouth 2 (two) times daily.     .  pantoprazole (PROTONIX) 40 MG tablet  Take 40 mg by mouth daily.     .  polyethylene glycol (MIRALAX / GLYCOLAX) packet  Take 17 g by mouth daily as needed for mild constipation.     .  pravastatin (PRAVACHOL) 20 MG tablet  Take 20 mg by mouth every evening.     .  selenium 50 MCG TABS tablet  Take 200 mcg by mouth daily.     .  tamsulosin (FLOMAX) 0.4 MG CAPS capsule  Take 0.4 mg by mouth daily after breakfast.      Home:  Home Living  Family/patient expects to be discharged to:: Private residence  Living  Arrangements: Spouse/significant other  Available Help at Discharge: Family;Available 24 hours/day  Type of Home: House  Home Access: Stairs to enter  [employer] of Steps: 2  Entrance Stairs-Rails: None  Home Layout: Two level;Able to live on main level with bedroom/bathroom  Home Equipment: Bedside commode;Cane - quad;Cane - single point;Walker - 2 wheels  Functional History:  Prior Function  Level of Independence: Independent  Comments: Prior to initial fall, pt was independent, driving, going to the [facility] daily (since fall was at rehab and then recieving HHPT)  Functional Status:  Mobility:  Bed Mobility  Overal bed mobility: Needs Assistance  Bed Mobility: Rolling;Sidelying to Sit  Rolling: Mod assist  Sidelying to sit: Max assist  General bed mobility comments: Assist for rolling, patient with pain when rolling because of incision, hesitant to come to sidelying, assist to upright, max assist for trunk support and rotation to EOB.  Transfers  Overall transfer level: Needs assistance  Equipment used: Rolling walker (2 wheeled)  Transfers: Sit to/from [employer]  Sit to Stand: Mod assist  Stand pivot transfers: Mod assist  General transfer comment: assist for stability, patient with small shuffling steps narrow support  Ambulation/Gait  Ambulation/Gait assistance: Mod assist  Ambulation Distance (Feet): 6 Feet  Assistive device: Rolling walker (2 wheeled)  General Gait Details: pivotal steps to chair   ADL:   Cognition:  Cognition  Overall Cognitive Status: Within Functional Limits for tasks assessed  Orientation Level: Oriented X4  Cognition  Arousal/Alertness: Awake/alert  Behavior During Therapy: WFL for tasks assessed/performed  Overall Cognitive Status: Within Functional Limits for tasks assessed  Blood pressure 125/71, pulse 93, temperature 97.2 F (36.2 C), temperature source Oral, resp. rate 24, height 5\' 9"  (1.753 m), weight 67.586 kg  (149 lb), SpO2 100.00%.  Physical Exam  Vitals reviewed.  Constitutional: He is oriented to person, place, and time. He appears well-developed.  HENT:  Head: Normocephalic.  Eyes: EOM are normal.  Neck: Normal range of motion. Neck supple. No thyromegaly present.  Cardiovascular:  Cardiac rate controlled  Respiratory: Effort normal and breath sounds normal. No respiratory distress.  GI: Soft. Bowel sounds are normal. He exhibits no distension.  Neurological: He is alert and oriented to person, place, and time.  UE's grossly 4/5. LE's: 2+ RHF, 2- LF, 3- KE and 4/5 ankles. No gross sensory deficits. Proximal movement limited by pain. LSO at bedside  Skin:  Back incision with dressing  Psychiatric: He has a normal mood and affect. His behavior is normal. Thought content normal.   Results for orders placed during the hospital encounter of 10/06/13 (from the past 24 hour(s))   TYPE AND SCREEN Status: None  Collection Time    10/12/13 12:25 PM   Result  Value  Ref Range    ABO/RH(D)  A POS     Antibody Screen  NEG     Sample Expiration  10/15/2013     Unit Number  [id number]     Blood Component Type  RED CELLS,LR     Unit division  00     Status of Unit  ALLOCATED     Transfusion Status  OK TO TRANSFUSE     Crossmatch Result  Compatible     Unit Number  [id number]     Blood Component Type  RED CELLS,LR     Unit division  00     Status of Unit  ALLOCATED     Transfusion Status  OK TO TRANSFUSE     Crossmatch Result  Compatible    ABO/RH Status: None    Collection Time    10/12/13 12:25 PM   Result  Value  Ref Range    ABO/RH(D)  A POS    POCT I-STAT 7, (LYTES, BLD GAS, ICA,H+H) Status: Abnormal    Collection Time    10/12/13 6:40 PM   Result  Value  Ref Range    pH, Arterial  7.431  7.350 - 7.450    pCO2 arterial  45.8 (*)  35.0 - 45.0 mmHg    pO2, Arterial  302.0 (*)  80.0 - 100.0 mmHg    Bicarbonate  30.4 (*)  20.0 - 24.0 mEq/L    TCO2  32  0 - 100 mmol/L    O2  Saturation  100.0     Acid-Base Excess  5.0 (*)  0.0 - 2.0 mmol/L    Sodium  137  137 - 147 mEq/L    Potassium  3.6 (*)  3.7 - 5.3 mEq/L    Calcium, Ion  1.18  1.13 - 1.30 mmol/L    HCT  28.0 (*)  39.0 - 52.0 %    Hemoglobin  9.5 (*)  13.0 - 17.0 g/dL    Patient temperature  37.4 C     Sample type  ARTERIAL     Dg Lumbar Spine 2-3 Views  10/12/2013 CLINICAL DATA: L2 corpectomy EXAM: DG C-ARM GT 120 MIN; LUMBAR SPINE - 2-3 VIEW FLUOROSCOPY TIME: 2 minutes 11 seconds COMPARISON: Lumbar spine MRI - 10/05/2013 FINDINGS: Three spot intraoperative radiographic images of the lumbar spine are provided for review. Initial image demonstrates a marking instrument overlying the previously noted severe (greater than 70%) compression deformity / vertebral plana of the L2 vertebral body. Subsequent images demonstrate at least partial resection of the L2 vertebral body with placement of a vertebral body spacer prosthesis and left-sided side plate fixation with transfixing cancellous screws seen at L1 and L3. No evidence of hardware failure or loosening. There is restoration of the L2 vertebral body space height. IMPRESSION: Post L2 vertebral body replacement and sideplate fixation without evidence of complication. Electronically Signed By: Simonne Come M.D. On: 10/12/2013 21:18  Dg C-arm Gt 120 Min  10/12/2013 CLINICAL DATA: L2 corpectomy EXAM: DG C-ARM GT 120 MIN; LUMBAR SPINE - 2-3 VIEW FLUOROSCOPY TIME: 2 minutes 11 seconds COMPARISON: Lumbar spine MRI - 10/05/2013 FINDINGS: Three spot intraoperative radiographic images of the lumbar spine are provided for review. Initial image demonstrates a marking instrument overlying the previously noted severe (greater than 70%) compression deformity / vertebral plana of the L2 vertebral body. Subsequent images demonstrate at least partial resection of the L2 vertebral body with  placement of a vertebral body spacer prosthesis and left-sided side plate fixation with transfixing  cancellous screws seen at L1 and L3. No evidence of hardware failure or loosening. There is restoration of the L2 vertebral body space height. IMPRESSION: Post L2 vertebral body replacement and sideplate fixation without evidence of complication. Electronically Signed By: Simonne ComeJohn Watts M.D. On: 10/12/2013 21:18   Assessment/Plan:  Diagnosis: L2 compression fx s/p fusion  1. Does the need for close, 24 hr/day medical supervision in concert with the patient's rehab needs make it unreasonable for this patient to be served in a less intensive setting? Yes 2. Co-Morbidities requiring supervision/potential complications: previous CVA, htn 3. Due to bladder management, bowel management, safety, skin/wound care, disease management, medication administration, pain management and patient education, does the patient require 24 hr/day rehab nursing? Yes 4. Does the patient require coordinated care of a physician, rehab nurse, PT (1-2 hrs/day, 5 days/week) and OT (1-2 hrs/day, 5 days/week) to address physical and functional deficits in the context of the above medical diagnosis(es)? Yes Addressing deficits in the following areas: balance, endurance, locomotion, strength, transferring, bowel/bladder control, bathing, dressing, feeding, grooming, toileting and psychosocial support 5. Can the patient actively participate in an intensive therapy program of at least 3 hrs of therapy per day at least 5 days per week? Yes 6. The potential for patient to make measurable gains while on inpatient rehab is good 7. Anticipated functional outcomes upon discharge from inpatient rehab are supervision with PT, supervision and min assist with OT, n/a with SLP. 8. Estimated rehab length of stay to reach the above functional goals is: 10-15 days 9. Does the patient have adequate social supports to accommodate these discharge functional goals? Yes 10. Anticipated D/C setting: Home 11. Anticipated post D/C treatments: HH therapy and  Outpatient therapy 12. Overall Rehab/Functional Prognosis: excellent RECOMMENDATIONS:  This patient's condition is appropriate for continued rehabilitative care in the following setting: CIR  Patient has agreed to participate in recommended program. Yes  Note that insurance prior authorization may be required for reimbursement for recommended care.  Comment: Rehab Admissions Coordinator to follow up.  Thanks,  [name], MD, [place] DomFAAPMR  10/13/2013

## 2013-10-17 NOTE — Discharge Instructions (Signed)

## 2013-10-17 NOTE — Progress Notes (Signed)
PMR Admission Coordinator Pre-Admission Assessment  Patient: Mark Joseph is an 80 y.o., male  MRN: [id number]  DOB: 10/29/1921  Height: 5\' 9"  (175.3 cm)  Weight: 67.586 kg (149 lb)  Insurance Information  HMO: yes PPO: PCP: IPA: 80/20: OTHER: medicare advantage plan  PRIMARY: Blue Medicare Policy#: [id number] Subscriber: pt  CM Name: Cedric Phone#: [phone] Fax#: [phone]  Pre-Cert#: [id number] Employer: retired/[employer]  Benefits: Phone #: [phone] Name: 10/16/13  Eff. Date: 05/11/13 Deduct: none Out of Pocket Max: $4500 Life Max: none  CIR: 100 % coverage SNF: 100%coverage for 100 days  Outpatient: 100% Co-Pay: no visit limit  Home Health: 100% Co-Pay: no visit limit  DME: 80% Co-Pay: 20%  Providers: in network   SECONDARY: none  Medicaid Application Date: Case Manager:  Disability Application Date: Case Worker:  Emergency Games developerContact Information    Contact Information     Name  Relation  Home  Work  Mobile     Mccollum,Beverly  Spouse  [phone]   [phone]        Current Medical History  Patient Admitting Diagnosis: L2 compression fx s/p fusion  History of Present Illness: Mark CroonLeon A Moccio is a 80 y.o. right-handed male with history of CVA September 2013. Patient well-known rehabilitation services 07/27/2013 secondary to deconditioning related to A. fib with RVR as well as recent L2 compression fracture after a fall with conservative care. He was discharged home from rehabilitation services.  Presented 10/06/2013 with increasing back pain and urinary incontinence. MRI of lumbar spine showed severe compression and retropulsion of known L2 compression fracture. Underwent lumbar 2 polypectomy, lumbar L1-3 arthrodesis 10/12/2013 per Dr. Mikal Planeabell. Postoperative pain management. Lumbar corset when out of bed. Patient on Eliquis for history of atrial fibrillation RVR prior to admission await plan to resume.  Past Medical History    Past Medical History    Diagnosis  Date    .   Hypertension     .  GERD (gastroesophageal reflux disease)     .  Osteoporosis     .  High cholesterol     .  Stroke  01/2012      "small; affected his speech; pretty much back to normal now" (09/15/2012)    .  Pneumonitis  1971    .  Arthritis       "knees; right is worse" (09/15/2012)     Family History  family history is not on file.  Prior Rehab/Hospitalizations: CIR 07/27/13- 08/09/13 and d/c home at supervision level  Current Medications  Current facility-administered medications:0.9 % NaCl with KCl 20 mEq/ L infusion, , Intravenous, Continuous, Carmela HurtKyle L Cabbell, MD, 75 mL/hr at 10/12/13 2236; acetaminophen (TYLENOL) suppository 650 mg, 650 mg, Rectal, Q6H PRN, Carmela HurtKyle L Cabbell, MD; acetaminophen (TYLENOL) tablet 650 mg, 650 mg, Oral, Q6H PRN, Carmela HurtKyle L Cabbell, MD  alum & mag hydroxide-simeth (MAALOX/MYLANTA) 200-200-20 MG/5ML suspension 30 mL, 30 mL, Oral, Q6H PRN, Carmela HurtKyle L Cabbell, MD, 30 mL at 10/16/13 1002; amiodarone (PACERONE) tablet 100 mg, 100 mg, Oral, Once per day on Sun Sat, Kyle L Cabbell, MD, 100 mg at 10/15/13 1002; amiodarone (PACERONE) tablet 200 mg, 200 mg, Oral, Once per day on Mon Tue Wed Thu Fri, Kyle L Cabbell, MD, 200 mg at 10/17/13 [id number]  bisacodyl (DULCOLAX) EC tablet 5 mg, 5 mg, Oral, Daily PRN, Carmela HurtKyle L Cabbell, MD, 5 mg at 10/16/13 0356; calcium carbonate (OS-CAL - dosed in mg of elemental calcium) tablet 1,250 mg, 1,250 mg, Oral, BID, Carmela HurtKyle L Cabbell, MD,  1,250 mg at 10/17/13 2800; diazepam (VALIUM) tablet 5 mg, 5 mg, Oral, Q6H PRN, Carmela Hurt, MD; docusate sodium (COLACE) capsule 100 mg, 100 mg, Oral, BID, Carmela Hurt, MD, 100 mg at 10/16/13 1002  feeding supplement (ENSURE COMPLETE) (ENSURE COMPLETE) liquid 237 mL, 237 mL, Oral, BID BM, Haynes Bast, RD, 237 mL at 10/17/13 1132; finasteride (PROSCAR) tablet 5 mg, 5 mg, Oral, Daily, Carmela Hurt, MD, 5 mg at 10/17/13 3491; hydrochlorothiazide (MICROZIDE) capsule 12.5 mg, 12.5 mg, Oral, Daily, Judie Bonus Hammons,  RPH, 12.5 mg at 10/17/13 7915  HYDROcodone-acetaminophen (NORCO/VICODIN) 5-325 MG per tablet 1-2 tablet, 1-2 tablet, Oral, Q4H PRN, Carmela Hurt, MD, 1 tablet at 10/16/13 0356; lisinopril (PRINIVIL,ZESTRIL) tablet 10 mg, 10 mg, Oral, Daily, Judie Bonus Hammons, RPH, 10 mg at 10/17/13 0569; magic mouthwash, 15 mL, Oral, TID PRN, Barnett Abu, MD; menthol-cetylpyridinium (CEPACOL) lozenge 3 mg, 1 lozenge, Oral, PRN, Carmela Hurt, MD  metoprolol tartrate (LOPRESSOR) tablet 12.5 mg, 12.5 mg, Oral, BID, Carmela Hurt, MD, 12.5 mg at 10/17/13 7948; morphine 2 MG/ML injection 1-4 mg, 1-4 mg, Intravenous, Q3H PRN, Carmela Hurt, MD; multivitamin with minerals tablet 1 tablet, 1 tablet, Oral, Daily, Haynes Bast, RD; ondansetron (ZOFRAN) injection 4 mg, 4 mg, Intravenous, Q4H PRN, Carmela Hurt, MD  pantoprazole (PROTONIX) EC tablet 40 mg, 40 mg, Oral, Daily, Carmela Hurt, MD, 40 mg at 10/17/13 0937; phenol (CHLORASEPTIC) mouth spray 1 spray, 1 spray, Mouth/Throat, PRN, Carmela Hurt, MD; polyethylene glycol (MIRALAX / GLYCOLAX) packet 17 g, 17 g, Oral, Daily PRN, Carmela Hurt, MD, 17 g at 10/15/13 1803; simvastatin (ZOCOR) tablet 10 mg, 10 mg, Oral, q1800, Carmela Hurt, MD, 10 mg at 10/15/13 1706  sodium chloride 0.9 % injection 3 mL, 3 mL, Intravenous, Q12H, Carmela Hurt, MD, 3 mL at 10/16/13 2216; sodium chloride 0.9 % injection 3 mL, 3 mL, Intravenous, PRN, Carmela Hurt, MD; tamsulosin (FLOMAX) capsule 0.4 mg, 0.4 mg, Oral, QPC breakfast, Carmela Hurt, MD, 0.4 mg at 10/17/13 0165  Patients Current Diet: General Pt with poor appetite this admission. Wife plans to bring food from the outside  Precautions / Restrictions  Precautions  Precautions: Back;Fall  Precaution Comments: Pt able to describe precautions; educated.  Spinal Brace: Lumbar corset;Applied in sitting position  Restrictions  Weight Bearing Restrictions: No  Prior Activity Level   Journalist, newspaper / Equipment   Home Assistive Devices/Equipment: Eyeglasses;Walker (specify type)  Home Equipment: Bedside commode;Cane - quad;Cane - single point;Walker - 2 wheels  Prior Functional Level  Prior Function  Level of Independence: Independent  Comments: Prior to initial fall, pt was independent, driving, going to the [facility] daily. since fall, pt has been having more difficulty with basic self care. (since fall was at rehab and then recieving HHPT)  Current Functional Level    Cognition  Overall Cognitive Status: Within Functional Limits for tasks assessed  Orientation Level: Oriented X4    Extremity Assessment  (includes Sensation/Coordination)      ADLs  Overall ADL's : Needs assistance/impaired  Eating/Feeding: Modified independent  Grooming: Wash/dry face;Oral care;Sitting;Standing;Minimal assistance  Upper Body Bathing: Minimal assitance;Sitting  Lower Body Bathing: Maximal assistance  Upper Body Dressing : Set up;Supervision/safety;Sitting (brace)  Lower Body Dressing: Minimal assistance;Sitting/lateral leans (socks)  Toilet Transfer: Minimal assistance;Ambulation;BSC;RW  Toileting- Clothing Manipulation and Hygiene: Maximal assistance  Functional mobility during ADLs: Minimal assistance;Rolling walker  General ADL Comments: Pt having difficulty standing upright at sink-OT  giving cues to try to get pt to stand more upright-few resting breaks taken and pt performing part of grooming in sitting position. Educated on use of cups for teeth care and placement of grooming items. Educated to have clothing under back brace and to not sleep in it. Practiced with AE for LB ADLs and educated. Pt able to ambulate some in hallway.    Mobility  Overal bed mobility: Needs Assistance  Bed Mobility: Rolling;Sidelying to Sit  Rolling: Min assist  Sidelying to sit: Mod assist  Sit to sidelying: Max assist  General bed mobility comments: Assistance with rolling as well as with trunk to come to sitting position.     Transfers  Overall transfer level: Needs assistance  Equipment used: Rolling walker (2 wheeled)  Transfers: Sit to/from Stand  Sit to Stand: Min assist;Mod assist  Stand pivot transfers: Min assist  General transfer comment: Mod A to stand from bed. Min A from [facility] and chair. Cues for technique.    Ambulation / Gait / Stairs / Wheelchair Mobility  Ambulation/Gait  Ambulation/Gait assistance: Occupational psychologist (Feet): 100 Feet  Assistive device: Rolling walker (2 wheeled)  Gait Pattern/deviations: Step-through pattern;Shuffle;Trunk flexed  Gait velocity: decreased  Gait velocity interpretation: Below normal speed for age/gender  General Gait Details: Pt with significant trunk flexion during gait and when asked to stand as tall as he can, no change in his trunk height. Shuffing pattern with cues to stay inside of RW especially while turning or navigating tight spaces in the room.    Posture / Balance  Dynamic Sitting Balance  Sitting balance - Comments: falling to R occasionally    Special needs/care consideration  Skin intact  Bowel Management laxatives given 10/16/13 with good results  Bladder mgmt: foley    Previous Home Environment  Living Arrangements: Spouse/significant other  Lives With: Spouse  Available Help at Discharge: Family;Available 24 hours/day  Type of Home: House  Home Layout: Two level;Able to live on main level with bedroom/bathroom  Home Access: Stairs to enter  Entrance Stairs-Rails: None  Entrance Stairs-Number of Steps: 2  Bathroom Shower/Tub: Pension scheme manager: Standard  Bathroom Accessibility: Yes  How Accessible: Accessible via walker  Home Care Services: Yes (has had [facility] after last rehab stay 07/2013)  Home Care Agency (if known): [facility] in the past  Discharge Living Setting  Plans for Discharge Living Setting: Patient's home;Lives with (comment) (spouse)  Type of Home at Discharge: House  Discharge Home Layout: Two  level;Able to live on main level with bedroom/bathroom  Discharge Home Access: Stairs to enter  Entrance Stairs-Rails: None  Entrance Stairs-Number of Steps: 2  Discharge Bathroom Shower/Tub: Walk-in shower  Discharge Bathroom Toilet: Standard  Discharge Bathroom Accessibility: Yes  How Accessible: Accessible via walker  Does the patient have any problems obtaining your medications?: No  Social/Family/Support Systems  Patient Roles: Spouse  Contact Information: [name], wife  Anticipated Caregiver: wife  Anticipated Caregiver's Contact Information: see above  Ability/Limitations of Caregiver: supervision level  Caregiver Availability: 24/7  Discharge Plan Discussed with Primary Caregiver: Yes  Is Caregiver In Agreement with Plan?: Yes  Does Caregiver/Family have Issues with Lodging/Transportation while Pt is in Rehab?: No  Goals/Additional Needs  Patient/Family Goal for Rehab: supervision with PT and OT  Expected length of stay: ELOS 10 to 15 days  Dietary Needs: poor appetite  Special Service Needs: bed and chair alarm  Pt/Family Agrees to Admission and willing to participate:  Yes  Program Orientation Provided & Reviewed with Pt/Caregiver Including Roles & Responsibilities: Yes  Decrease burden of Care through IP rehab admission: n/a  Possible need for SNF placement upon discharge:not expected  Patient Condition: This patient's medical and functional status has changed since the consult dated:10/13/2013 in which the Rehabilitation Physician determined and documented that the patient's condition is appropriate for intensive rehabilitative care in an inpatient rehabilitation facility. See "History of Present Illness" (above) for medical update. Functional changes are: min assist. Patient's medical and functional status update has been discussed with the Rehabilitation physician and patient remains appropriate for inpatient rehabilitation. Will admit to inpatient rehab today.   Preadmission Screen Completed By: Clois Dupes, 10/17/2013 11:45 AM  ______________________________________________________________________  Discussed status with Dr. Riley Kill on 10/17/13 at 1145 and received telephone approval for admission today.  Admission Coordinator: Clois Dupes, time 1610 Date 10/17/13    Cosigned by: Ranelle Oyster, MD [10/17/2013 1:11 PM]

## 2013-10-17 NOTE — Progress Notes (Signed)
INITIAL NUTRITION ASSESSMENT  DOCUMENTATION CODES Per approved criteria  -Not Applicable   INTERVENTION:  Ensure Complete po BID, each supplement providing 350 kcals and 13 grams of protein   Snacks daily   Daily multivitamin   RD to continue to follow   NUTRITION DIAGNOSIS: Inadequate oral intake related to poor appetite as evidenced by pt report of poor appetite during current hospitalization.   Goal: Pt to meet >/=90% of estimated nutrition needs  Monitor:  PO intake, supplement acceptance, weight trend, labs   Reason for Assessment: Positive Malnutrition Screening Tool Score   80 y.o. male  Admitting Dx: L2 compression fracture, retropulsion, spinal canal compromise  ASSESSMENT: Pt is s/p mechanical fall March 2015. Xray identified L2 compression fracture.Pt had prolonged stay in hospital and rehab in March 2015. Since discharge pt has been increasing in pain, walking, and urinary incontinence. Pt admitted to [facility] 5/29.  Pt admitted for pain and new onset atrial fibrillation.   Patient is now s/p LUMBAR TWO CORPECTOMY,LUMBAR ONE-LUMBAR THREE ARTHRODESIS on 6/4.   -Pt was very active, "Driving myself to the [facility] everyday" prior to this fall in March -Pt's appetite has been very good up until this hospitalization. States that he does not like the food very much and his appetite is not good. Encouraged pt's wife to bring in food from home or get a subway sandwich if pt will accept better than hospital food  -Pt's wife stated that he was drinking Ensure at home and snacking on peanut butter crackers ---> RD to order  Pt's wife reports that he lost weight in March, but has recently gained most of it back. Epic chart confirms this. Pt has a total weight loss of 3.8% x 1 year, which is not clinically significant.   Nutrition focused physical exam performed. No muscle or subcutaneous fat depletion noticed.   Low sodium Elevated glucose   Height: Ht Readings from Last 1  Encounters:  10/06/13 5\' 9"  (1.753 m)    Weight: Wt Readings from Last 1 Encounters:  10/06/13 149 lb (67.586 kg)    Ideal Body Weight: 72.7 kg   % Ideal Body Weight: 92%   Wt Readings from Last 10 Encounters:  10/06/13 149 lb (67.586 kg)  10/06/13 149 lb (67.586 kg)  09/08/13 149 lb 6.4 oz (67.767 kg)  08/09/13 144 lb 10 oz (65.6 kg)  07/27/13 153 lb 7 oz (69.6 kg)  10/28/12 155 lb 6.4 oz (70.489 kg)  10/06/12 155 lb (70.308 kg)  10/03/12 157 lb 10.1 oz (71.5 kg)  09/19/12 154 lb (69.854 kg)  09/16/12 155 lb 9.6 oz (70.58 kg)    Usual Body Weight: 155 lbs   % Usual Body Weight: 99%   BMI:  Body mass index is 21.99 kg/(m^2)., Normal   Estimated Nutritional Needs: Kcal: 1550 - 1750  Protein: 75 - 85 grams  Fluid: >/= 1.5 L/day   Skin: surgical incision back   Diet Order: General  EDUCATION NEEDS: -No education needs identified at this time   Intake/Output Summary (Last 24 hours) at 10/17/13 1006 Last data filed at 10/17/13 0420  Gross per 24 hour  Intake    240 ml  Output   1350 ml  Net  -1110 ml    Last BM: 6/8   Labs:   Recent Labs Lab 10/12/13 1840  NA 137  K 3.6*    CBG (last 3)  No results found for this basename: GLUCAP,  in the last 72 hours  Scheduled  Meds: . amiodarone  100 mg Oral Once per day on Sun Sat  . amiodarone  200 mg Oral Once per day on Mon Tue Wed Thu Fri  . calcium carbonate  1,250 mg Oral BID  . docusate sodium  100 mg Oral BID  . finasteride  5 mg Oral Daily  . lisinopril  10 mg Oral Daily   And  . hydrochlorothiazide  12.5 mg Oral Daily  . metoprolol tartrate  12.5 mg Oral BID  . pantoprazole  40 mg Oral Daily  . simvastatin  10 mg Oral q1800  . sodium chloride  3 mL Intravenous Q12H  . tamsulosin  0.4 mg Oral QPC breakfast    Continuous Infusions: . 0.9 % NaCl with KCl 20 mEq / L Stopped (10/13/13 1200)    Past Medical History  Diagnosis Date  . Hypertension   . GERD (gastroesophageal reflux disease)    . Osteoporosis   . High cholesterol   . Stroke 01/2012    "small; affected his speech; pretty much back to normal now" (09/15/2012)  . Pneumonitis 1971  . Arthritis     "knees; right is worse" (09/15/2012)    Past Surgical History  Procedure Laterality Date  . Appendectomy  1953  . Inguinal hernia repair Bilateral 09/15/2012    w/mesh Hattie Perch/notes 09/15/2012  . Inguinal hernia repair Bilateral 09/15/2012    Procedure: HERNIA REPAIR INGUINAL ADULT BILATERAL;  Surgeon: Emelia LoronMatthew Wakefield, MD;  [facility];  Service: General;  Laterality: Bilateral;  . Insertion of mesh Bilateral 09/15/2012    Procedure: INSERTION OF MESH;  Surgeon: Emelia LoronMatthew Wakefield, MD;  [facility];  Service: General;  Laterality: Bilateral;    Eppie Gibsonebekah L Matznick, BS Dietetic Intern Pager: [phone]  I agree with the above information and made appropriate revisions. Jarold MottoSamantha Britt Theard MS, RD, LDN Inpatient Registered Dietitian Pager: [phone] After-hours pager: [phone]

## 2013-10-17 NOTE — Progress Notes (Signed)
I have insurance approval to admit pt to inpt rehab today. I will arrange and contact Dr. Franky Macho. [phone]

## 2013-10-18 ENCOUNTER — Inpatient Hospital Stay (HOSPITAL_COMMUNITY): Payer: Medicare Other | Admitting: Physical Therapy

## 2013-10-18 ENCOUNTER — Inpatient Hospital Stay (HOSPITAL_COMMUNITY): Payer: Medicare Other | Admitting: Occupational Therapy

## 2013-10-18 DIAGNOSIS — S32009A Unspecified fracture of unspecified lumbar vertebra, initial encounter for closed fracture: Secondary | ICD-10-CM

## 2013-10-18 DIAGNOSIS — I4891 Unspecified atrial fibrillation: Secondary | ICD-10-CM

## 2013-10-18 DIAGNOSIS — R5381 Other malaise: Secondary | ICD-10-CM

## 2013-10-18 DIAGNOSIS — I10 Essential (primary) hypertension: Secondary | ICD-10-CM

## 2013-10-18 LAB — CBC WITH DIFFERENTIAL/PLATELET
Basophils Absolute: 0 10*3/uL (ref 0.0–0.1)
Basophils Relative: 0 % (ref 0–1)
Eosinophils Absolute: 0.3 10*3/uL (ref 0.0–0.7)
Eosinophils Relative: 4 % (ref 0–5)
HEMATOCRIT: 25.2 % — AB (ref 39.0–52.0)
Hemoglobin: 8.7 g/dL — ABNORMAL LOW (ref 13.0–17.0)
LYMPHS PCT: 22 % (ref 12–46)
Lymphs Abs: 1.4 10*3/uL (ref 0.7–4.0)
MCH: 31.8 pg (ref 26.0–34.0)
MCHC: 34.5 g/dL (ref 30.0–36.0)
MCV: 92 fL (ref 78.0–100.0)
MONO ABS: 0.8 10*3/uL (ref 0.1–1.0)
MONOS PCT: 13 % — AB (ref 3–12)
Neutro Abs: 4 10*3/uL (ref 1.7–7.7)
Neutrophils Relative %: 61 % (ref 43–77)
Platelets: 241 10*3/uL (ref 150–400)
RBC: 2.74 MIL/uL — ABNORMAL LOW (ref 4.22–5.81)
RDW: 13.9 % (ref 11.5–15.5)
WBC: 6.5 10*3/uL (ref 4.0–10.5)

## 2013-10-18 LAB — COMPREHENSIVE METABOLIC PANEL
ALT: 22 U/L (ref 0–53)
AST: 26 U/L (ref 0–37)
Albumin: 2.9 g/dL — ABNORMAL LOW (ref 3.5–5.2)
Alkaline Phosphatase: 56 U/L (ref 39–117)
BUN: 15 mg/dL (ref 6–23)
CO2: 30 meq/L (ref 19–32)
Calcium: 8.9 mg/dL (ref 8.4–10.5)
Chloride: 96 mEq/L (ref 96–112)
Creatinine, Ser: 0.92 mg/dL (ref 0.50–1.35)
GFR, EST AFRICAN AMERICAN: 84 mL/min — AB (ref >90)
GFR, EST NON AFRICAN AMERICAN: 73 mL/min — AB (ref >90)
GLUCOSE: 101 mg/dL — AB (ref 70–99)
Potassium: 4.1 mEq/L (ref 3.7–5.3)
Sodium: 137 mEq/L (ref 137–147)
Total Bilirubin: 0.5 mg/dL (ref 0.3–1.2)
Total Protein: 5.9 g/dL — ABNORMAL LOW (ref 6.0–8.3)

## 2013-10-18 MED ORDER — APIXABAN 5 MG PO TABS
5.0000 mg | ORAL_TABLET | Freq: Two times a day (BID) | ORAL | Status: DC
  Administered 2013-10-18 – 2013-11-03 (×33): 5 mg via ORAL
  Filled 2013-10-18 (×36): qty 1

## 2013-10-18 MED ORDER — ENSURE COMPLETE PO LIQD
237.0000 mL | Freq: Two times a day (BID) | ORAL | Status: DC
  Administered 2013-10-18 – 2013-10-25 (×15): 237 mL via ORAL

## 2013-10-18 NOTE — Progress Notes (Signed)
Physical Therapy Note  Patient Details  Name: ZAYIR GANTERT MRN: [id number] Date of Birth: Feb 24, 1922 Today's Date: 10/18/2013  Time: 1300 - 1345 45 minutes  1:1, c/o 3/10 pain in low back and R "kidney" area, eases with rest.  Session focused on bed mobility, functional transfers, and activity tolerance.  Pt's wife present for session.  Pt ambulated 150' x 2 using RW with min A for balance.  Sit <> stand to mat at 27" to simulate pt's bed at home using RW, required min/mod A for lifting, cueing for UE placement.  Sit <> sidelying and bed mobility with mod A for BLE management and trunk control.  Discussed log rolling with verbal and tactile cueing to maintain precautions, pt did not demonstrate on mat due to pain from brace and per pt and wife report, pt sleeps on L side and not supine.  Sit <> stand at approx. 20" using RW, pt required mod A initially and max A as he became fatigued.  Standing and supine posture with verbal and tactile cueing.  Alyson Ingles 10/18/2013, 2:18 PM

## 2013-10-18 NOTE — Evaluation (Signed)
Occupational Therapy Assessment and Plan And Treatment Session Notes  Patient Details  Name: Mark Joseph MRN: [id number] Date of Birth: January 10, 1922  OT Diagnosis: abnormal posture, lumbago (low back pain) and muscle weakness (generalized) Rehab Potential: Rehab Potential: Good ELOS: 10 days   Today's Date: 10/18/2013 Time: 1100-1225 Time Calculation (min): 85 min   Problem List:  Patient Active Problem List   Diagnosis Date Noted  . Lumbar vertebral fracture 10/17/2013  . Fracture of lumbar spine 10/12/2013  . Compression fracture of L2 lumbar vertebra 10/06/2013  . Physical deconditioning 07/27/2013  . Atrial fibrillation with RVR 07/22/2013  . UTI (urinary tract infection) 10/12/2012  . S/P bilateral inguinal hernia repair 10/03/2012  . Leukocytosis 10/03/2012  . Hematoma 10/03/2012  . CVA (cerebral infarction) 01/30/2012  . Hypertension 01/30/2012    Past Medical History:  Past Medical History  Diagnosis Date  . Hypertension   . GERD (gastroesophageal reflux disease)   . Osteoporosis   . High cholesterol   . Stroke 01/2012    "small; affected his speech; pretty much back to normal now" (09/15/2012)  . Pneumonitis 1971  . Arthritis     "knees; right is worse" (09/15/2012)   Past Surgical History:  Past Surgical History  Procedure Laterality Date  . Appendectomy  1953  . Inguinal hernia repair Bilateral 09/15/2012    w/mesh Mark Joseph 09/15/2012  . Inguinal hernia repair Bilateral 09/15/2012    Procedure: HERNIA REPAIR INGUINAL ADULT BILATERAL;  Surgeon: Mark Bookbinder, MD;  Location: [place];  Service: General;  Laterality: Bilateral;  . Insertion of mesh Bilateral 09/15/2012    Procedure: INSERTION OF MESH;  Surgeon: Mark Bookbinder, MD;  Location: [facility] OR;  Service: General;  Laterality: Bilateral;    Assessment & Plan Clinical Impression: Mark Joseph is a 80 y.o. right-handed male with history of CVA September 2013. Patient well-known rehabilitation services 07/27/2013  secondary to deconditioning related to A. fib with RVR as well as recent L2 compression fracture after a fall with conservative care. He was discharged home from rehabilitation services.   Presented 10/06/2013 with increasing back pain and urinary incontinence. MRI of lumbar spine showed severe compression and retropulsion of known L2 compression fracture. Underwent lumbar 2 polypectomy, lumbar L1-3 arthrodesis 10/12/2013 per Dr. Cyndy Joseph. Postoperative pain management. Lumbar corset when out of bed.  Patient transferred to CIR on 10/17/2013 .    Patient currently requires mod-max with basic self-care skills secondary to muscle weakness and muscle joint tightness and decreased standing balance, decreased postural control, decreased balance strategies and difficulty maintaining precautions.  Prior to hospitalization, patient required assist from wife occasionally for BADL tasks and functional mobility.  Patient will benefit from skilled intervention to decrease level of assist with basic self-care skills prior to discharge home with care partner.  Anticipate patient will require 24 hour supervision and follow up home health.  OT - End of Session Activity Tolerance: Decreased this session Endurance Deficit: Yes Endurance Deficit Description: numerous seated rest breaks needed OT Assessment Rehab Potential: Good OT Patient demonstrates impairments in the following area(s): Balance;Endurance;Pain;Safety OT Basic ADL's Functional Problem(s): Grooming;Bathing;Dressing;Toileting OT Transfers Functional Problem(s): Toilet;Tub/Shower OT Plan OT Intensity: Minimum of 1-2 x/day, 45 to 90 minutes OT Frequency: 5 out of 7 days OT Duration/Estimated Length of Stay: 10 days OT Treatment/Interventions: Balance/vestibular training;Discharge planning;Pain management;Self Care/advanced ADL retraining;Therapeutic Activities;Therapeutic Exercise;Patient/family education;Functional mobility training;DME/adaptive  equipment instruction;Psychosocial support;UE/LE Strength taining/ROM OT Basic Self-Care Anticipated Outcome(s): Supervision-Min assist with LB AE OT Toileting Anticipated Outcome(s): Supervision OT  Bathroom Transfers Anticipated Outcome(s): Supervision for toilet and [place] for shower OT Recommendation Patient destination: Home Follow Up Recommendations: Home health OT Equipment Recommended: Tub/shower seat Equipment Details: Needs shower seat with arm rests, has 3 n 1 for over commode  Skilled Therapeutic Intervention OT Evaluation and self care retraining to include sponge bath and dress as well as reviewed role of OT, inpatient rehab philosophy and goals for OT.  Focused session on maintaining back precautions during all BADL and functional mobility, patient and caregiver (wife Mark Joseph was present) education, activity tolerance, sit><stands, standing tolerance and dynamic balance.    OT Evaluation Precautions/Restrictions  Precautions Precautions: Back;Fall Required Braces or Orthoses: Spinal Brace Spinal Brace: Lumbar corset;Applied in sitting position Restrictions Weight Bearing Restrictions: No Pain 3/10 lower back, rest repositioned. Home Living/Prior Functioning Home Living Available Help at Discharge: Family;Available 24 hours/day Type of Home: House Home Access: Ramped entrance Entrance Stairs-Rails: None Home Layout: Two level;Able to live on main level with bedroom/bathroom Alternate Level Stairs-Number of Steps: flight Additional Comments: Patient uses small walk in shower with door and 3 in 1 during shower while seated the entire time.  No grab bars in bathroom. Lives With: Spouse Vocation: Retired ADL Refer to [employer] below [employer] Overall Cognitive Status: Within Functional Limits for tasks assessed Orientation Level: Oriented X4 Motor  Motor Motor - Skilled Clinical Observations: generalized weakness Mobility  Overall mod-min assist for transfers and  dynamic balance Trunk/Postural Assessment  Cervical Assessment Cervical Assessment: Exceptions to [facility] Cervical AROM Overall Cervical AROM Comments: forward flexed Thoracic Assessment Thoracic Assessment: Exceptions to [facility] (forward flexed, limited ROM due to back precautions) Thoracic AROM Overall Thoracic AROM Comments: forward flexed, limited ROM due to back precautions Lumbar Assessment Lumbar Assessment: Exceptions to [facility] (forward flexed, back precautions) Lumbar AROM Overall Lumbar AROM Comments: forward flexed, back precautions Postural Control Postural Control: Deficits on evaluation (forward flexed) Postural Limitations: H/O forward flexed psoture  Balance Balance Balance Assessed: Yes Static Sitting Balance Static Sitting - Balance Support: Bilateral upper extremity supported Static Sitting - Level of Assistance: 5: Stand by assistance;4: Min assist Dynamic Sitting Balance Dynamic Sitting - Balance Support: Bilateral upper extremity supported Dynamic Sitting - Level of Assistance: 5: Stand by assistance Static Standing Balance Static Standing - Balance Support: Bilateral upper extremity supported;During functional activity Static Standing - Level of Assistance: 4: Min assist Dynamic Standing Balance Dynamic Standing - Balance Support: Bilateral upper extremity supported;During functional activity;No upper extremity supported Dynamic Standing - Level of Assistance: 4: Min assist;3: Mod assist Extremity/Trunk Assessment RUE Assessment RUE Assessment: Within Functional Limits (unable to formally assess due to LBP) LUE Assessment LUE Assessment: Within Functional Limits (Unable to formally assess due to LBP)  FIM:  FIM - Eating Eating Activity: 6: More than reasonable amount of time FIM - Grooming Grooming Steps: Wash, rinse, dry face;Wash, rinse, dry hands;Oral care, brush teeth, clean dentures;Brush, comb hair Grooming: 4: Steadying assist  or patient completes 3 of 4  or 4 of 5 steps FIM - Bathing Bathing Steps Patient Completed: Chest;Right Arm;Left Arm;Abdomen;Front perineal area;Buttocks Bathing: 3: Mod-Patient completes 5-7 [id number] parts or 50-74% FIM - Upper Body Dressing/Undressing Upper body dressing/undressing steps patient completed: Thread/unthread right sleeve of pullover shirt/dresss;Thread/unthread left sleeve of pullover shirt/dress;Put head through opening of pull over shirt/dress;Pull shirt over trunk Upper body dressing/undressing: 5: Set-up assist to: Apply TLSO, cervical collar FIM - Lower Body Dressing/Undressing Lower body dressing/undressing: 1: Total-Patient completed less than 25% of tasks FIM - Tub/Shower Transfers Tub/shower  Transfers: 0-Activity did not occur or was simulated (decreased endurance)   Refer to Care Plan for Long Term Goals  Recommendations for other services: None  Discharge Criteria: Patient will be discharged from OT if patient refuses treatment 3 consecutive times without medical reason, if treatment goals not met, if there is a change in medical status, if patient makes no progress towards goals or if patient is discharged from hospital.  The above assessment, treatment plan, treatment alternatives and goals were discussed and mutually agreed upon: by patient and by family  Prosper Paff, [place] 10/18/2013, 3:52 PM

## 2013-10-18 NOTE — Progress Notes (Signed)
INITIAL NUTRITION ASSESSMENT  DOCUMENTATION CODES Per approved criteria  -Not Applicable   INTERVENTION: Add Ensure Complete po BID, each supplement providing 350 kcals and 13 grams of protein.  RD to continue to follow.   NUTRITION DIAGNOSIS: Inadequate oral intake related to poor appetite as evidenced by pt report of poor appetite during current hospitalization.   Goal: Pt to meet >/=90% of estimated nutrition needs  Monitor:  PO intake, supplement acceptance, weight trend, labs   Reason for Assessment:  Malnutrition Screening Tool   80 y.o. male  Admitting Dx: L2 compression fracture, retropulsion, spinal canal compromise  ASSESSMENT: Pt is s/p mechanical fall March 2015. Xray identified L2 compression fracture.Pt had prolonged stay in hospital and rehab in March 2015. Since discharge pt has been increasing in pain, walking, and urinary incontinence. Pt admitted to [facility] 5/29.  Pt admitted for pain and new onset atrial fibrillation. Admitted to CIR on 6/9.  Patient is now s/p LUMBAR TWO CORPECTOMY,LUMBAR ONE-LUMBAR THREE ARTHRODESIS on 6/4.   Pt was very active, "Driving myself to the [facility] everyday" prior to this fall in March. Pt's appetite has been very good up until this hospitalization. States that he does not like the food very much and his appetite is not good. Encouraged pt's wife to bring in food from home or get a subway sandwich if pt will accept better than hospital food. Pt's wife stated that he was drinking Ensure at home. Pt's wife reports that he lost weight in March, but has recently gained most of it back. EPIC chart confirms this.   Pt currently ordered for a Regular diet and eating 50-75% of meals.  Nutrition focused physical exam performed. No muscle or subcutaneous fat depletion noticed.   Potassium now repleted.  Height: Ht Readings from Last 1 Encounters:  10/17/13 5\' 8"  (1.727 m)    Weight: Wt Readings from Last 1 Encounters:  10/18/13 163 lb 2.3  oz (74 kg)    Ideal Body Weight: 70 kg   % Ideal Body Weight: 106%   Wt Readings from Last 10 Encounters:  10/18/13 163 lb 2.3 oz (74 kg)  10/06/13 149 lb (67.586 kg)  10/06/13 149 lb (67.586 kg)  09/08/13 149 lb 6.4 oz (67.767 kg)  08/09/13 144 lb 10 oz (65.6 kg)  07/27/13 153 lb 7 oz (69.6 kg)  10/28/12 155 lb 6.4 oz (70.489 kg)  10/06/12 155 lb (70.308 kg)  10/03/12 157 lb 10.1 oz (71.5 kg)  09/19/12 154 lb (69.854 kg)    Usual Body Weight: 155 lbs   % Usual Body Weight: 105%   BMI:  Body mass index is 24.81 kg/(m^2). Normal   Estimated Nutritional Needs: Kcal: 1550 - 1750  Protein: 75 - 85 grams  Fluid: >/= 1.5 L/day   Skin:  surgical incision back  Abdomen incision Hip incision  Diet Order: General  EDUCATION NEEDS: -No education needs identified at this time   Intake/Output Summary (Last 24 hours) at 10/18/13 1023 Last data filed at 10/18/13 0900  Gross per 24 hour  Intake    600 ml  Output   1500 ml  Net   -900 ml    Last BM: 6/9  Labs:   Recent Labs Lab 10/12/13 1840 10/18/13 0730  NA 137 137  K 3.6* 4.1  CL  --  96  CO2  --  30  BUN  --  15  CREATININE  --  0.92  CALCIUM  --  8.9  GLUCOSE  --  101*    CBG (last 3)  No results found for this basename: GLUCAP,  in the last 72 hours  Scheduled Meds: . [START ON 10/21/2013] amiodarone  100 mg Oral Once per day on Sun Sat  . amiodarone  200 mg Oral Once per day on Mon Tue Wed Thu Fri  . calcium carbonate  1,250 mg Oral BID  . docusate sodium  100 mg Oral BID  . finasteride  5 mg Oral Daily  . lisinopril  10 mg Oral Daily   And  . hydrochlorothiazide  12.5 mg Oral Daily  . metoprolol tartrate  12.5 mg Oral BID  . pantoprazole  40 mg Oral Daily  . simvastatin  10 mg Oral q1800  . tamsulosin  0.4 mg Oral QPC breakfast    Continuous Infusions:    Past Medical History  Diagnosis Date  . Hypertension   . GERD (gastroesophageal reflux disease)   . Osteoporosis   . High  cholesterol   . Stroke 01/2012    "small; affected his speech; pretty much back to normal now" (09/15/2012)  . Pneumonitis 1971  . Arthritis     "knees; right is worse" (09/15/2012)    Past Surgical History  Procedure Laterality Date  . Appendectomy  1953  . Inguinal hernia repair Bilateral 09/15/2012    w/mesh Hattie Perch/notes 09/15/2012  . Inguinal hernia repair Bilateral 09/15/2012    Procedure: HERNIA REPAIR INGUINAL ADULT BILATERAL;  Surgeon: Emelia LoronMatthew Wakefield, MD;  [facility];  Service: General;  Laterality: Bilateral;  . Insertion of mesh Bilateral 09/15/2012    Procedure: INSERTION OF MESH;  Surgeon: Emelia LoronMatthew Wakefield, MD;  [facility];  Service: General;  Laterality: Bilateral;    Jarold MottoSamantha Aubrey Voong MS, RD, LDN Inpatient Registered Dietitian Pager: [phone] After-hours pager: [phone]

## 2013-10-18 NOTE — Progress Notes (Signed)
Reviewed and in agreement with treatment provided.  

## 2013-10-18 NOTE — Progress Notes (Signed)
Patient information reviewed and entered into eRehab system by Trinity Haun, RN, CRRN, PPS Coordinator.  Information including medical coding and functional independence measure will be reviewed and updated through discharge.     Per nursing patient was given "Data Collection Information Summary for Patients in Inpatient Rehabilitation Facilities with attached "Privacy Act Statement-Health Care Records" upon admission.  

## 2013-10-18 NOTE — Evaluation (Signed)
Physical Therapy Assessment and Plan  Patient Details  Name: Mark Joseph MRN: [id number] Date of Birth: 08/25/1921  PT Diagnosis: Difficulty walking, Low back pain and Muscle weakness Rehab Potential: Good ELOS: 10 days   Today's Date: 10/18/2013 Time: 0730-0825 Time Calculation (min): 55 min  Problem List:  Patient Active Problem List   Diagnosis Date Noted  . Lumbar vertebral fracture 10/17/2013  . Fracture of lumbar spine 10/12/2013  . Compression fracture of L2 lumbar vertebra 10/06/2013  . Physical deconditioning 07/27/2013  . Atrial fibrillation with RVR 07/22/2013  . UTI (urinary tract infection) 10/12/2012  . S/P bilateral inguinal hernia repair 10/03/2012  . Leukocytosis 10/03/2012  . Hematoma 10/03/2012  . CVA (cerebral infarction) 01/30/2012  . Hypertension 01/30/2012    Past Medical History:  Past Medical History  Diagnosis Date  . Hypertension   . GERD (gastroesophageal reflux disease)   . Osteoporosis   . High cholesterol   . Stroke 01/2012    "small; affected his speech; pretty much back to normal now" (09/15/2012)  . Pneumonitis 1971  . Arthritis     "knees; right is worse" (09/15/2012)   Past Surgical History:  Past Surgical History  Procedure Laterality Date  . Appendectomy  1953  . Inguinal hernia repair Bilateral 09/15/2012    w/mesh Archie Endo 09/15/2012  . Inguinal hernia repair Bilateral 09/15/2012    Procedure: HERNIA REPAIR INGUINAL ADULT BILATERAL;  Surgeon: Rolm Bookbinder, MD;  Location: [place];  Service: General;  Laterality: Bilateral;  . Insertion of mesh Bilateral 09/15/2012    Procedure: INSERTION OF MESH;  Surgeon: Rolm Bookbinder, MD;  Location: [facility] OR;  Service: General;  Laterality: Bilateral;    Assessment & Plan Clinical Impression: Patient is a 80 y.o. year old male with recent admission to the hospital on 10/06/2013 with increasing back pain and urinary incontinence. MRI of lumbar spine showed severe compression and retropulsion of  known L2 compression fracture. Underwent lumbar 2 polypectomy, lumbar L1-3 arthrodesis 10/12/2013 per Dr. Cyndy Freeze.  Patient transferred to CIR on 10/17/2013 .   Patient currently requires max with mobility secondary to muscle weakness and decreased sitting balance, decreased standing balance, decreased postural control, decreased balance strategies and difficulty maintaining precautions.  Prior to hospitalization, patient was modified independent  with mobility and lived with Spouse in a House home.  Home access is  Ramped entrance.  Patient will benefit from skilled PT intervention to maximize safe functional mobility, minimize fall risk and decrease caregiver burden for planned discharge home with 24 hour supervision.  Anticipate patient will benefit from follow up [place] at discharge.  PT - End of Session Activity Tolerance: Tolerates 30+ min activity with multiple rests Endurance Deficit: Yes PT Assessment Rehab Potential: Good Barriers to Discharge: Decreased caregiver support PT Patient demonstrates impairments in the following area(s): Balance;Endurance;Motor;Pain;Safety PT Transfers Functional Problem(s): Bed Mobility;Bed to Chair;Car;Furniture PT Locomotion Functional Problem(s): Ambulation;Stairs PT Plan PT Intensity: Minimum of 1-2 x/day ,45 to 90 minutes PT Frequency: 5 out of 7 days PT Duration Estimated Length of Stay: 10 days PT Treatment/Interventions: Ambulation/gait training;Balance/vestibular training;Community reintegration;Discharge planning;Functional mobility training;DME/adaptive equipment instruction;Pain management;Patient/family education;Splinting/orthotics;Stair training;Therapeutic Activities;Therapeutic Exercise;UE/LE Strength taining/ROM PT Transfers Anticipated Outcome(s): supervision PT Locomotion Anticipated Outcome(s): supervision PT Recommendation Follow Up Recommendations: Home health PT Patient destination: Home Equipment Recommended: To be  determined  Skilled Therapeutic Intervention Pt received supine in bed, min A with rolling, instructed pt in log rolling technique with cueing to maintain back precautions.  Max A for sidelying > sit  for trunk support.  Initially pt required mod A for sit <> stand using RW, however as session progressed and pt became fatigued required max A.  Ambulated 150' x 2 and 57' using RW, required min A for balance.  Stair navigation for 5 steps using B handrails, required mod A for balance and eccentric control while descending.  Pt required max cueing for posture while standing, and required frequent seated rest breaks due to fatigue and low endurance.  PT Evaluation Precautions/Restrictions Precautions Precautions: Back;Fall Required Braces or Orthoses: Spinal Brace Spinal Brace: Lumbar corset;Applied in sitting position Restrictions Weight Bearing Restrictions: No  Pain 0/10 pain at rest, 4/10 pain in low back with mobility, eases with rest. Home Living/Prior Functioning Home Living Available Help at Discharge: Family;Available 24 hours/day Type of Home: House Home Access: Ramped entrance Home Layout: Two level;Able to live on main level with bedroom/bathroom Alternate Level Stairs-Number of Steps: flight  Lives With: Spouse Prior Function Level of Independence: Independent with basic ADLs;Independent with gait;Independent with transfers  Able to Take Stairs?: Yes Driving: Yes Vocation: Retired Magazine features editor: Appears Intact Proprioception: Appears Intact Coordination Gross Motor Movements are Fluid and Coordinated: Yes Motor  Motor Motor - Skilled Clinical Observations: generalized weakness  Trunk/Postural Assessment  Cervical Assessment Cervical Assessment: Within Functional Limits Thoracic Assessment Thoracic Assessment:  (forward flexed, limited ROM due to back precautions) Lumbar Assessment Lumbar Assessment:  (forward flexed, back precautions) Postural  Control Postural Control:  (forward flexed)  Balance Balance Balance Assessed: Yes Static Sitting Balance Static Sitting - Balance Support: Bilateral upper extremity supported Static Sitting - Level of Assistance: 5: Stand by assistance;4: Min assist Static Sitting - Comment/# of Minutes: min A initially, progressed to SBA Dynamic Sitting Balance Dynamic Sitting - Balance Support: Bilateral upper extremity supported Dynamic Sitting - Level of Assistance: 4: Min Insurance risk surveyor Standing - Balance Support: Bilateral upper extremity supported Static Standing - Level of Assistance: 4: Min assist Dynamic Standing Balance Dynamic Standing - Balance Support: Bilateral upper extremity supported Dynamic Standing - Level of Assistance: 4: Min assist;3: Mod assist Dynamic Standing - Comments: mod A initially, progressed to min A throughout session Extremity Assessment      RLE Assessment RLE Assessment: Exceptions to [facility] RLE Strength RLE Overall Strength Comments: generalized weakness, quads < 3/5 LLE Assessment LLE Assessment: Exceptions to [facility] LLE Strength LLE Overall Strength Comments: generalized weakness, knee ext < 3/5  FIM:  FIM - Bed/Chair Transfer Bed/Chair Transfer: 2: Supine > Sit: Max A (lifting assist/Pt. 25-49%);2: Bed > Chair or W/C: Max A (lift and lower assist);2: Chair or W/C > Bed: Max A (lift and lower assist) FIM - Locomotion: Ambulation Locomotion: Ambulation Assistive Devices: Walker - Rolling Locomotion: Ambulation: 3: Travels 150 ft or more with moderate assistance (Pt: 50 - 74%) FIM - Locomotion: Stairs Locomotion: Stairs: 2: Up and Down 4 - 11 stairs with moderate assistance (Pt: 50 - 74%)   Refer to Care Plan for Long Term Goals  Recommendations for other services: None  Discharge Criteria: Patient will be discharged from PT if patient refuses treatment 3 consecutive times without medical reason, if treatment goals not met, if there  is a change in medical status, if patient makes no progress towards goals or if patient is discharged from hospital.  The above assessment, treatment plan, treatment alternatives and goals were discussed and mutually agreed upon: by patient  Kenn File 10/18/2013, 8:41 AM

## 2013-10-18 NOTE — Evaluation (Signed)
Reviewed and in agreement with treatment provided.  

## 2013-10-18 NOTE — Progress Notes (Signed)
  [place] PHYSICAL MEDICINE & REHABILITATION     PROGRESS NOTE    Subjective/Complaints: Had a good night. Pain under control. Ready to start therapies  Objective: Vital Signs: Blood pressure 162/81, pulse 73, temperature 98.3 F (36.8 C), temperature source Oral, resp. rate 19, height 5\' 8"  (1.727 m), weight 74 kg (163 lb 2.3 oz), SpO2 73.00%. No results found. No results found for this basename: WBC, HGB, HCT, PLT,  in the last 72 hours No results found for this basename: NA, K, CL, CO, GLUCOSE, BUN, CREATININE, CALCIUM,  in the last 72 hours CBG (last 3)  No results found for this basename: GLUCAP,  in the last 72 hours  Wt Readings from Last 3 Encounters:  10/18/13 74 kg (163 lb 2.3 oz)  10/06/13 67.586 kg (149 lb)  10/06/13 67.586 kg (149 lb)    Physical Exam:  Constitutional: He is oriented to person, place, and time. He appears well-developed.  HENT:  Head: Normocephalic.  Eyes: EOM are normal.  Neck: Normal range of motion. Neck supple. No thyromegaly present.  Cardiovascular:  Cardiac rate controlled. No murmurs, rubs, gallops  Respiratory: Effort normal and breath sounds normal. No respiratory distress. No wheezes  GI: Soft. Bowel sounds are normal. He exhibits no distension.  Neurological: He is alert and oriented to person, place, and time.  UE's grossly 4/5 proximal to distal. RLE: 2+ RHF, 2- RKE, 4/5 R ankles. LLE: 3+ HF, 3 KE and 4/5 ankles. No gross sensory deficits . Proximal movement limited by pain.  Skin:  Flank incision clean. No skin breakdown on buttock/low back Psychiatric: He has a normal mood and affect. His behavior is normal. Thought content normal    Assessment/Plan: 1. Functional deficits secondary to L2 compression fx, right L2 radiculopathy which require 3+ hours per day of interdisciplinary therapy in a comprehensive inpatient rehab setting. Physiatrist is providing close team supervision and 24 hour management of active medical problems  listed below. Physiatrist and rehab team continue to assess barriers to discharge/monitor patient progress toward functional and medical goals. FIM:                   Comprehension Comprehension Mode: Auditory Comprehension: 6-Follows complex conversation/direction: With extra time/assistive device  Expression Expression Mode: Verbal Expression: 6-Expresses complex ideas: With extra time/assistive device  Social Interaction Social Interaction: 7-Interacts appropriately with others - No medications needed.  Problem Solving Problem Solving: 5-Solves complex 90% of the time/cues < 10% of the time  Memory Memory: 6-More than reasonable amt of time   Medical Problem List and Plan:  1. Functional deficits secondary to L2 compression fracture/radiculopathy status post fusion and arthrodesis. Lumbar corset when out of bed  2. DVT Prophylaxis/Anticoagulation: Discuss resuming Eliquis  3. Pain Management: Hydrocodone and Robaxin as needed. Monitor with increased mobility  4. History CVA 2013. Discuss resuming Eliquis  5. Neuropsych: This patient is capable of making decisions on his own behalf.  6. Hypertension/atrial fibrillation. Amiodarone 100 mg once per day Saturday Sunday and 200 mg all other days, Lopressor 12.5 mg twice a day, hydrochlorothiazide 12.5 mg daily, lisinopril 10 mg daily.    7. BPH. Proscar 5 mg daily and Flomax 0.4 mg daily. Check PVRs x3 encourage patients to stand to void  8. Hyperlipidemia. Zocor  9. GERD. Protonix    LOS (Days) 1 A FACE TO FACE EVALUATION WAS PERFORMED  Megyn Leng T 10/18/2013 7:45 AM

## 2013-10-19 ENCOUNTER — Encounter (HOSPITAL_COMMUNITY): Payer: Self-pay | Admitting: Neurosurgery

## 2013-10-19 ENCOUNTER — Inpatient Hospital Stay (HOSPITAL_COMMUNITY): Payer: Medicare Other | Admitting: Physical Therapy

## 2013-10-19 ENCOUNTER — Encounter (HOSPITAL_COMMUNITY): Payer: Medicare Other | Admitting: Occupational Therapy

## 2013-10-19 ENCOUNTER — Inpatient Hospital Stay (HOSPITAL_COMMUNITY): Payer: Medicare Other | Admitting: Occupational Therapy

## 2013-10-19 NOTE — Care Management Note (Signed)
[facility]patient [facility] Individual Statement of Services  Patient Name:  Mark Joseph  Date:  10/19/2013  Welcome to the [facility]patient [facility] Center.  Our goal is to provide you with an individualized program based on your diagnosis and situation, designed to meet your specific needs.  With this comprehensive rehabilitation program, you will be expected to participate in at least 3 hours of rehabilitation therapies Monday-Friday, with modified therapy programming on the weekends.  Your rehabilitation program will include the following services:  Physical Therapy (PT), Occupational Therapy (OT), 24 hour per day rehabilitation nursing, Therapeutic Recreaction (TR), Case Management (Social Worker), Rehabilitation Medicine, Nutrition Services and Pharmacy Services  Weekly team conferences will be held on Tuesdays to discuss your progress.  Your Social Worker will talk with you frequently to get your input and to update you on team discussions.  Team conferences with you and your family in attendance may also be held.  Expected length of stay: 10 days  Overall anticipated outcome: supervision                                                                                                                       (may need some assistance with dressing)  Depending on your progress and recovery, your program may change. Your Social Worker will coordinate services and will keep you informed of any changes. Your Social Worker's name and contact numbers are listed  below.  The following services may also be recommended but are not provided by the Inpatient Rehabilitation Center:   Driving Evaluations  Home Health Rehabiltiation Services  Outpatient Rehabilitation Services    Arrangements will be made to provide these services after discharge if needed.  Arrangements include referral to agencies that provide these services.  Your insurance has been verified to be:  [employer] Your primary  doctor is:  Dr. Kirby Funk  Pertinent information will be shared with your doctor and your insurance company.  Social Worker:  [place], [place] [phone] or ([phone]   Information discussed with and copy given to patient by: Amada Jupiter, 10/19/2013, 12:44 PM

## 2013-10-19 NOTE — IPOC Note (Signed)
Overall Plan of Care [facility]) Patient Details Name: Mark Joseph MRN: [id number] DOB: 1922-04-06  Admitting Diagnosis: LUMBAR FX  Hospital Problems: Active Problems:   Lumbar vertebral fracture     Functional Problem List: Nursing Skin Integrity;Pain;Bowel;Bladder  PT Balance;Endurance;Motor;Pain;Safety  OT Balance;Endurance;Pain;Safety  SLP    TR         Basic ADL's: OT Grooming;Bathing;Dressing;Toileting     Advanced  ADL's: OT       Transfers: PT Bed Mobility;Bed to Chair;Car;Furniture  OT Toilet;Tub/Shower     Locomotion: PT Ambulation;Stairs     Additional Impairments: OT    SLP        TR      Anticipated Outcomes Item Anticipated Outcome  Self Feeding    Swallowing      Basic self-care  Supervision-Min assist with LB AE  Toileting  Supervision   Bathroom Transfers Supervision for toilet and Min Assist for shower  Bowel/Bladder  Mod I  Transfers  supervision  Locomotion  supervision  Communication     Cognition     Pain  <5  Safety/Judgment  Supervision   Therapy Plan: PT Intensity: Minimum of 1-2 x/day ,45 to 90 minutes PT Frequency: 5 out of 7 days PT Duration Estimated Length of Stay: 10 days OT Intensity: Minimum of 1-2 x/day, 45 to 90 minutes OT Frequency: 5 out of 7 days OT Duration/Estimated Length of Stay: 10 days         Team Interventions: Nursing Interventions Patient/Family Education;Bowel Management;Pain Management;Skin Care/Wound Management  PT interventions Ambulation/gait training;Balance/vestibular training;Community reintegration;Discharge planning;Functional mobility training;DME/adaptive equipment instruction;Pain management;Patient/family education;Splinting/orthotics;Stair training;Therapeutic Activities;Therapeutic Exercise;UE/LE Strength taining/ROM  OT Interventions Balance/vestibular training;Discharge planning;Pain management;Self Care/advanced ADL retraining;Therapeutic Activities;Therapeutic  Exercise;Patient/family education;Functional mobility training;DME/adaptive equipment instruction;Psychosocial support;UE/LE Strength taining/ROM  SLP Interventions    TR Interventions    SW/CM Interventions Discharge Planning;Psychosocial Support;Patient/Family Education    Team Discharge Planning: Destination: PT-Home ,OT- Home , SLP-  Projected Follow-up: PT-Home health PT, OT-  Home health OT, SLP-  Projected Equipment Needs: PT-To be determined, OT- Tub/shower seat, SLP-  Equipment Details: PT- , OT-Needs shower seat with arm rests, has 3 n 1 for over commode Patient/family involved in discharge planning: PT- Patient,  OT-Patient;Family member/caregiver, SLP-   MD ELOS: 10-14 Medical Rehab Prognosis:  Good Assessment: 80 y.o. right-handed male with history of CVA September 2013. Patient well-known rehabilitation services 07/27/2013 secondary to deconditioning related to A. fib with RVR as well as recent L2 compression fracture after a fall with conservative care. He was discharged home from rehabilitation services ambulating 200 feet with her walker and minimal assistance. Presented 10/06/2013 with increasing back pain and urinary incontinence. MRI of lumbar spine showed severe compression and retropulsion of known L2 compression fracture. Underwent lumbar 2 polypectomy, lumbar L1-3 arthrodesis 10/12/2013 per Dr. Mikal Plane   Now requiring 24/7 Rehab RN,MD, as well as CIR level PT, OT .  Treatment team will focus on ADLs and mobility with goals set at Sup   See Team Conference Notes for weekly updates to the plan of care

## 2013-10-19 NOTE — Progress Notes (Signed)
Social Work  Social Work Assessment and Plan  Patient Details  Name: Mark Joseph MRN: [id number] Date of Birth: 11/23/1921  Today's Date: 10/19/2013  Problem List:  Patient Active Problem List   Diagnosis Date Noted  . Lumbar vertebral fracture 10/17/2013  . Fracture of lumbar spine 10/12/2013  . Compression fracture of L2 lumbar vertebra 10/06/2013  . Physical deconditioning 07/27/2013  . Atrial fibrillation with RVR 07/22/2013  . UTI (urinary tract infection) 10/12/2012  . S/P bilateral inguinal hernia repair 10/03/2012  . Leukocytosis 10/03/2012  . Hematoma 10/03/2012  . CVA (cerebral infarction) 01/30/2012  . Hypertension 01/30/2012   Past Medical History:  Past Medical History  Diagnosis Date  . Hypertension   . GERD (gastroesophageal reflux disease)   . Osteoporosis   . High cholesterol   . Stroke 01/2012    "small; affected his speech; pretty much back to normal now" (09/15/2012)  . Pneumonitis 1971  . Arthritis     "knees; right is worse" (09/15/2012)   Past Surgical History:  Past Surgical History  Procedure Laterality Date  . Appendectomy  1953  . Inguinal hernia repair Bilateral 09/15/2012    w/mesh Hattie Perch/notes 09/15/2012  . Inguinal hernia repair Bilateral 09/15/2012    Procedure: HERNIA REPAIR INGUINAL ADULT BILATERAL;  Surgeon: Emelia LoronMatthew Wakefield, MD;  [facility];  Service: General;  Laterality: Bilateral;  . Insertion of mesh Bilateral 09/15/2012    Procedure: INSERTION OF MESH;  Surgeon: Emelia LoronMatthew Wakefield, MD;  [facility];  Service: General;  Laterality: Bilateral;  . Anterior lat lumbar fusion Left 10/12/2013    Procedure: LUMBAR TWO CORPECTOMY,LUMBAR ONE-LUMBAR THREE ARTHRODESIS.;  Surgeon: Carmela HurtKyle L Cabbell, MD;  Location: [facility] ORS;  Service: Neurosurgery;  Laterality: Left;   Social History:  reports that he quit smoking about 45 years ago. His smoking use included Cigarettes. He has a 12.5 pack-year smoking history. He has never used smokeless tobacco. He  reports that he does not drink alcohol or use illicit drugs.  Family / Support Systems Marital Status: Married Patient Roles: Spouse Spouse/Significant Other: wife, Louretta ParmaBeverly Gruwell @ [phone](H) [phone] or [name](C) [phone] Children: one daughter, Misty StanleyLisa [facility]) Anticipated Caregiver: wife Ability/Limitations of Caregiver: supervision level only - cannot provide physical assistance Caregiver Availability: 24/7 Family Dynamics: wife very involved and supportive.  Ready to provide assist as she is able, however, with physcial limitations.  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  Feels he is losing function instead of improving.  Denies any signifiicant emotional distress, however, will monitor through CIR stay.  Hope that with improvement on unit he will  be pleased. Recent Psychosocial Issues: On CIR 07/2013 for conservative care of [id number].  Was able to d/c home at supervision level. Pyschiatric History: None Substance Abuse History: None  Patient / Family Perceptions, Expectations & Goals Pt/Family understanding of illness & functional limitations: pt and wife with good understanding of surgery that was performed and current back precautions and functional limitations/ need for CIR. Premorbid pt/family roles/activities: Pt was requiring increasingly more physical  assist at home prior to admit.  Wife was providing  caregiver support, however, his care needs were approaching greater than she could provide. Anticipated changes in roles/activities/participation: Little change anticipated in roles as wife will continue as caregiver.  Hopes to improve physically back to supervision LOF. Pt/family expectations/goals: pt and wife hope to reach a supervision level  [employer]: None Premorbid Home Care/DME Agencies: Other (Comment) [facility] after CIR in March) Transportation available at discharge: yes  Discharge Planning Living Arrangements: Spouse/significant other Support Systems: Spouse/significant other;Children;Church/faith community;Friends/neighbors Type of Residence: Private residence [employer] Resources: [employer] Surveyor, quantity Resources: Restaurant manager, fast food Screen Referred: No Living Expenses: Own Money Management: Spouse Does the patient have any problems obtaining your medications?: No Home Management: wife primarily since March Patient/Family Preliminary Plans: Pt hopes to return home with wife as caregiver Barriers to Discharge: Self care;Family Support (wife cannot provide much physical assistance) Social Work Anticipated Follow Up Needs: HH/OP Expected length of stay: ELOS 10  days  Clinical Impression Pleasant gentleman here following back surgery who is familiar to CIR from stay in March 2015.  Wife remains as attentive as she was with prior stay.  Really need to reach a supervision level for wife to manage at home.  Both admit frustration with recovery over past few months, but hopeful, having finally had surgical intervention, that "things will be a lot better."  Will follow for support and d/c planning.  Lucia Mccreadie 10/19/2013, 12:42 PM

## 2013-10-19 NOTE — Progress Notes (Signed)
Physical Therapy Note  Patient Details  Name: Mark Joseph MRN: [id number] Date of Birth: 11-03-1921 Today's Date: 10/19/2013  Time 1: 0730 - 0830 60 minutes 1:1, c/o pain in low back and L flank, eases with rest and repositioning.  Session focused on bed mobility, functional mobility, activity tolerance, and LE strengthening.  Pt received supine in bed, performed bed mobility for positioning, required supervision and cueing for scooting toward HOB, min A for rolling with cueing for no BAT.  Multiple sidelying <> sit with mod A initially for B LE management, progressed to min A, cueing for sequencing.  Multiple sit <> stand from various seating throughout room with min A for arm chairs and mod A to stand from bed.  Ambulation throughout room using RW, bouts approx 10' with CGA, verbal and tactile cueing for standing posture.  LE strengthening ther ex, 2 x 5: LAQ (PT assistance for RLE), HS curls against PT manual resistance, seated marching 2-3", standing marching.   Time 2: 0930 - 1000 30 minutes 1:1, c/o 3/10 pain in low back, eases with rest.  Session focused on LE strengthening and activity tolerance.  Pt received sitting in recliner, min A for sit <> stand and standing balance to don pants.  Ambulated 150' using RW with CGA.  NuStep level 3 x 10 minutes for endurance and LE strength.  Pt required mod A to stand from NuStep due to fatigue.  Ambulated 150' using RW, min A for balance, required increased verbal cueing for posture and 1 standing rest break due to fatigue.   Time 3: 1300 - 1345 45 minutes 1:1, c/o pain in R low back, hip, and flank, "shooting" pain during standing, eases with rest.  Session focused on standing balance and posture.  Pt min A initially with sit <> stand, required max A without armrests and when fatigued.  Ambulated 2 x 150' with RW, CGA/min A, required 1 standing rest break during second trial due to fatigue.  3 x 10 horseshoe activity with reaching for standing  balance, L UE support on RW.  3 x 10 scapular retraction while seated for posture, tactile cues.  3 x 10 chin tucks while seated for posture, tactile cues.  Pt left seated in w/c, all needs met and in reach.  Kenn File 10/19/2013, 9:49 AM

## 2013-10-19 NOTE — Progress Notes (Signed)
Occupational Therapy Session Note  Patient Details  Name: Mark Joseph MRN: [id number] Date of Birth: [birth year]  Today's Date: 10/19/2013 Time: 1100-1200 Time Calculation (min): 60 min  Short Term Goals: Week 1:  OT Short Term Goal 1 (Week 1): STG=LTG  Skilled Therapeutic Interventions/Progress Updates:    Pt seen for individual OT treatment session today for ADL retraining/sponge bathe and dress at sink level. Reviewed chart & no new orders for shower at this time, will cont to assess. Skilled therapy session with focus on adhering to back precautions during all BADL and functional mobility/transfers, patient and caregiver (wife Meriam Sprague was present throughout session) education, activity tolerance, sit to stand, standing tolerance and A/E use during BADL's. Pt was overall Min A sit to stand w/ vc's for back precautions/stand upright at sink or w/ RW as pt tends to demonstrate a forward lean in standing. Pt was overall Max A using long handled A/E for LB dressing and bathing today, moves slowly. Pt sitting in w/c w/ wife and social work in room, lunch tray arrived and call bell/phone were in reach.   Therapy Documentation Precautions:  Precautions Precautions: Back;Fall Required Braces or Orthoses: Spinal Brace Spinal Brace: Lumbar corset;Applied in sitting position Restrictions Weight Bearing Restrictions: No General:   Vital Signs: Therapy Vitals Pulse Rate: 91 BP: 134/77 mmHg Pain:  No, pt denies pain ADL:       See FIM for current functional status  Therapy/Group: Individual Therapy  Alm Bustard 10/19/2013, 12:04 PM

## 2013-10-19 NOTE — Progress Notes (Signed)
Reviewed and in agreement with treatment provided.  

## 2013-10-19 NOTE — Progress Notes (Signed)
[place] PHYSICAL MEDICINE & REHABILITATION     PROGRESS NOTE    Subjective/Complaints: Left low back/flank sore. Controlled with meds. Had a good day with therapies yesterday  Objective: Vital Signs: Blood pressure 168/87, pulse 64, temperature 98.1 F (36.7 C), temperature source Oral, resp. rate 18, height 5\' 8"  (1.727 m), weight 74 kg (163 lb 2.3 oz), SpO2 92.00%. No results found.  Recent Labs  10/18/13 0730  WBC 6.5  HGB 8.7*  HCT 25.2*  PLT 241    Recent Labs  10/18/13 0730  NA 137  K 4.1  CL 96  GLUCOSE 101*  BUN 15  CREATININE 0.92  CALCIUM 8.9   CBG (last 3)  No results found for this basename: GLUCAP,  in the last 72 hours  Wt Readings from Last 3 Encounters:  10/18/13 74 kg (163 lb 2.3 oz)  10/06/13 67.586 kg (149 lb)  10/06/13 67.586 kg (149 lb)    Physical Exam:  Constitutional: He is oriented to person, place, and time. He appears well-developed.  HENT:  Head: Normocephalic.  Eyes: EOM are normal.  Neck: Normal range of motion. Neck supple. No thyromegaly present.  Cardiovascular:  Cardiac rate controlled. No murmurs, rubs, gallops  Respiratory: Effort normal and breath sounds normal. No respiratory distress. No wheezes  GI: Soft. Bowel sounds are normal. He exhibits no distension.  Neurological: He is alert and oriented to person, place, and time.  UE's grossly 4/5 proximal to distal. RLE: 2+ RHF, 2- RKE, 4/5 R ankles. LLE: 3+ HF, 3 KE and 4/5 ankles. No gross sensory deficits . Proximal movement limited by pain.  Skin:  Flank incision clean. No skin breakdown on buttock/low back Psychiatric: He has a normal mood and affect. His behavior is normal. Thought content normal    Assessment/Plan: 1. Functional deficits secondary to L2 compression fx, right L2 radiculopathy which require 3+ hours per day of interdisciplinary therapy in a comprehensive inpatient rehab setting. Physiatrist is providing close team supervision and 24 hour  management of active medical problems listed below. Physiatrist and rehab team continue to assess barriers to discharge/monitor patient progress toward functional and medical goals. FIM: FIM - Bathing Bathing Steps Patient Completed: Chest;Right Arm;Left Arm;Abdomen;Front perineal area;Buttocks Bathing: 3: Mod-Patient completes 5-7 7f 10 parts or 50-74%  FIM - Upper Body Dressing/Undressing Upper body dressing/undressing steps patient completed: Thread/unthread right sleeve of pullover shirt/dresss;Thread/unthread left sleeve of pullover shirt/dress;Put head through opening of pull over shirt/dress;Pull shirt over trunk Upper body dressing/undressing: 5: Set-up assist to: Apply TLSO, cervical collar FIM - Lower Body Dressing/Undressing Lower body dressing/undressing: 1: Total-Patient completed less than 25% of tasks        FIM - Bed/Chair Transfer Bed/Chair Transfer: 2: Supine > Sit: Max A (lifting assist/Pt. 25-49%);2: Bed > Chair or W/C: Max A (lift and lower assist);2: Chair or W/C > Bed: Max A (lift and lower assist)  FIM - Locomotion: Ambulation Locomotion: Ambulation Assistive Devices: Walker - Rolling Locomotion: Ambulation: 3: Travels 150 ft or more with moderate assistance (Pt: 50 - 74%)  Comprehension Comprehension Mode: Auditory Comprehension: 6-Follows complex conversation/direction: With extra time/assistive device  Expression Expression Mode: Verbal Expression: 6-Expresses complex ideas: With extra time/assistive device  Social Interaction Social Interaction: 7-Interacts appropriately with others - No medications needed.  Problem Solving Problem Solving: 5-Solves basic 90% of the time/requires cueing < 10% of the time  Memory Memory: 5-Recognizes or recalls 90% of the time/requires cueing < 10% of the time   Medical Problem List  and Plan:  1. Functional deficits secondary to L2 compression fracture/radiculopathy status post fusion and arthrodesis. Lumbar  corset when out of bed  2. DVT Prophylaxis/Anticoagulation:eliquis resumed 3. Pain Management: Hydrocodone and Robaxin as needed. Monitor with increased mobility  4. History CVA 2013. eliquis resumed 5. Neuropsych: This patient is capable of making decisions on his own behalf.  6. Hypertension/atrial fibrillation. Amiodarone 100 mg once per day Saturday Sunday and 200 mg all other days, Lopressor 12.5 mg twice a day, hydrochlorothiazide 12.5 mg daily, lisinopril 10 mg daily.    7. BPH. Proscar 5 mg daily and Flomax 0.4 mg daily.  PVRs x3 need to be done--still emptying frequently 8. Hyperlipidemia. Zocor  9. GERD. Protonix    LOS (Days) 2 A FACE TO FACE EVALUATION WAS PERFORMED  Aarna Mihalko T 10/19/2013 6:56 AM

## 2013-10-20 ENCOUNTER — Ambulatory Visit (HOSPITAL_COMMUNITY): Payer: Medicare Other | Admitting: Physical Therapy

## 2013-10-20 ENCOUNTER — Inpatient Hospital Stay (HOSPITAL_COMMUNITY): Payer: Medicare Other | Admitting: Occupational Therapy

## 2013-10-20 ENCOUNTER — Inpatient Hospital Stay (HOSPITAL_COMMUNITY): Payer: Medicare Other | Admitting: Physical Therapy

## 2013-10-20 DIAGNOSIS — R5381 Other malaise: Secondary | ICD-10-CM

## 2013-10-20 DIAGNOSIS — I10 Essential (primary) hypertension: Secondary | ICD-10-CM

## 2013-10-20 DIAGNOSIS — I4891 Unspecified atrial fibrillation: Secondary | ICD-10-CM

## 2013-10-20 DIAGNOSIS — S32009A Unspecified fracture of unspecified lumbar vertebra, initial encounter for closed fracture: Secondary | ICD-10-CM

## 2013-10-20 NOTE — Progress Notes (Signed)
Physical Therapy Note  Patient Details  Name: Mark Joseph MRN: [id number] Date of Birth: 08/15/1921 Today's Date: 10/20/2013  Time: 0730 - 0815 45 minutes  1:1, c/o 5/10 pain in abdomen, R hip, and R low back, RN and MD aware.  Session focused on bed mobility, functional transfers, and LE strengthening.  Multiple rolling and sidelying <> sit with min A - CGA as session progressed, cueing to maintain back precautions.  Sit > stand from bed with mod/max A due to pain and LE weakness.  Ambulated 100' using RW, pt c/o increased pain in R hip.  Stand > sit with CGA into recliner.  LE strengthening ther ex, 2 x 10: eccentric LAQ with PT assist with R LE, alternating seated march 2-3".  Alyson InglesKibiloski, Deyjah Kindel 10/20/2013, 12:08 PM

## 2013-10-20 NOTE — Progress Notes (Signed)
[place] PHYSICAL MEDICINE & REHABILITATION     PROGRESS NOTE    Subjective/Complaints: Left lower abd pain, constipation, no vomiting or diarrhea Review of Systems - Negative except as above  Objective: Vital Signs: Blood pressure 176/78, pulse 68, temperature 98.9 F (37.2 C), temperature source Oral, resp. rate 18, height 5\' 8"  (1.727 m), weight 74 kg (163 lb 2.3 oz), SpO2 91.00%. No results found.  Recent Labs  10/18/13 0730  WBC 6.5  HGB 8.7*  HCT 25.2*  PLT 241    Recent Labs  10/18/13 0730  NA 137  K 4.1  CL 96  GLUCOSE 101*  BUN 15  CREATININE 0.92  CALCIUM 8.9   CBG (last 3)  No results found for this basename: GLUCAP,  in the last 72 hours  Wt Readings from Last 3 Encounters:  10/18/13 74 kg (163 lb 2.3 oz)  10/06/13 67.586 kg (149 lb)  10/06/13 67.586 kg (149 lb)    Physical Exam:  Constitutional: He is oriented to person, place, and time. He appears well-developed.  HENT:  Head: Normocephalic.  Eyes: EOM are normal.  Neck: Normal range of motion. Neck supple. No thyromegaly present.  Cardiovascular:  Cardiac rate controlled. No murmurs, rubs, gallops  Respiratory: Effort normal and breath sounds normal. No respiratory distress. No wheezes  GI: Soft. Bowel sounds are normal. He exhibits no distension. No tenderness Neurological: He is alert and oriented to person, place, and time.  UE's grossly 4/5 proximal to distal. RLE: 2+ RHF, 2- RKE, 4/5 R ankles. LLE: 3+ HF, 3 KE and 4/5 ankles. No gross sensory deficits . Proximal movement limited by pain.  Skin:  Flank incision clean. No skin breakdown on buttock/low back Psychiatric: He has a normal mood and affect. His behavior is normal. Thought content normal    Assessment/Plan: 1. Functional deficits secondary to L2 compression fx, right L2 radiculopathy which require 3+ hours per day of interdisciplinary therapy in a comprehensive inpatient rehab setting. Physiatrist is providing close team  supervision and 24 hour management of active medical problems listed below. Physiatrist and rehab team continue to assess barriers to discharge/monitor patient progress toward functional and medical goals. FIM: FIM - Bathing Bathing Steps Patient Completed: Chest;Right Arm;Left Arm;Abdomen;Front perineal area;Buttocks;Right upper leg;Left upper leg Bathing: 3: Mod-Patient completes 5-7 6311f 10 parts or 50-74%  FIM - Upper Body Dressing/Undressing Upper body dressing/undressing steps patient completed: Thread/unthread right sleeve of pullover shirt/dresss;Thread/unthread left sleeve of pullover shirt/dress;Put head through opening of pull over shirt/dress;Pull shirt over trunk Upper body dressing/undressing: 5: Set-up assist to: Apply TLSO, cervical collar FIM - Lower Body Dressing/Undressing Lower body dressing/undressing steps patient completed: Thread/unthread right underwear leg;Thread/unthread left underwear leg;Thread/unthread right pants leg;Thread/unthread left pants leg;Pull underwear up/down (Pt was max A using LH reacher) Lower body dressing/undressing: 2: Max-Patient completed 25-49% of tasks  FIM - Toileting Toileting: 0: Activity did not occur  FIM - ArchivistToilet Transfers Toilet Transfers: 0-Activity did not occur  FIM - Games developerBed/Chair Transfer Bed/Chair Transfer: 2: Supine > Sit: Max A (lifting assist/Pt. 25-49%);2: Bed > Chair or W/C: Max A (lift and lower assist);2: Chair or W/C > Bed: Max A (lift and lower assist)  FIM - Locomotion: Ambulation Locomotion: Ambulation Assistive Devices: Walker - Rolling Locomotion: Ambulation: 3: Travels 150 ft or more with moderate assistance (Pt: 50 - 74%)  Comprehension Comprehension Mode: Auditory Comprehension: 7-Follows complex conversation/direction: With no assist  Expression Expression Mode: Verbal Expression: 6-Expresses complex ideas: With extra time/assistive device  Social Interaction  Social Interaction: 7-Interacts appropriately  with others - No medications needed.  Problem Solving Problem Solving: 5-Solves complex 90% of the time/cues < 10% of the time  Memory Memory: 5-Recognizes or recalls 90% of the time/requires cueing < 10% of the time   Medical Problem List and Plan:  1. Functional deficits secondary to L2 compression fracture/radiculopathy status post fusion and arthrodesis. Lumbar corset when out of bed  2. DVT Prophylaxis/Anticoagulation:eliquis resumed 3. Pain Management: Hydrocodone and Robaxin as needed. Monitor with increased mobility  4. History CVA 2013. eliquis resumed 5. Neuropsych: This patient is capable of making decisions on his own behalf.  6. Hypertension/atrial fibrillation. Amiodarone 100 mg once per day Saturday Sunday and 200 mg all other days, Lopressor 12.5 mg twice a day, hydrochlorothiazide 12.5 mg daily, lisinopril 10 mg daily.    7. BPH. Proscar 5 mg daily and Flomax 0.4 mg daily.  PVRs x3 need to be done--still emptying frequently 8. Hyperlipidemia. Zocor  9. GERD. Protonix  10.  Consipation neg abd exam, adjust bowel meds  LOS (Days) 3 A FACE TO FACE EVALUATION WAS PERFORMED  KIRSTEINS,ANDREW E 10/20/2013 10:15 AM

## 2013-10-20 NOTE — Progress Notes (Signed)
Physical Therapy Note  Patient Details  Name: Mark CroonLeon A Supple MRN: [id number] Date of Birth: 01/01/1922 Today's Date: 10/20/2013  Time: 1000-1100 60 minutes  Group therapy  Pt c/o abdominal pain, MD and RN aware.  Pt participated in group therapy session with focus on gait in home and controlled environments, standing balance and sit to stands.  Pt able to stand consistently from w/c level with min A, able to improve posture with cues.  Pt performed gait with RW with min A, close supervision/min A for standing balance.   Rini Moffit 10/20/2013, 11:02 AM

## 2013-10-20 NOTE — Progress Notes (Signed)
Occupational Therapy Session Notes  Patient Details  Name: Mark Joseph MRN: [id number] Date of Birth: 07/06/1921  Today's Date: 10/20/2013 Time: [id number] and 145-225 Time Calculation (min): 70 min and 40 min  Short Term Goals: Week 1:  OT Short Term Goal 1 (Week 1): STG=LTG  Skilled Therapeutic Interventions/Progress Updates:  1) Patient resting in w/c with lumbar corset on upon arrival with wife stating that she had just arrived.   Engaged in self care retraining to include sponge bath (no clearance from MD to shower yet), dress and groom.  Focused session on activity tolerance, sit><saand, standing tolerance and standing balance, use of AE to improve independence and adhere to back precautions during all functional mobility and BADL tasks.  Patient continues to require mod cues for adhering to back precautions during sit><stand however is requiring less cues during BADL tasks and just needs min-mod cues on how to use AE.  Patient and wife requires cues regarding sequencing for bath to include need to have brace on before standing therefore plan to bath dress and donn brace before perform LB bath and dress.  2)  Patient resting in w/c with wife at his side.  Patient sleepy during session yet continued to engage.  Issued elastic shoe laces and adjusted sock aid so patient able to use it.  Practiced donning sock and shoes with assistance needed with both tasks secondary socks were very tight making it difficult to get them off the sock aid.  Powder was applied to inside of sock aid to increase ease of foot to slide.  Patient used reacher and LH shoe horn to assist with donning tennis shoes.  Patient stood to donn button down shirt and required mod assist for this task as well as steady assist.  Therapy Documentation Precautions:  Precautions Precautions: Back;Fall Required Braces or Orthoses: Spinal Brace Spinal Brace: Lumbar corset;Applied in sitting position Restrictions Weight Bearing  Restrictions: No Pain: 1)  1/10 at rest, 4/10 with activity, RN notified and medication provided, rest and repositioned 2)  No c/o pain during session ADL: See FIM for current functional status  Therapy/Group: Individual Therapy both sessions  Catelynn Sparger 10/20/2013, 12:37 PM

## 2013-10-20 NOTE — Progress Notes (Signed)
Reviewed and in agreement with treatment provided.  

## 2013-10-21 ENCOUNTER — Inpatient Hospital Stay (HOSPITAL_COMMUNITY): Payer: Medicare Other

## 2013-10-21 ENCOUNTER — Inpatient Hospital Stay (HOSPITAL_COMMUNITY): Payer: Medicare Other | Admitting: Occupational Therapy

## 2013-10-21 ENCOUNTER — Ambulatory Visit (HOSPITAL_COMMUNITY): Payer: Medicare Other

## 2013-10-21 NOTE — Progress Notes (Addendum)
Physical Therapy Session Note  Patient Details  Name: Mark CroonLeon A Beilfuss MRN: [id number] Date of Birth: 01/09/1922  Today's Date: 10/21/2013 Time: 1130-1200 and 1400-1500 Time Calculation (min): 30 min and 60 min  Short Term Goals: Week 1:  PT Short Term Goal 1 (Week 1): Pt will perform bed mobility with min A PT Short Term Goal 2 (Week 1): Pt will perform functional transfers with min A  Skilled Therapeutic Interventions/Progress Updates:   1:  Pt received sitting in w/c in room and agreeable to therapy. Pt does report to PT that he is not feeling his best today but he will try. PT pushed pt in w/c to gym. Pt completed seated exercises, requiring increased time to completed secondary to fatigue. Exercises included bilateral toe raises, heel raises, LAQ, hip abd and hamstring curls, 1x15 with orange theraband used for added resistance during hip abd and hamstring curls. After a seated rest break pt attempted gait training with RW, requiring mod A for sit to stand transfer with cues for weight shifting and hand placement. Pt ambulated 10 feet before requested to sit down secondary to feeling short of breath and "just not quite right." Vitals obtained and noted below. HR measured with pulse oximeter and manually, obtaining the same reading. Pt reported symptoms resolving after 2-3 minutes rest. Attempted standing again for ambulation and pt requested to sit back down, requiring mod A. Pt reports his heart feels like it is racing and mild dizziness that resolves once sitting. Pt returned to room and positioned for comfort in w/c. Nsg notified of pt's symptoms. Nsg reports pt has been anxious since yesterday about therapy, reporting he does not feeling he needs the assistance anymore and could return home. Nsg to assess pt. Pt on schedule for walking group this pm. Will assess pt's ability to participate.   2: RN reports pt is feeling better after lunch and taking a short nap. Pt reports he is feeling better  this pm and is excited to participate in the group session. Pt participated in group walking session, focusing on home and community ambulation in a controlled environment. Pt ambulated with min A and mod cues to promote upright posture and RW control. Pt completed sit to stand transfers focusing on technique and weight shifting. Pt negotiated up/down stairs with use of bilateral rails with mod A and mod cues.   Therapy Documentation Precautions:  Precautions Precautions: Back;Fall Required Braces or Orthoses: Spinal Brace Spinal Brace: Lumbar corset;Applied in sitting position Restrictions Weight Bearing Restrictions: No Vital Signs: Therapy Vitals Pulse Rate: 84 BP: 117/72 mmHg Patient Position (if appropriate): Sitting Oxygen Therapy SpO2: 90 % O2 Device: None (Room air) Pain: Pain Assessment Pain Assessment: No/denies pain Pain Score: 0-No pain Faces Pain Scale: No hurt      See FIM for current functional status  Therapy/Group: Individual Therapy  Beautiful Pensyl R 10/21/2013, 12:11 PM

## 2013-10-21 NOTE — Progress Notes (Signed)
Occupational Therapy Session Notes  Patient Details  Name: Mark CroonLeon A Banos MRN: [id number] Date of Birth: 04/10/1922  Today's Date: 10/21/2013 Time: 0910-1000 and 100-145 Time Calculation (min): 50 min (missed 10 minutes due to nursing care) and 45 min  Short Term Goals: Week 1:  OT Short Term Goal 1 (Week 1): STG=LTG  Skilled Therapeutic Interventions/Progress Updates:  1)  Patient seated in recliner and eating breakfast upon arrival.  Patient and RN stated that patient needed to complete his breakfast and RN provided medication therefore therapy session started late.  Engaged in self care retraining to include sponge bath and dressing. Focused session on activity tolerance, stand pivot transfer with RW  recliner>w/c, sit><stand, standing tolerance and static and dynamic balance, use of AE to improve independence with LB ADL tasks.  Patient declined to bathe BLEs and due to limited time patient to complete donning slip on slippers with LH shoe horn and reacher as needed with wife to assist only if needed.  2)  Patient resting in w/c upon arrival with wife at his side and just finishing lunch.  Engaged in use of shampoo cap (patient request) with patient using his BUEs to complete washing, drying with towel and combing his hair.  Also practiced sit><stand, ambulate with RW to bed, bed mobility to include maintaining back precautions during sit>side-lying>roll on back>roll right and left.  Patient required mod cues to adhere to back precautions during all transitional movement.  Therapy Documentation Precautions:  Precautions Precautions: Back;Fall Required Braces or Orthoses: Spinal Brace Spinal Brace: Lumbar corset;Applied in sitting position Restrictions Weight Bearing Restrictions: No Pain: 1)  1/10 at rest, 4/10 in back with activity, rest, repositioning, adaptive techniques and AE, activity 2)  4/10 repositioned, rest ADL: See FIM for current functional status  Therapy/Group:  Individual Therapy both sessions  Kelsie Kramp 10/21/2013, 10:31 AM

## 2013-10-21 NOTE — Progress Notes (Signed)
[place] PHYSICAL MEDICINE & REHABILITATION     PROGRESS NOTE    Subjective/Complaints: Left lower abd pain, constipation, no vomiting or diarrhea, brace seems to irritate this Review of Systems - Negative except as above  Objective: Vital Signs: Blood pressure 135/70, pulse 67, temperature 98 F (36.7 C), temperature source Oral, resp. rate 18, height 5\' 8"  (1.727 m), weight 74 kg (163 lb 2.3 oz), SpO2 90.00%. No results found. No results found for this basename: WBC, HGB, HCT, PLT,  in the last 72 hours No results found for this basename: NA, K, CL, CO, GLUCOSE, BUN, CREATININE, CALCIUM,  in the last 72 hours CBG (last 3)  No results found for this basename: GLUCAP,  in the last 72 hours  Wt Readings from Last 3 Encounters:  10/18/13 74 kg (163 lb 2.3 oz)  10/06/13 67.586 kg (149 lb)  10/06/13 67.586 kg (149 lb)    Physical Exam:  Constitutional: He is oriented to person, place, and time. He appears well-developed.  HENT:  Head: Normocephalic.  Eyes: EOM are normal.  Neck: Normal range of motion. Neck supple. No thyromegaly present.  Cardiovascular:  Cardiac rate controlled. No murmurs, rubs, gallops  Respiratory: Effort normal and breath sounds normal. No respiratory distress. No wheezes  GI: Soft. Bowel sounds are normal. He exhibits no distension. No tenderness Neurological: He is alert and oriented to person, place, and time.  UE's grossly 4/5 proximal to distal. RLE: 2+ RHF, 2- RKE, 4/5 R ankles. LLE: 3+ HF, 3 KE and 4/5 ankles. No gross sensory deficits . Proximal movement limited by pain.  Skin:  Flank incision clean. No skin breakdown on buttock/low back Psychiatric: He has a normal mood and affect. His behavior is normal. Thought content normal    Assessment/Plan: 1. Functional deficits secondary to L2 compression fx, right L2 radiculopathy which require 3+ hours per day of interdisciplinary therapy in a comprehensive inpatient rehab setting. Physiatrist is  providing close team supervision and 24 hour management of active medical problems listed below. Physiatrist and rehab team continue to assess barriers to discharge/monitor patient progress toward functional and medical goals. FIM: FIM - Bathing Bathing Steps Patient Completed: Chest;Right Arm;Left Arm;Abdomen;Front perineal area;Right upper leg;Left upper leg Bathing: 3: Mod-Patient completes 5-7 [id number] 10 parts or 50-74%  FIM - Upper Body Dressing/Undressing Upper body dressing/undressing steps patient completed: Thread/unthread right sleeve of pullover shirt/dresss;Thread/unthread left sleeve of pullover shirt/dress;Put head through opening of pull over shirt/dress;Pull shirt over trunk Upper body dressing/undressing: 5: Set-up assist to: Apply TLSO, cervical collar FIM - Lower Body Dressing/Undressing Lower body dressing/undressing steps patient completed: Thread/unthread right underwear leg;Thread/unthread left underwear leg;Thread/unthread right pants leg;Thread/unthread left pants leg;Pull underwear up/down (cues and assist needed for using reacher and LH shoe horn) Lower body dressing/undressing: 2: Max-Patient completed 25-49% of tasks  FIM - Toileting Toileting: 0: Activity did not occur  FIM - ArchivistToilet Transfers Toilet Transfers: 0-Activity did not occur  FIM - Bed/Chair Transfer Bed/Chair Transfer: 2: Supine > Sit: Max A (lifting assist/Pt. 25-49%);2: Bed > Chair or W/C: Max A (lift and lower assist);2: Chair or W/C > Bed: Max A (lift and lower assist)  FIM - Locomotion: Ambulation Locomotion: Ambulation Assistive Devices: Walker - Rolling Locomotion: Ambulation: 3: Travels 150 ft or more with moderate assistance (Pt: 50 - 74%)  Comprehension Comprehension Mode: Auditory Comprehension: 5-Understands complex 90% of the time/Cues < 10% of the time  Expression Expression Mode: Verbal Expression: 6-Expresses complex ideas: With extra time/assistive device  Social  Interaction Social Interaction: 6-Interacts appropriately with others with medication or extra time (anti-anxiety, antidepressant).  Problem Solving Problem Solving: 5-Solves complex 90% of the time/cues < 10% of the time  Memory Memory: 5-Recognizes or recalls 90% of the time/requires cueing < 10% of the time   Medical Problem List and Plan:  1. Functional deficits secondary to L2 compression fracture/radiculopathy status post fusion and arthrodesis 6/4. Lumbar corset when out of bed  2. DVT Prophylaxis/Anticoagulation:eliquis resumed 3. Pain Management: Hydrocodone and Robaxin as needed. Monitor with increased mobility , may have L2 radic as cause of LLQ pain, consider gabapentin 4. History CVA 2013. eliquis resumed 5. Neuropsych: This patient is capable of making decisions on his own behalf.  6. Hypertension/atrial fibrillation. Amiodarone 100 mg once per day Saturday Sunday and 200 mg all other days, Lopressor 12.5 mg twice a day, hydrochlorothiazide 12.5 mg daily, lisinopril 10 mg daily.    7. BPH. Proscar 5 mg daily and Flomax 0.4 mg daily.  PVRs x3 need to be done--still emptying frequently 8. Hyperlipidemia. Zocor  9. GERD. Protonix  10.  Consipation neg abd exam, adjust bowel meds  LOS (Days) 4 A FACE TO FACE EVALUATION WAS PERFORMED  Claudette LawsKIRSTEINS,ANDREW E 10/21/2013 10:11 AM

## 2013-10-22 NOTE — Progress Notes (Signed)
[place] PHYSICAL MEDICINE & REHABILITATION     PROGRESS NOTE    Subjective/Complaints: Left lower abd pain, constipation, doesn't want enema at this point Review of Systems - Negative except as above  Objective: Vital Signs: Blood pressure 152/79, pulse 67, temperature 98.9 F (37.2 C), temperature source Oral, resp. rate 18, height 5\' 8"  (1.727 m), weight 74 kg (163 lb 2.3 oz), SpO2 91.00%. No results found. No results found for this basename: WBC, HGB, HCT, PLT,  in the last 72 hours No results found for this basename: NA, K, CL, CO, GLUCOSE, BUN, CREATININE, CALCIUM,  in the last 72 hours CBG (last 3)  No results found for this basename: GLUCAP,  in the last 72 hours  Wt Readings from Last 3 Encounters:  10/18/13 74 kg (163 lb 2.3 oz)  10/06/13 67.586 kg (149 lb)  10/06/13 67.586 kg (149 lb)    Physical Exam:  Constitutional: He is oriented to person, place, and time. He appears well-developed.  HENT:  Head: Normocephalic.  Eyes: EOM are normal.  Neck: Normal range of motion. Neck supple. No thyromegaly present.  Cardiovascular:  Cardiac rate controlled. No murmurs, rubs, gallops  Respiratory: Effort normal and breath sounds normal. No respiratory distress. No wheezes  GI: Soft. Bowel sounds are normal. He exhibits no distension. No tenderness Neurological: He is alert and oriented to person, place, and time.  UE's grossly 4/5 proximal to distal. RLE: 2+ RHF, 2- RKE, 4/5 R ankles. LLE: 3+ HF, 3 KE and 4/5 ankles. No gross sensory deficits . Proximal movement limited by pain.  Skin:  Flank incision clean. No skin breakdown on buttock/low back Psychiatric: He has a normal mood and affect. His behavior is normal. Thought content normal    Assessment/Plan: 1. Functional deficits secondary to L2 compression fx, right L2 radiculopathy which require 3+ hours per day of interdisciplinary therapy in a comprehensive inpatient rehab setting. Physiatrist is providing close team  supervision and 24 hour management of active medical problems listed below. Physiatrist and rehab team continue to assess barriers to discharge/monitor patient progress toward functional and medical goals. FIM: FIM - Bathing Bathing Steps Patient Completed: Chest;Right Arm;Left Arm;Abdomen;Front perineal area;Buttocks Bathing: 4: Min-Patient completes 8-9 994f 10 parts or 75+ percent (declined to bathe BLEs)  FIM - Upper Body Dressing/Undressing Upper body dressing/undressing steps patient completed: Thread/unthread right sleeve of pullover shirt/dresss;Thread/unthread left sleeve of pullover shirt/dress;Put head through opening of pull over shirt/dress;Pull shirt over trunk Upper body dressing/undressing: 5: Set-up assist to: Apply TLSO, cervical collar FIM - Lower Body Dressing/Undressing Lower body dressing/undressing steps patient completed: Thread/unthread right underwear leg;Thread/unthread left underwear leg;Thread/unthread right pants leg;Thread/unthread left pants leg;Pull underwear up/down (cues and assist needed for using reacher) Lower body dressing/undressing: 2: Max-Patient completed 25-49% of tasks  FIM - Toileting Toileting: 0: Activity did not occur  FIM - ArchivistToilet Transfers Toilet Transfers: 0-Activity did not occur  FIM - BankerBed/Chair Transfer Bed/Chair Transfer Assistive Devices: Environmental consultantWalker;Arm rests Bed/Chair Transfer: 2: Chair or W/C > Bed: Max A (lift and lower assist);2: Bed > Chair or W/C: Max A (lift and lower assist)  FIM - Locomotion: Wheelchair Locomotion: Wheelchair: 0: Activity did not occur FIM - Locomotion: Ambulation Locomotion: Ambulation Assistive Devices: Walker - Rolling Locomotion: Ambulation: 1: Travels less than 50 ft with moderate assistance (Pt: 50 - 74%) (10 feet)  Comprehension Comprehension Mode: Auditory Comprehension: 5-Understands complex 90% of the time/Cues < 10% of the time  Expression Expression Mode: Verbal Expression: 6-Expresses complex  ideas:  With extra time/assistive device  Social Interaction Social Interaction: 6-Interacts appropriately with others with medication or extra time (anti-anxiety, antidepressant).  Problem Solving Problem Solving: 5-Solves complex 90% of the time/cues < 10% of the time  Memory Memory: 5-Recognizes or recalls 90% of the time/requires cueing < 10% of the time   Medical Problem List and Plan:  1. Functional deficits secondary to L2 compression fracture/radiculopathy status post fusion and arthrodesis 6/4. Lumbar corset when out of bed  2. DVT Prophylaxis/Anticoagulation:eliquis resumed 3. Pain Management: Hydrocodone and Robaxin as needed. Monitor with increased mobility , may have L2 radic as cause of LLQ pain, consider gabapentin 4. History CVA 2013. eliquis resumed 5. Neuropsych: This patient is capable of making decisions on his own behalf.  6. Hypertension/atrial fibrillation. Amiodarone 100 mg once per day Saturday Sunday and 200 mg all other days, Lopressor 12.5 mg twice a day, hydrochlorothiazide 12.5 mg daily, lisinopril 10 mg daily.    7. BPH. Proscar 5 mg daily and Flomax 0.4 mg daily.  PVRs x3 need to be done--still emptying frequently 8. Hyperlipidemia. Zocor  9. GERD. Protonix  10.  Consipation neg abd exam, adjust bowel meds  LOS (Days) 5 A FACE TO FACE EVALUATION WAS PERFORMED  Erick ColaceKIRSTEINS,ANDREW E 10/22/2013 8:55 AM

## 2013-10-23 ENCOUNTER — Ambulatory Visit (HOSPITAL_COMMUNITY): Payer: Medicare Other | Admitting: *Deleted

## 2013-10-23 ENCOUNTER — Inpatient Hospital Stay (HOSPITAL_COMMUNITY): Payer: Medicare Other

## 2013-10-23 ENCOUNTER — Inpatient Hospital Stay (HOSPITAL_COMMUNITY): Payer: Medicare Other | Admitting: Physical Therapy

## 2013-10-23 DIAGNOSIS — S32009A Unspecified fracture of unspecified lumbar vertebra, initial encounter for closed fracture: Secondary | ICD-10-CM

## 2013-10-23 DIAGNOSIS — I4891 Unspecified atrial fibrillation: Secondary | ICD-10-CM

## 2013-10-23 DIAGNOSIS — R5381 Other malaise: Secondary | ICD-10-CM

## 2013-10-23 DIAGNOSIS — I10 Essential (primary) hypertension: Secondary | ICD-10-CM

## 2013-10-23 NOTE — Progress Notes (Signed)
Occupational Therapy Session Note  Patient Details  Name: Mark Joseph MRN: [id number] Date of Birth: 04/04/1922  Today's Date: 10/23/2013 Time: 0800-0858 Time Calculation (min): 58 min  Short Term Goals: Week 1:  OT Short Term Goal 1 (Week 1): STG=LTG  Skilled Therapeutic Interventions/Progress Updates:  ADL-retraining with focus on bed mobility, sit<>stand, transfers (to shower chair), and adherence to back precautions.   Patient received supine in bed although admitting to having eaten his breakfast while sitting at edge of bed without use of back brace.   Patient stated he could not find his brace prior to eating.   OT recovered brace from w/c seat and assisted patient with donning brace.   MD arrived and reviewed patient complaint of difficulty swallowing (possible swollen lymph node) and also advised use of brace to ambulate to shower chair, ok to remove once seated and stable (maintaining back precautions).   Patient completed mobility and shower with only min assist but required mod assist to transfer in shower due to LE weakness.    Patient dressed at edge of bed but required mod assist for LB dressing due to incoordination with use of AE and limited time to practice.     Therapy Documentation Precautions:  Precautions Precautions: Back;Fall Required Braces or Orthoses: Spinal Brace Spinal Brace: Lumbar corset;Applied in sitting position Restrictions Weight Bearing Restrictions: No  Vital Signs: Therapy Vitals Temp: 98.7 F (37.1 C) Temp src: Oral Pulse Rate: 65 Resp: 18 BP: 160/71 mmHg Patient Position (if appropriate): Lying Oxygen Therapy SpO2: 92 % O2 Device: None (Room air)  Pain: Pain Assessment Pain Assessment: No/denies pain  See FIM for current functional status  Therapy/Group: Individual Therapy  Second session: Time: 1000-1045 Time Calculation (min): 45 min  Pain Assessment: No pain  Skilled Therapeutic Interventions: ADL-retraining (30 min) with  focus on use of AE for LB dressing.   Patient completed doffing/donning socks and shoes with min assist while sitting at edge of raised mat.   Patient required re-training on use of sock aid but was able to complete task this session with improved support from mat versus edge of bed.   Therex (15 min) with focus on isolated LE strengthening using NuStep, level 2-3, 10 min, rate of 50-60 steps/min and min verbal cues to attend to LLE to maintain contact with sole plate of device.   Patient ambulated back to his room from gym (approx 150') with facilitation to improve posture while ambulating.   See FIM for current functional status  Therapy/Group: Individual Therapy  Domonic Hiscox 10/23/2013, 8:59 AM

## 2013-10-23 NOTE — Progress Notes (Signed)
Physical Therapy Note  Patient Details  Name: Mark Joseph MRN: [id number] Date of Birth: 08/14/1921 Today's Date: 10/23/2013  Car transfer and ramp negotiation goals added at supervision-min A level.   Edman CircleHall, Alyssandra Hulsebus Faucette 10/23/2013, 1:01 PM

## 2013-10-23 NOTE — Progress Notes (Signed)
Physical Therapy Note  Patient Details  Name: Mark Joseph MRN: [id number] Date of Birth: 06/22/1921 Today's Date: 10/23/2013 1300-1400, 60 min group  Pain low back 5/10; premedicated LSO; back precautions  Stride Right group for gait training, LE ex.  Gait x 87', 150' with RW, min guard once standing, focusing on upright posture and wider BOS.  From w/c, sit> stand with min guard assist using bil armrests; sit>< stand from 23" high mat, mod assist, poor eccentric control with LEs; .  Pt able to state 3/3 back precautions. High level gait training with RW, x 5' to L before pt stating he was SOB, and needed to sit quickly. He recovered quickly with seated rest.  Therapeutic exercise performed with LEs to increase strength for functional mobility : bil hip abd against orange Theraband.  W/c propulsion using bil LEs to propel x 150' with supervision.  Nivin Braniff 10/23/2013, 4:34 PM

## 2013-10-23 NOTE — Progress Notes (Signed)
Physical Therapy Session Note  Patient Details  Name: Mark Joseph MRN: [id number] Date of Birth: 03/24/1922  Today's Date: 10/23/2013 Time: 1130-1200 Time Calculation (min): 30 min  Short Term Goals: Week 1:  PT Short Term Goal 1 (Week 1): Pt will perform bed mobility with min A PT Short Term Goal 2 (Week 1): Pt will perform functional transfers with min A  Skilled Therapeutic Interventions/Progress Updates:   Pt resting in recliner with wife present.  Wife reporting pt is fatigued from busy am of therapy.  Pt willing to participate.  Discussed with pt and wife set up for home entry/exit, equipment (pt has  at home and car transfers.  Wife reports they have a ramp with rails for home entry/exit but does have one little step to enter house.  Pt performed stand pivot recliner > w/c mod-max A to stand from low recliner secondary to poor ability to translate COG anterior over BOS with back precautions; also required verbal cues for safe sequencing with RW.  Performed ramp negotiation training with RW and mod A for balance and max-total verbal cues for sequencing and safety with RW on ramp.  Attempted one step negotiation with RW but pt unable to lift RW off of ramp to place on ground safely.  Will attempt tomorrow.  Performed w/c <> car transfer stand pivot with RW with mod-max A for sit <> stand from w/c and car but supervision for placing LE into and out of car.  Discussed ELOS (based on evaluation) with wife; wife feels that would be too soon considering the amount of physical assistance the pt needs.)  Will discuss with OT and team tomorrow at conference.  Pt returned to room to eat lunch in w/c.   Therapy Documentation Precautions:  Precautions Precautions: Back;Fall Required Braces or Orthoses: Spinal Brace Spinal Brace: Lumbar corset;Applied in sitting position Restrictions Weight Bearing Restrictions: No Vital Signs: Therapy Vitals Pulse Rate: 82 BP: 133/81 mmHg Pain: Pain  Assessment Pain Assessment: No/denies pain Locomotion : Ambulation Ambulation/Gait Assistance: 3: Mod assist Wheelchair Mobility Distance: 150   See FIM for current functional status  Therapy/Group: Individual Therapy  Edman CircleHall, Audra Roy A Himelfarb Surgery CenterFaucette 10/23/2013, 12:46 PM

## 2013-10-23 NOTE — Progress Notes (Signed)
[place] PHYSICAL MEDICINE & REHABILITATION     PROGRESS NOTE    Subjective/Complaints: Still with LLQ pain. Moved bowels twice yesterday. Right neck very sore. Feels nodule which has been there PTA. Very difficult to swallow now. Review of Systems - Negative except as above  Objective: Vital Signs: Blood pressure 160/71, pulse 65, temperature 98.7 F (37.1 C), temperature source Oral, resp. rate 18, height 5\' 8"  (1.727 m), weight 74 kg (163 lb 2.3 oz), SpO2 92.00%. No results found. No results found for this basename: WBC, HGB, HCT, PLT,  in the last 72 hours No results found for this basename: NA, K, CL, CO, GLUCOSE, BUN, CREATININE, CALCIUM,  in the last 72 hours CBG (last 3)  No results found for this basename: GLUCAP,  in the last 72 hours  Wt Readings from Last 3 Encounters:  10/18/13 74 kg (163 lb 2.3 oz)  10/06/13 67.586 kg (149 lb)  10/06/13 67.586 kg (149 lb)    Physical Exam:  Constitutional: He is oriented to person, place, and time. He appears well-developed.  HENT:  Head: Normocephalic.  Eyes: EOM are normal.  Neck: Normal range of motion. Soft marble size nodule just above right thyroid--tender to touch. Cardiovascular:  Cardiac rate controlled. No murmurs, rubs, gallops  Respiratory: Effort normal and breath sounds normal. No respiratory distress. No wheezes  GI: Soft. Bowel sounds are normal. He exhibits no distension. No tenderness Neurological: He is alert and oriented to person, place, and time.  UE's grossly 4/5 proximal to distal. RLE: 2+ RHF, 2- RKE, 4/5 R ankles. LLE: 3+ HF, 3 KE and 4/5 ankles. No gross sensory deficits . Proximal movement limited by pain.  Skin:  Flank incision clean. No skin breakdown on buttock/low back Psychiatric: He has a normal mood and affect. His behavior is normal. Thought content normal    Assessment/Plan: 1. Functional deficits secondary to L2 compression fx, right L2 radiculopathy which require 3+ hours per day of  interdisciplinary therapy in a comprehensive inpatient rehab setting. Physiatrist is providing close team supervision and 24 hour management of active medical problems listed below. Physiatrist and rehab team continue to assess barriers to discharge/monitor patient progress toward functional and medical goals. FIM: FIM - Bathing Bathing Steps Patient Completed: Chest;Right Arm;Left Arm;Abdomen;Front perineal area;Buttocks Bathing: 4: Min-Patient completes 8-9 2053f 10 parts or 75+ percent  FIM - Upper Body Dressing/Undressing Upper body dressing/undressing steps patient completed: Thread/unthread right sleeve of pullover shirt/dresss;Thread/unthread left sleeve of pullover shirt/dress;Put head through opening of pull over shirt/dress;Pull shirt over trunk Upper body dressing/undressing: 5: Set-up assist to: Apply TLSO, cervical collar FIM - Lower Body Dressing/Undressing Lower body dressing/undressing steps patient completed: Thread/unthread right underwear leg;Thread/unthread left underwear leg;Thread/unthread right pants leg;Thread/unthread left pants leg;Pull underwear up/down Lower body dressing/undressing: 2: Max-Patient completed 25-49% of tasks  FIM - Toileting Toileting steps completed by patient: Adjust clothing prior to toileting Toileting Assistive Devices: Grab bar or rail for support Toileting: 2: Max-Patient completed 1 of 3 steps  FIM - ArchivistToilet Transfers Toilet Transfers: 0-Activity did not occur  FIM - BankerBed/Chair Transfer Bed/Chair Transfer Assistive Devices: Environmental consultantWalker;Arm rests Bed/Chair Transfer: 2: Chair or W/C > Bed: Max A (lift and lower assist);2: Bed > Chair or W/C: Max A (lift and lower assist)  FIM - Locomotion: Wheelchair Locomotion: Wheelchair: 0: Activity did not occur FIM - Locomotion: Ambulation Locomotion: Ambulation Assistive Devices: Walker - Rolling Locomotion: Ambulation: 1: Travels less than 50 ft with moderate assistance (Pt: 50 - 74%) (10  feet)  Comprehension Comprehension Mode: Auditory Comprehension: 5-Understands complex 90% of the time/Cues < 10% of the time  Expression Expression Mode: Verbal Expression: 6-Expresses complex ideas: With extra time/assistive device  Social Interaction Social Interaction: 6-Interacts appropriately with others with medication or extra time (anti-anxiety, antidepressant).  Problem Solving Problem Solving: 5-Solves complex 90% of the time/cues < 10% of the time  Memory Memory: 5-Recognizes or recalls 90% of the time/requires cueing < 10% of the time   Medical Problem List and Plan:  1. Functional deficits secondary to L2 compression fracture/radiculopathy status post fusion and arthrodesis 6/4. Lumbar corset when out of bed  2. DVT Prophylaxis/Anticoagulation:eliquis resumed 3. Pain Management: Hydrocodone and Robaxin as needed. Still thing this is referred pain from operation/incision. 4. History CVA 2013. eliquis resumed 5. Neuropsych: This patient is capable of making decisions on his own behalf.  6. Hypertension/atrial fibrillation. Amiodarone 100 mg once per day Saturday Sunday and 200 mg all other days, Lopressor 12.5 mg twice a day, hydrochlorothiazide 12.5 mg daily, lisinopril 10 mg daily.    7. BPH. Proscar 5 mg daily and Flomax 0.4 mg daily.  PVRs x3 need to be done--still emptying frequently 8. Hyperlipidemia. Zocor  9. GERD. Protonix  10.  Constipation: moved bowels twice yesterday  LOS (Days) 6 A FACE TO FACE EVALUATION WAS PERFORMED  Duell Holdren T 10/23/2013 8:47 AM

## 2013-10-23 NOTE — Discharge Instructions (Addendum)
Information on my medicine - ELIQUIS (apixaban)  This medication education was reviewed with me or my healthcare representative as part of my discharge preparation.  The pharmacist that spoke with me during my hospital stay was:  Clyda HurdleJeremy So, PharmD  Why was Eliquis prescribed for you? Eliquis was prescribed for you to reduce the risk of a blood clot forming that can cause a stroke if you have a medical condition called atrial fibrillation (a type of irregular heartbeat).  What do You need to know about Eliquis ? Take your Eliquis TWICE DAILY - one tablet in the morning and one tablet in the evening with or without food. If you have difficulty swallowing the tablet whole please discuss with your pharmacist how to take the medication safely.  Take Eliquis exactly as prescribed by your doctor and DO NOT stop taking Eliquis without talking to the doctor who prescribed the medication.  Stopping may increase your risk of developing a stroke.  Refill your prescription before you run out.  After discharge, you should have regular check-up appointments with your healthcare provider that is prescribing your Eliquis.  In the future your dose may need to be changed if your kidney function or weight changes by a significant amount or as you get older.  What do you do if you miss a dose? If you miss a dose, take it as soon as you remember on the same day and resume taking twice daily.  Do not take more than one dose of ELIQUIS at the same time to make up a missed dose.  Important Safety Information A possible side effect of Eliquis is bleeding. You should call your healthcare provider right away if you experience any of the following:   Bleeding from an injury or your nose that does not stop.   Unusual colored urine (red or dark brown) or unusual colored stools (red or black).   Unusual bruising for unknown reasons.   A serious fall or if you hit your head (even if there is no bleeding).  Some  medicines may interact with Eliquis and might increase your risk of bleeding or clotting while on Eliquis. To help avoid this, consult your healthcare provider or pharmacist prior to using any new prescription or non-prescription medications, including herbals, vitamins, non-steroidal anti-inflammatory drugs (NSAIDs) and supplements.  This website has more information on Eliquis (apixaban): [email or web address] Discharge Instructions  Mark CroonLeon A Joseph Discharge date and time: No discharge date for patient encounter.   Activities/Precautions/ Functional Status: Activity: Lumbar corset when out of bed Diet: regular diet Wound Care: keep wound clean and dry Functional status:  ___ No restrictions     ___ Walk up steps independently ___ 24/7 supervision/assistance   ___ Walk up steps with assistance ___ Intermittent supervision/assistance  ___ Bathe/dress independently ___ Walk with walker     ___ Bathe/dress with assistance ___ Walk Independently    ___ Shower independently _x__ Walk with assistance    ___ Shower with assistance ___ No alcohol     ___ Return to work/school ________  Special Instructions:    My questions have been answered and I understand these instructions. I will adhere to these goals and the provided educational materials after my discharge from the hospital.  Patient/Caregiver Signature _______________________________ Date __________  Clinician Signature _______________________________________ Date __________  Please bring this form and your medication list with you to all your follow-up doctor's appointments.

## 2013-10-24 ENCOUNTER — Encounter (HOSPITAL_COMMUNITY): Payer: Medicare Other

## 2013-10-24 ENCOUNTER — Inpatient Hospital Stay (HOSPITAL_COMMUNITY): Payer: Medicare Other

## 2013-10-24 ENCOUNTER — Inpatient Hospital Stay (HOSPITAL_COMMUNITY): Payer: Medicare Other | Admitting: Physical Therapy

## 2013-10-24 LAB — BASIC METABOLIC PANEL
BUN: 27 mg/dL — ABNORMAL HIGH (ref 6–23)
CO2: 27 mEq/L (ref 19–32)
Calcium: 8.8 mg/dL (ref 8.4–10.5)
Chloride: 91 mEq/L — ABNORMAL LOW (ref 96–112)
Creatinine, Ser: 1.09 mg/dL (ref 0.50–1.35)
GFR calc Af Amer: 67 mL/min — ABNORMAL LOW (ref >90)
GFR calc non Af Amer: 58 mL/min — ABNORMAL LOW (ref >90)
Glucose, Bld: 112 mg/dL — ABNORMAL HIGH (ref 70–99)
POTASSIUM: 4.7 meq/L (ref 3.7–5.3)
SODIUM: 133 meq/L — AB (ref 137–147)

## 2013-10-24 LAB — CBC
HCT: 24.9 % — ABNORMAL LOW (ref 39.0–52.0)
HEMOGLOBIN: 8.7 g/dL — AB (ref 13.0–17.0)
MCH: 32.3 pg (ref 26.0–34.0)
MCHC: 34.9 g/dL (ref 30.0–36.0)
MCV: 92.6 fL (ref 78.0–100.0)
Platelets: 373 10*3/uL (ref 150–400)
RBC: 2.69 MIL/uL — ABNORMAL LOW (ref 4.22–5.81)
RDW: 14.2 % (ref 11.5–15.5)
WBC: 9.1 10*3/uL (ref 4.0–10.5)

## 2013-10-24 LAB — URINALYSIS, ROUTINE W REFLEX MICROSCOPIC
Bilirubin Urine: NEGATIVE
Glucose, UA: NEGATIVE mg/dL
HGB URINE DIPSTICK: NEGATIVE
Ketones, ur: 15 mg/dL — AB
NITRITE: NEGATIVE
Protein, ur: 30 mg/dL — AB
SPECIFIC GRAVITY, URINE: 1.02 (ref 1.005–1.030)
UROBILINOGEN UA: 1 mg/dL (ref 0.0–1.0)
pH: 7.5 (ref 5.0–8.0)

## 2013-10-24 LAB — URINE MICROSCOPIC-ADD ON

## 2013-10-24 MED ORDER — DICLOFENAC SODIUM 1 % TD GEL
2.0000 g | Freq: Four times a day (QID) | TRANSDERMAL | Status: DC
  Administered 2013-10-24 – 2013-11-02 (×21): 2 g via TOPICAL
  Filled 2013-10-24: qty 100

## 2013-10-24 MED ORDER — HYDROCODONE-ACETAMINOPHEN 5-325 MG PO TABS
1.0000 | ORAL_TABLET | ORAL | Status: DC | PRN
  Administered 2013-10-25 – 2013-10-26 (×2): 1 via ORAL
  Filled 2013-10-24 (×3): qty 1

## 2013-10-24 NOTE — Progress Notes (Signed)
Occupational Therapy Session Note  Patient Details  Name: Mark CroonLeon A Joseph MRN: [id number] Date of Birth: 12/30/1921  Today's Date: 10/24/2013 Time: [id number] Time Calculation (min): 43 min  Short Term Goals: Week 1:  OT Short Term Goal 1 (Week 1): STG=LTG  Skilled Therapeutic Interventions/Progress Updates:    Pt seen for individual OT treatment session this morning w/ focus on sit to stand transfers, static/dynamic standing balance for short periods of time. Pt reports that he is feeling fatigued today, but was agreeable to going down to therapy gym. Pt was assisted w/ sit to stand from w/c & required Mod A w/ vc's for placement of hands and feet as well as to "stand tall" due to back precautions. Pt stood w/ Min A for ~2-3 min then requested to sit back in w/c. After a rest break, pt stood at table while reaching w/ right hand to place yellow clothes pins on dowel bar. Again, pt required increased vc's and tactile cues to maintain proper positioning and assist for standing/balance min-mod A. Back precautions were reviewed w/ pt verbally. Pt reported fatigue and feeling "light headed" after this so BP was assessed 132/74 HR 71 and O2 SATs on room air 96%. Pt's RN was made aware of pt c/o and was going to give medications, pts wife is concerned with the look of pt's urine in catheter bag, this was also reported to RN. Pt stood statically x2 more times for ~2 min each as he reports LE weakness and overall fatigue. Pt was sitting up in w/c w/ call bell and phone in reach in his room.  Therapy Documentation Precautions:  Precautions Precautions: Back;Fall Required Braces or Orthoses: Spinal Brace Spinal Brace: Lumbar corset;Applied in sitting position Restrictions Weight Bearing Restrictions: No General: General Amount of Missed OT Time (min): 11 Minutes Vital Signs:   Pain: Pain Assessment Pain Assessment: No/denies pain ADL:     See FIM for current functional status  Therapy/Group:  Individual Therapy  Alm BustardBarnhill, Amy Beth Dixon 10/24/2013, 10:20 AM

## 2013-10-24 NOTE — Progress Notes (Signed)
[place] PHYSICAL MEDICINE & REHABILITATION     PROGRESS NOTE    Subjective/Complaints: No new issues. Still with left lower abd/flank pain Review of Systems - Negative except as above  Objective: Vital Signs: Blood pressure 159/89, pulse 61, temperature 97.9 F (36.6 C), temperature source Oral, resp. rate 18, height 5\' 8"  (1.727 m), weight 74 kg (163 lb 2.3 oz), SpO2 90.00%. [place] Soft Tissue Head/neck  10/23/2013   CLINICAL DATA:  Right neck nodule/pain  EXAM: ULTRASOUND OF HEAD/NECK SOFT TISSUES  TECHNIQUE: Ultrasound examination of the head and neck soft tissues was performed in the area of clinical concern.  COMPARISON:  None.  FINDINGS: No adenopathy, mass, cyst, abscess, or other focal lesion is identified in the region of concern. The carotid bifurcation lies deep to the region of concern.  IMPRESSION: No ultrasound pathology in region of concern.   Electronically Signed   By: Oley Balmaniel  Hassell M.D.   On: 10/23/2013 16:01   No results found for this basename: WBC, HGB, HCT, PLT,  in the last 72 hours No results found for this basename: NA, K, CL, CO, GLUCOSE, BUN, CREATININE, CALCIUM,  in the last 72 hours CBG (last 3)  No results found for this basename: GLUCAP,  in the last 72 hours  Wt Readings from Last 3 Encounters:  10/18/13 74 kg (163 lb 2.3 oz)  10/06/13 67.586 kg (149 lb)  10/06/13 67.586 kg (149 lb)    Physical Exam:  Constitutional: He is oriented to person, place, and time. He appears well-developed.  HENT:  Head: Normocephalic.  Eyes: EOM are normal.  Neck: Normal range of motion. Soft, mobile nodule just above right thyroid--which is still tender Cardiovascular:  Cardiac rate controlled. No murmurs, rubs, gallops  Respiratory: Effort normal and breath sounds normal. No respiratory distress. No wheezes  GI: Soft. Bowel sounds are normal. He exhibits no distension. No tenderness Neurological: He is alert and oriented to person, place, and time.  UE's grossly  4/5 proximal to distal. RLE: 2+ RHF, 2- RKE, 4/5 R ankles. LLE: 3+ HF, 3 KE and 4/5 ankles. No gross sensory deficits . Proximal movement limited by pain.  Skin:  Flank incision clean. No skin breakdown on buttock/low back Psychiatric: He has a normal mood and affect. His behavior is normal. Thought content normal    Assessment/Plan: 1. Functional deficits secondary to L2 compression fx, right L2 radiculopathy which require 3+ hours per day of interdisciplinary therapy in a comprehensive inpatient rehab setting. Physiatrist is providing close team supervision and 24 hour management of active medical problems listed below. Physiatrist and rehab team continue to assess barriers to discharge/monitor patient progress toward functional and medical goals. FIM: FIM - Bathing Bathing Steps Patient Completed: Chest;Right Arm;Left Arm;Abdomen;Front perineal area;Right upper leg;Left upper leg;Right lower leg (including foot);Left lower leg (including foot) Bathing: 4: Min-Patient completes 8-9 1034f 10 parts or 75+ percent  FIM - Upper Body Dressing/Undressing Upper body dressing/undressing steps patient completed: Thread/unthread right sleeve of pullover shirt/dresss;Thread/unthread left sleeve of pullover shirt/dress;Put head through opening of pull over shirt/dress;Pull shirt over trunk Upper body dressing/undressing: 4: Min-Patient completed 75 plus % of tasks FIM - Lower Body Dressing/Undressing Lower body dressing/undressing steps patient completed: Thread/unthread left pants leg;Thread/unthread left underwear leg;Pull underwear up/down;Pull pants up/down Lower body dressing/undressing: 2: Max-Patient completed 25-49% of tasks  FIM - Toileting Toileting steps completed by patient: Adjust clothing prior to toileting Toileting Assistive Devices: Grab bar or rail for support Toileting: 2: Max-Patient completed 1 of  3 steps  FIM - ArchivistToilet Transfers Toilet Transfers: 0-Activity did not occur  FIM -  BankerBed/Chair Transfer Bed/Chair Transfer Assistive Devices: Walker;Arm rests;Orthosis Bed/Chair Transfer: 3: Bed > Chair or W/C: Mod A (lift or lower assist);3: Chair or W/C > Bed: Mod A (lift or lower assist)  FIM - Locomotion: Wheelchair Distance: 150 Locomotion: Wheelchair: 1: Total Assistance/staff pushes wheelchair (Pt<25%) FIM - Locomotion: Ambulation Locomotion: Ambulation Assistive Devices: Designer, industrial/productWalker - Rolling Ambulation/Gait Assistance: 3: Mod assist Locomotion: Ambulation: 1: Travels less than 50 ft with moderate assistance (Pt: 50 - 74%)  Comprehension Comprehension Mode: Auditory Comprehension: 4-Understands basic 75 - 89% of the time/requires cueing 10 - 24% of the time  Expression Expression Mode: Verbal Expression: 5-Expresses complex 90% of the time/cues < 10% of the time  Social Interaction Social Interaction: 5-Interacts appropriately 90% of the time - Needs monitoring or encouragement for participation or interaction.  Problem Solving Problem Solving: 5-Solves basic 90% of the time/requires cueing < 10% of the time  Memory Memory: 5-Recognizes or recalls 90% of the time/requires cueing < 10% of the time   Medical Problem List and Plan:  1. Functional deficits secondary to L2 compression fracture/radiculopathy status post fusion and arthrodesis 6/4. Lumbar corset when out of bed  2. DVT Prophylaxis/Anticoagulation:eliquis resumed 3. Pain Management: Hydrocodone and Robaxin as needed. Still thing this is referred pain from operation/incision. 4. History CVA 2013. eliquis resumed 5. Neuropsych: This patient is capable of making decisions on his own behalf.  6. Hypertension/atrial fibrillation. Amiodarone 100 mg once per day Saturday Sunday and 200 mg all other days, Lopressor 12.5 mg twice a day, hydrochlorothiazide 12.5 mg daily, lisinopril 10 mg daily.    7. BPH. Proscar 5 mg daily and Flomax 0.4 mg daily.    8. Hyperlipidemia. Zocor  9. GERD. Protonix  10.   Constipation: continue current regimen 11. Right neck nodule/pain---likely lymph node. No thyroid abnl or other pathology noted on u/s  LOS (Days) 7 A FACE TO FACE EVALUATION WAS PERFORMED  SWARTZ,ZACHARY T 10/24/2013 8:25 AM

## 2013-10-24 NOTE — Progress Notes (Signed)
Occupational Therapy Session Note  Patient Details  Name: Mark Joseph MRN: [id number] Date of Birth: 02/28/1922  Today's Date: 10/24/2013 Time: [id number] Time Calculation (min): 49 min  Short Term Goals: Week 1:  OT Short Term Goal 1 (Week 1): STG=LTG  Skilled Therapeutic Interventions/Progress Updates:  ADL-retraining with focus on improved sit<>stand, transfers, safety awareness, and effective use of AE during ADL.   Patient received in his w/c completing self-feeding.  Patient demo'd improved performance with sit<>stand for mobility to bathroom using RW and requires min vc for technique with only steadying assist for safety.  Patient requires setup assist to don/doff back brace and min vc to not stand with brace off or attempt washing his feet using forward leans while seated on shower seat.  Patient continues to practice use of AE (long sponge) but requires vc to problem-solve when challenged d/t incoordination.  Patient is able to use AE to don LB clothing but is unable to accomplish all dressing tasks within limited timeframe of treatment (60 min).   OT demo'd use of toilet aid this session d/t complaint of inadequate thoroughness during assisted toileting.     Therapy Documentation Precautions:  Precautions Precautions: Back;Fall Required Braces or Orthoses: Spinal Brace Spinal Brace: Lumbar corset;Applied in sitting position Restrictions Weight Bearing Restrictions: No  General: General Amount of Missed OT Time (min): 11 Minutes  Vital Signs: Therapy Vitals Temp: 97.9 F (36.6 C) Temp src: Oral Pulse Rate: 61 Resp: 18 BP: 159/89 mmHg Patient Position (if appropriate): Lying Oxygen Therapy SpO2: 90 % O2 Device: None (Room air)  Pain: Pain Assessment Pain Assessment: No/denies pain  See FIM for current functional status  Therapy/Group: Individual Therapy  Second session: Time: 1130-1200 Time Calculation (min): 30 min  Pain Assessment: 2/10, right hip    Skilled Therapeutic Interventions: Therapeutic activity with focus on improved LE strength in prep for sit<>stand and transfers.   Patient completed NuStep X10 min, level 3, 600 steps total performed with setup assist to secure feet in foot-plates.  Following NuStep patient performed sit<>from raised mat, 5 times, with min assist to stabilize right hip and knee and manual facilitation to initiate standing using only legs and verbal cues to correct postural alignment.  Patient completed tasks with good attention although reiterating that right hip pain inhibits his performance somewhat.  Patient ambulated back to his room usiing RW and supervision with min vc for posture.   Patient demo'd appropriate pace and stride length without evidence of SOB or LOB.  See FIM for current functional status  Therapy/Group: Individual Therapy  Mark Joseph 10/24/2013, 8:52 AM

## 2013-10-24 NOTE — Progress Notes (Signed)
Physical Therapy Session Note  Patient Details  Name: Mark CroonLeon A Paz MRN: [id number] Date of Birth: 09/26/1921  Today's Date: 10/24/2013 Time: 13:05-13:35 (30min) and 14:05-14:35 (30min)  Short Term Goals: Week 1:  PT Short Term Goal 1 (Week 1): Pt will perform bed mobility with min A PT Short Term Goal 2 (Week 1): Pt will perform functional transfers with min A  Skilled Therapeutic Interventions/Progress Updates:  Pt very fatigued, falling asleep sitting up in WC. Perfoermed bedisde tx, and helped pt back to bed to nap before completing afternoon's schedule. Pt c/o R hip and back pain this afternoon, but not particularly wanting pain meds, 4/10 with activity and 0 at rest. Wife concerned with new hip pain since surgery.   Mod A WC>Bed with RW and Max A sit>supine with decreased ability to adhere to back precautions with cues.   Supine therex for strengthening and stretching: heel slides, hip ABD/ADD, and SAQ with 3 sec holds. Pt had most difficulty with L hip ABD and SAQ on RLE, unable to hold >2 sec once fatigued.  Performed passive heel cord and HS stretching 2x641min for functional joint mobility.  Pt left to rest, and paged PA as pt not recall MD visit this morning, and wife with questions about US yesterday.   __________________________________________    Pt had nap, and feeling better, despite continual pain. Pt not wanting pain meds due to side effects, so provided hot pack for R proximal thigh at end of tx. Tx focused on safety with functional mobility with in back precautions.   Supine>sit logrolling with trunk lifting assist and cues for technique.   Continued stretching of HS and heel cord in sitting, which was uncomfortable for pt due to tightness.  Performed sit<>stands from bed><RW with up to max lifting assist. Pt unable to safety perform anterior translation of COG over BOS, pressing with backs of LEs against bed to accomplish movement.   Pt able to stand 3x91min with  encouragement. BP demonstrated orthostatic changes, but not symptomatic. See flowsheet for details >40 mmHg drop SPB.   Performed marching and static standing balance before pt wanting to return back to bed to rest.  Pt left up in bed with all needs in reach and wife present.   Therapy Documentation Precautions:  Precautions Precautions: Back;Fall Required Braces or Orthoses: Spinal Brace Spinal Brace: Lumbar corset;Applied in sitting position Restrictions Weight Bearing Restrictions: No    Pain: 4/10 back and R hip pain    See FIM for current functional status  Therapy/Group: Individual Therapy Clydene Lamingole Shaheer Bonfield, PT, DPT   10/24/2013, 1:28 PM

## 2013-10-24 NOTE — Patient Care Conference (Signed)
Inpatient RehabilitationTeam Conference and Plan of Care Update Date: 10/24/2013   Time: 2:45 PM    Patient Name: Mark Joseph      Medical Record Number: [id number]  Date of Birth: 06/18/1921 Sex: Male         Room/Bed: 4M05C/4M05C-01 Payor Info: Payor: [employer] [place] [employer]ICARE / Plan: BLUE MEDICARE / Product Type: *No Product type* /    Admitting Diagnosis: LUMBAR FX  Admit Date/Time:  10/17/2013  4:35 PM Admission Comments: No comment available   Primary Diagnosis:  <principal problem not specified> Principal Problem: <principal problem not specified>  Patient Active Problem List   Diagnosis Date Noted  . Lumbar vertebral fracture 10/17/2013  . Fracture of lumbar spine 10/12/2013  . Compression fracture of L2 lumbar vertebra 10/06/2013  . Physical deconditioning 07/27/2013  . Atrial fibrillation with RVR 07/22/2013  . UTI (urinary tract infection) 10/12/2012  . S/P bilateral inguinal hernia repair 10/03/2012  . Leukocytosis 10/03/2012  . Hematoma 10/03/2012  . CVA (cerebral infarction) 01/30/2012  . Hypertension 01/30/2012    Expected Discharge Date: Expected Discharge Date: 11/02/13  Team Members Present: Physician leading conference: Dr. Faith RogueZachary Swartz Social Worker Present: Amada JupiterLucy Hoyle, LCSW Nurse Present: Carlean PurlMaryann Barbour, RN PT Present: Edman CircleAudra Hall, PT OT Present: Pleas Patriciahristina Shaffer, OT;Jennifer Katrinka BlazingSmith, OT;Patricia Mat Carnelay, OT SLP Present: Feliberto Gottronourtney Payne, SLP Other (Discipline and Name): Ottie GlazierBarbara Boyette, RN PPS Coordinator present : Tora DuckMarie Noel, RN, CRRN;Becky Henrene DodgeWind[place], PT     Current Status/Progress Goal Weekly Team Focus  Medical   l2 fx s/p surgical fusion/decompression, having pain, fatigue  improve activity tolerance,   pain, fatigue, foley   Bowel/Bladder   Foley in place for chronic urinary retention, incontent of loose stools  continent of bowel with min assist  Remain continent of bowel with min assist and min assist with foley   Swallow/Nutrition/  Hydration             ADL's   Min A for ADL (extra time needed), Min-Mod for transfer (sit<>stand), setup assist with orthotics  min A overall  General endurance, transfers, adherence to back precautions, general strengthening   Mobility   Mod-max A overall; limited by pain and limited activity tolerance/endurance  supervision overall  activity tolerance, balance, transfers, gait.     Communication             Safety/Cognition/ Behavioral Observations            Pain   New onset of R hip pain  <3 on a 0-10 scale  Monitor pain q 4hr. and reassess after each pain medication intervention   Skin   No skin breakdown  Remain free of skin breakdown and infection  assess skin q shift    Rehab Goals Patient on target to meet rehab goals: No Rehab Goals Revised: poor tolerance currently and may need to downgrade some goals *See Care Plan and progress notes for long and short-term goals.  Barriers to Discharge: poor activity tolerance    Possible Resolutions to Barriers:  medical work up. stretching, topical rx    Discharge Planning/Teaching Needs:  home with wife to provide 24/7 vs possible SNF       Team Discussion:  Pain is primary complaint but location varies.  Recommend stretching on ride side prior to tx.  Does not tolerate pain meds very well (becomes very sleepy).  Falling asleed a lot.  Will recheck labs to r/o any new issues developing.  OK Goals currently at supervision but  may need to downgrade.  Revisions to Treatment Plan:  None   Continued Need for Acute Rehabilitation Level of Care: The patient requires daily medical management by a physician with specialized training in physical medicine and rehabilitation for the following conditions: Daily direction of a multidisciplinary physical rehabilitation program to ensure safe treatment while eliciting the highest outcome that is of practical value to the patient.: Yes Daily medical management of patient stability for  increased activity during participation in an intensive rehabilitation regime.: Yes Daily analysis of laboratory values and/or radiology reports with any subsequent need for medication adjustment of medical intervention for : Post surgical problems;Neurological problems  HOYLE, LUCY 10/25/2013, 2:12 PM

## 2013-10-25 ENCOUNTER — Encounter (HOSPITAL_COMMUNITY): Payer: Medicare Other

## 2013-10-25 ENCOUNTER — Inpatient Hospital Stay (HOSPITAL_COMMUNITY): Payer: Medicare Other

## 2013-10-25 ENCOUNTER — Inpatient Hospital Stay (HOSPITAL_COMMUNITY): Payer: Medicare Other | Admitting: Physical Therapy

## 2013-10-25 DIAGNOSIS — S32009A Unspecified fracture of unspecified lumbar vertebra, initial encounter for closed fracture: Secondary | ICD-10-CM

## 2013-10-25 DIAGNOSIS — R5381 Other malaise: Secondary | ICD-10-CM

## 2013-10-25 DIAGNOSIS — I4891 Unspecified atrial fibrillation: Secondary | ICD-10-CM

## 2013-10-25 DIAGNOSIS — I10 Essential (primary) hypertension: Secondary | ICD-10-CM

## 2013-10-25 LAB — BASIC METABOLIC PANEL
BUN: 26 mg/dL — ABNORMAL HIGH (ref 6–23)
CO2: 28 mEq/L (ref 19–32)
Calcium: 9 mg/dL (ref 8.4–10.5)
Chloride: 91 mEq/L — ABNORMAL LOW (ref 96–112)
Creatinine, Ser: 1.05 mg/dL (ref 0.50–1.35)
GFR calc Af Amer: 70 mL/min — ABNORMAL LOW (ref >90)
GFR calc non Af Amer: 61 mL/min — ABNORMAL LOW (ref >90)
Glucose, Bld: 122 mg/dL — ABNORMAL HIGH (ref 70–99)
POTASSIUM: 4.5 meq/L (ref 3.7–5.3)
Sodium: 132 mEq/L — ABNORMAL LOW (ref 137–147)

## 2013-10-25 NOTE — Progress Notes (Signed)
Physical Therapy Weekly Progress Note  Patient Details  Name: Mark Joseph MRN: [id number] Date of Birth: 1922-04-05  Beginning of progress report period: October 18, 2013 End of progress report period: October 25, 2013  Today's Date: 10/25/2013 Time: 1015-1100 Time Calculation (min): 45 min  Patient has made slow progress and has met 0 of 2 short term goals.  Pt is currently mod A overall for bed mobility, bed <> chair transfers, gait and one step negotiation secondary to continued pain and weakness.     Patient continues to demonstrate the following deficits: core and bilat LE weakness, impaired activity tolerance/endurance, pain, impaired postural control, balance, gait and therefore will continue to benefit from skilled PT intervention to enhance overall performance with activity tolerance, balance and postural control.  Patient progressing toward long term goals..  Continue plan of care.  PT Short Term Goals Week 1:  PT Short Term Goal 1 (Week 1): Pt will perform bed mobility with min A PT Short Term Goal 1 - Progress (Week 1): Progressing toward goal PT Short Term Goal 2 (Week 1): Pt will perform functional transfers with min A PT Short Term Goal 2 - Progress (Week 1): Progressing toward goal  Skilled Therapeutic Interventions/Progress Updates:   Wife present.  Pt reporting less R hip pain today.  Performed education with patient and wife regarding bed <> chair transfers stand pivot with RW onto 28" bed, bed mobility with use of log rolling for back precautions, donning and doffing of LSO seated at EOB, gait up/down ramp and up/down one step with RW.  Pt currently requires mod A for all tasks and secondary to increased physical assistance right now pt wife only return demonstrated donning/doffing of brace.  Will continue address to reach supervision goals.    Therapy Documentation Precautions:  Precautions Precautions: Back;Fall Required Braces or Orthoses: Spinal Brace Spinal Brace:  Lumbar corset;Applied in sitting position Restrictions Weight Bearing Restrictions: No Pain: Pain Assessment Pain Assessment: No/denies pain Locomotion : Ambulation Ambulation/Gait Assistance: 3: Mod assist Wheelchair Mobility Distance: 150   See FIM for current functional status  Therapy/Group: Individual Therapy  Raylene Everts [facility] 10/25/2013, 12:49 PM

## 2013-10-25 NOTE — Progress Notes (Signed)
Occupational Therapy Weekly Progress Note  Patient Details  Name: Mark Joseph MRN: [id number] Date of Birth: 12/02/1921  Today's Date: 10/25/2013 Time: [id number] Time Calculation (min): 41 min  Patient continues to demonstrate the following deficits: Generalized weakness, impaired transfers, pain, impaired functional mobility, and decreased independence with BADL and therefore will continue to benefit from skilled OT intervention to enhance overall performance with BADL.  Patient progressing toward long term goals..  Continue plan of care.  OT Short Term Goals Week 1:  OT Short Term Goal 1 (Week 1): STG=LTG  Skilled Therapeutic Interventions/Progress Updates:  ADL-retraining with focus on toileting, toilet transfers, and bed mobility.   Patient received seated in recliner and reporting need for BM.  [facility] with min assist to rise to RW and standby assist during mobility to manage catheter bag.    Patient completed BM after remaining seated on toilet for 7 min, and required max assist to manage clothing and complete hygiene due to report of fatigue from previous therapies.   Patient had large BM and reported exhaustion from effort, requiring bed rest.   Patient was escorted to bed and required min assist to lift left leg into bed, mod assist to reposition toward head of bed using, and setup assist to position K-pad to resume pain management.   OT provided family ed on use of electric heating pad.      Therapy Documentation Precautions:  Precautions Precautions: Back;Fall Required Braces or Orthoses: Spinal Brace Spinal Brace: Lumbar corset;Applied in sitting position Restrictions Weight Bearing Restrictions: No  Vital Signs: Therapy Vitals Temp: 97.5 F (36.4 C) Temp src: Oral Pulse Rate: 68 Resp: 17 BP: 106/66 mmHg Patient Position (if appropriate): Sitting Oxygen Therapy SpO2: 95 % O2 Device: None (Room air)  Pain: 3/10   See FIM for current  functional status  Therapy/Group: Individual Therapy  BARTHOLD,FRANK 10/25/2013, 3:32 PM

## 2013-10-25 NOTE — Progress Notes (Signed)
Physical Therapy Note  Patient Details  Name: Mark Joseph MRN: [id number] Date of Birth: 04/21/1922 Today's Date: 10/25/2013  Time: 2:45 - 3:15 30 minutes Missed first 15 minutes of PT due to pt fatigue.  1:1, no c/o pain at rest, c/o pain in R hip during mobility, eases with rest.  Pt's wife present for session.  Due to pt fatigue, session focused on stretching R hip and bed mobility.  Pt received supine in bed, HOB elevated.  Mod A with lateral scooting, max A with scooting toward HOB.  2 x 10 minutes prolonged stretching of R hip flexors in supine with R LE hanging off side of bed, manual soft tissue massage and pressure to reach terminal stretch while avoiding back arching.  Pt educated on importance of posture and varying position throughout the day to improve mobility and avoid contracture development.  Pt min A with rolling using bed rails, discussed possibility of bed rails and AE at home, will further evaluate need.  Alyson InglesKibiloski, Sarah 10/25/2013, 3:16 PM

## 2013-10-25 NOTE — Progress Notes (Signed)
[place] PHYSICAL MEDICINE & REHABILITATION     PROGRESS NOTE    Subjective/Complaints: Had a lot of loose stool last night (soft per RN notes). Right hip feeling better.  Review of Systems - fatigue reported by wife, pt, team yesterday  Objective: Vital Signs: Blood pressure 166/83, pulse 63, temperature 98.6 F (37 C), temperature source Oral, resp. rate 18, height 5\' 8"  (1.727 m), weight 74 kg (163 lb 2.3 oz), SpO2 91.00%. [place] Soft Tissue Head/neck  10/23/2013   CLINICAL DATA:  Right neck nodule/pain  EXAM: ULTRASOUND OF HEAD/NECK SOFT TISSUES  TECHNIQUE: Ultrasound examination of the head and neck soft tissues was performed in the area of clinical concern.  COMPARISON:  None.  FINDINGS: No adenopathy, mass, cyst, abscess, or other focal lesion is identified in the region of concern. The carotid bifurcation lies deep to the region of concern.  IMPRESSION: No ultrasound pathology in region of concern.   Electronically Signed   By: Oley Balmaniel  Hassell M.D.   On: 10/23/2013 16:01    Recent Labs  10/24/13 1513  WBC 9.1  HGB 8.7*  HCT 24.9*  PLT 373    Recent Labs  10/24/13 1513  NA 133*  K 4.7  CL 91*  GLUCOSE 112*  BUN 27*  CREATININE 1.09  CALCIUM 8.8   CBG (last 3)  No results found for this basename: GLUCAP,  in the last 72 hours  Wt Readings from Last 3 Encounters:  10/18/13 74 kg (163 lb 2.3 oz)  10/06/13 67.586 kg (149 lb)  10/06/13 67.586 kg (149 lb)    Physical Exam:  Constitutional: He is oriented to person, place, and time. He appears well-developed.  HENT:  Head: Normocephalic.  Eyes: EOM are normal.  Neck: Normal range of motion. Soft, mobile nodule just above right thyroid--which is still tender Cardiovascular:  Cardiac rate controlled. No murmurs, rubs, gallops  Respiratory: Effort normal and breath sounds normal. No respiratory distress. No wheezes  GI: Soft. Bowel sounds are normal. He exhibits no distension. No tenderness Neurological: He is  alert and oriented to person, place, and time.  UE's grossly 4/5 proximal to distal. RLE: 2+ RHF, 2- RKE, 4/5 R ankles. LLE: 3+ HF, 3 KE and 4/5 ankles. No gross sensory deficits . Proximal movement limited by pain.  Skin:  Flank incision clean. No skin breakdown on buttock/low back Psychiatric: He has a normal mood and affect. His behavior is normal. Thought content normal    Assessment/Plan: 1. Functional deficits secondary to L2 compression fx, right L2 radiculopathy which require 3+ hours per day of interdisciplinary therapy in a comprehensive inpatient rehab setting. Physiatrist is providing close team supervision and 24 hour management of active medical problems listed below. Physiatrist and rehab team continue to assess barriers to discharge/monitor patient progress toward functional and medical goals. FIM: FIM - Bathing Bathing Steps Patient Completed: Chest;Right Arm;Left Arm;Abdomen;Front perineal area;Right upper leg;Left upper leg Bathing: 4: Min-Patient completes 8-9 2533f 10 parts or 75+ percent  FIM - Upper Body Dressing/Undressing Upper body dressing/undressing steps patient completed: Thread/unthread right sleeve of pullover shirt/dresss;Thread/unthread left sleeve of pullover shirt/dress;Put head through opening of pull over shirt/dress;Pull shirt over trunk Upper body dressing/undressing: 5: Set-up assist to: Obtain clothing/put away FIM - Lower Body Dressing/Undressing Lower body dressing/undressing steps patient completed: Pull underwear up/down;Thread/unthread left pants leg Lower body dressing/undressing: 2: Max-Patient completed 25-49% of tasks  FIM - Toileting Toileting steps completed by patient: Adjust clothing prior to toileting Toileting Assistive Devices: Grab bar  or rail for support Toileting: 2: Max-Patient completed 1 of 3 steps  FIM - ArchivistToilet Transfers Toilet Transfers: 0-Activity did not occur  FIM - BankerBed/Chair Transfer Bed/Chair Transfer Assistive  Devices: Arm rests;Bed rails;Orthosis Bed/Chair Transfer: 3: Supine > Sit: Mod A (lifting assist/Pt. 50-74%/lift 2 legs;2: Sit > Supine: Max A (lifting assist/Pt. 25-49%);2: Chair or W/C > Bed: Max A (lift and lower assist)  FIM - Locomotion: Wheelchair Distance: 150 Locomotion: Wheelchair: 0: Activity did not occur FIM - Locomotion: Ambulation Locomotion: Ambulation Assistive Devices: Designer, industrial/productWalker - Rolling Ambulation/Gait Assistance: 3: Mod assist Locomotion: Ambulation: 0: Activity did not occur  Comprehension Comprehension Mode: Auditory Comprehension: 4-Understands basic 75 - 89% of the time/requires cueing 10 - 24% of the time  Expression Expression Mode: Verbal Expression: 5-Expresses complex 90% of the time/cues < 10% of the time  Social Interaction Social Interaction: 5-Interacts appropriately 90% of the time - Needs monitoring or encouragement for participation or interaction.  Problem Solving Problem Solving: 5-Solves basic 90% of the time/requires cueing < 10% of the time  Memory Memory: 5-Recognizes or recalls 90% of the time/requires cueing < 10% of the time   Medical Problem List and Plan:  1. Functional deficits secondary to L2 compression fracture/radiculopathy status post fusion and arthrodesis 6/4. Lumbar corset when out of bed  2. DVT Prophylaxis/Anticoagulation:eliquis resumed 3. Pain Management: Hydrocodone and Robaxin as needed. Still thing this is referred pain from operation/incision. 4. History CVA 2013. eliquis resumed 5. Neuropsych: This patient is capable of making decisions on his own behalf.  6. Hypertension/atrial fibrillation. Amiodarone 100 mg once per day Saturday Sunday and 200 mg all other days, Lopressor 12.5 mg twice a day, hydrochlorothiazide 12.5 mg daily, lisinopril 10 mg daily.    7. BPH. Proscar 5 mg daily and Flomax 0.4 mg daily.    8. Hyperlipidemia. Zocor  9. GERD. Protonix  10.  Constipation: continue current regimen 11. Right neck  nodule/pain---likely lymph node. No thyroid abnl or other pathology noted on u/s 12. Malaise: ?med related. Slightly dry on labwork. ucx pendin, ua neg  -push fluids  -recheck bmet today  LOS (Days) 8 A FACE TO FACE EVALUATION WAS PERFORMED  SWARTZ,ZACHARY T 10/25/2013 7:50 AM

## 2013-10-25 NOTE — Progress Notes (Signed)
Occupational Therapy Session Note  Patient Details  Name: Mark Joseph MRN: [id number] Date of Birth: 01/18/1922  Today's Date: 10/25/2013 Time: 0730-0830 Time Calculation (min): 60 min  Short Term Goals: Week 1:  OT Short Term Goal 1 (Week 1): STG=LTG  Skilled Therapeutic Interventions/Progress Updates: ADL-retraining with focus on improved mobility, pain management, transfers, and endurance.   Patient received supine in bed discussing pain management with MD.   Patient reports poor night sleep, upset GI (diarrhea), and continued right hip discomfort/stiffness.   Patient required mod verbal cues and extensive setup to initiate self-feeding sitting in w/c and to progress to bathing and assisted dressing.   Patient completed transfers: bed>w/c, w/c>shower chair and back with min-mod assist overall and required only min assist to wash buttocks (w/brace re-applied) while standing in shower stall.  Patient required extensive assist to expedite dressing seated at EOB due to distraction from spouse and limited time to accomplish task.  Patient requires approx 90 min to complete ADL at his pace using AE and setup assist with min vc to problem-solve.  Patient left in w/c with spouse attending to assist with grooming and resumption of breakfast.  Therapy Documentation Precautions:  Precautions Precautions: Back;Fall Required Braces or Orthoses: Spinal Brace Spinal Brace: Lumbar corset;Applied in sitting position Restrictions Weight Bearing Restrictions: No   Pain: Pain Assessment Pain Assessment: 0-10 Pain Score: 3  Pain Type: Acute pain Pain Location: Incision Pain Orientation: Left;Posterior;Mid Pain Frequency: Intermittent Pain Onset: On-going Patients Stated Pain Goal: 0  See FIM for current functional status  Therapy/Group: Individual Therapy   BARTHOLD,FRANK 10/25/2013, 8:42 AM

## 2013-10-25 NOTE — Progress Notes (Signed)
I have reviewed and I agree with the following eval/treatment note.  Audra Hall, PT, DPT  

## 2013-10-25 NOTE — Progress Notes (Signed)
NUTRITION FOLLOW UP  Intervention:   Continue Ensure Complete po BID, each supplement providing 350 kcals and 13 grams of protein.    Nutrition Dx:   Inadequate oral intake related to poor appetite as evidenced by pt report of poor appetite during current hospitalization; improving  Goal:   Pt to meet >/=90% of estimated nutrition needs; met with meals + supplement intake  Monitor:   PO intake, supplement acceptance, weight trend, labs  Assessment:   Pt is s/p mechanical fall March 2015. Xray identified L2 compression fracture.Pt had prolonged stay in hospital and rehab in March 2015. Since discharge pt has been increasing in pain, walking, and urinary incontinence. Pt admitted to [facility] 5/29. Pt admitted for pain and new onset atrial fibrillation. Admitted to CIR on 6/9.  Patient is now s/p [place] on 6/4.   6/10: Pt was very active, "Driving myself to the [facility] everyday" prior to this fall in March. Pt's appetite has been very good up until this hospitalization. States that he does not like the food very much and his appetite is not good. Encouraged pt's wife to bring in food from home or get a subway sandwich if pt will accept better than hospital food. Pt's wife stated that he was drinking Ensure at home. Pt's wife reports that he lost weight in March, but has recently gained most of it back. EPIC chart confirms this. Pt currently ordered for a Regular diet and eating 50-75% of meals.   6/17: Pt asleep at time of visit. Per nursing notes pt was consuming 25-75% of meals and the past 3 days intake has improved to 50-100% of meals. Per order history pt has been accepting Ensure Complete twice daily. Per weight history pt's weight has dropped 13 lbs this past week but, weight is about the same as 3 weeks ago. RD to continue to monitor.  Lunch tray at bedside at time of visit with about 80% consumed.     Height: Ht Readings from Last 1  Encounters:  10/17/13 5' 8"  (1.727 m)    Weight Status:   Wt Readings from Last 1 Encounters:  10/25/13 150 lb 11.2 oz (68.357 kg)  10/18/13 163 lb 10/06/13 149 lb  Re-estimated needs:  Kcal: 1600-1800  Protein: 75 - 85 grams  Fluid: >/= 1.5 L/day    Skin: non-pitting RLE and LLE edema; closed incisions on back, hip, and abdomen  Diet Order: General   Intake/Output Summary (Last 24 hours) at 10/25/13 1343 Last data filed at 10/25/13 1314  Gross per 24 hour  Intake    840 ml  Output   3100 ml  Net  -2260 ml    Last BM: 6/17   Labs:   Recent Labs Lab 10/24/13 1513  NA 133*  K 4.7  CL 91*  CO2 27  BUN 27*  CREATININE 1.09  CALCIUM 8.8  GLUCOSE 112*    CBG (last 3)  No results found for this basename: GLUCAP,  in the last 72 hours  Scheduled Meds: . amiodarone  100 mg Oral Once per day on Sun Sat  . amiodarone  200 mg Oral Once per day on Mon Tue Wed Thu Fri  . apixaban  5 mg Oral BID  . calcium carbonate  1,250 mg Oral BID  . diclofenac sodium  2 g Topical QID  . docusate sodium  100 mg Oral BID  . feeding supplement (ENSURE COMPLETE)  237 mL Oral BID BM  .  finasteride  5 mg Oral Daily  . lisinopril  10 mg Oral Daily   And  . hydrochlorothiazide  12.5 mg Oral Daily  . metoprolol tartrate  12.5 mg Oral BID  . pantoprazole  40 mg Oral Daily  . simvastatin  10 mg Oral q1800  . tamsulosin  0.4 mg Oral QPC breakfast    Continuous Infusions:   Pryor Ochoa RD, LDN Inpatient Clinical Dietitian Pager: [phone] After Hours Pager: [phone]

## 2013-10-26 ENCOUNTER — Inpatient Hospital Stay (HOSPITAL_COMMUNITY): Payer: Medicare Other

## 2013-10-26 ENCOUNTER — Encounter (HOSPITAL_COMMUNITY): Payer: Medicare Other

## 2013-10-26 LAB — URINE CULTURE

## 2013-10-26 MED ORDER — SACCHAROMYCES BOULARDII 250 MG PO CAPS
250.0000 mg | ORAL_CAPSULE | Freq: Two times a day (BID) | ORAL | Status: DC
  Administered 2013-10-26 – 2013-11-03 (×17): 250 mg via ORAL
  Filled 2013-10-26 (×19): qty 1

## 2013-10-26 NOTE — Progress Notes (Signed)
Social Work Patient ID: Mark Joseph, male   DOB: [birth year], 80 y.o.   MRN: [id number]  Met with pt and wife this morning to review team conference.  Both aware and agreeable with targeted d/c date of 6/25, however, both concerned about LOC he may still need at that time.  Wife states her primary concern is that pt's po intake is so poor - she plans to begin bringing in meals from home.  She is also concerned about his bowel issues and currently level of fatigue.  Explained that tx team shares these concerns, therefore, we will monitor his improvement over next few days.  Both aware there is an option for SNF if he does not reach a functional level that wife can manage at home.  Both hopeful they will be able to avoid having to do this.  HOYLE, LUCY, LCSW

## 2013-10-26 NOTE — Progress Notes (Signed)
Physical Therapy Session Note  Patient Details  Name: Mark Joseph MRN: [id number] Date of Birth: 05/19/1921  Today's Date: 10/26/2013 Time: [id number] Time Calculation (min): 43 min  Short Term Goals: Week 1:  PT Short Term Goal 1 (Week 1): Pt will perform bed mobility with min A PT Short Term Goal 1 - Progress (Week 1): Progressing toward goal PT Short Term Goal 2 (Week 1): Pt will perform functional transfers with min A PT Short Term Goal 2 - Progress (Week 1): Progressing toward goal Week 2:  PT Short Term Goal 1 (Week 2): = LTG of supervision overall for transfers and gait  Skilled Therapeutic Interventions/Progress Updates:  Pt sitting in w/c eating breakfast. Pt. Required repositioning with back brace/corset adjustment for increased support. Pt's wife arrived shortly after tx started.  Self-Care/Family Education: Pt's wife raised concerns with patient's endurance and schedule of therapies. Pt. States he performs better with rest breaks between therapies and wife expressed concern about meal consumption. SW notified and will address patient and wife's questions.  Therapeutic Exercise:  Hip flexion, knee extension, ankle dori/plantar flexion.All exercises performed 2x10 bilateral LEs while seated in w/c with therapist initiation and min A for completion of sets. Pt. Displays inattentiveness when performing counting task; often lost track of sequential numbers and required extra time to perform with frequent verbal and tactile cues.    Pt. Participated in therapeutic exercises with no c/o of discomfort or pain; R knee in full extension did not generate any symptoms.   Therapy Documentation Precautions:  Precautions Precautions: Back;Fall Required Braces or Orthoses: Spinal Brace Spinal Brace: Lumbar corset;Applied in sitting position Restrictions Weight Bearing Restrictions: No  Pain Assessment Pain Assessment: No/denies pain Pain Score: 0-No pain  See FIM for current  functional status  Therapy/Group: Individual Therapy  Raiford NobleSmith, Bradley R 10/26/2013, 11:47 AM

## 2013-10-26 NOTE — Progress Notes (Signed)
[place] PHYSICAL MEDICINE & REHABILITATION     PROGRESS NOTE    Subjective/Complaints: Hips feeling better although left side sore. Had loose stool still yesterday and overnight Review of Systems - fatigue reported by wife, pt, team yesterday  Objective: Vital Signs: Blood pressure 172/88, pulse 61, temperature 97.7 F (36.5 C), temperature source Oral, resp. rate 18, height 5\' 8"  (1.727 m), weight 68.357 kg (150 lb 11.2 oz), SpO2 95.00%. No results found.  Recent Labs  10/24/13 1513  WBC 9.1  HGB 8.7*  HCT 24.9*  PLT 373    Recent Labs  10/24/13 1513 10/25/13 1540  NA 133* 132*  K 4.7 4.5  CL 91* 91*  GLUCOSE 112* 122*  BUN 27* 26*  CREATININE 1.09 1.05  CALCIUM 8.8 9.0   CBG (last 3)  No results found for this basename: GLUCAP,  in the last 72 hours  Wt Readings from Last 3 Encounters:  10/25/13 68.357 kg (150 lb 11.2 oz)  10/06/13 67.586 kg (149 lb)  10/06/13 67.586 kg (149 lb)    Physical Exam:  Constitutional: He is oriented to person, place, and time. He appears well-developed.  HENT:  Head: Normocephalic.  Eyes: EOM are normal.  Neck: Normal range of motion. Soft, mobile nodule just above right thyroid--which is still tender Cardiovascular:  Cardiac rate controlled. No murmurs, rubs, gallops  Respiratory: Effort normal and breath sounds normal. No respiratory distress. No wheezes  GI: Soft. Bowel sounds are normal. He exhibits no distension. No tenderness Neurological: He is alert and oriented to person, place, and time.  UE's grossly 4/5 proximal to distal. RLE: 2+ RHF, 2- RKE, 4/5 R ankles. LLE: 3+ HF, 3 KE and 4/5 ankles. No gross sensory deficits . Proximal movement limited by pain.  Skin:  Flank incision clean. No skin breakdown on buttock/low back Psychiatric: He has a normal mood and affect. His behavior is normal. Thought content normal    Assessment/Plan: 1. Functional deficits secondary to L2 compression fx, right L2 radiculopathy  which require 3+ hours per day of interdisciplinary therapy in a comprehensive inpatient rehab setting. Physiatrist is providing close team supervision and 24 hour management of active medical problems listed below. Physiatrist and rehab team continue to assess barriers to discharge/monitor patient progress toward functional and medical goals. FIM: FIM - Bathing Bathing Steps Patient Completed: Chest;Right Arm;Left Arm;Abdomen;Front perineal area;Right upper leg;Left upper leg;Right lower leg (including foot);Left lower leg (including foot) Bathing: 4: Min-Patient completes 8-9 3467f 10 parts or 75+ percent  FIM - Upper Body Dressing/Undressing Upper body dressing/undressing steps patient completed: Thread/unthread right sleeve of pullover shirt/dresss;Thread/unthread left sleeve of pullover shirt/dress;Put head through opening of pull over shirt/dress;Pull shirt over trunk;Thread/unthread right sleeve of front closure shirt/dress;Thread/unthread left sleeve of front closure shirt/dress;Pull shirt around back of front closure shirt/dress;Button/unbutton shirt Upper body dressing/undressing: 6: More than reasonable amount of time FIM - Lower Body Dressing/Undressing Lower body dressing/undressing steps patient completed: Pull underwear up/down;Thread/unthread left pants leg Lower body dressing/undressing: 1: Total-Patient completed less than 25% of tasks  FIM - Toileting Toileting steps completed by patient: Adjust clothing prior to toileting Toileting Assistive Devices: Grab bar or rail for support Toileting: 2: Max-Patient completed 1 of 3 steps  FIM - ArchivistToilet Transfers Toilet Transfers: 0-Activity did not occur  FIM - BankerBed/Chair Transfer Bed/Chair Transfer Assistive Devices: Walker;Orthosis Bed/Chair Transfer: 3: Sit > Supine: Mod A (lifting assist/Pt. 50-74%/lift 2 legs);3: Supine > Sit: Mod A (lifting assist/Pt. 50-74%/lift 2 legs;3: Bed > Chair or  W/C: Mod A (lift or lower assist);3: Chair  or W/C > Bed: Mod A (lift or lower assist)  FIM - Locomotion: Wheelchair Distance: 150 Locomotion: Wheelchair: 1: Total Assistance/staff pushes wheelchair (Pt<25%) FIM - Locomotion: Ambulation Locomotion: Ambulation Assistive Devices: Designer, industrial/productWalker - Rolling Ambulation/Gait Assistance: 3: Mod assist Locomotion: Ambulation: 1: Travels less than 50 ft with moderate assistance (Pt: 50 - 74%)  Comprehension Comprehension Mode: Auditory Comprehension: 4-Understands basic 75 - 89% of the time/requires cueing 10 - 24% of the time  Expression Expression Mode: Verbal Expression: 5-Expresses complex 90% of the time/cues < 10% of the time  Social Interaction Social Interaction: 5-Interacts appropriately 90% of the time - Needs monitoring or encouragement for participation or interaction.  Problem Solving Problem Solving: 5-Solves basic 90% of the time/requires cueing < 10% of the time  Memory Memory: 5-Recognizes or recalls 90% of the time/requires cueing < 10% of the time   Medical Problem List and Plan:  1. Functional deficits secondary to L2 compression fracture/radiculopathy status post fusion and arthrodesis 6/4. Lumbar corset when out of bed  2. DVT Prophylaxis/Anticoagulation:eliquis resumed 3. Pain Management: Hydrocodone and Robaxin as needed. Still thing this is referred pain from operation/incision. 4. History CVA 2013. eliquis resumed 5. Neuropsych: This patient is capable of making decisions on his own behalf.  6. Hypertension/atrial fibrillation. Amiodarone 100 mg once per day Saturday Sunday and 200 mg all other days, Lopressor 12.5 mg twice a day, hydrochlorothiazide 12.5 mg daily, lisinopril 10 mg daily.    7. BPH. Proscar 5 mg daily and Flomax 0.4 mg daily.    8. Hyperlipidemia. Zocor  9. GERD. Protonix  10.  Constipation: continue current regimen 11. Right neck nodule/pain---likely lymph node. No thyroid abnl or other pathology noted on u/s 12. Loose stool/FEN: somewhat  improved but stool still soft, frequent overnight  -stop supplement   -continue to push fluids  -recheck bmet tomorrow  LOS (Days) 9 A FACE TO FACE EVALUATION WAS PERFORMED  SWARTZ,ZACHARY T 10/26/2013 7:51 AM

## 2013-10-26 NOTE — Progress Notes (Signed)
Occupational Therapy Session Note  Patient Details  Name: Mark Joseph MRN: [id number] Date of Birth: 08/29/1921  Today's Date: 10/26/2013 Time: [id number] Time Calculation (min): 44 min  Short Term Goals: Week 1:  OT Short Term Goal 1 (Week 1): STG=LTG  Skilled Therapeutic Interventions/Progress Updates:    Pt seated in w/c with wife present.  Pt stated that since he was only scheduled for 45 mins for bathing and dressing this morning he thought it best to bathe and dress at sink.  Pt directed care approx 50% of time with occasionally asking "what should I do next?" Pt used reacher appropriately to thread underpants and pants.  Pt stood at sink with min A to pull up pants but required assistance with bathing buttocks. Pt stated that he would practice putting on socks and shoes later because it takes a long time.  Pt remained in w/c awaiting lunch.  Focus on activity tolerance, sit<>stand, standing balance, following back precautions, AE use, and safety awareness.  Therapy Documentation Precautions:  Precautions Precautions: Back;Fall Required Braces or Orthoses: Spinal Brace Spinal Brace: Lumbar corset;Applied in sitting position Restrictions Weight Bearing Restrictions: No Pain: Pain Assessment Pain Assessment: No/denies pain Pain Score: 0-No pain  See FIM for current functional status  Therapy/Group: Individual Therapy  [name], [name] 10/26/2013, 12:01 PM

## 2013-10-26 NOTE — Progress Notes (Signed)
Reviewed and in agreement with treatment provided.  

## 2013-10-26 NOTE — Progress Notes (Signed)
Physical Therapy Note  Patient Details  Name: Mark Joseph MRN: [id number] Date of Birth: 10/27/1921 Today's Date: 10/26/2013  Time 1:  0830 - 0930 60 minutes  1:1, c/o 4/10 pain in R low back and side during repeated sit <> stand, eases with rest.  Session focused on functional mobility, standing balance, gait, LE strengthening, and activity tolerance.  2 x 150' w/c with LE propulsion, required supervision.  Multiple sit <> stand using RW, required mod A initially, problem solved weight shifting and UE placement during standing and pt progressed to min A/CGA.  3 x 5 sit <> stand with ring toss activity, pt required CGA for standing balance.  3 x 30 ' gait through obstacles requiring forward and side stepping, required CGA/min A for balance.   Time 2: 1330 - 1415 45 minutes  1:1, c/o 2/10 pain in R hip at rest, increases with mobility, eases with rest.  Pt propelled w/c 2 x 150' using LE with supervision.  NuStep level 3, 2 x 5 min, not using UE.  Min A for sit <> stand, cueing for sequencing and UE placement.  3 x 20' ambulation using RW, stepping over obstacles to simulate thresholds at home, required min/mod A for balance and safety initially but progressed to CGA.  Sit > sidelying with min A for BLE management, supervision with rolling.  Pt left supine in bed, HOB elevated.  Alyson InglesKibiloski, Sarah 10/26/2013, 12:21 PM

## 2013-10-27 ENCOUNTER — Inpatient Hospital Stay (HOSPITAL_COMMUNITY): Payer: Medicare Other

## 2013-10-27 ENCOUNTER — Inpatient Hospital Stay (HOSPITAL_COMMUNITY): Payer: Medicare Other | Admitting: Physical Therapy

## 2013-10-27 DIAGNOSIS — I10 Essential (primary) hypertension: Secondary | ICD-10-CM

## 2013-10-27 DIAGNOSIS — I4891 Unspecified atrial fibrillation: Secondary | ICD-10-CM

## 2013-10-27 DIAGNOSIS — R5381 Other malaise: Secondary | ICD-10-CM

## 2013-10-27 DIAGNOSIS — S32009A Unspecified fracture of unspecified lumbar vertebra, initial encounter for closed fracture: Secondary | ICD-10-CM

## 2013-10-27 LAB — BASIC METABOLIC PANEL
BUN: 22 mg/dL (ref 6–23)
CHLORIDE: 91 meq/L — AB (ref 96–112)
CO2: 27 mEq/L (ref 19–32)
Calcium: 9.3 mg/dL (ref 8.4–10.5)
Creatinine, Ser: 1.04 mg/dL (ref 0.50–1.35)
GFR calc Af Amer: 71 mL/min — ABNORMAL LOW (ref >90)
GFR calc non Af Amer: 62 mL/min — ABNORMAL LOW (ref >90)
Glucose, Bld: 97 mg/dL (ref 70–99)
POTASSIUM: 4.3 meq/L (ref 3.7–5.3)
Sodium: 131 mEq/L — ABNORMAL LOW (ref 137–147)

## 2013-10-27 NOTE — Progress Notes (Signed)
Occupational Therapy Session Note  Patient Details  Name: Mark CroonLeon A Thresher MRN: [id number] Date of Birth: 01/29/1922  Today's Date: 10/27/2013 Time: 1100-1200 Time Calculation (min): 60 min  Short Term Goals: Week 1:  OT Short Term Goal 1 (Week 1): STG=LTG  Skilled Therapeutic Interventions/Progress Updates: ADL-retraining with focus on improved use of AE during lower body dressing, family ed (spouse) on assist required for performance of ADL and transfers, dynamic sitting balance at edge of bed, and general endurance.   Patient received in his w/c with spouse present.   Patient able to rise with min assist (pt=85%), ambulate to shower with standby assist and bathe with setup and verbal cues to problem-solve and progress through task.   OT discussed with spouse patient's mild decline in cognition as evidenced by need for increased cues, setup and assist with problem-solving.   Patient completed bathing and dressed at edge of bed, using AE with setup and min assist to manage left leg with pants and catheter bag.   Patient required continued assist with using AE as he was unable to problem-solve when having difficulty with sock aid and reacher.    Therapy Documentation Precautions:  Precautions Precautions: Back;Fall Required Braces or Orthoses: Spinal Brace Spinal Brace: Lumbar corset;Applied in sitting position Restrictions Weight Bearing Restrictions: No  Pain: Pain Assessment Pain Assessment: No/denies pain  See FIM for current functional status  Therapy/Group: Individual Therapy  Second session: Time: 1415-1500 Time Calculation (min):  45 min  Pain Assessment: No/denies pain  Skilled Therapeutic Interventions: Therapeutic activity with focus on endurance, transfers, functional mobility, and standing tolerance.   Patient completed 15 min, NuStep, level 2-3, 751 steps total, and rating exertion as 13 per BORG.  HR/BP assessed as follows: 67 bpm, 113/65.   Patient then ambulated to  raised tabletop and completed 3 games of GoBan, while standing, after brief rest break.   Patient sustained dynamic standing tolerance for 3 min, 45 sec, supporting himself with LUE while playing game with right, before requesting rest break again.  Patient reported exhaustion after resting which prevented him from completing mobility back to his room from gym.   Patient required max assist to recover both legs during transfer to bed, due to weakness and exhaustion.  See FIM for current functional status  Therapy/Group: Individual Therapy  BARTHOLD,FRANK 10/27/2013, 12:17 PM

## 2013-10-27 NOTE — Progress Notes (Signed)
Physical Therapy Note  Patient Details  Name: Mark Joseph MRN: [id number] Date of Birth: 02/04/1922 Today's Date: 10/27/2013  Time: 1300 - 1330 30 minutes  1:1, c/o pain in R hip with change in position, eases with rest.  Session focused on gait, functional transfers, and activity tolerance.  Pt propelled w/c using LE for 200' with mod I.  Performed gait through obstacles with forward and side stepping using RW.  Pt required CGA during gait with cueing for pain management during side stepping, demonstrated increased activity tolerance throughout session.  Min A/CGA for sit <> stand from w/c using RW.  Ambulated 100' in controlled environment using RW with CGA.  Pt educated on energy conservation and expressed understanding.  Alyson InglesKibiloski, Sarah 10/27/2013, 2:06 PM

## 2013-10-27 NOTE — Progress Notes (Signed)
Reviewed and in agreement with treatment provided.  

## 2013-10-27 NOTE — Progress Notes (Signed)
[place] PHYSICAL MEDICINE & REHABILITATION     PROGRESS NOTE    Subjective/Complaints: Pain better. No more loose stools Review of Systems - fatigue reported by wife, pt, team yesterday  Objective: Vital Signs: Blood pressure 183/75, pulse 55, temperature 97.7 F (36.5 C), temperature source Oral, resp. rate 18, height 5\' 8"  (1.727 m), weight 68.357 kg (150 lb 11.2 oz), SpO2 98.00%. No results found.  Recent Labs  10/24/13 1513  WBC 9.1  HGB 8.7*  HCT 24.9*  PLT 373    Recent Labs  10/25/13 1540 10/27/13 0622  NA 132* 131*  K 4.5 4.3  CL 91* 91*  GLUCOSE 122* 97  BUN 26* 22  CREATININE 1.05 1.04  CALCIUM 9.0 9.3   CBG (last 3)  No results found for this basename: GLUCAP,  in the last 72 hours  Wt Readings from Last 3 Encounters:  10/25/13 68.357 kg (150 lb 11.2 oz)  10/06/13 67.586 kg (149 lb)  10/06/13 67.586 kg (149 lb)    Physical Exam:  Constitutional: He is oriented to person, place, and time. He appears well-developed.  HENT:  Head: Normocephalic.  Eyes: EOM are normal.  Neck: Normal range of motion. Soft, mobile nodule just above right thyroid--which is still tender Cardiovascular:  Cardiac rate controlled. No murmurs, rubs, gallops  Respiratory: Effort normal and breath sounds normal. No respiratory distress. No wheezes  GI: Soft. Bowel sounds are normal. He exhibits no distension. No tenderness Neurological: He is alert and oriented to person, place, and time.  UE's grossly 4/5 proximal to distal. RLE: 3- RHF, 2 RKE, 4/5 R ankles. LLE: 3+ HF, 3 KE and 4/5 ankles. No gross sensory deficits .   Skin:  Flank incision clean. No skin breakdown on buttock/low back Psychiatric: He has a normal mood and affect. His behavior is normal. Thought content normal    Assessment/Plan: 1. Functional deficits secondary to L2 compression fx, right L2 radiculopathy which require 3+ hours per day of interdisciplinary therapy in a comprehensive inpatient rehab  setting. Physiatrist is providing close team supervision and 24 hour management of active medical problems listed below. Physiatrist and rehab team continue to assess barriers to discharge/monitor patient progress toward functional and medical goals. FIM: FIM - Bathing Bathing Steps Patient Completed: Chest;Right Arm;Left Arm;Abdomen;Front perineal area;Right upper leg;Left upper leg Bathing: 4: Min-Patient completes 8-9 5745f 10 parts or 75+ percent  FIM - Upper Body Dressing/Undressing Upper body dressing/undressing steps patient completed: Thread/unthread right sleeve of pullover shirt/dresss;Thread/unthread left sleeve of pullover shirt/dress;Put head through opening of pull over shirt/dress;Pull shirt over trunk;Thread/unthread right sleeve of front closure shirt/dress;Thread/unthread left sleeve of front closure shirt/dress;Pull shirt around back of front closure shirt/dress;Button/unbutton shirt Upper body dressing/undressing: 6: More than reasonable amount of time FIM - Lower Body Dressing/Undressing Lower body dressing/undressing steps patient completed: Thread/unthread right underwear leg;Thread/unthread left underwear leg;Pull underwear up/down;Thread/unthread right pants leg;Thread/unthread left pants leg;Pull pants up/down Lower body dressing/undressing: 3: Mod-Patient completed 50-74% of tasks  FIM - Toileting Toileting steps completed by patient: Adjust clothing prior to toileting Toileting Assistive Devices: Grab bar or rail for support Toileting: 2: Max-Patient completed 1 of 3 steps  FIM - ArchivistToilet Transfers Toilet Transfers: 0-Activity did not occur  FIM - BankerBed/Chair Transfer Bed/Chair Transfer Assistive Devices: Walker;Orthosis Bed/Chair Transfer: 3: Sit > Supine: Mod A (lifting assist/Pt. 50-74%/lift 2 legs);3: Supine > Sit: Mod A (lifting assist/Pt. 50-74%/lift 2 legs;3: Bed > Chair or W/C: Mod A (lift or lower assist);3: Chair or W/C >  Bed: Mod A (lift or lower  assist)  FIM - Locomotion: Wheelchair Distance: 150 Locomotion: Wheelchair: 1: Total Assistance/staff pushes wheelchair (Pt<25%) FIM - Locomotion: Ambulation Locomotion: Ambulation Assistive Devices: Designer, industrial/productWalker - Rolling Ambulation/Gait Assistance: 3: Mod assist Locomotion: Ambulation: 1: Travels less than 50 ft with moderate assistance (Pt: 50 - 74%)  Comprehension Comprehension Mode: Auditory Comprehension: 4-Understands basic 75 - 89% of the time/requires cueing 10 - 24% of the time  Expression Expression Mode: Verbal Expression: 5-Expresses complex 90% of the time/cues < 10% of the time  Social Interaction Social Interaction: 5-Interacts appropriately 90% of the time - Needs monitoring or encouragement for participation or interaction.  Problem Solving Problem Solving: 5-Solves basic 90% of the time/requires cueing < 10% of the time  Memory Memory: 5-Recognizes or recalls 90% of the time/requires cueing < 10% of the time   Medical Problem List and Plan:  1. Functional deficits secondary to L2 compression fracture/radiculopathy status post fusion and arthrodesis 6/4. Lumbar corset when out of bed  2. DVT Prophylaxis/Anticoagulation: eliquis resumed 3. Pain Management: Hydrocodone and Robaxin as needed. Still thing this is referred pain from operation/incision. 4. History CVA 2013. eliquis resumed 5. Neuropsych: This patient is capable of making decisions on his own behalf.  6. Hypertension/atrial fibrillation. Amiodarone 100 mg once per day Saturday Sunday and 200 mg all other days, Lopressor 12.5 mg twice a day, hydrochlorothiazide 12.5 mg daily, lisinopril 10 mg daily.    7. BPH. Proscar 5 mg daily and Flomax 0.4 mg daily.    8. Hyperlipidemia. Zocor  9. GERD. Protonix  10.  Constipation: continue current regimen 11. Right neck nodule/pain---likely lymph node. No thyroid abnl or other pathology noted on u/s 12. Loose stool/FEN: somewhat improved but stool still soft, frequent  overnight  -stop supplement   -continue to push fluids  -bmet essentially normal today. Keep an eye on sodium  LOS (Days) 10 A FACE TO FACE EVALUATION WAS PERFORMED  SWARTZ,ZACHARY T 10/27/2013 7:55 AM

## 2013-10-27 NOTE — Progress Notes (Signed)
Physical Therapy Note  Patient Details  Name: Mark Joseph MRN: [id number] Date of Birth: 02/17/1922 Today's Date: 10/27/2013  Time: 0825 - 0920 55 minutes  1:1, c/o 3/10 pain in R hip with mobility, eases with rest and stretching.  Session focused on functional mobility, gait, standing balance, and activity tolerance.  Pt propelled w/c using LE for 150' with mod I.  Sit <> stand with CGA/min A, progressed to CGA during session, problem solved for pain management in R hip while standing.  2 x 5 sit <> stand with ring toss, required CGA for standing balance.  Ambulated 2 x 100' and 150' with 1 standing rest break using RW with CGA.  Multiple sit <> sidelying <> supine, pt initially required min A for LE management but progressed to supervision with cueing to avoid twisting.  Alyson InglesKibiloski, Sarah 10/27/2013, 9:22 AM

## 2013-10-28 ENCOUNTER — Encounter (HOSPITAL_COMMUNITY): Payer: Self-pay | Admitting: *Deleted

## 2013-10-28 ENCOUNTER — Inpatient Hospital Stay (HOSPITAL_COMMUNITY): Payer: Medicare Other | Admitting: Physical Therapy

## 2013-10-28 NOTE — Progress Notes (Signed)
[place] PHYSICAL MEDICINE & REHABILITATION     PROGRESS NOTE    Subjective/Complaints: Was doing better until last night when he had some soft semi-loose stools, mild left hip pain Review of Systems -otherwise neg  Objective: Vital Signs: Blood pressure 162/67, pulse 57, temperature 98.1 F (36.7 C), temperature source Oral, resp. rate 19, height 5\' 8"  (1.727 m), weight 68.357 kg (150 lb 11.2 oz), SpO2 98.00%. No results found. No results found for this basename: WBC, HGB, HCT, PLT,  in the last 72 hours  Recent Labs  10/25/13 1540 10/27/13 0622  NA 132* 131*  K 4.5 4.3  CL 91* 91*  GLUCOSE 122* 97  BUN 26* 22  CREATININE 1.05 1.04  CALCIUM 9.0 9.3   CBG (last 3)  No results found for this basename: GLUCAP,  in the last 72 hours  Wt Readings from Last 3 Encounters:  10/25/13 68.357 kg (150 lb 11.2 oz)  10/06/13 67.586 kg (149 lb)  10/06/13 67.586 kg (149 lb)    Physical Exam:  Constitutional: He is oriented to person, place, and time. He appears well-developed.  HENT:  Head: Normocephalic.  Eyes: EOM are normal.  Neck: Normal range of motion. Soft, mobile nodule just above right thyroid--which is still tender Cardiovascular:  Cardiac rate controlled. No murmurs, rubs, gallops  Respiratory: Effort normal and breath sounds normal. No respiratory distress. No wheezes  GI: Soft. Bowel sounds are normal. He exhibits no distension. No tenderness Neurological: He is alert and oriented to person, place, and time.  UE's grossly 4/5 proximal to distal. RLE: 3- RHF, 2 RKE, 4/5 R ankles. LLE: 3+ HF, 3 KE and 4/5 ankles. No gross sensory deficits .   Skin:  Flank incision clean. No skin breakdown on buttock/low back Psychiatric: He has a normal mood and affect. His behavior is normal. Thought content normal    Assessment/Plan: 1. Functional deficits secondary to L2 compression fx, right L2 radiculopathy which require 3+ hours per day of interdisciplinary therapy in a  comprehensive inpatient rehab setting. Physiatrist is providing close team supervision and 24 hour management of active medical problems listed below. Physiatrist and rehab team continue to assess barriers to discharge/monitor patient progress toward functional and medical goals. FIM: FIM - Bathing Bathing Steps Patient Completed: Chest;Right Arm;Left Arm;Abdomen;Front perineal area;Right upper leg;Left upper leg Bathing: 4: Min-Patient completes 8-9 5262f 10 parts or 75+ percent  FIM - Upper Body Dressing/Undressing Upper body dressing/undressing steps patient completed: Thread/unthread right sleeve of pullover shirt/dresss;Thread/unthread left sleeve of pullover shirt/dress;Put head through opening of pull over shirt/dress Upper body dressing/undressing: 4: Min-Patient completed 75 plus % of tasks FIM - Lower Body Dressing/Undressing Lower body dressing/undressing steps patient completed: Thread/unthread right underwear leg;Pull underwear up/down;Thread/unthread right pants leg;Pull pants up/down;Fasten/unfasten pants;Don/Doff right sock;Don/Doff left sock;Don/Doff right shoe;Don/Doff left shoe Lower body dressing/undressing: 4: Min-Patient completed 75 plus % of tasks  FIM - Toileting Toileting steps completed by patient: Adjust clothing prior to toileting Toileting Assistive Devices: Grab bar or rail for support Toileting: 2: Max-Patient completed 1 of 3 steps  FIM - ArchivistToilet Transfers Toilet Transfers: 0-Activity did not occur  FIM - BankerBed/Chair Transfer Bed/Chair Transfer Assistive Devices: Walker;Orthosis Bed/Chair Transfer: 3: Sit > Supine: Mod A (lifting assist/Pt. 50-74%/lift 2 legs);3: Supine > Sit: Mod A (lifting assist/Pt. 50-74%/lift 2 legs;3: Bed > Chair or W/C: Mod A (lift or lower assist);3: Chair or W/C > Bed: Mod A (lift or lower assist)  FIM - Locomotion: Wheelchair Distance: 150 Locomotion: Wheelchair:  1: Total Assistance/staff pushes wheelchair (Pt<25%) FIM - Locomotion:  Ambulation Locomotion: Ambulation Assistive Devices: Walker - Rolling Ambulation/Gait Assistance: 3: Mod assist Locomotion: Ambulation: 1: Travels less than 50 ft with moderate assistance (Pt: 50 - 74%)  Comprehension Comprehension Mode: Auditory Comprehension: 5-Understands complex 90% of the time/Cues < 10% of the time  Expression Expression Mode: Verbal Expression: 5-Expresses complex 90% of the time/cues < 10% of the time  Social Interaction Social Interaction: 5-Interacts appropriately 90% of the time - Needs monitoring or encouragement for participation or interaction.  Problem Solving Problem Solving: 4-Solves basic 75 - 89% of the time/requires cueing 10 - 24% of the time  Memory Memory: 4-Recognizes or recalls 75 - 89% of the time/requires cueing 10 - 24% of the time   Medical Problem List and Plan:  1. Functional deficits secondary to L2 compression fracture/radiculopathy status post fusion and arthrodesis 6/4. Lumbar corset when out of bed  2. DVT Prophylaxis/Anticoagulation: eliquis resumed 3. Pain Management: Hydrocodone and Robaxin as needed. Still thing this is referred pain from operation/incision. 4. History CVA 2013. eliquis resumed 5. Neuropsych: This patient is capable of making decisions on his own behalf.  6. Hypertension/atrial fibrillation. Amiodarone 100 mg once per day Saturday Sunday and 200 mg all other days, Lopressor 12.5 mg twice a day, hydrochlorothiazide 12.5 mg daily, lisinopril 10 mg daily.    7. BPH. Proscar 5 mg daily and Flomax 0.4 mg daily.    8. Hyperlipidemia. Zocor  9. GERD. Protonix  10.  Constipation: continue current regimen 11. Right neck nodule/pain---likely lymph node. No thyroid abnl or other pathology noted on u/s 12. Loose stool/FEN: recurrent soft stools last night  -no changes today---observe for consistent recurrence   -continue to push fluids  -bmet essentially normal today. Keep an eye on sodium  LOS (Days) 11 A FACE TO  FACE EVALUATION WAS PERFORMED  SWARTZ,ZACHARY T 10/28/2013 7:45 AM

## 2013-10-28 NOTE — Progress Notes (Signed)
Physical Therapy Session Note  Patient Details  Name: Mark Joseph MRN: [id number] Date of Birth: 06/05/1921  Today's Date: 10/28/2013 Time: [id number] Time Calculation (min): 33 min  Short Term Goals: Week 1:  PT Short Term Goal 1 (Week 1): Pt will perform bed mobility with min A PT Short Term Goal 1 - Progress (Week 1): Progressing toward goal PT Short Term Goal 2 (Week 1): Pt will perform functional transfers with min A PT Short Term Goal 2 - Progress (Week 1): Progressing toward goal Week 2:  PT Short Term Goal 1 (Week 2): = LTG of supervision overall for transfers and gait  Skilled Therapeutic Interventions/Progress Updates:   Pt resting in recliner with wife present.  Pt states this is the only therapy he is getting all weekend and he is very stiff from sitting in the recliner all day.  Pt required max A to stand from recliner and max A to safely pivot to w/c with RW secondary to pt maintaining bilat LE in knee/hip flexion, twisting from trunk and attempting to sit prior to fully pivoting.  Pt required total verbal cues to fully pivot feet to prevent twisting and to sit safely.  In gym pt transferred w/c <> Nustep with mod A with improved pivoting safety and sequence.  Performed Nustep x 8 minutes with bilat UE and LE at level 4 resistance for ROM, strengthening and endurance training + 2 minutes with LE only for extensor strengthening.  Performed gait training with RW simulating home environment stepping over low thresholds x 4 reps and side stepping to L and R through narrow space with mod A overall with max verbal and tactile cues for safe sequence stepping over low threshold and cues to maintain upright trunk to minimize weight through UE and allow pt to lift RW over threshold and when side stepping.  Continues to require tactile cues to prevent twisting.  Returned to room and performed transfer back to recliner mod A.    Therapy Documentation Precautions:  Precautions Precautions:  Back;Fall Required Braces or Orthoses: Spinal Brace Spinal Brace: Lumbar corset;Applied in sitting position Restrictions Weight Bearing Restrictions: No Vital Signs: Therapy Vitals Temp: 97.5 F (36.4 C) Temp src: Oral Pulse Rate: 67 Resp: 18 BP: 107/59 mmHg Patient Position (if appropriate): Sitting Oxygen Therapy SpO2: 97 % O2 Device: None (Room air) Pain: Pain Assessment Faces Pain Scale: Hurts little more Pain Type: Acute pain Pain Location: Hip Pain Orientation: Right Pain Descriptors / Indicators: Cramping Pain Onset: Sudden Pain Intervention(s): Repositioned;Rest Locomotion : Ambulation Ambulation/Gait Assistance: 3: Mod assist Wheelchair Mobility Distance: 100   See FIM for current functional status  Therapy/Group: Individual Therapy  Edman CircleHall, Audra [facility]trFaucette 10/28/2013, 2:49 PM

## 2013-10-29 DIAGNOSIS — I10 Essential (primary) hypertension: Secondary | ICD-10-CM

## 2013-10-29 DIAGNOSIS — I4891 Unspecified atrial fibrillation: Secondary | ICD-10-CM

## 2013-10-29 DIAGNOSIS — S32009A Unspecified fracture of unspecified lumbar vertebra, initial encounter for closed fracture: Secondary | ICD-10-CM

## 2013-10-29 DIAGNOSIS — R5381 Other malaise: Secondary | ICD-10-CM

## 2013-10-29 NOTE — Progress Notes (Signed)
[place] PHYSICAL MEDICINE & REHABILITATION     PROGRESS NOTE    Subjective/Complaints: Was doing better until last night when he had some soft semi-loose stools, mild left hip pain Review of Systems -otherwise neg  Objective: Vital Signs: Blood pressure 178/84, pulse 55, temperature 97.8 F (36.6 C), temperature source Oral, resp. rate 18, height 5\' 8"  (1.727 m), weight 68.357 kg (150 lb 11.2 oz), SpO2 95.00%. No results found. No results found for this basename: WBC, HGB, HCT, PLT,  in the last 72 hours  Recent Labs  10/27/13 0622  NA 131*  K 4.3  CL 91*  GLUCOSE 97  BUN 22  CREATININE 1.04  CALCIUM 9.3   CBG (last 3)  No results found for this basename: GLUCAP,  in the last 72 hours  Wt Readings from Last 3 Encounters:  10/25/13 68.357 kg (150 lb 11.2 oz)  10/06/13 67.586 kg (149 lb)  10/06/13 67.586 kg (149 lb)    Physical Exam:  Constitutional: He is oriented to person, place, and time. He appears well-developed.  HENT:  Head: Normocephalic.  Eyes: EOM are normal.  Neck: Normal range of motion. Soft, mobile nodule just above right thyroid--which is still tender Cardiovascular:  Cardiac rate controlled. No murmurs, rubs, gallops  Respiratory: Effort normal and breath sounds normal. No respiratory distress. No wheezes  GI: Soft. Bowel sounds are normal. He exhibits no distension. No tenderness Neurological: He is alert and oriented to person, place, and time.  UE's grossly 4/5 proximal to distal. RLE: 3- RHF, 2 RKE, 4/5 R ankles. LLE: 3+ HF, 3 KE and 4/5 ankles. No gross sensory deficits .   Skin:  Flank incision clean. No skin breakdown on buttock/low back Psychiatric: He has a normal mood and affect. His behavior is normal. Thought content normal    Assessment/Plan: 1. Functional deficits secondary to L2 compression fx, right L2 radiculopathy which require 3+ hours per day of interdisciplinary therapy in a comprehensive inpatient rehab  setting. Physiatrist is providing close team supervision and 24 hour management of active medical problems listed below. Physiatrist and rehab team continue to assess barriers to discharge/monitor patient progress toward functional and medical goals. FIM: FIM - Bathing Bathing Steps Patient Completed: Chest;Right Arm;Left Arm;Abdomen;Front perineal area;Right upper leg;Left upper leg Bathing: 4: Min-Patient completes 8-9 1971f 10 parts or 75+ percent  FIM - Upper Body Dressing/Undressing Upper body dressing/undressing steps patient completed: Thread/unthread right sleeve of pullover shirt/dresss;Thread/unthread left sleeve of pullover shirt/dress;Put head through opening of pull over shirt/dress Upper body dressing/undressing: 4: Min-Patient completed 75 plus % of tasks FIM - Lower Body Dressing/Undressing Lower body dressing/undressing steps patient completed: Thread/unthread right underwear leg;Pull underwear up/down;Thread/unthread right pants leg;Pull pants up/down;Fasten/unfasten pants;Don/Doff right sock;Don/Doff left sock;Don/Doff right shoe;Don/Doff left shoe Lower body dressing/undressing: 4: Min-Patient completed 75 plus % of tasks  FIM - Toileting Toileting steps completed by patient: Adjust clothing prior to toileting Toileting Assistive Devices: Grab bar or rail for support Toileting: 2: Max-Patient completed 1 of 3 steps  FIM - ArchivistToilet Transfers Toilet Transfers: 0-Activity did not occur  FIM - BankerBed/Chair Transfer Bed/Chair Transfer Assistive Devices: Therapist, occupationalWalker Bed/Chair Transfer: 2: Chair or W/C > Bed: Max A (lift and lower assist);2: Bed > Chair or W/C: Max A (lift and lower assist)  FIM - Locomotion: Wheelchair Distance: 100 Locomotion: Wheelchair: 1: Total Assistance/staff pushes wheelchair (Pt<25%) FIM - Locomotion: Ambulation Locomotion: Ambulation Assistive Devices: Designer, industrial/productWalker - Rolling Ambulation/Gait Assistance: 3: Mod assist Locomotion: Ambulation: 1: Travels less than 50  ft with moderate assistance (Pt: 50 - 74%)  Comprehension Comprehension Mode: Auditory Comprehension: 5-Understands complex 90% of the time/Cues < 10% of the time  Expression Expression Mode: Verbal Expression: 5-Expresses complex 90% of the time/cues < 10% of the time  Social Interaction Social Interaction: 5-Interacts appropriately 90% of the time - Needs monitoring or encouragement for participation or interaction.  Problem Solving Problem Solving: 4-Solves basic 75 - 89% of the time/requires cueing 10 - 24% of the time  Memory Memory: 4-Recognizes or recalls 75 - 89% of the time/requires cueing 10 - 24% of the time   Medical Problem List and Plan:  1. Functional deficits secondary to L2 compression fracture/radiculopathy status post fusion and arthrodesis 6/4. Lumbar corset when out of bed  2. DVT Prophylaxis/Anticoagulation: eliquis resumed 3. Pain Management: Hydrocodone and Robaxin as needed.   -encouraged him to use hydrocodone at night if pain keeping him up 4. History CVA 2013. eliquis resumed 5. Neuropsych: This patient is capable of making decisions on his own behalf.  6. Hypertension/atrial fibrillation. Amiodarone 100 mg once per day Saturday Sunday and 200 mg all other days, Lopressor 12.5 mg twice a day, hydrochlorothiazide 12.5 mg daily, lisinopril 10 mg daily.    7. BPH. Proscar 5 mg daily and Flomax 0.4 mg daily.    8. Hyperlipidemia. Zocor  9. GERD. Protonix  10.  Constipation: continue current regimen 11. Right neck nodule/pain---likely lymph node. No thyroid abnl or other pathology noted on u/s 12. Loose stool/FEN: no stools yesterday  -no changes today---observe for consistent recurrence   -continue to push fluids  -bmet essentially normal today. Keep an eye on sodium  LOS (Days) 12 A FACE TO FACE EVALUATION WAS PERFORMED  SWARTZ,ZACHARY T 10/29/2013 7:43 AM

## 2013-10-30 ENCOUNTER — Encounter (HOSPITAL_COMMUNITY): Payer: Medicare Other

## 2013-10-30 ENCOUNTER — Inpatient Hospital Stay (HOSPITAL_COMMUNITY): Payer: Medicare Other | Admitting: Physical Therapy

## 2013-10-30 ENCOUNTER — Encounter (HOSPITAL_COMMUNITY): Payer: Medicare Other | Admitting: Physical Therapy

## 2013-10-30 DIAGNOSIS — I10 Essential (primary) hypertension: Secondary | ICD-10-CM

## 2013-10-30 DIAGNOSIS — R5381 Other malaise: Secondary | ICD-10-CM

## 2013-10-30 DIAGNOSIS — S32009A Unspecified fracture of unspecified lumbar vertebra, initial encounter for closed fracture: Secondary | ICD-10-CM

## 2013-10-30 DIAGNOSIS — I4891 Unspecified atrial fibrillation: Secondary | ICD-10-CM

## 2013-10-30 NOTE — Progress Notes (Signed)
Reviewed and in agreement with treatment provided.  

## 2013-10-30 NOTE — Progress Notes (Signed)
Physical Therapy Note  Patient Details  Name: Mark Joseph MRN: [id number] Date of Birth: 04/27/1922 Today's Date: 10/30/2013  Time: 1300-1355 55 minutes  Group therapy:  Pt c/o back pain with sit to supine, relieved with positioning.  Pt participated in group therapy for LE exercises in supine and sidelying. Pt with noted R hip flexor tightness, performed passive stretching in pt tolerance.  Pt requires frequent rests due to fatigue, increased assist to mod/max A with transfers this session.   DONAWERTH,KAREN 10/30/2013, 1:50 PM

## 2013-10-30 NOTE — Progress Notes (Signed)
Occupational Therapy Session Note  Patient Details  Name: Ashok CroonLeon A Baker MRN: [id number] Date of Birth: 11/24/1921  Today's Date: 10/30/2013 Time: 1115-1200 Time Calculation (min): 45 min  Short Term Goals: Week 2:  OT Short Term Goal 1 (Week 2): STG=LTG  Skilled Therapeutic Interventions/Progress Updates: ADL-retraining with focus on family ed relating to home safety and selection of AE/DME for bathing/toileting and re-ed with caregiver on extent of assistance patient requires for ADL and transfers.   OT advised spouse of recommendation for seated bathing at sink at home as patient currently requires overall mod A and extra time with bathing and dressing, and she is unable to assist appropriately for transfers on/off toilet due to limited space in their step-in shower at home.   Spouse confirms that patient possesses a shower chair with arms and a bedside commode, which could be used over toilet as a safety frame, although they also have a raised toilet seat..   Patient reports dissatisfaction with BSC for toilet hygiene and then related complication to transfers in his bathroom at home due to presence of door and atypical bathroom configuration (shower, toilet, dual vanity).   OT requested images/photos to inspect as both patient and spouse were not in agreement relating to configuration of bathroom and related fixtures.   Plan to continue discussion although advocating for CNA from [facility] to assist with ADL at this time d/t spouse's frailty and confusion while assisting and directing patient with problem-solving.   Patient completed bathing this date but required overall max assist for dressing d/t time limitations and prolonged discussion on bathroom and equipment needs.     Therapy Documentation Precautions:  Precautions Precautions: Back;Fall Required Braces or Orthoses: Spinal Brace Spinal Brace: Lumbar corset;Applied in sitting position Restrictions Weight Bearing Restrictions:  No  Pain: Pain Assessment Pain Assessment: No/denies pain  See FIM for current functional status  Therapy/Group: Individual Therapy  BARTHOLD,FRANK 10/30/2013, 12:50 PM

## 2013-10-30 NOTE — Progress Notes (Signed)
Chaplain visited patient on morning rounds. Patient had just completed morning rehab. He seemed to be energized, and receptive to the brief visit. He said that he'd had back surgery and was beginning to walk again. Chaplain offered extended support to the patient during his stay.

## 2013-10-30 NOTE — ED Provider Notes (Signed)
Medical screening examination/treatment/procedure(s) were performed by non-physician practitioner and as supervising physician I was immediately available for consultation/collaboration.   EKG Interpretation None       Hurman HornJohn M Taym Twist, MD 10/30/13 1642

## 2013-10-30 NOTE — Progress Notes (Signed)
Physical Therapy Note  Patient Details  Name: Mark Joseph MRN: [id number] Date of Birth: 02/06/1922 Today's Date: 10/30/2013  Time 1: 0830 - 0930 60 minutes  1:1, c/o 3/10 pain in R low pack and hip, eases with rest.  Session focused on functional mobility, gait, family education, activity tolerance, LE strengthening, and standing balance.  Pt received sitting in w/c in room, lumbar corset donned but required tightening.  Ambulated 150' using RW with CGA for balance.  2 x 5 sit to stand with horseshoe activity, required min/mod A to stand with vc's for sequencing and UE placement.  Wife present for second half of session, educated on guarding and providing assistance for sit <> stand and ambulation using RW, required max cueing and repeated demonstrations, will require additional practice for safety.     Time 2: 1030 - 1100 30 minutes  1:1, c/o pain in R hip with mobility, eases with rest.  Session focused on gait, family education, activity tolerance, and functional mobility.  Wife present for session.  Pt propelled w/c using LE 2 x 150' with mod I.  Educated pt's wife on curb transfer using RW, pt independent with directing transfer, required max encouragement to demonstrate.  Sit <> stand with mod/max A, wife attempting to provide assistance and guarding, required vc's for positioning and PT lift assistance for safety.  Performed curb transfer with wife providing assistance, will require further practice for safety, wife voiced concern over her ability to provide assistance during practice.  Wife and pt expressed concern with LOC at home, discussed intermittent caregiver and need for additional assistance, recommended follow up with social work.  Alyson InglesKibiloski, Rena Hunke 10/30/2013, 12:26 PM

## 2013-10-30 NOTE — Progress Notes (Signed)
[place] PHYSICAL MEDICINE & REHABILITATION     PROGRESS NOTE    Subjective/Complaints: Had a good night. Pain controlled Review of Systems -otherwise neg  Objective: Vital Signs: Blood pressure 153/73, pulse 63, temperature 97.7 F (36.5 C), temperature source Oral, resp. rate 18, height 5\' 8"  (1.727 m), weight 68.357 kg (150 lb 11.2 oz), SpO2 96.00%. No results found. No results found for this basename: WBC, HGB, HCT, PLT,  in the last 72 hours No results found for this basename: NA, K, CL, CO, GLUCOSE, BUN, CREATININE, CALCIUM,  in the last 72 hours CBG (last 3)  No results found for this basename: GLUCAP,  in the last 72 hours  Wt Readings from Last 3 Encounters:  10/25/13 68.357 kg (150 lb 11.2 oz)  10/06/13 67.586 kg (149 lb)  10/06/13 67.586 kg (149 lb)    Physical Exam:  Constitutional: He is oriented to person, place, and time. He appears well-developed.  HENT:  Head: Normocephalic.  Eyes: EOM are normal.  Neck: Normal range of motion. Soft, mobile nodule just above right thyroid--which is still tender Cardiovascular:  Cardiac rate controlled. No murmurs, rubs, gallops  Respiratory: Effort normal and breath sounds normal. No respiratory distress. No wheezes  GI: Soft. Bowel sounds are normal. He exhibits no distension. No tenderness Neurological: He is alert and oriented to person, place, and time.  UE's grossly 4/5 proximal to distal. RLE: 3- RHF, 2 RKE, 4/5 R ankles. LLE: 3+ HF, 3 KE and 4/5 ankles. No gross sensory deficits .   Skin:  Flank incision clean. No skin breakdown on buttock/low back Psychiatric: He has a normal mood and affect. His behavior is normal. Thought content normal    Assessment/Plan: 1. Functional deficits secondary to L2 compression fx, right L2 radiculopathy which require 3+ hours per day of interdisciplinary therapy in a comprehensive inpatient rehab setting. Physiatrist is providing close team supervision and 24 hour management of  active medical problems listed below. Physiatrist and rehab team continue to assess barriers to discharge/monitor patient progress toward functional and medical goals. FIM: FIM - Bathing Bathing Steps Patient Completed: Chest;Right Arm;Left Arm;Abdomen;Front perineal area;Right upper leg;Left upper leg Bathing: 4: Min-Patient completes 8-9 3561f 10 parts or 75+ percent  FIM - Upper Body Dressing/Undressing Upper body dressing/undressing steps patient completed: Thread/unthread right sleeve of pullover shirt/dresss;Thread/unthread left sleeve of pullover shirt/dress;Put head through opening of pull over shirt/dress Upper body dressing/undressing: 4: Min-Patient completed 75 plus % of tasks FIM - Lower Body Dressing/Undressing Lower body dressing/undressing steps patient completed: Thread/unthread right underwear leg;Pull underwear up/down;Thread/unthread right pants leg;Pull pants up/down;Fasten/unfasten pants;Don/Doff right sock;Don/Doff left sock;Don/Doff right shoe;Don/Doff left shoe Lower body dressing/undressing: 4: Min-Patient completed 75 plus % of tasks  FIM - Toileting Toileting steps completed by patient: Adjust clothing prior to toileting Toileting Assistive Devices: Grab bar or rail for support Toileting: 2: Max-Patient completed 1 of 3 steps  FIM - ArchivistToilet Transfers Toilet Transfers: 0-Activity did not occur  FIM - BankerBed/Chair Transfer Bed/Chair Transfer Assistive Devices: Therapist, occupationalWalker Bed/Chair Transfer: 2: Chair or W/C > Bed: Max A (lift and lower assist);2: Bed > Chair or W/C: Max A (lift and lower assist)  FIM - Locomotion: Wheelchair Distance: 100 Locomotion: Wheelchair: 1: Total Assistance/staff pushes wheelchair (Pt<25%) FIM - Locomotion: Ambulation Locomotion: Ambulation Assistive Devices: Designer, industrial/productWalker - Rolling Ambulation/Gait Assistance: 3: Mod assist Locomotion: Ambulation: 1: Travels less than 50 ft with moderate assistance (Pt: 50 - 74%)  Comprehension Comprehension Mode:  Auditory Comprehension: 5-Understands complex 90% of the  time/Cues < 10% of the time  Expression Expression Mode: Verbal Expression: 5-Expresses complex 90% of the time/cues < 10% of the time  Social Interaction Social Interaction: 5-Interacts appropriately 90% of the time - Needs monitoring or encouragement for participation or interaction.  Problem Solving Problem Solving: 4-Solves basic 75 - 89% of the time/requires cueing 10 - 24% of the time  Memory Memory: 4-Recognizes or recalls 75 - 89% of the time/requires cueing 10 - 24% of the time   Medical Problem List and Plan:  1. Functional deficits secondary to L2 compression fracture/radiculopathy status post fusion and arthrodesis 6/4. Lumbar corset when out of bed  2. DVT Prophylaxis/Anticoagulation: eliquis resumed 3. Pain Management: Hydrocodone and Robaxin as needed.   4. History CVA 2013. eliquis resumed 5. Neuropsych: This patient is capable of making decisions on his own behalf.  6. Hypertension/atrial fibrillation. Amiodarone 100 mg once per day Saturday Sunday and 200 mg all other days, Lopressor 12.5 mg twice a day, hydrochlorothiazide 12.5 mg daily, lisinopril 10 mg daily.    7. BPH. Proscar 5 mg daily and Flomax 0.4 mg daily.   Mild hematuria=---observe 8. Hyperlipidemia. Zocor  9. GERD. Protonix  10.  Constipation: continue current regimen 11. Right neck nodule/pain---likely lymph node. No thyroid abnl or other pathology noted on u/s 12. Loose stool/FEN: no stools yesterday  -no changes today---observe for consistent recurrence   -continue to push fluids  -bmet essentially normal   LOS (Days) 13 A FACE TO FACE EVALUATION WAS PERFORMED  SWARTZ,ZACHARY T 10/30/2013 7:44 AM

## 2013-10-31 ENCOUNTER — Inpatient Hospital Stay (HOSPITAL_COMMUNITY): Payer: Medicare Other | Admitting: Physical Therapy

## 2013-10-31 ENCOUNTER — Encounter (HOSPITAL_COMMUNITY): Payer: Medicare Other

## 2013-10-31 ENCOUNTER — Inpatient Hospital Stay (HOSPITAL_COMMUNITY): Payer: Medicare Other | Admitting: *Deleted

## 2013-10-31 DIAGNOSIS — S32009A Unspecified fracture of unspecified lumbar vertebra, initial encounter for closed fracture: Secondary | ICD-10-CM

## 2013-10-31 DIAGNOSIS — I10 Essential (primary) hypertension: Secondary | ICD-10-CM

## 2013-10-31 DIAGNOSIS — I4891 Unspecified atrial fibrillation: Secondary | ICD-10-CM

## 2013-10-31 DIAGNOSIS — R5381 Other malaise: Secondary | ICD-10-CM

## 2013-10-31 NOTE — Progress Notes (Signed)
Reviewed and in agreement with treatment provided.  

## 2013-10-31 NOTE — Progress Notes (Signed)
Occupational Therapy Session Note  Patient Details  Name: Ashok CroonLeon A Macaraeg MRN: [id number] Date of Birth: 12/13/1921  Today's Date: 10/31/2013 Time: 1100-1200 Time Calculation (min): 60 min  Short Term Goals: Week 2:  OT Short Term Goal 1 (Week 2): STG=LTG  Skilled Therapeutic Interventions/Progress Updates: ADL-retraining with focus on family education with spouse on assisting patient with bathing/dressing and transfers.   Spouse assisted patient with sit<>stand from edge of bed, supervised his ambulation to shower, and assisted with lower body clothing management with verbal cues required to direct caregiver support.  Re-educated patient and spouse on plan to not attempt shower-level ADL at home and to maintain back precautions with use of back brace.   Spouse's ability to carry-over training is questionable as she demonstrates mild memory deficits; plan to provide written sequence of typical ADL tasks requiring her support prior to discharge.   Patient performed lower body dressing after min assist to lace left leg through underwear and pants.  Patient donned socks using sock aid (and powder), reacher, and long shoe horn, with extra time and verbal cues for problem-solving.       Therapy Documentation Precautions:  Precautions Precautions: Back;Fall Required Braces or Orthoses: Spinal Brace Spinal Brace: Lumbar corset;Applied in sitting position Restrictions Weight Bearing Restrictions: No  Vital Signs: Therapy Vitals Pulse Rate: 65 BP: 102/60 mmHg Patient Position (if appropriate): Sitting  Pain: No/denies pain   See FIM for current functional status  Therapy/Group: Individual Therapy  BARTHOLD,FRANK 10/31/2013, 12:21 PM

## 2013-10-31 NOTE — Progress Notes (Signed)
Physical Therapy Note  Patient Details  Name: Mark Joseph MRN: [id number] Date of Birth: 11/07/1921 Today's Date: 10/31/2013  Time: 0730 - 0825 55 minutes  1:1, c/o 3/10 pain in R hip with mobility, eases with rest.  Session focused on functional transfers, gait, w/c mobility, activity tolerance, and LE strengthening.  Pt received sitting in w/c in room with lumbar corset donned, required adjustment for fit.  Propelled w/c 100' in controlled environment and 100' in home environment using LE with mod I.  Car transfer with min A for sit <> stand.  Pt continues to demonstrate fluctuation with requirement of assistance throughout the day, min A/CGA during AM session for sit <> stand with B UE on RW, min A for lifting and stabilizing RW.  Multiple sit <> stand at various mat height, problem solving to increase ease of transfer with B UE support on RW.  Will require follow up during PM sessions for consistency.  Mark Joseph, Sarah 10/31/2013, 12:08 PM

## 2013-10-31 NOTE — Progress Notes (Signed)
Physical Therapy Session Note  Patient Details  Name: Mark Joseph MRN: [id number] Date of Birth: 03/02/1922  Today's Date: 10/31/2013 Time: 1335-1400 Time Calculation (min): 25 min  Short Term Goals: Week 2:  PT Short Term Goal 1 (Week 2): = LTG of supervision overall for transfers and gait  Skilled Therapeutic Interventions/Progress Updates:   Pt's wife present to continue education.  Pt reports trying simulated car this am but would like to try simulated car with wife.  Pt performed sit > stand from w/c with max A with extra time secondary to OA in bilat knees and back pain and feeling "stiff".  Performed ambulation x 100' with min A with frequent verbal and tactile cues to maintain upright posture and safe distance to RW.  Performed simulated car transfer with wife observing and providing hands on for stand pivot with RW and max A with assistance for controlled lowering to car and assistance to stand from low car.  Also educated wife on w/c break down and set up; wife does not feel she will be able to lift the w/c into and out of car but anticipates having assistance for that.  Pt performed w/c mobility with bilat feet propulsion x 100' with supervision to gym for OEG.  Pt and wife will need to practice actual car transfer.  Therapy Documentation Precautions:  Precautions Precautions: Back;Fall Required Braces or Orthoses: Spinal Brace Spinal Brace: Lumbar corset;Applied in sitting position Restrictions Weight Bearing Restrictions: No Vital Signs: Therapy Vitals Temp: 97.4 F (36.3 C) Temp src: Oral Pulse Rate: 68 Resp: 18 BP: 122/65 mmHg Patient Position (if appropriate): Sitting Oxygen Therapy SpO2: 97 % O2 Device: None (Room air) Pain: Pain Assessment Pain Assessment: No/denies pain Locomotion : Ambulation Ambulation/Gait Assistance: 4: Min assist Wheelchair Mobility Distance: 100   See FIM for current functional status  Therapy/Group: Individual Therapy  Edman CircleHall,  Aud[facility] 10/31/2013, 2:11 PM

## 2013-10-31 NOTE — Progress Notes (Addendum)
Physical Therapy Session Note  Patient Details  Name: Mark Joseph MRN: [id number] Date of Birth: 11/26/1921  Today's Date: 10/31/2013 Time: 1405-1500 Time Calculation (min): 55 min  Short Term Goals: Week 2:  PT Short Term Goal 1 (Week 2): = LTG of supervision overall for transfers and gait  Skilled Therapeutic Interventions/Progress Updates:  Pt participated in exercise group for strengthening, gait, stairs, and functional mobility. Pt needed up to Mod A for bed mobility for LE management and continues to need cues for technique.  Gait in controlled setting 1x120' with close S and cues for posture, which he is unable to adjust significantly.   Pt instructed in seated, supine, and standing therex for LE strengthening including each of the following 2x10 with cues for technique: ankle pumps, SAQ, marching, heel slides, and hip ABD.   Performed passive HS and heel cord stretching in seated and supine 2x671min each side for functional flexibility.  Pt engaged in Nustep for strengthening x10 min with bil LEs only at level 4 for Borg level "somewhat hard."   Pt performed sit<>stand transfers with RW and min/mod A when allowed to arise with bil UEs on RW. Pt needing more assistance when instructed in typical hand placement method.  Instructed pt in curb step x2 with min A overall and safe technique.  Wife again instructed in [facility], set-up and breakdown, but she still seems to have concerns.  Pt wanting to return to bed due to fatigue. Bed alarm on and all needs in reach.       Therapy Documentation Precautions:  Precautions Precautions: Back;Fall Required Braces or Orthoses: Spinal Brace Spinal Brace: Lumbar corset;Applied in sitting position Restrictions Weight Bearing Restrictions: No General:   Vital Signs: Therapy Vitals Temp: 97.4 F (36.3 C) Temp src: Oral Pulse Rate: 68 Resp: 18 BP: 122/65 mmHg Patient Position (if appropriate): Sitting Oxygen Therapy SpO2: 97  % O2 Device: None (Room air) Pain: occasional c/o R hip flexor pain, but relieved with repositioning.       Locomotion : Ambulation Ambulation/Gait Assistance: 5: Supervision Wheelchair Mobility Distance: 100  See FIM for current functional status  Therapy/Group: Group Therapy Clydene Lamingole Kampen, PT, DPT  10/31/2013, 3:21 PM

## 2013-10-31 NOTE — Progress Notes (Signed)
[facility]     PROGRESS NOTE    Subjective/Complaints: No issues overnight. Pain under control Review of Systems -otherwise neg  Objective: Vital Signs: Blood pressure 163/85, pulse 68, temperature 97.7 F (36.5 C), temperature source Oral, resp. rate 18, height 5\' 8"  (1.727 m), weight 68.357 kg (150 lb 11.2 oz), SpO2 96.00%. No results found. No results found for this basename: WBC, HGB, HCT, PLT,  in the last 72 hours No results found for this basename: NA, K, CL, CO, GLUCOSE, BUN, CREATININE, CALCIUM,  in the last 72 hours CBG (last 3)  No results found for this basename: GLUCAP,  in the last 72 hours  Wt Readings from Last 3 Encounters:  10/25/13 68.357 kg (150 lb 11.2 oz)  10/06/13 67.586 kg (149 lb)  10/06/13 67.586 kg (149 lb)    Physical Exam:  Constitutional: He is oriented to person, place, and time. He appears well-developed.  HENT:  Head: Normocephalic.  Eyes: EOM are normal.  Neck: Normal range of motion. Soft, mobile nodule just above right thyroid--which is still tender Cardiovascular:  Cardiac rate controlled. No murmurs, rubs, gallops  Respiratory: Effort normal and breath sounds normal. No respiratory distress. No wheezes  GI: Soft. Bowel sounds are normal. He exhibits no distension. No tenderness Neurological: He is alert and oriented to person, place, and time.  UE's grossly 4/5 proximal to distal. RLE: 3- RHF, 2 RKE, 4/5 R ankles. LLE: 3+ HF, 3 KE and 4/5 ankles. No gross sensory deficits .   Skin:  Flank incision clean. No skin breakdown on buttock/low back Psychiatric: He has a normal mood and affect. His behavior is normal. Thought content normal    Assessment/Plan: 1. Functional deficits secondary to L2 compression fx, right L2 radiculopathy which require 3+ hours per day of interdisciplinary therapy in a comprehensive inpatient rehab setting. Physiatrist is providing close team supervision and 24 hour management  of active medical problems listed below. Physiatrist and rehab team continue to assess barriers to discharge/monitor patient progress toward functional and medical goals. FIM: FIM - Bathing Bathing Steps Patient Completed: Chest;Right Arm;Left Arm;Abdomen;Front perineal area;Right upper leg;Left upper leg;Right lower leg (including foot);Left lower leg (including foot) Bathing: 4: Min-Patient completes 8-9 652f 10 parts or 75+ percent (using LH sponge)  FIM - Upper Body Dressing/Undressing Upper body dressing/undressing steps patient completed: Thread/unthread right sleeve of pullover shirt/dresss;Thread/unthread left sleeve of pullover shirt/dress;Put head through opening of pull over shirt/dress;Pull shirt over trunk;Thread/unthread right sleeve of front closure shirt/dress;Thread/unthread left sleeve of front closure shirt/dress;Pull shirt around back of front closure shirt/dress;Button/unbutton shirt Upper body dressing/undressing: 5: Set-up assist to: Apply TLSO, cervical collar FIM - Lower Body Dressing/Undressing Lower body dressing/undressing steps patient completed: Thread/unthread right underwear leg;Pull underwear up/down;Thread/unthread right pants leg;Pull pants up/down;Fasten/unfasten pants;Don/Doff right sock;Don/Doff left sock;Don/Doff right shoe;Don/Doff left shoe Lower body dressing/undressing: 1: Total-Patient completed less than 25% of tasks (assist required d/t time limitation)  FIM - Toileting Toileting steps completed by patient: Adjust clothing prior to toileting Toileting Assistive Devices: Grab bar or rail for support Toileting: 2: Max-Patient completed 1 of 3 steps  FIM - ArchivistToilet Transfers Toilet Transfers: 0-Activity did not occur  FIM - BankerBed/Chair Transfer Bed/Chair Transfer Assistive Devices: Bed rails;Arm rests;Orthosis Bed/Chair Transfer: 2: Chair or W/C > Bed: Max A (lift and lower assist)  FIM - Locomotion: Wheelchair Distance: 100 Locomotion: Wheelchair: 1:  Total Assistance/staff pushes wheelchair (Pt<25%) FIM - Locomotion: Ambulation Locomotion: Ambulation Assistive Devices: Designer, industrial/productWalker - Rolling Ambulation/Gait  Assistance: 3: Mod assist Locomotion: Ambulation: 1: Travels less than 50 ft with moderate assistance (Pt: 50 - 74%)  Comprehension Comprehension Mode: Auditory Comprehension: 5-Understands basic 90% of the time/requires cueing < 10% of the time  Expression Expression Mode: Verbal Expression: 5-Expresses basic needs/ideas: With no assist  Social Interaction Social Interaction: 5-Interacts appropriately 90% of the time - Needs monitoring or encouragement for participation or interaction.  Problem Solving Problem Solving: 4-Solves basic 75 - 89% of the time/requires cueing 10 - 24% of the time  Memory Memory: 4-Recognizes or recalls 75 - 89% of the time/requires cueing 10 - 24% of the time   Medical Problem List and Plan:  1. Functional deficits secondary to L2 compression fracture/radiculopathy status post fusion and arthrodesis 6/4. Lumbar corset when out of bed  2. DVT Prophylaxis/Anticoagulation: eliquis resumed 3. Pain Management: Hydrocodone and Robaxin as needed.   4. History CVA 2013. eliquis resumed 5. Neuropsych: This patient is capable of making decisions on his own behalf.  6. Hypertension/atrial fibrillation. Amiodarone 100 mg once per day Saturday Sunday and 200 mg all other days, Lopressor 12.5 mg twice a day, hydrochlorothiazide 12.5 mg daily, lisinopril 10 mg daily.    7. BPH. Proscar 5 mg daily and Flomax 0.4 mg daily.   Mild hematuria=---observe 8. Hyperlipidemia. Zocor  9. GERD. Protonix  10.  Constipation: continue current regimen 11. Right neck nodule/pain---likely lymph node. No thyroid abnl or other pathology noted on u/s 12. Loose stool/FEN: formed stool today  -no changes today---observe for consistent recurrence   -continue to push fluids  -bmet essentially normal   LOS (Days) 14 A FACE TO FACE  EVALUATION WAS PERFORMED  SWARTZ,ZACHARY T 10/31/2013 7:41 AM

## 2013-11-01 ENCOUNTER — Inpatient Hospital Stay (HOSPITAL_COMMUNITY): Payer: Medicare Other | Admitting: Occupational Therapy

## 2013-11-01 ENCOUNTER — Ambulatory Visit (HOSPITAL_COMMUNITY): Payer: Medicare Other | Admitting: Physical Therapy

## 2013-11-01 ENCOUNTER — Inpatient Hospital Stay (HOSPITAL_COMMUNITY): Payer: Medicare Other | Admitting: Physical Therapy

## 2013-11-01 NOTE — Discharge Summary (Signed)
Discharge summary job [id number]

## 2013-11-01 NOTE — Progress Notes (Signed)
Social Work Patient ID: Mark Joseph, male   DOB: 02/15/1922, 79 y.o.   MRN: [id number]  Met with pt and wife today to review team conference and concerns about d/c planning needs.  Pt and wife agree with team that pt's needs are greater than she can meet at home.  It seems they are both just coming to full realization of this despite team reviewing concerns daily with them.  Discussed option of SNF vs private duty and both are agreed they would like to change plan to SNF.  Have alerted tx team of change in d/c plan.  Will get FL2 out this afternoon.  HOYLE, LUCY, LCSW

## 2013-11-01 NOTE — Patient Care Conference (Signed)
Inpatient RehabilitationTeam Conference and Plan of Care Update Date: 10/31/2013   Time: 2:10 PM    Patient Name: Mark Joseph      Medical Record Number: [id number]  Date of Birth: 08/16/1921 Sex: Male         Room/Bed: 4M05C/4M05C-01 Payor Info: Payor: [employer] [place] MEDICARE / Plan: BLUE MEDICARE / Product Type: *No Product type* /    Admitting Diagnosis: LUMBAR FX  Admit Date/Time:  10/17/2013  4:35 PM Admission Comments: No comment available   Primary Diagnosis:  <principal problem not specified> Principal Problem: <principal problem not specified>  Patient Active Problem List   Diagnosis Date Noted  . Lumbar vertebral fracture 10/17/2013  . Fracture of lumbar spine 10/12/2013  . Compression fracture of L2 lumbar vertebra 10/06/2013  . Physical deconditioning 07/27/2013  . Atrial fibrillation with RVR 07/22/2013  . UTI (urinary tract infection) 10/12/2012  . S/P bilateral inguinal hernia repair 10/03/2012  . Leukocytosis 10/03/2012  . Hematoma 10/03/2012  . CVA (cerebral infarction) 01/30/2012  . Hypertension 01/30/2012    Expected Discharge Date: Expected Discharge Date: 11/02/13 (vs. SNF)  Team Members Present: Physician leading conference: Dr. Faith RogueZachary Swartz Social Worker Present: Amada JupiterLucy Hoyle, LCSW Nurse Present: Carlean PurlMaryann Barbour, RN PT Present: Edman CircleAudra Hall, PT OT Present: Donzetta KohutFrank Barthold, OT;Patricia Mat Carnelay, OT PPS Coordinator present : Tora DuckMarie Noel, RN, CRRN     Current Status/Progress Goal Weekly Team Focus  Medical   pain better. bowels more regulated  see prior  bowel habits, pain mgt   Bowel/Bladder   Foley in place for chronic urinary retention. Placed by Urologist prior to admission. Continent of bowel. LBM 10/29/13  Pt to remain continet of bowel  Monitor for regular bowel movement. Routine catheter care q shift   Swallow/Nutrition/ Hydration             ADL's   Mod A for transfers, supervision for upper body ADL, min A for lower body (with  extra time, using AE) per Homero FellersFrank B on 6/23.  supervision/min A (per Gwenyth OberFrank B on 6/23)  family ed, d/c planning (per Gwenyth OberFrank B on 6/23)   Mobility   mod-max A  mod A  d/c planning   Communication             Safety/Cognition/ Behavioral Observations            Pain   Pain controlled with Voltaren gel TID  <3  Monitor    Skin   Healed incision to L flank  No additional skin breakdown  Assess q shift    Rehab Goals Patient on target to meet rehab goals: No *See Care Plan and progress notes for long and short-term goals.  Barriers to Discharge: see prior    Possible Resolutions to Barriers:  assistance, adaptive techniques at home    Discharge Planning/Teaching Needs:  home with wife to provide 24/7 assistance.  Pt and wife decline option of SNF at this time.  ongoing with wife with primary focus on modifying activities for safety purposes.   Team Discussion:  Very minimal progress this week.  Can require as much as max assist when fatigue hits.  Plan to downgrade some PT goals to mod assist.  OT has modified how pt/ wife with approach bathing at home.  Still inconcsistent and team very concerned about wife's ability to provide adequate 24/7 care at home by herself.  Recommend private duty assist or reconsideration of SNF - SW to follow up  Revisions to  Treatment Plan:  Some goals downgraded or modified in some way Possible change in d/c plan to SNF if pt/ wife agree   Continued Need for Acute Rehabilitation Level of Care: The patient requires daily medical management by a physician with specialized training in physical medicine and rehabilitation for the following conditions: Daily direction of a multidisciplinary physical rehabilitation program to ensure safe treatment while eliciting the highest outcome that is of practical value to the patient.: Yes Daily medical management of patient stability for increased activity during participation in an intensive rehabilitation regime.:  Yes Daily analysis of laboratory values and/or radiology reports with any subsequent need for medication adjustment of medical intervention for : Post surgical problems;Neurological problems  HOYLE, LUCY 11/01/2013, 2:43 PM

## 2013-11-01 NOTE — Discharge Summary (Signed)
NAMDaralene Joseph:  Joseph, Mark                  ACCOUNT NO.:  [id number]  MEDICAL RECORD NO.:  [id number]  LOCATION:  4M05C                        FACILITY:  [facility]  PHYSICIAN:  Ranelle OysterZachary T. Joseph, M.D.DATE OF BIRTH:  Mar 06, 1922  DATE OF ADMISSION:  10/17/2013 DATE OF DISCHARGE:  11/02/2013                              DISCHARGE SUMMARY   DISCHARGE DIAGNOSES: 1. Functional deficits secondary to L2 compression fracture with     radiculopathy status post fusion, October 12, 2013. 2. Eliquis for DVT prophylaxis. 3. Pain management. 4. History of cerebrovascular accident in 2013. 5. Hypertension with atrial fibrillation. 6. Benign prostatic hypertrophy. 7. Hyperlipidemia. 8. Gastroesophageal reflux disease. 9. Constipation. 10.Decreased nutritional storage, improved.  HISTORY OF PRESENT ILLNESS:  This is an 80 year old right-handed male with history of CVA in September 2013 well-known to rehab services secondary to deconditioning related to atrial fibrillation as well as recent L2 compression fracture after a fall with conservative care.  He was discharged home from rehab services ambulating 200 feet with a rolling walker and minimal assistance.  Presented on Oct 06, 2013, with increasing back pain, urinary incontinence.  MRI of the lumbar spine showed severe compression and retropulsion of a known L2 compression fracture.  Underwent lumbar L2 polypectomy, lumbar L1-3 arthrodesis on October 12, 2013, per Dr. Franky Machoabbell.  Postoperative pain management.  Lumbar corset when out of bed.  Maintained on Eliquis for history of atrial fibrillation.  Physical and occupational therapy ongoing.  The patient was admitted for comprehensive rehab program.  PAST MEDICAL HISTORY:  See discharge diagnoses.  SOCIAL HISTORY:  Lives with spouse.  FUNCTIONAL HISTORY:  Prior to admission was independent with a rolling walker, [facility]  FUNCTIONAL STATUS:  Upon admission to rehab services was moderate  assist 6 feet with a rolling walker, max assist for bed mobility, min to mod assist for activities of daily living.  PHYSICAL EXAMINATION:  VITAL SIGNS:  Blood pressure 125/71, pulse 93, temperature 97.3, respirations 22. GENERAL:  This was an alert male, oriented x3.  Pupils were round and reactive to light. LUNGS:  Clear to auscultation. CARDIAC:  Rate controlled. ABDOMEN:  Soft, nontender.  Good bowel sounds. BACK:  Incision was dressed without drainage or odor.  REHABILITATION HOSPITAL COURSE:  The patient was admitted to inpatient rehab services with therapies initiated on a 3-hour daily basis consisting of physical therapy, occupational therapy, and rehabilitation nursing.  The following issues were addressed during the patient's rehabilitation stay.  Pertaining to Mark Joseph's functional deficits secondary to L2 compression fracture radiculopathy, he had undergone fusion, arthrodesis, back corset when out of bed, surgical site healing nicely.  He would follow up with Neurosurgery.  He remained on Eliquis for history of CVA as well as atrial fibrillation with cardiac rate controlled.  He would continue amiodarone.  He had no chest pain or shortness of breath.  Pain management with the use of hydrocodone, Robaxin, as well as Voltaren gel with good results.  Bouts of constipation resolved with laxative assistance.  He did have a history of benign prostatic hypertrophy as he remained on Proscar as well as Flomax with no hematuria noted.  The patient received  weekly collaborative interdisciplinary team conferences to discuss estimated length of stay, family teaching, and any barriers to discharge.  He was ambulating 120 feet close supervision in a controlled environment. Performed sit-to-stand transfers with a rolling walker, min mod assist. Moderate assist for bed mobility. Instructed on curb steps x2, minimal assist overall for safety techniques, activities of daily living.     Spouse assisted the patient with sit to stand from edge of bed, supervised, ambulation to the shower, assisted with lower body. Family was unable to provide further physical assistance with recommendations of skilled nursing facility and plan discharge once bed became available  DISCHARGE MEDICATIONS: 1. Amiodarone 100 mg on Saturday, Sunday, 200 mg daily; Monday,     Tuesday, Wednesday, Thursday, Friday. 2. Eliquis 5 mg p.o. b.i.d. 3. Os-Cal 1 p.o. b.i.d. 4. Voltaren gel 4 times daily to affected area. 5. Proscar 5 mg p.o. daily. 6. Hydrocodone 1 tablet every 4 hours as needed for moderate pain,     dispense of 90 tablets. 7. Lisinopril 10 mg p.o. daily. 8. Hydrochlorothiazide 12.5 mg p.o. daily. 9. Robaxin 500 mg p.o. every 6 hours as needed for muscle spasms. 10.Lopressor 12.5 mg p.o. b.i.d. 11.Protonix 40 mg p.o. daily. 12.Florastor 250 mg p.o. b.i.d. 13.Zocor 10 mg p.o. daily. 14.Flomax 0.4 mg p.o. daily.  DIET:  Regular.  SPECIAL INSTRUCTIONS:  Back brace when out of bed.  The patient to follow up with Mark Joseph at the outpatient rehab service office as needed, Mark Joseph in 2 weeks Neurosurgery, call for appointment; Mark Joseph, medical management.  Home health therapies had been arranged with physical and occupational therapy.     Mark Dollaraniel Joseph, P.A.   ______________________________ Ranelle OysterZachary T. Joseph, M.D.    DA/MEDQ  D:  11/01/2013  T:  11/01/2013  Job:  [id number]  cc:   Coletta MemosKyle Joseph, M.D. Thora LanceJohn J. Joseph, M.D.

## 2013-11-01 NOTE — Progress Notes (Signed)
Physical Therapy Note  Patient Details  Name: Mark CroonLeon A Capron MRN: [id number] Date of Birth: 11/04/1921 Today's Date: 11/01/2013  Time 1: 0815 - 0900 45 minutes  1:1, no c/o pain.  Pt received sitting in w/c in room, lumbar corset donned.  Propelled w/c 150' using LE with mod I.  Min A for sit <> stand, required increased cueing for problem solving and UE placement.  6 stairs with mod A , B handrails.  Ambulated 100' x 2 using RW with min A, cueing for posture.  Sit <> sidelying <> supine with mod A for B LE management, cueing to avoid twisting while log rolling.  Pt's wife present for last 15 minutes of session, education provided for cueing and assistance with bed mobility.  Sit <> stand using B UE support on RW with wife providing assistance from mat elevated to 27" to simulate pt's bed.  Ambulated 10' using RW, with wife providing assistance, pt's wife required cueing for safety while guarding during transfers.  Wife able to teach back skills during session however will require further reinforcement for carry over.   Time 2: 1100 - 1145 45 minutes  1:1, no c/o pain.  Session focused on family education with pt's wife for functional transfers, gait, car transfer, and curb transfer.  Sit <> stand from w/c with RW, wife providing assistance.  Pt's wife able to manage w/c leg rests independently during session for sit <> stand, follow up for consistency, required cueing for catheter and safety while guarding.  Pt ambulated 100' x 2 using RW with min A from wife.  Sit <> stand using RW with inconsistent UE placement, unable to stand with wife providing her maximal amount of assistance unless pt rests approx 3-5 minutes.  Car transfer with wife providing safe guarding during ambulation.  Curb and ramp transfer with wife providing assistance, will require further follow up, wife hesitant to guard while stepping onto curb.  Discussed home set up, safety at home, appropriate seating while at home (recommend to  avoid sitting on sofa), and the need for 24 hr A or SNF.  Wife able to problem solve and teach back skills learned, however unable to provide amount of required assistance as pt fatigued throughout the session.  SW to follow up with d/c planning as PT recommends d/c home with 24 hr A from caregiver or SNF.  Alyson InglesKibiloski, Sarah 11/01/2013, 12:23 PM

## 2013-11-01 NOTE — Progress Notes (Signed)
NUTRITION FOLLOW UP  Intervention:   -Encourage adequate intake of meals. Patient encouraged to request smaller portions and snacks if desired.  -RD will continue to monitor  Nutrition Dx:   Inadequate oral intake related to poor appetite as evidenced by pt report of poor appetite during current hospitalization; improving  Goal:   Patient will meet >/=90% of estimated nutrition needs, met  Monitor:   PO intake, supplement acceptance, weights, labs  Assessment:   Pt is s/p mechanical fall March 2015. Xray identified L2 compression fracture.Pt had prolonged stay in hospital and rehab in March 2015. Since discharge pt has been increasing in pain, walking, and urinary incontinence. Pt [facility] to [facility]. Pt admitted for pain and new onset atrial fibrillation. Admitted to CIR on 6/9.  Patient is now s/p [place] on 6/4.   6/10: Pt was very active, "Driving myself to the [facility] everyday" prior to this fall in March. Pt's appetite has been very good up until this hospitalization. States that he does not like the food very much and his appetite is not good. Encouraged pt's wife to bring in food from home or get a subway sandwich if pt will accept better than hospital food. Pt's wife stated that he was drinking Ensure at home. Pt's wife reports that he lost weight in March, but has recently gained most of it back. EPIC chart confirms this. Pt currently ordered for a Regular diet and eating 50-75% of meals.   6/17: Pt asleep at time of visit. Per nursing notes pt was consuming 25-75% of meals and the past 3 days intake has improved to 50-100% of meals. Per order history pt has been accepting Ensure Complete twice daily. Per weight history pt's weight has dropped 13 lbs this past week but, weight is about the same as 3 weeks ago. RD to continue to monitor.  Lunch tray at bedside at time of visit with about 80% consumed.   6/24 Patient with good intake,  eating 75-100% of his meals. Ensure Complete discontinued due to good intake. Will continue with this as long as intake of meals is good. Patient does report that portions of foods are too big. He states that prior to admission, he would eat 2 meals a day (light lunch, bigger dinner) with occasional snacks. Patient has discharge summary, but unsure when/where he will be discharged.   Height: Ht Readings from Last 1 Encounters:  10/17/13 _0  (1.727 m)    Weight Status:   Wt Readings from Last 1 Encounters:  11/01/13 1146 lb (519.822 kg)  ? 146 pounds  Re-estimated needs:  Kcal: 1600-1800 kcal Protein: 75-85 g Fluid: >/= 1.5 L/day  Skin: non-pitting RLE and LLE edema; closed incisions on back, hip, and abdomen  Diet Order: General   Intake/Output Summary (Last 24 hours) at 11/01/13 1304 Last data filed at 11/01/13 0800  Gross per 24 hour  Intake    480 ml  Output   2400 ml  Net  -1920 ml    Last BM: 6/24   Labs:   Recent Labs Lab 10/25/13 1540 10/27/13 0622  NA 132* 131*  K 4.5 4.3  CL 91* 91*  CO2 28 27  BUN 26* 22  CREATININE 1.05 1.04  CALCIUM 9.0 9.3  GLUCOSE 122* 97    CBG (last 3)  No results found for this basename: GLUCAP,  in the last 72 hours  Scheduled Meds: . amiodarone  100 mg Oral Once per day  on Sun Sat  . amiodarone  200 mg Oral Once per day on Mon Tue Wed Thu Fri  . apixaban  5 mg Oral BID  . calcium carbonate  1,250 mg Oral BID  . diclofenac sodium  2 g Topical QID  . finasteride  5 mg Oral Daily  . lisinopril  10 mg Oral Daily   And  . hydrochlorothiazide  12.5 mg Oral Daily  . metoprolol tartrate  12.5 mg Oral BID  . pantoprazole  40 mg Oral Daily  . saccharomyces boulardii  250 mg Oral BID  . simvastatin  10 mg Oral q1800  . tamsulosin  0.4 mg Oral QPC breakfast    Continuous Infusions:   Larey Seat, RD, LDN Pager #: [place] Pager #: [phone]

## 2013-11-01 NOTE — Progress Notes (Signed)
Occupational Therapy Session Notes  Patient Details  Name: Mark Joseph MRN: [id number] Date of Birth: [name]10/07/1921  Today's Date: 11/01/2013 Time: 0900-1000 and 200-230 Time Calculation (min): 60 min and 30 min  Short Term Goals: Week 2:  OT Short Term Goal 1 (Week 2): STG=LTG  Skilled Therapeutic Interventions/Progress Updates:  1)  Patient resting in w/c with wife present.  Engaged in patient-caregiver education to include allowing patient and wife to attempt to perform bathing and dressing as they would complete it at home.  There was much discussion and inconsistencies regarding whether to perform a shower versus a sponge bath, whether or not the w/c will fit through the bathroom, whether the walker will fit through the bathroom and if it does, they were not in agreement if patient needed to perform side step method or not.  Patient and wife required mod cues throughout session for problem solving, reminders that the primary therapist has discussed with them that they need to plan on a sponge bath [facility] if patient will be able to safely get in the small walk in shower and if there is room in the shower to use the shower chair with arms that wife has recently purchased, use of AE, back precautions, how to recognize that the back brace was out of alignment and needed to be adjusted before it was to be donned.  Reviewed my concerns related to plan to discharge home tomorrow.  Wife stated, "I just feel like if they had let me help him all along with this, then maybe we would be farther along".  Per chart and discussion with primary PT, patient/caregiver education has been the focus for several days.  2)  Patient sleeping in recliner upon arrival with wife at his side reading a book.  Wife stated, "I guess you heard that he is not going home tomorrow but instead he is going to a rehab center".  After discussion with patient and wife they seemed to be all right with the change in  discharge plan.  Engaged in toilet transfer and stand at sink to wash hands.  Focused session on caregiver education regarding sit>stand assist and vcs, reminding wife where to place her hands to assist and where to stand when providing assist during transitions.  Patient declined back to bed at this time so wife assisted patient back to recliner.  Reviewed concern regarding forward flexion during stand and ambulate secondary to the strain on back.  Patient reports that the front of his hips, thighs and abdomen hurt when he tries to stand up.  Reviewed need to stretch his hip flexors by laying as flat as he can tolerate in the bed for a short period to let gravity stretch him out.    Therapy Documentation Precautions:  Precautions Precautions: Back;Fall Required Braces or Orthoses: Spinal Brace Spinal Brace: Lumbar corset;Applied in sitting position Restrictions Weight Bearing Restrictions: No Pain: No c/o pain in either session  ADL: See FIM for current functional status  Therapy/Group: Individual Therapy both sessions  SHAFFER, CHRISTINA 11/01/2013, 10:17 AM

## 2013-11-01 NOTE — Progress Notes (Signed)
[place] PHYSICAL MEDICINE & REHABILITATION     PROGRESS NOTE    Subjective/Complaints: Had a good night. Activity tolerance a little better yesterday Review of Systems -otherwise neg  Objective: Vital Signs: Blood pressure 165/82, pulse 58, temperature 97.9 F (36.6 C), temperature source Oral, resp. rate 19, height 5\' 8"  (1.727 m), weight 68.357 kg (150 lb 11.2 oz), SpO2 96.00%. No results found. No results found for this basename: WBC, HGB, HCT, PLT,  in the last 72 hours No results found for this basename: NA, K, CL, CO, GLUCOSE, BUN, CREATININE, CALCIUM,  in the last 72 hours CBG (last 3)  No results found for this basename: GLUCAP,  in the last 72 hours  Wt Readings from Last 3 Encounters:  10/25/13 68.357 kg (150 lb 11.2 oz)  10/06/13 67.586 kg (149 lb)  10/06/13 67.586 kg (149 lb)    Physical Exam:  Constitutional: He is oriented to person, place, and time. He appears well-developed.  HENT:  Head: Normocephalic.  Eyes: EOM are normal.  Neck: Normal range of motion. Soft, mobile nodule just above right thyroid--which is still tender Cardiovascular:  Cardiac rate controlled. No murmurs, rubs, gallops  Respiratory: Effort normal and breath sounds normal. No respiratory distress. No wheezes  GI: Soft. Bowel sounds are normal. He exhibits no distension. No tenderness Neurological: He is alert and oriented to person, place, and time.  UE's grossly 4/5 proximal to distal. RLE: 3- RHF, 2 RKE, 4/5 R ankles. LLE: 3+ HF, 3 KE and 4/5 ankles. No gross sensory deficits .   Skin:  Flank incision clean. No skin breakdown on buttock/low back Psychiatric: He has a normal mood and affect. His behavior is normal. Thought content normal    Assessment/Plan: 1. Functional deficits secondary to L2 compression fx, right L2 radiculopathy which require 3+ hours per day of interdisciplinary therapy in a comprehensive inpatient rehab setting. Physiatrist is providing close team supervision  and 24 hour management of active medical problems listed below. Physiatrist and rehab team continue to assess barriers to discharge/monitor patient progress toward functional and medical goals.  Spoke with pt and wife. They continue to prefer going home. We have discussed potential pitfalls. Therapy/team will continue to work on safety/routine at home.   FIM: FIM - Bathing Bathing Steps Patient Completed: Chest;Right Arm;Left Arm;Abdomen;Front perineal area;Right upper leg;Left upper leg;Right lower leg (including foot);Left lower leg (including foot) Bathing: 4: Min-Patient completes 8-9 8160f 10 parts or 75+ percent  FIM - Upper Body Dressing/Undressing Upper body dressing/undressing steps patient completed: Thread/unthread right sleeve of pullover shirt/dresss;Thread/unthread left sleeve of pullover shirt/dress;Put head through opening of pull over shirt/dress;Pull shirt over trunk;Thread/unthread right sleeve of front closure shirt/dress;Thread/unthread left sleeve of front closure shirt/dress;Button/unbutton shirt;Pull shirt around back of front closure shirt/dress Upper body dressing/undressing: 5: Set-up assist to: Obtain clothing/put away FIM - Lower Body Dressing/Undressing Lower body dressing/undressing steps patient completed: Thread/unthread right underwear leg;Pull underwear up/down;Thread/unthread right pants leg;Pull pants up/down;Don/Doff right sock;Don/Doff left sock;Don/Doff right shoe;Don/Doff left shoe;Fasten/unfasten right shoe;Fasten/unfasten left shoe (using reacher, sock aid, shoe horn and elastic shoe laces) Lower body dressing/undressing: 4: Min-Patient completed 75 plus % of tasks  FIM - Toileting Toileting steps completed by patient: Adjust clothing prior to toileting Toileting Assistive Devices: Grab bar or rail for support Toileting: 2: Max-Patient completed 1 of 3 steps  FIM - ArchivistToilet Transfers Toilet Transfers: 0-Activity did not occur  FIM - Physiological scientistBed/Chair  Transfer Bed/Chair Transfer Assistive Devices: Arm rests;Walker Bed/Chair Transfer: 3: Sit > Supine:  Mod A (lifting assist/Pt. 50-74%/lift 2 legs);2: Chair or W/C > Bed: Max A (lift and lower assist)  FIM - Locomotion: Wheelchair Distance: 100 Locomotion: Wheelchair: 1: Total Assistance/staff pushes wheelchair (Pt<25%) FIM - Locomotion: Ambulation Locomotion: Ambulation Assistive Devices: Designer, industrial/productWalker - Rolling Ambulation/Gait Assistance: 5: Supervision Locomotion: Ambulation: 2: Travels 50 - 149 ft with supervision/safety issues  Comprehension Comprehension Mode: Auditory Comprehension: 4-Understands basic 75 - 89% of the time/requires cueing 10 - 24% of the time  Expression Expression Mode: Verbal Expression: 5-Expresses basic needs/ideas: With extra time/assistive device  Social Interaction Social Interaction: 5-Interacts appropriately 90% of the time - Needs monitoring or encouragement for participation or interaction.  Problem Solving Problem Solving: 4-Solves basic 75 - 89% of the time/requires cueing 10 - 24% of the time  Memory Memory: 4-Recognizes or recalls 75 - 89% of the time/requires cueing 10 - 24% of the time   Medical Problem List and Plan:  1. Functional deficits secondary to L2 compression fracture/radiculopathy status post fusion and arthrodesis 6/4. Lumbar corset when out of bed  2. DVT Prophylaxis/Anticoagulation: eliquis resumed 3. Pain Management: Hydrocodone and Robaxin as needed.   4. History CVA 2013. eliquis resumed 5. Neuropsych: This patient is capable of making decisions on his own behalf.  6. Hypertension/atrial fibrillation. Amiodarone 100 mg once per day Saturday Sunday and 200 mg all other days, Lopressor 12.5 mg twice a day, hydrochlorothiazide 12.5 mg daily, lisinopril 10 mg daily.    7. BPH. Proscar 5 mg daily and Flomax 0.4 mg daily.   Mild hematuria=---observe 8. Hyperlipidemia. Zocor  9. GERD. Protonix  10.  Constipation: continue current  regimen 11. Right neck nodule/pain---likely lymph node. No thyroid abnl or other pathology noted on u/s 12. Loose stool/FEN: formed stool today  -no changes today---observe for consistent recurrence   -continue to push fluids  -bmet essentially normal   LOS (Days) 15 A FACE TO FACE EVALUATION WAS PERFORMED  SWARTZ,ZACHARY T 11/01/2013 7:50 AM

## 2013-11-01 NOTE — Progress Notes (Signed)
Reviewed and in agreement with treatment provided.  

## 2013-11-02 ENCOUNTER — Inpatient Hospital Stay (HOSPITAL_COMMUNITY): Payer: Medicare Other

## 2013-11-02 ENCOUNTER — Inpatient Hospital Stay (HOSPITAL_COMMUNITY): Payer: Medicare Other | Admitting: Physical Therapy

## 2013-11-02 LAB — CBC WITH DIFFERENTIAL/PLATELET
BASOS ABS: 0 10*3/uL (ref 0.0–0.1)
Basophils Relative: 1 % (ref 0–1)
Eosinophils Absolute: 0.2 10*3/uL (ref 0.0–0.7)
Eosinophils Relative: 4 % (ref 0–5)
HEMATOCRIT: 28 % — AB (ref 39.0–52.0)
Hemoglobin: 9.7 g/dL — ABNORMAL LOW (ref 13.0–17.0)
LYMPHS PCT: 22 % (ref 12–46)
Lymphs Abs: 1.3 10*3/uL (ref 0.7–4.0)
MCH: 32 pg (ref 26.0–34.0)
MCHC: 34.6 g/dL (ref 30.0–36.0)
MCV: 92.4 fL (ref 78.0–100.0)
MONO ABS: 0.6 10*3/uL (ref 0.1–1.0)
Monocytes Relative: 10 % (ref 3–12)
NEUTROS ABS: 3.9 10*3/uL (ref 1.7–7.7)
NEUTROS PCT: 63 % (ref 43–77)
Platelets: 311 10*3/uL (ref 150–400)
RBC: 3.03 MIL/uL — ABNORMAL LOW (ref 4.22–5.81)
RDW: 13.6 % (ref 11.5–15.5)
WBC: 6.1 10*3/uL (ref 4.0–10.5)

## 2013-11-02 NOTE — Progress Notes (Signed)
[place] PHYSICAL MEDICINE & REHABILITATION     PROGRESS NOTE    Subjective/Complaints: Had his best night yet. Pain under reasonable control. Review of Systems -otherwise neg  Objective: Vital Signs: Blood pressure 154/75, pulse 61, temperature 98.1 F (36.7 C), temperature source Oral, resp. rate 18, height 5\' 8"  (1.727 m), weight 519.822 kg (1146 lb), SpO2 98.00%. No results found.  Recent Labs  11/02/13 0545  WBC 6.1  HGB 9.7*  HCT 28.0*  PLT 311   No results found for this basename: NA, K, CL, CO, GLUCOSE, BUN, CREATININE, CALCIUM,  in the last 72 hours CBG (last 3)  No results found for this basename: GLUCAP,  in the last 72 hours  Wt Readings from Last 3 Encounters:  11/01/13 519.822 kg (1146 lb)  10/06/13 67.586 kg (149 lb)  10/06/13 67.586 kg (149 lb)    Physical Exam:  Constitutional: He is oriented to person, place, and time. He appears well-developed.  HENT:  Head: Normocephalic.  Eyes: EOM are normal.  Neck: Normal range of motion. Soft, mobile nodule just above right thyroid--which is still tender Cardiovascular:  Cardiac rate controlled. No murmurs, rubs, gallops  Respiratory: Effort normal and breath sounds normal. No respiratory distress. No wheezes  GI: Soft. Bowel sounds are normal. He exhibits no distension. No tenderness Neurological: He is alert and oriented to person, place, and time.  UE's grossly 4/5 proximal to distal. RLE: 3- RHF, 2 RKE, 4/5 R ankles. LLE: 3+ HF, 3 KE and 4/5 ankles. No gross sensory deficits .   Skin:  Flank incision clean. healing Psychiatric: He has a normal mood and affect. His behavior is normal. Thought content normal    Assessment/Plan: 1. Functional deficits secondary to L2 compression fx, right L2 radiculopathy which require 3+ hours per day of interdisciplinary therapy in a comprehensive inpatient rehab setting. Physiatrist is providing close team supervision and 24 hour management of active medical problems  listed below. Physiatrist and rehab team continue to assess barriers to discharge/monitor patient progress toward functional and medical goals.  Pt and wife have now decided to pursue SNF given their/team's concerns.   FIM: FIM - Bathing Bathing Steps Patient Completed: Chest;Right Arm;Left Arm;Abdomen;Front perineal area;Right upper leg;Left upper leg;Right lower leg (including foot);Left lower leg (including foot) Bathing: 4: Min-Patient completes 8-9 2371f 10 parts or 75+ percent  FIM - Upper Body Dressing/Undressing Upper body dressing/undressing steps patient completed: Thread/unthread right sleeve of pullover shirt/dresss;Thread/unthread left sleeve of pullover shirt/dress;Put head through opening of pull over shirt/dress;Pull shirt over trunk Upper body dressing/undressing: 5: Set-up assist to: Apply TLSO, cervical collar FIM - Lower Body Dressing/Undressing Lower body dressing/undressing steps patient completed: Thread/unthread right underwear leg;Pull underwear up/down;Thread/unthread right pants leg;Pull pants up/down;Don/Doff right sock;Don/Doff left sock;Don/Doff right shoe;Don/Doff left shoe (using reacher, sock aid, shoe horn and elastic shoe laces) Lower body dressing/undressing: 4: Min-Patient completed 75 plus % of tasks  FIM - Toileting Toileting steps completed by patient: Adjust clothing prior to toileting Toileting Assistive Devices: Grab bar or rail for support Toileting: 2: Max-Patient completed 1 of 3 steps  FIM - ArchivistToilet Transfers Toilet Transfers: 0-Activity did not occur  FIM - BankerBed/Chair Transfer Bed/Chair Transfer Assistive Devices: Therapist, occupationalWalker Bed/Chair Transfer: 4: Supine > Sit: Min A (steadying Pt. > 75%/lift 1 leg);3: Sit > Supine: Mod A (lifting assist/Pt. 50-74%/lift 2 legs);3: Bed > Chair or W/C: Mod A (lift or lower assist);3: Chair or W/C > Bed: Mod A (lift or lower assist)  FIM - Locomotion:  Wheelchair Distance: 100 Locomotion: Wheelchair: 6: Travels 150  ft or more, turns around, maneuvers to table, bed or toilet, negotiates 3% grade: maneuvers on rugs and over door sills independently FIM - Locomotion: Ambulation Locomotion: Ambulation Assistive Devices: Designer, industrial/productWalker - Rolling Ambulation/Gait Assistance: 5: Supervision Locomotion: Ambulation: 4: Travels 150 ft or more with minimal assistance (Pt.>75%)  Comprehension Comprehension Mode: Auditory Comprehension: 4-Understands basic 75 - 89% of the time/requires cueing 10 - 24% of the time  Expression Expression Mode: Verbal Expression: 5-Expresses basic needs/ideas: With extra time/assistive device  Social Interaction Social Interaction: 5-Interacts appropriately 90% of the time - Needs monitoring or encouragement for participation or interaction.  Problem Solving Problem Solving: 4-Solves basic 75 - 89% of the time/requires cueing 10 - 24% of the time  Memory Memory: 4-Recognizes or recalls 75 - 89% of the time/requires cueing 10 - 24% of the time   Medical Problem List and Plan:  1. Functional deficits secondary to L2 compression fracture/radiculopathy status post fusion and arthrodesis 6/4. Lumbar corset when out of bed  2. DVT Prophylaxis/Anticoagulation: eliquis resumed 3. Pain Management: Hydrocodone and Robaxin as needed.   4. History CVA 2013. eliquis resumed 5. Neuropsych: This patient is capable of making decisions on his own behalf.  6. Hypertension/atrial fibrillation. Amiodarone 100 mg once per day Saturday Sunday and 200 mg all other days, Lopressor 12.5 mg twice a day, hydrochlorothiazide 12.5 mg daily, lisinopril 10 mg daily.    7. BPH. Proscar 5 mg daily and Flomax 0.4 mg daily.   Mild hematuria-resolved 8. Hyperlipidemia. Zocor  9. GERD. Protonix  10.  Constipation: continue current regimen 11. Right neck nodule/pain---likely lymph node. No thyroid abnl or other pathology noted on u/s 12. Loose stool/FEN: improved   -continue to push fluids  -bmet essentially normal    LOS (Days) 16 A FACE TO FACE EVALUATION WAS PERFORMED  SWARTZ,ZACHARY T 11/02/2013 7:56 AM

## 2013-11-02 NOTE — Progress Notes (Signed)
Occupational Therapy Session Note  Patient Details  Name: Mark Joseph MRN: [id number] Date of Birth: 04/27/1922  Today's Date: 11/02/2013 Time: 0830-0926 Time Calculation (min): 56 min  Short Term Goals: Week 2:  OT Short Term Goal 1 (Week 2): STG=LTG  Skilled Therapeutic Interventions/Progress Updates: ADL-retraining with focus on functional mobiltiy using RW, transfers (to/from shower seat), and continued AE training with reacher, sock aid, and shoe horn.   Patient received in his w/c, receptive for morning ADL.   [facility] with only min assist (pt = 90%) and ambulated to shower for seated ADL, observing back precautions as instructed with supervision.   Patient requires repeated reminders to remove his glasses and to not attempt standing w/o brace while seated on shower seat w/back.   Patient continues to require min assist to wash buttocks d/t inability to perform task effectively using lateral leans.   Patient assisted back to edge of bed to dress with sufficient time to allow continued practice with AE this session.   Patient requires assist only with lacing right pant legs due to presence of catheter bag.   Patient completed dressing with mod assist overall and transferred back to w/c at end of session with spouse attending to basic needs.    Therapy Documentation Precautions:  Precautions Precautions: Back;Fall Required Braces or Orthoses: Spinal Brace Spinal Brace: Lumbar corset;Applied in sitting position Restrictions Weight Bearing Restrictions: No  Vital Signs: Therapy Vitals Pulse Rate: 79 BP: 106/68 mmHg Patient Position (if appropriate): Sitting  Pain: Pain Assessment Pain Assessment: No/denies pain  See FIM for current functional status  Therapy/Group: Individual Therapy  Second session: Time: 1430-1500 Time Calculation (min):  30 min  Pain Assessment: 3/10, right hip  Skilled Therapeutic Interventions: Therapeutic exercises X15 min, using  NuStep, level 3, with mod assist to complete transfers.  Therapeutic activities with focus on family ed relating to assistance with sit<>stand and use of DME (adjusting hospital bed).       See FIM for current functional status  Therapy/Group: Individual Therapy  BARTHOLD,FRANK 11/02/2013, 9:27 AM

## 2013-11-02 NOTE — Progress Notes (Signed)
Physical Therapy Note  Patient Details  Name: Mark Joseph MRN: [id number] Date of Birth: 06/09/1921 Today's Date: 11/02/2013  Time: 1300 - 1345 45 minutes  1:1, no c/o pain.  Session focused on activity tolerance, LE strengthening, and gait.  Upon arrival, pt c/o lumbar corset feeling too high, doffed and adjusted at EOB.  Multiple sit <> stand with min/mod A.  Pt ambulated 150' x 2 and 350' using RW with CGA, multiple standing rest breaks, verbal and tactile cueing for upright posture.  NuStep, level 3 x 10 min, intermittent min A for LE management to correct slipping off footplates.  Pt demonstrated increased activity tolerance during PM session.  Alyson InglesKibiloski, Sarah 11/02/2013, 1:54 PM

## 2013-11-02 NOTE — Progress Notes (Signed)
Reviewed and in agreement with treatment provided.  

## 2013-11-03 ENCOUNTER — Inpatient Hospital Stay (HOSPITAL_COMMUNITY): Payer: Medicare Other

## 2013-11-03 ENCOUNTER — Inpatient Hospital Stay (HOSPITAL_COMMUNITY): Payer: Medicare Other | Admitting: Occupational Therapy

## 2013-11-03 ENCOUNTER — Inpatient Hospital Stay (HOSPITAL_COMMUNITY): Payer: Medicare Other | Admitting: Physical Therapy

## 2013-11-03 DIAGNOSIS — I10 Essential (primary) hypertension: Secondary | ICD-10-CM

## 2013-11-03 DIAGNOSIS — I4891 Unspecified atrial fibrillation: Secondary | ICD-10-CM

## 2013-11-03 DIAGNOSIS — R5381 Other malaise: Secondary | ICD-10-CM

## 2013-11-03 DIAGNOSIS — S32009A Unspecified fracture of unspecified lumbar vertebra, initial encounter for closed fracture: Secondary | ICD-10-CM

## 2013-11-03 NOTE — Progress Notes (Signed)
Occupational Therapy Session Note  Patient Details  Name: Ashok CroonLeon A Assefa MRN: [id number] Date of Birth: 02/05/1922  Today's Date: 11/03/2013 Time: [id number] Time Calculation (min): 25 min  Skilled Therapeutic Interventions/Progress Updates:  No direct complaints of pain Patient received seated in recliner. Patient stood with RW with moderate assistance and transferred > w/c. Patient propelled self from room > therapy gym using BLEs. Patient transferred w/c>NuStep machine. Therapist assisted with set-up for NuStep exercise and patient engaged in ~10 minutes of therapeutic exercise on NuStep to aid in increasing patient's overall functional strength & endurance to increase overall independence with functional mobility. Patient transferred back to w/c and propelled self back to room. Left patient seated in w/c with wife present and all needs within reach.   Precautions:  Precautions Precautions: Fall;Back Required Braces or Orthoses: Spinal Brace Spinal Brace: Lumbar corset;Applied in sitting position Restrictions Weight Bearing Restrictions: No  See FIM for current functional status  Therapy/Group: Individual Therapy  CLAY,PATRICIA 11/03/2013, 3:11 PM

## 2013-11-03 NOTE — Progress Notes (Signed)
Social Work Patient ID: Mark Joseph, male   DOB: 12/14/1921, 80 y.o.   MRN: [id number]  [facility]  [facility] and [facility]/c set for this afternoon.  Plan transfer via ambulance around 3:15.  Pt and wife very pleased and agreeable with all plans.  HOYLE, LUCY, LCSW

## 2013-11-03 NOTE — Progress Notes (Signed)
Occupational Therapy Discharge Summary  Patient Details  Name: Mark Joseph MRN: [id number] Date of Birth: 25-Oct-1921  Today's Date: 11/03/2013  Patient has met 7 of 8 long term goals due to improved activity tolerance, postural control, ability to compensate for deficits and improved coordination.  Patient to discharge at [facility] Assist level.  Patient's care partner unable to provide the necessary physical or cognitive assistance at discharge; patient discharged to SNF for continued rehab.    Reasons goals not met: inconsistent performance with lower body bathing; requests assist for improved thoroughness to clean buttocks; requires cues to not attempt standing w/o spinal brace.  Recommendation:  Patient will benefit from ongoing skilled OT services in skilled nursing facility setting to continue to advance functional skills in the area of BADL.  Equipment: No equipment provided  Reasons for discharge: discharge from hospital  Patient/family agrees with progress made and goals achieved: Yes  OT Discharge Precautions/Restrictions  Precautions Precautions: Fall;Back Required Braces or Orthoses: Spinal Brace Spinal Brace: Lumbar corset;Applied in sitting position Restrictions Weight Bearing Restrictions: No Vital Signs Therapy Vitals Temp: 97.6 F (36.4 C) Temp src: Oral Pulse Rate: 65 Resp: 18 BP: 97/60 mmHg Patient Position (if appropriate): Sitting Oxygen Therapy SpO2: 95 % O2 Device: None (Room air) Pain Pain Assessment Pain Assessment: No/denies pain ADL ADL ADL Comments: see FIM Vision/Perception  Vision- History Baseline Vision/History: Wears glasses Patient Visual Report: No change from baseline Vision- Assessment Vision Assessment?: No apparent visual deficits  Cognition Overall Cognitive Status: Within Functional Limits for tasks assessed Orientation Level: Oriented X4 Attention: Alternating Alternating Attention: Appears intact Memory:  Impaired Memory Impairment: Retrieval deficit Awareness: Appears intact Problem Solving: Impaired Problem Solving Impairment: Functional complex Safety/Judgment: Appears intact Sensation Sensation Light Touch: Appears Intact Stereognosis: Appears Intact Hot/Cold: Appears Intact Proprioception: Appears Intact Coordination Gross Motor Movements are Fluid and Coordinated: Yes Fine Motor Movements are Fluid and Coordinated: Yes Motor  Motor Motor - Skilled Clinical Observations: generalized weakness Motor - Discharge Observations: residual weakness specifically relating to sit-stand Mobility  Transfers Transfers: Sit to Stand;Stand to Sit Sit to Stand: 4: Min assist Sit to Stand Details: Verbal cues for precautions/safety;Manual facilitation for placement Stand to Sit: 4: Min guard  Trunk/Postural Assessment  Cervical Assessment Cervical Assessment: Exceptions to [facility] Cervical AROM Overall Cervical AROM Comments: forward flexed Thoracic AROM Overall Thoracic AROM Comments: forward flexed, limited ROM due to back precautions Lumbar Assessment Lumbar Assessment: Exceptions to [facility] Lumbar AROM Overall Lumbar AROM Comments: forward flexed, back precautions Postural Control Postural Control: Deficits on evaluation Postural Limitations: H/O forward flexed psoture  Balance Static Sitting Balance Static Sitting - Balance Support: Feet supported;No upper extremity supported Static Sitting - Level of Assistance: 6: Modified independent (Device/Increase time) Dynamic Sitting Balance Dynamic Sitting - Balance Support: Feet supported Dynamic Sitting - Level of Assistance: 6: Modified independent (Device/Increase time) Static Standing Balance Static Standing - Balance Support: Bilateral upper extremity supported Static Standing - Level of Assistance: 5: Stand by assistance Dynamic Standing Balance Dynamic Standing - Balance Support: During functional activity Dynamic Standing - Level  of Assistance: 4: Min assist;5: Stand by assistance Extremity/Trunk Assessment RUE Assessment RUE Assessment: Within Functional Limits LUE Assessment LUE Assessment: Within Functional Limits  See FIM for current functional status  BARTHOLD,FRANK 11/03/2013, 3:06 PM

## 2013-11-03 NOTE — Progress Notes (Signed)
Occupational Therapy Session Note  Patient Details  Name: Mark CroonLeon A Bogucki MRN: [id number] Date of Birth: 06/13/1921  Today's Date: 11/03/2013 Time: 0830-0927 Time Calculation (min): 57 min  Short Term Goals: Week 1:  OT Short Term Goal 1 (Week 1): STG=LTG  Skilled Therapeutic Interventions/Progress Updates:  ADL-retraining with focus on improved independence with use of AE during dressing.   Patient received in his w/c awaiting therapy.  Pt completed transfer and mobility to shower seat with no more than min assist (steadying) and completed seated shower unassisted for all but his buttocks which he cannot reach or cleanse thoroughly using long sponge or performing laterally leans.   Patient requires setup assist to apply/remove TLSO after bathing with standby assist to problem-solve during tasks.   Patient completes dressing at edge of bed although performance declines after shower d/t fatigue.   Patient attempted lacing catheter bag through his underwear and pants this date but noticed that his underwear was inverted after donning them up to his knees using reacher to lace feet through garment openings (min assist required to correct mishap).   Patient then donned socks and shoes unassisted using reacher, sock aid and shoe horn.   Patient waited for his wife to arrive before donning fresh button-down shirt at end of session.    Therapy Documentation Precautions:  Precautions Precautions: Back;Fall Required Braces or Orthoses: Spinal Brace Spinal Brace: Lumbar corset;Applied in sitting position Restrictions Weight Bearing Restrictions: No  Pain: Pain Assessment Pain Assessment: 0-10 Pain Score: 7  Pain Type: Acute pain Pain Location: Hip Pain Orientation: Lower;Right Pain Onset: With Activity Pain Intervention(s): Distraction;Repositioned Multiple Pain Sites: No  See FIM for current functional status  Therapy/Group: Individual Therapy  Second session: Time: [id number] Time  Calculation (min): 39 min Missed time: 6 min  Pain Assessment: No/denies  Skilled Therapeutic Interventions: Therapeutic activities with focus on family training with sit<>stand, transfers, functional mobility, and w/c management.   Patient received in his room with wife present.   OT provided training with wife on hand placement, verbal cues, safety awareness and attention to DME/environment of care.   Spouse able to assist with mobility and cues but was unable to demonstrate effective assist with sit<>stand during this session.   Patient provided feedback to sposue during session but demonstrates decline in alertness in afternoons and becomes aggravated by her assistance.   Near end of session patient was informed on bed status at SNF and session was terminated to allow preparations of personal effects in his room.  See FIM for current functional status  Therapy/Group: Individual Therapy  BARTHOLD,FRANK 11/03/2013, 9:27 AM

## 2013-11-03 NOTE — Progress Notes (Signed)
Pt was discharged to [facility]. [facility]

## 2013-11-03 NOTE — Progress Notes (Signed)
[place] PHYSICAL MEDICINE & REHABILITATION     PROGRESS NOTE    Subjective/Complaints: Another good night. Pain under control.  Review of Systems -otherwise neg  Objective: Vital Signs: Blood pressure 149/75, pulse 59, temperature 97.7 F (36.5 C), temperature source Oral, resp. rate 18, height 5\' 8"  (1.727 m), weight 519.822 kg (1146 lb), SpO2 97.00%. No results found.  Recent Labs  11/02/13 0545  WBC 6.1  HGB 9.7*  HCT 28.0*  PLT 311   No results found for this basename: NA, K, CL, CO, GLUCOSE, BUN, CREATININE, CALCIUM,  in the last 72 hours CBG (last 3)  No results found for this basename: GLUCAP,  in the last 72 hours  Wt Readings from Last 3 Encounters:  11/01/13 519.822 kg (1146 lb)  10/06/13 67.586 kg (149 lb)  10/06/13 67.586 kg (149 lb)    Physical Exam:  Constitutional: He is oriented to person, place, and time. He appears well-developed.  HENT:  Head: Normocephalic.  Eyes: EOM are normal.  Neck: Normal range of motion. Soft, mobile nodule just above right thyroid--which is still tender Cardiovascular:  Cardiac rate controlled. No murmurs, rubs, gallops  Respiratory: Effort normal and breath sounds normal. No respiratory distress. No wheezes  GI: Soft. Bowel sounds are normal. He exhibits no distension. No tenderness Neurological: He is alert and oriented to person, place, and time.  UE's grossly 4/5 proximal to distal. RLE: 3 RHF, 2+ RKE, 4/5 R ankles. LLE: 4 HF, 3+ KE and 4/5 ankles. No gross sensory deficits .   Skin:  Flank incision clean. healing Psychiatric: He has a normal mood and affect. His behavior is normal. Thought content normal    Assessment/Plan: 1. Functional deficits secondary to L2 compression fx, right L2 radiculopathy which require 3+ hours per day of interdisciplinary therapy in a comprehensive inpatient rehab setting. Physiatrist is providing close team supervision and 24 hour management of active medical problems listed  below. Physiatrist and rehab team continue to assess barriers to discharge/monitor patient progress toward functional and medical goals.  SNF pending   FIM: FIM - Bathing Bathing Steps Patient Completed: Chest;Right Arm;Left Arm;Abdomen;Front perineal area;Right upper leg;Left upper leg;Right lower leg (including foot);Left lower leg (including foot) Bathing: 4: Min-Patient completes 8-9 4115f 10 parts or 75+ percent  FIM - Upper Body Dressing/Undressing Upper body dressing/undressing steps patient completed: Thread/unthread right sleeve of pullover shirt/dresss;Thread/unthread left sleeve of pullover shirt/dress;Put head through opening of pull over shirt/dress;Thread/unthread right sleeve of front closure shirt/dress;Thread/unthread left sleeve of front closure shirt/dress;Pull shirt around back of front closure shirt/dress;Button/unbutton shirt Upper body dressing/undressing: 4: Min-Patient completed 75 plus % of tasks FIM - Lower Body Dressing/Undressing Lower body dressing/undressing steps patient completed: Thread/unthread right underwear leg;Pull underwear up/down;Thread/unthread right pants leg;Pull pants up/down;Don/Doff right sock;Don/Doff left sock;Don/Doff right shoe;Don/Doff left shoe;Fasten/unfasten right shoe;Fasten/unfasten left shoe (using reacher, sock aid, shoe horn, elastic laces) Lower body dressing/undressing: 4: Min-Patient completed 75 plus % of tasks  FIM - Toileting Toileting steps completed by patient: Adjust clothing prior to toileting Toileting Assistive Devices: Grab bar or rail for support Toileting: 2: Max-Patient completed 1 of 3 steps  FIM - ArchivistToilet Transfers Toilet Transfers: 0-Activity did not occur  FIM - BankerBed/Chair Transfer Bed/Chair Transfer Assistive Devices: Therapist, occupationalWalker Bed/Chair Transfer: 4: Supine > Sit: Min A (steadying Pt. > 75%/lift 1 leg);3: Sit > Supine: Mod A (lifting assist/Pt. 50-74%/lift 2 legs);3: Bed > Chair or W/C: Mod A (lift or lower  assist);3: Chair or W/C > Bed: Mod A (  lift or lower assist)  FIM - Locomotion: Wheelchair Distance: 100 Locomotion: Wheelchair: 6: Travels 150 ft or more, turns around, maneuvers to table, bed or toilet, negotiates 3% grade: maneuvers on rugs and over door sills independently FIM - Locomotion: Ambulation Locomotion: Ambulation Assistive Devices: Designer, industrial/productWalker - Rolling Ambulation/Gait Assistance: 5: Supervision Locomotion: Ambulation: 4: Travels 150 ft or more with minimal assistance (Pt.>75%)  Comprehension Comprehension Mode: Auditory Comprehension: 5-Understands complex 90% of the time/Cues < 10% of the time  Expression Expression Mode: Verbal Expression: 5-Expresses basic 90% of the time/requires cueing < 10% of the time.  Social Interaction Social Interaction: 6-Interacts appropriately with others with medication or extra time (anti-anxiety, antidepressant).  Problem Solving Problem Solving: 4-Solves basic 75 - 89% of the time/requires cueing 10 - 24% of the time  Memory Memory: 4-Recognizes or recalls 75 - 89% of the time/requires cueing 10 - 24% of the time   Medical Problem List and Plan:  1. Functional deficits secondary to L2 compression fracture/radiculopathy status post fusion and arthrodesis 6/4. Lumbar corset when out of bed  2. DVT Prophylaxis/Anticoagulation: eliquis resumed 3. Pain Management: Hydrocodone and Robaxin as needed.   4. History CVA 2013. eliquis resumed 5. Neuropsych: This patient is capable of making decisions on his own behalf.  6. Hypertension/atrial fibrillation. Amiodarone 100 mg once per day Saturday Sunday and 200 mg all other days, Lopressor 12.5 mg twice a day, hydrochlorothiazide 12.5 mg daily, lisinopril 10 mg daily.    7. BPH. Proscar 5 mg daily and Flomax 0.4 mg daily.   Mild hematuria still at times. 8. Hyperlipidemia. Zocor  9. GERD. Protonix  10.  Constipation: continue current regimen 11. Right neck nodule/pain---likely lymph node. No  thyroid abnl or other pathology noted on u/s 12. Loose stool/FEN: improved----needs bm today though   -continue to push fluids  -bmet essentially normal   LOS (Days) 17 A FACE TO FACE EVALUATION WAS PERFORMED  SWARTZ,ZACHARY T 11/03/2013 8:00 AM

## 2013-11-03 NOTE — Progress Notes (Signed)
Physical Therapy Note  Patient Details  Name: Mark Joseph MRN: [id number] Date of Birth: 01/17/1922 Today's Date: 11/03/2013  Time: 0930 - 1030 60 minutes  1:1, c/o pain in R hip with mobility, eases with rest, rest and repositioning as needed.  Session focused on functional transfers, activity tolerance, gait, and standing balance.  Pt participated in functional kitchen activities to make Jell-o.  Pt required cueing for energy conservation with sequencing and preparation.  Pt sit <> stand using RW and countertop, required min A to stand.  Able to stand with min A approx 3 min with 1 UE support on counter, max cueing for upright posture while standing at counter for functional activities.  Pt navigated kitchen using RW and w/c for energy conservation, min A for balance while walking.  Pt required min cueing to avoid twisting while reaching for supplies.  Pt required increased time for activity due to fatigue, multiple seated rest breaks throughout session.  Alyson InglesKibiloski, Sarah 11/03/2013, 10:54 AM

## 2013-11-03 NOTE — Progress Notes (Signed)
Reviewed and in agreement with treatment provided.  

## 2013-11-03 NOTE — Progress Notes (Signed)
Physical Therapy Weekly Progress Note  Patient Details  Name: Mark Joseph MRN: [id number] Date of Birth: 10-23-1921  Beginning of progress report period: October 25, 2013 End of progress report period: November 03, 2013  Today's Date: 11/03/2013    Patient has met 4 of 6 long term goals.  Pt continues to progress slowly, performed multiple days of pt/family ed with pt/wife.  Pt and wife have decided to pursue SNF placement due to pt requiring more care than wife can provide at this time. Pt continues to fluctuate between min and max A for transfers and mobility.  Patient continues to demonstrate the following deficits: impaired strength, activity tolerance and balance and therefore will continue to benefit from skilled PT intervention to enhance overall performance with activity tolerance, balance and ability to compensate for deficits.  See Patient's Care Plan for progression toward long term goals.  Patient not progressing toward long term goals.  See goal revision..  Plan of care revisions: d/c plan is now SNF.  Skilled Therapeutic Interventions/Progress Updates:  Ambulation/gait training;Balance/vestibular training;Community reintegration;Discharge planning;Functional mobility training;DME/adaptive equipment instruction;Pain management;Patient/family education;Splinting/orthotics;Stair training;Therapeutic Activities;Therapeutic Exercise;UE/LE Strength taining/ROM    See FIM for current functional status   DONAWERTH,KAREN 11/03/2013, 9:00 AM

## 2013-11-04 ENCOUNTER — Inpatient Hospital Stay (HOSPITAL_COMMUNITY): Payer: Medicare Other | Admitting: Occupational Therapy

## 2013-11-06 ENCOUNTER — Non-Acute Institutional Stay (SKILLED_NURSING_FACILITY): Payer: Medicare Other | Admitting: Adult Health

## 2013-11-06 ENCOUNTER — Encounter: Payer: Self-pay | Admitting: Adult Health

## 2013-11-06 DIAGNOSIS — K219 Gastro-esophageal reflux disease without esophagitis: Secondary | ICD-10-CM

## 2013-11-06 DIAGNOSIS — S32009A Unspecified fracture of unspecified lumbar vertebra, initial encounter for closed fracture: Secondary | ICD-10-CM

## 2013-11-06 DIAGNOSIS — N400 Enlarged prostate without lower urinary tract symptoms: Secondary | ICD-10-CM | POA: Insufficient documentation

## 2013-11-06 DIAGNOSIS — E785 Hyperlipidemia, unspecified: Secondary | ICD-10-CM | POA: Insufficient documentation

## 2013-11-06 DIAGNOSIS — I4891 Unspecified atrial fibrillation: Secondary | ICD-10-CM

## 2013-11-06 DIAGNOSIS — S32020A Wedge compression fracture of second lumbar vertebra, initial encounter for closed fracture: Secondary | ICD-10-CM

## 2013-11-06 DIAGNOSIS — I10 Essential (primary) hypertension: Secondary | ICD-10-CM

## 2013-11-06 NOTE — Progress Notes (Signed)
Patient ID: Mark CroonLeon A Wedig, male   DOB: 04/10/1922, 80 y.o.   MRN: [id number]               PROGRESS NOTE  DATE: 11/06/2013  [facility]  LEVEL OF CARE: SNF (31)  Acute Visit  CHIEF COMPLAINT:  Follow-up Hospitalization  HISTORY OF PRESENT ILLNESS: This is an 80 year old male who [facility] with L2 compression fracture with radiculopathy S/P Fusion. He has been admitted for a short-term rehabilitation.  REASSESSMENT OF ONGOING PROBLEM(S):  HTN: Pt 's HTN remains stable.  Denies CP, sob, DOE, pedal edema, headaches, dizziness or visual disturbances.  No complications from the medications currently being used.  Last BP : 110/66  ATRIAL FIBRILLATION: the patients atrial fibrillation remains stable.  The patient denies DOE, tachycardia, orthopnea, transient neurological sx, pedal edema, palpitations, & PNDs.  No complications noted from the medications currently being used.  GERD: pt's GERD is stable.  Denies ongoing heartburn, abd. Pain, nausea or vomiting.  Currently on a PPI & tolerates it without any adverse reactions.  PAST MEDICAL HISTORY : Reviewed.  No changes/see problem list  CURRENT MEDICATIONS: Reviewed per MAR/see medication list  REVIEW OF SYSTEMS:  GENERAL: no change in appetite, no fatigue, no weight changes, no fever, chills or weakness RESPIRATORY: no cough, SOB, DOE, wheezing, hemoptysis CARDIAC: no chest pain, edema or palpitations GI: no abdominal pain, diarrhea, constipation, heart burn, nausea or vomiting  PHYSICAL EXAMINATION  GENERAL: no acute distress, normal body habitus EYES: conjunctivae normal, sclerae normal, normal eye lids NECK: supple, trachea midline, no neck masses, no thyroid tenderness, no thyromegaly LYMPHATICS: no LAN in the neck, no supraclavicular LAN RESPIRATORY: breathing is even & unlabored, BS CTAB CARDIAC: irregularly  irregular, no murmur,no extra heart sounds, no edema GI: abdomen soft, normal BS, no masses, no tenderness, no hepatomegaly, no splenomegaly EXTREMITIES: able to move all 4 extremities PSYCHIATRIC: the patient is alert & oriented to person, affect & behavior appropriate  LABS/RADIOLOGY: Labs reviewed: Basic Metabolic Panel:  Recent Labs  40/98/1103/14/15 1821  10/24/13 1513 10/25/13 1540 10/27/13 0622  NA  --   < > 133* 132* 131*  K  --   < > 4.7 4.5 4.3  CL  --   < > 91* 91* 91*  CO2  --   < > 27 28 27   GLUCOSE  --   < > 112* 122* 97  BUN  --   < > 27* 26* 22  CREATININE  --   < > 1.09 1.05 1.04  CALCIUM  --   < > 8.8 9.0 9.3  MG 1.8  --   --   --   --   < > = values in this interval not displayed.  Liver Function Tests:  Recent Labs  07/28/13 0500 10/06/13 2025 10/18/13 0730  AST 16 25 26   ALT 16 19 22   ALKPHOS 61 61 56  BILITOT 0.4 0.4 0.5  PROT 6.0 7.0 5.9*  ALBUMIN 3.2* 3.9 2.9*   CBC:  Recent Labs  10/06/13 2025  10/18/13 0730 10/24/13 1513 11/02/13 0545  WBC 6.5  < > 6.5 9.1 6.1  NEUTROABS 4.2  --  4.0  --  3.9  HGB 12.3*  < > 8.7* 8.7* 9.7*  HCT 35.1*  < > 25.2* 24.9* 28.0*  MCV 92.1  < > 92.0 92.6 92.4  PLT 234  < >  241 373 311  < > = values in this interval not displayed.  Cardiac Enzymes:  Recent Labs  07/22/13 0923 07/22/13 1821 07/22/13 2224  TROPONINI <0.30 <0.30 <0.30     ASSESSMENT/PLAN:  L2 compression fracture with radiculopathy status post fusion - for rehabilitation; continue back belt when out of bed Hypertension - well controlled; continue Lopressor, lisinopril and HCTZ Atrial fibrillation - continue amiodarone, Lopressor and Eliquis BPH - continue Foley catheter until seen by urologist; continue Proscar and Flomax Hyperlipidemia - continue Zocor GERD - continue Protonix   CPT CODE: [id number]  Ella BodoMonina Vargas - NP [facility] [phone]

## 2013-11-06 NOTE — Progress Notes (Signed)
Social Work  Discharge Note  The overall goal for the admission was met for:   Discharge location: NO - plan changed to SNF due to limited improvement and wife unable to meet care needs.  Length of Stay: Yes - 17 days  Discharge activity level: Yes - minimal - mod assist overall  Home/community participation: No  Services provided included: MD, RD, PT, OT, RN, TR, Pharmacy and SW  Financial Services: Medicare Lavonda Jumbo)  Follow-up services arranged: Other: SNF @ [facility]  Comments (or additional information):  Patient/Family verbalized understanding of follow-up arrangements: Yes  Individual responsible for coordination of the follow-up plan: patient  Confirmed correct DME delivered: NA    HOYLE, LUCY

## 2013-11-06 NOTE — Progress Notes (Signed)
Physical Therapy Discharge Summary  Patient Details  Name: IGNAZIO KINCAID MRN: [id number] Date of Birth: 1922-03-14  Today's Date: 11/06/2013  Patient has met 5 of 5 long term goals due to improved activity tolerance, improved balance, increased strength and decreased pain.  Patient to discharge at an ambulatory level fluctuating between min and max A depending on pain and fatigue level..   Patient's care partner unable to provide the necessary physical assistance at discharge and pt and wife have decided to pursue SNF placement due to pt requiring more physical assistance than wife can provide at this time.   Reasons goals not met: All goals met at D/C but pt inconsistent with amount of assistance needed for functional mobility.  Recommendation:  Patient will benefit from ongoing skilled PT services in skilled nursing facility setting to continue to advance safe functional mobility, address ongoing impairments in impaired strength, activity tolerance and balance, and minimize fall risk to reach higher level of functional mobility independence prior to D/C home with wife.  Equipment: No equipment provided  Reasons for discharge: discharge from hospital  Patient/family agrees with progress made and goals achieved: Yes  See FIM for current functional status  Raylene Everts [facility] 11/06/2013, 8:32 AM

## 2013-11-09 ENCOUNTER — Encounter: Payer: Self-pay | Admitting: Adult Health

## 2013-11-09 ENCOUNTER — Non-Acute Institutional Stay (SKILLED_NURSING_FACILITY): Payer: Medicare Other | Admitting: Adult Health

## 2013-11-09 DIAGNOSIS — S32020A Wedge compression fracture of second lumbar vertebra, initial encounter for closed fracture: Secondary | ICD-10-CM

## 2013-11-09 DIAGNOSIS — K219 Gastro-esophageal reflux disease without esophagitis: Secondary | ICD-10-CM

## 2013-11-09 DIAGNOSIS — I4891 Unspecified atrial fibrillation: Secondary | ICD-10-CM

## 2013-11-09 DIAGNOSIS — N400 Enlarged prostate without lower urinary tract symptoms: Secondary | ICD-10-CM

## 2013-11-09 DIAGNOSIS — E785 Hyperlipidemia, unspecified: Secondary | ICD-10-CM

## 2013-11-09 DIAGNOSIS — S32009A Unspecified fracture of unspecified lumbar vertebra, initial encounter for closed fracture: Secondary | ICD-10-CM

## 2013-11-09 DIAGNOSIS — I10 Essential (primary) hypertension: Secondary | ICD-10-CM

## 2013-11-09 NOTE — Progress Notes (Signed)
Patient ID: Mark Joseph, male   DOB: 12/02/1921, 80 y.o.   MRN: [id number]              PROGRESS NOTE  DATE: 11/09/13  FACILITY: Nursing Home [facility]: [facility]  LEVEL OF CARE: SNF (31)  Acute Visit  CHIEF COMPLAINT:  Discharge Notes  HISTORY OF PRESENT ILLNESS: This is an 80 year old male who is for discharge home with Home health PT, OT and CNA. He has been [facility] 11/03/13 [facility] with L2 compression fracture with radiculopathy S/P Fusion. Patient was admitted to this facility for short-term rehabilitation after the patient's recent hospitalization.  Patient has completed SNF rehabilitation and therapy has cleared the patient for discharge.   REASSESSMENT OF ONGOING PROBLEM(S):  HTN: Pt 's HTN remains stable.  Denies CP, sob, DOE, pedal edema, headaches, dizziness or visual disturbances.  No complications from the medications currently being used.  Last BP : 111/64  BPH: The patient's BPH remains stable. Patient denies urinary hesitancy or dribbling. No complications reported from the current medications being used.  ATRIAL FIBRILLATION: the patients atrial fibrillation remains stable.  The patient denies DOE, tachycardia, orthopnea, transient neurological sx, pedal edema, palpitations, & PNDs.  No complications noted from the medications currently being used.   PAST MEDICAL HISTORY : Reviewed.  No changes/see problem list  CURRENT MEDICATIONS: Reviewed per MAR/see medication list  REVIEW OF SYSTEMS:  GENERAL: no change in appetite, no fatigue, no weight changes, no fever, chills or weakness RESPIRATORY: no cough, SOB, DOE, wheezing, hemoptysis CARDIAC: no chest pain, edema or palpitations GI: no abdominal pain, diarrhea, constipation, heart burn, nausea or vomiting  PHYSICAL EXAMINATION  GENERAL: no acute distress, normal body habitus NECK: supple, trachea midline, no neck masses, no thyroid tenderness, no  thyromegaly LYMPHATICS: no LAN in the neck, no supraclavicular LAN RESPIRATORY: breathing is even & unlabored, BS CTAB CARDIAC: irregularly irregular, no murmur,no extra heart sounds, no edema GI: abdomen soft, normal BS, no masses, no tenderness, no hepatomegaly, no splenomegaly EXTREMITIES: able to move all 4 extremities PSYCHIATRIC: the patient is alert & oriented to person, affect & behavior appropriate  LABS/RADIOLOGY: Labs reviewed: Basic Metabolic Panel:  Recent Labs  40/98/1103/14/15 1821  10/24/13 1513 10/25/13 1540 10/27/13 0622  NA  --   < > 133* 132* 131*  K  --   < > 4.7 4.5 4.3  CL  --   < > 91* 91* 91*  CO2  --   < > 27 28 27   GLUCOSE  --   < > 112* 122* 97  BUN  --   < > 27* 26* 22  CREATININE  --   < > 1.09 1.05 1.04  CALCIUM  --   < > 8.8 9.0 9.3  MG 1.8  --   --   --   --   < > = values in this interval not displayed.  Liver Function Tests:  Recent Labs  07/28/13 0500 10/06/13 2025 10/18/13 0730  AST 16 25 26   ALT 16 19 22   ALKPHOS 61 61 56  BILITOT 0.4 0.4 0.5  PROT 6.0 7.0 5.9*  ALBUMIN 3.2* 3.9 2.9*   CBC:  Recent Labs  10/06/13 2025  10/18/13 0730 10/24/13 1513 11/02/13 0545  WBC 6.5  < > 6.5 9.1 6.1  NEUTROABS 4.2  --  4.0  --  3.9  HGB 12.3*  < > 8.7* 8.7* 9.7*  HCT 35.1*  < >  25.2* 24.9* 28.0*  MCV 92.1  < > 92.0 92.6 92.4  PLT 234  < > 241 373 311  < > = values in this interval not displayed.  Cardiac Enzymes:  Recent Labs  07/22/13 0923 07/22/13 1821 07/22/13 2224  TROPONINI <0.30 <0.30 <0.30     ASSESSMENT/PLAN:  L2 compression fracture with radiculopathy status post fusion [facility]; follow-up with Dr. Cabbell/Neurosurgeon Hypertension - well controlled; continue Lopressor, lisinopril and HCTZ Atrial fibrillation - rate-controlled; continue amiodarone, Lopressor and Eliquis BPH - continue Foley catheter until seen by urologist; continue Proscar and Flomax Hyperlipidemia - continue Zocor GERD -  continue Protonix   I have filled out patient's discharge paperwork and written prescriptions.  Patient will receive home health PT, OT and CNA.  Total discharge time: Less than 30 minutes  Discharge time involved coordination of the discharge process with Child psychotherapistsocial worker, nursing staff and therapy department. Medical justification for home health services verified.   CPT CODE: [id number]   Ella BodoMonina Vargas - NP [facility] [phone]

## 2013-11-15 DIAGNOSIS — Z7901 Long term (current) use of anticoagulants: Secondary | ICD-10-CM

## 2013-11-15 DIAGNOSIS — Z5189 Encounter for other specified aftercare: Secondary | ICD-10-CM

## 2013-11-15 DIAGNOSIS — Z9181 History of falling: Secondary | ICD-10-CM

## 2013-11-15 DIAGNOSIS — IMO0001 Reserved for inherently not codable concepts without codable children: Secondary | ICD-10-CM

## 2013-11-21 ENCOUNTER — Other Ambulatory Visit: Payer: Self-pay | Admitting: Neurosurgery

## 2013-11-23 ENCOUNTER — Other Ambulatory Visit (HOSPITAL_COMMUNITY): Payer: Self-pay | Admitting: Neurosurgery

## 2013-11-23 DIAGNOSIS — IMO0002 Reserved for concepts with insufficient information to code with codable children: Secondary | ICD-10-CM

## 2013-11-24 ENCOUNTER — Other Ambulatory Visit (HOSPITAL_COMMUNITY): Payer: Self-pay | Admitting: Otolaryngology

## 2013-11-24 DIAGNOSIS — R1310 Dysphagia, unspecified: Secondary | ICD-10-CM

## 2013-11-28 ENCOUNTER — Ambulatory Visit (HOSPITAL_COMMUNITY)
Admission: RE | Admit: 2013-11-28 | Discharge: 2013-11-28 | Disposition: A | Discharge status: Home / Self Care | Payer: Medicare Other | Source: Ambulatory Visit | Attending: Neurosurgery | Admitting: Neurosurgery

## 2013-11-28 ENCOUNTER — Ambulatory Visit (HOSPITAL_COMMUNITY)
Admission: RE | Admit: 2013-11-28 | Discharge: 2013-11-28 | Disposition: A | Discharge status: Home / Self Care | Payer: Medicare Other | Source: Ambulatory Visit | Attending: Otolaryngology | Admitting: Otolaryngology

## 2013-11-28 ENCOUNTER — Other Ambulatory Visit (HOSPITAL_COMMUNITY): Payer: Self-pay | Admitting: Neurosurgery

## 2013-11-28 DIAGNOSIS — K5900 Constipation, unspecified: Secondary | ICD-10-CM | POA: Diagnosis not present

## 2013-11-28 DIAGNOSIS — R1310 Dysphagia, unspecified: Secondary | ICD-10-CM | POA: Diagnosis present

## 2013-11-28 DIAGNOSIS — M810 Age-related osteoporosis without current pathological fracture: Secondary | ICD-10-CM | POA: Diagnosis present

## 2013-11-28 DIAGNOSIS — IMO0002 Reserved for concepts with insufficient information to code with codable children: Secondary | ICD-10-CM

## 2013-11-28 DIAGNOSIS — N401 Enlarged prostate with lower urinary tract symptoms: Secondary | ICD-10-CM | POA: Diagnosis present

## 2013-11-28 DIAGNOSIS — R64 Cachexia: Secondary | ICD-10-CM | POA: Diagnosis present

## 2013-11-28 DIAGNOSIS — Z981 Arthrodesis status: Secondary | ICD-10-CM

## 2013-11-28 DIAGNOSIS — R339 Retention of urine, unspecified: Secondary | ICD-10-CM | POA: Diagnosis present

## 2013-11-28 DIAGNOSIS — M129 Arthropathy, unspecified: Secondary | ICD-10-CM | POA: Diagnosis present

## 2013-11-28 DIAGNOSIS — K219 Gastro-esophageal reflux disease without esophagitis: Secondary | ICD-10-CM | POA: Diagnosis present

## 2013-11-28 DIAGNOSIS — Y831 Surgical operation with implant of artificial internal device as the cause of abnormal reaction of the patient, or of later complication, without mention of misadventure at the time of the procedure: Secondary | ICD-10-CM | POA: Diagnosis present

## 2013-11-28 DIAGNOSIS — M47817 Spondylosis without myelopathy or radiculopathy, lumbosacral region: Secondary | ICD-10-CM

## 2013-11-28 DIAGNOSIS — Z7901 Long term (current) use of anticoagulants: Secondary | ICD-10-CM

## 2013-11-28 DIAGNOSIS — S32009A Unspecified fracture of unspecified lumbar vertebra, initial encounter for closed fracture: Secondary | ICD-10-CM | POA: Diagnosis present

## 2013-11-28 DIAGNOSIS — T84498A Other mechanical complication of other internal orthopedic devices, implants and grafts, initial encounter: Principal | ICD-10-CM | POA: Diagnosis present

## 2013-11-28 DIAGNOSIS — K224 Dyskinesia of esophagus: Secondary | ICD-10-CM | POA: Diagnosis present

## 2013-11-28 DIAGNOSIS — N138 Other obstructive and reflux uropathy: Secondary | ICD-10-CM | POA: Diagnosis present

## 2013-11-28 DIAGNOSIS — Z87891 Personal history of nicotine dependence: Secondary | ICD-10-CM

## 2013-11-28 DIAGNOSIS — I4891 Unspecified atrial fibrillation: Secondary | ICD-10-CM | POA: Diagnosis present

## 2013-11-28 DIAGNOSIS — I10 Essential (primary) hypertension: Secondary | ICD-10-CM | POA: Diagnosis present

## 2013-11-29 ENCOUNTER — Ambulatory Visit (HOSPITAL_COMMUNITY): Payer: Medicare Other

## 2013-11-29 ENCOUNTER — Ambulatory Visit (HOSPITAL_COMMUNITY)
Admission: RE | Admit: 2013-11-29 | Discharge: 2013-11-29 | Disposition: A | Discharge status: Home / Self Care | Payer: Medicare Other | Source: Ambulatory Visit | Attending: Otolaryngology | Admitting: Otolaryngology

## 2013-11-29 ENCOUNTER — Other Ambulatory Visit (HOSPITAL_COMMUNITY): Payer: Medicare Other

## 2013-11-29 ENCOUNTER — Encounter (HOSPITAL_COMMUNITY): Payer: Self-pay | Admitting: *Deleted

## 2013-11-29 DIAGNOSIS — R1310 Dysphagia, unspecified: Secondary | ICD-10-CM | POA: Insufficient documentation

## 2013-11-29 DIAGNOSIS — IMO0001 Reserved for inherently not codable concepts without codable children: Secondary | ICD-10-CM

## 2013-11-29 MED ORDER — CEFAZOLIN SODIUM-DEXTROSE 2-3 GM-% IV SOLR
2.0000 g | INTRAVENOUS | Status: AC
  Administered 2013-11-30: 2 g via INTRAVENOUS
  Filled 2013-11-29: qty 50

## 2013-11-29 MED ORDER — MUPIROCIN 2 % EX OINT
1.0000 "application " | TOPICAL_OINTMENT | Freq: Once | CUTANEOUS | Status: AC
  Administered 2013-11-30: 1 via TOPICAL
  Filled 2013-11-29: qty 22

## 2013-11-29 NOTE — Procedures (Signed)
Objective Swallowing Evaluation: Modified Barium Swallowing Study  Patient Details  Name: Mark Joseph MRN: [id number] Date of Birth: 01-Jan-1922  Today's Date: 11/29/2013 Time: 7829-5621 SLP Time Calculation (min): 20 min  Past Medical History:  Past Medical History  Diagnosis Date  . Hypertension   . GERD (gastroesophageal reflux disease)   . Osteoporosis   . High cholesterol   . Stroke 01/2012    "small; affected his speech; pretty much back to normal now" (09/15/2012)  . Pneumonitis 1971  . Arthritis     "knees; right is worse" (09/15/2012)   Past Surgical History:  Past Surgical History  Procedure Laterality Date  . Appendectomy  1953  . Inguinal hernia repair Bilateral 09/15/2012    w/mesh Hattie Perch 09/15/2012  . Inguinal hernia repair Bilateral 09/15/2012    Procedure: HERNIA REPAIR INGUINAL ADULT BILATERAL;  Surgeon: Emelia Loron, MD;  Location: [facility] OR;  Service: General;  Laterality: Bilateral;  . Insertion of mesh Bilateral 09/15/2012    Procedure: INSERTION OF MESH;  Surgeon: Emelia Loron, MD;  Location: [facility] OR;  Service: General;  Laterality: Bilateral;  . Anterior lat lumbar fusion Left 10/12/2013    Procedure: LUMBAR TWO CORPECTOMY,LUMBAR ONE-LUMBAR THREE ARTHRODESIS.;  Surgeon: Carmela Hurt, MD;  Location: [facility] ORS;  Service: Neurosurgery;  Laterality: Left;   HPI:  Mark Joseph is a 80 y.o. right-handed male with history of CVA September 2013 with recent hospitalization/rehab stay and back surgery L2 compression fracture after a fall.  Pt for swallow evaluation per Dr Emeline Darling due to pt's symptoms of dysphagia.  Pt reports he had some gagging when trying to eat when taking large bites during his hospital stay that resolved.  He has experienced some weight loss which he attributes to lack of appetite.  No pulmonary infections noted nor has pt required heimlich maneuver.       Assessment / Plan / Recommendation Clinical Impression  Dysphagia Diagnosis: Within Functional  Limits  Clinical impression: Pt presents an oropharyngeal swallow ability that is within functional limits.  There was no aspiration or frank penetration with any consistency tested (thin, nectar, pudding, cracker). Oral transiting was timely  - though pt takes small bites/sips which is likely accommodating his suspected esophageal dysphgia.  SLP refrained from testing barium tablet as pt is for esophagram immediately following this test.   Suspect pt's symptoms are consistent with esophageal deficits as pt appeared with poor clearance of esophagus without sensation.  Thin water given to faciliate clearance for esophagram, radiologist not present Educated pt/spouse to findings.      Treatment Recommendation  No treatment recommended at this time    Diet Recommendation Regular;Thin liquid   Liquid Administration via: Cup;Straw Medication Administration: Whole meds with liquid Supervision: Patient able to self feed Compensations: Slow rate;Small sips/bites Postural Changes and/or Swallow Maneuvers: Seated upright 90 degrees;Upright 30-60 min after meal      General Date of Onset: 11/29/13 HPI: Mark Joseph is a 80 y.o. right-handed male with history of CVA September 2013 with recent hospitalization/rehab stay and back surgery L2 compression fracture after a fall.  Pt for swallow evaluation per Dr Emeline Darling due to pt's symptoms of dysphagia.  Pt reports he had some gagging when trying to eat when taking large bites during his hospital stay that resolved.  He has experienced some weight loss which he attributes to lack of appetite.  No pulmonary infections noted nor has pt required heimlich maneuver.   Type of Study: Modified Barium Swallowing  Study Reason for Referral: Objectively evaluate swallowing function Diet Prior to this Study: Regular;Thin liquids Temperature Spikes Noted: No Respiratory Status: Room air History of Recent Intubation: No Behavior/Cognition: Alert;Cooperative;Pleasant  mood;Hard of hearing Oral Cavity - Dentition: Adequate natural dentition Oral Motor / Sensory Function: Within functional limits Self-Feeding Abilities: Able to feed self Patient Positioning: Upright in chair Baseline Vocal Quality: Low vocal intensity;Clear (pt reports voice gets weaker as day progresses) Volitional Cough: Strong Volitional Swallow: Able to elicit Anatomy: Within functional limits Pharyngeal Secretions: Not observed secondary MBS    Reason for Referral Objectively evaluate swallowing function   Oral Phase Oral Preparation/Oral Phase Oral Phase: WFL   Pharyngeal Phase Pharyngeal Phase Pharyngeal Phase: Within functional limits  Cervical Esophageal Phase    GO    Cervical Esophageal Phase Cervical Esophageal Phase: Impaired Cervical Esophageal Phase - Comment Cervical Esophageal Comment: appeared with poor clearance of esophagus without sensation, thin water given to faciliate clearance to facilitate esophagram, radiologist not present     Functional Assessment Tool Used: mbs, clinical judgement Functional Limitations: Swallowing Swallow Current Status ([id number]): At least 1 percent but less than 20 percent impaired, limited or restricted Swallow Goal Status [phone]): At least 1 percent but less than 20 percent impaired, limited or restricted Swallow Discharge Status [phone]([phone]): At least 1 percent but less than 20 percent impaired, limited or restricted    Donavan Burnetamara Cathlin Buchan, MS [facility] SLP [phone]

## 2013-11-29 NOTE — Progress Notes (Signed)
Pt's wife states that pt has had an urinary catheter in for several weeks to a couple of months and she doesn't think the catheter has ever been changed. States the bag has been changed a few times because of leaks, but she is not aware of a catheter change.  Pt is being seen by Dr. Emeline DarlingGore for dysphagia, had barium swallow done today and does have some type of esophagus problem, wife wasn't sure exactly what Dr. Emeline DarlingGore said it was. She states Dr. Emeline DarlingGore told her that he would be referring pt to a GI physician.  Pt stopped Eliquis 11/24/14 (last dose).

## 2013-11-29 NOTE — OR Nursing (Signed)
Late Entry addendum added to procedure reporting.

## 2013-11-30 ENCOUNTER — Encounter (HOSPITAL_COMMUNITY)
Admission: RE | Disposition: A | Discharge status: Skilled Nursing Facility | Payer: Self-pay | Source: Ambulatory Visit | Attending: Neurosurgery

## 2013-11-30 ENCOUNTER — Encounter (HOSPITAL_COMMUNITY): Payer: Self-pay | Admitting: *Deleted

## 2013-11-30 ENCOUNTER — Inpatient Hospital Stay (HOSPITAL_COMMUNITY): Payer: Medicare Other

## 2013-11-30 ENCOUNTER — Inpatient Hospital Stay (HOSPITAL_COMMUNITY): Payer: Medicare Other | Admitting: Certified Registered Nurse Anesthetist

## 2013-11-30 ENCOUNTER — Inpatient Hospital Stay (HOSPITAL_COMMUNITY)
Admission: RE | Admit: 2013-11-30 | Discharge: 2013-12-11 | DRG: 460 | Disposition: A | Discharge status: Skilled Nursing Facility | Payer: Medicare Other | Source: Ambulatory Visit | Attending: Neurosurgery | Admitting: Neurosurgery

## 2013-11-30 ENCOUNTER — Encounter (HOSPITAL_COMMUNITY): Payer: Medicare Other | Admitting: Certified Registered Nurse Anesthetist

## 2013-11-30 ENCOUNTER — Encounter (HOSPITAL_COMMUNITY): Payer: Self-pay | Admitting: Pharmacy Technician

## 2013-11-30 DIAGNOSIS — K5900 Constipation, unspecified: Secondary | ICD-10-CM | POA: Diagnosis not present

## 2013-11-30 DIAGNOSIS — I4891 Unspecified atrial fibrillation: Secondary | ICD-10-CM | POA: Diagnosis present

## 2013-11-30 DIAGNOSIS — R1310 Dysphagia, unspecified: Secondary | ICD-10-CM | POA: Diagnosis present

## 2013-11-30 DIAGNOSIS — S32020A Wedge compression fracture of second lumbar vertebra, initial encounter for closed fracture: Secondary | ICD-10-CM

## 2013-11-30 DIAGNOSIS — Z7901 Long term (current) use of anticoagulants: Secondary | ICD-10-CM | POA: Diagnosis not present

## 2013-11-30 DIAGNOSIS — M129 Arthropathy, unspecified: Secondary | ICD-10-CM | POA: Diagnosis present

## 2013-11-30 DIAGNOSIS — N401 Enlarged prostate with lower urinary tract symptoms: Secondary | ICD-10-CM | POA: Diagnosis present

## 2013-11-30 DIAGNOSIS — M549 Dorsalgia, unspecified: Secondary | ICD-10-CM | POA: Diagnosis present

## 2013-11-30 DIAGNOSIS — R64 Cachexia: Secondary | ICD-10-CM | POA: Diagnosis present

## 2013-11-30 DIAGNOSIS — K224 Dyskinesia of esophagus: Secondary | ICD-10-CM | POA: Diagnosis present

## 2013-11-30 DIAGNOSIS — I10 Essential (primary) hypertension: Secondary | ICD-10-CM | POA: Diagnosis present

## 2013-11-30 DIAGNOSIS — S32009A Unspecified fracture of unspecified lumbar vertebra, initial encounter for closed fracture: Secondary | ICD-10-CM | POA: Diagnosis present

## 2013-11-30 DIAGNOSIS — Z981 Arthrodesis status: Secondary | ICD-10-CM | POA: Diagnosis not present

## 2013-11-30 DIAGNOSIS — Z87891 Personal history of nicotine dependence: Secondary | ICD-10-CM | POA: Diagnosis not present

## 2013-11-30 DIAGNOSIS — M810 Age-related osteoporosis without current pathological fracture: Secondary | ICD-10-CM | POA: Diagnosis present

## 2013-11-30 DIAGNOSIS — R339 Retention of urine, unspecified: Secondary | ICD-10-CM | POA: Diagnosis present

## 2013-11-30 DIAGNOSIS — Y831 Surgical operation with implant of artificial internal device as the cause of abnormal reaction of the patient, or of later complication, without mention of misadventure at the time of the procedure: Secondary | ICD-10-CM | POA: Diagnosis present

## 2013-11-30 DIAGNOSIS — IMO0002 Reserved for concepts with insufficient information to code with codable children: Secondary | ICD-10-CM | POA: Diagnosis not present

## 2013-11-30 DIAGNOSIS — K219 Gastro-esophageal reflux disease without esophagitis: Secondary | ICD-10-CM | POA: Diagnosis present

## 2013-11-30 DIAGNOSIS — T84498A Other mechanical complication of other internal orthopedic devices, implants and grafts, initial encounter: Secondary | ICD-10-CM | POA: Diagnosis present

## 2013-11-30 HISTORY — DX: Dysphagia, unspecified: R13.10

## 2013-11-30 HISTORY — DX: Unspecified atrial fibrillation: I48.91

## 2013-11-30 LAB — CBC
HCT: 34.1 % — ABNORMAL LOW (ref 39.0–52.0)
HEMOGLOBIN: 11.5 g/dL — AB (ref 13.0–17.0)
MCH: 31.3 pg (ref 26.0–34.0)
MCHC: 33.7 g/dL (ref 30.0–36.0)
MCV: 92.9 fL (ref 78.0–100.0)
Platelets: 305 10*3/uL (ref 150–400)
RBC: 3.67 MIL/uL — ABNORMAL LOW (ref 4.22–5.81)
RDW: 13.5 % (ref 11.5–15.5)
WBC: 9.4 10*3/uL (ref 4.0–10.5)

## 2013-11-30 LAB — BASIC METABOLIC PANEL
Anion gap: 10 (ref 5–15)
BUN: 16 mg/dL (ref 6–23)
CO2: 29 meq/L (ref 19–32)
Calcium: 9.3 mg/dL (ref 8.4–10.5)
Chloride: 98 mEq/L (ref 96–112)
Creatinine, Ser: 1.05 mg/dL (ref 0.50–1.35)
GFR calc Af Amer: 70 mL/min — ABNORMAL LOW (ref >90)
GFR calc non Af Amer: 61 mL/min — ABNORMAL LOW (ref >90)
GLUCOSE: 97 mg/dL (ref 70–99)
POTASSIUM: 4.1 meq/L (ref 3.7–5.3)
SODIUM: 137 meq/L (ref 137–147)

## 2013-11-30 LAB — TYPE AND SCREEN
ABO/RH(D): A POS
ANTIBODY SCREEN: NEGATIVE

## 2013-11-30 LAB — SURGICAL PCR SCREEN
MRSA, PCR: NEGATIVE
STAPHYLOCOCCUS AUREUS: NEGATIVE

## 2013-11-30 SURGERY — POSTERIOR LUMBAR FUSION 1 LEVEL
Anesthesia: General

## 2013-11-30 MED ORDER — ONDANSETRON HCL 4 MG/2ML IJ SOLN
INTRAMUSCULAR | Status: AC
  Filled 2013-11-30: qty 2

## 2013-11-30 MED ORDER — ONDANSETRON HCL 4 MG/2ML IJ SOLN: 4.0000 mg | INTRAMUSCULAR | Status: DC | PRN

## 2013-11-30 MED ORDER — NEOSTIGMINE METHYLSULFATE 10 MG/10ML IV SOLN
INTRAVENOUS | Status: AC
  Filled 2013-11-30: qty 1

## 2013-11-30 MED ORDER — PANTOPRAZOLE SODIUM 40 MG PO TBEC
40.0000 mg | DELAYED_RELEASE_TABLET | Freq: Every day | ORAL | Status: DC
  Administered 2013-12-01 – 2013-12-11 (×11): 40 mg via ORAL
  Filled 2013-11-30 (×11): qty 1

## 2013-11-30 MED ORDER — LISINOPRIL-HYDROCHLOROTHIAZIDE 10-12.5 MG PO TABS: 1.0000 | ORAL_TABLET | Freq: Every day | ORAL | Status: DC

## 2013-11-30 MED ORDER — MENTHOL 3 MG MT LOZG: 1.0000 | LOZENGE | OROMUCOSAL | Status: DC | PRN

## 2013-11-30 MED ORDER — ACETAMINOPHEN 650 MG RE SUPP: 650.0000 mg | RECTAL | Status: DC | PRN

## 2013-11-30 MED ORDER — MORPHINE SULFATE 2 MG/ML IJ SOLN: 1.0000 mg | INTRAMUSCULAR | Status: DC | PRN

## 2013-11-30 MED ORDER — HYDROCODONE-ACETAMINOPHEN 5-325 MG PO TABS
ORAL_TABLET | ORAL | Status: AC
  Filled 2013-11-30: qty 2

## 2013-11-30 MED ORDER — MEPERIDINE HCL 25 MG/ML IJ SOLN: 6.2500 mg | INTRAMUSCULAR | Status: DC | PRN

## 2013-11-30 MED ORDER — ROCURONIUM BROMIDE 100 MG/10ML IV SOLN
INTRAVENOUS | Status: DC | PRN
  Administered 2013-11-30: 50 mg via INTRAVENOUS

## 2013-11-30 MED ORDER — METOPROLOL TARTRATE 12.5 MG HALF TABLET
12.5000 mg | ORAL_TABLET | Freq: Two times a day (BID) | ORAL | Status: DC
  Administered 2013-11-30 – 2013-12-11 (×22): 12.5 mg via ORAL
  Filled 2013-11-30 (×23): qty 1

## 2013-11-30 MED ORDER — 0.9 % SODIUM CHLORIDE (POUR BTL) OPTIME
TOPICAL | Status: DC | PRN
  Administered 2013-11-30: 1000 mL

## 2013-11-30 MED ORDER — NEOSTIGMINE METHYLSULFATE 10 MG/10ML IV SOLN
INTRAVENOUS | Status: DC | PRN
  Administered 2013-11-30: 3 mg via INTRAVENOUS

## 2013-11-30 MED ORDER — HYDROCODONE-ACETAMINOPHEN 5-325 MG PO TABS
1.0000 | ORAL_TABLET | ORAL | Status: DC | PRN
  Administered 2013-11-30: 1 via ORAL
  Administered 2013-11-30: 2 via ORAL
  Administered 2013-12-01 – 2013-12-08 (×5): 1 via ORAL
  Filled 2013-11-30 (×6): qty 1

## 2013-11-30 MED ORDER — SODIUM CHLORIDE 0.9 % IJ SOLN
3.0000 mL | Freq: Two times a day (BID) | INTRAMUSCULAR | Status: DC
  Administered 2013-12-03 – 2013-12-08 (×4): 3 mL via INTRAVENOUS

## 2013-11-30 MED ORDER — BUPIVACAINE HCL (PF) 0.5 % IJ SOLN
INTRAMUSCULAR | Status: DC | PRN
  Administered 2013-11-30: 20 mL

## 2013-11-30 MED ORDER — AMIODARONE HCL 200 MG PO TABS
200.0000 mg | ORAL_TABLET | ORAL | Status: DC
  Administered 2013-12-01 – 2013-12-11 (×7): 200 mg via ORAL
  Filled 2013-11-30 (×5): qty 1

## 2013-11-30 MED ORDER — EPHEDRINE SULFATE 50 MG/ML IJ SOLN
INTRAMUSCULAR | Status: DC | PRN
  Administered 2013-11-30: 10 mg via INTRAVENOUS
  Administered 2013-11-30 (×2): 5 mg via INTRAVENOUS
  Administered 2013-11-30 (×2): 15 mg via INTRAVENOUS

## 2013-11-30 MED ORDER — LIDOCAINE HCL (CARDIAC) 20 MG/ML IV SOLN
INTRAVENOUS | Status: AC
  Filled 2013-11-30: qty 5

## 2013-11-30 MED ORDER — PROPOFOL 10 MG/ML IV BOLUS
INTRAVENOUS | Status: AC
  Filled 2013-11-30: qty 20

## 2013-11-30 MED ORDER — IPRATROPIUM BROMIDE 0.06 % NA SOLN
2.0000 | Freq: Three times a day (TID) | NASAL | Status: DC
  Administered 2013-11-30 – 2013-12-11 (×29): 2 via NASAL
  Filled 2013-11-30: qty 15

## 2013-11-30 MED ORDER — PHENYLEPHRINE 40 MCG/ML (10ML) SYRINGE FOR IV PUSH (FOR BLOOD PRESSURE SUPPORT)
PREFILLED_SYRINGE | INTRAVENOUS | Status: AC
  Filled 2013-11-30: qty 10

## 2013-11-30 MED ORDER — CALCIUM CITRATE 950 (200 CA) MG PO TABS
200.0000 mg | ORAL_TABLET | Freq: Two times a day (BID) | ORAL | Status: DC
  Administered 2013-11-30 – 2013-12-11 (×22): 200 mg via ORAL
  Filled 2013-11-30 (×28): qty 1

## 2013-11-30 MED ORDER — PROPOFOL 10 MG/ML IV BOLUS
INTRAVENOUS | Status: DC | PRN
  Administered 2013-11-30: 50 mg via INTRAVENOUS

## 2013-11-30 MED ORDER — GLYCOPYRROLATE 0.2 MG/ML IJ SOLN
INTRAMUSCULAR | Status: DC | PRN
  Administered 2013-11-30: .6 mg via INTRAVENOUS

## 2013-11-30 MED ORDER — HYDROCHLOROTHIAZIDE 12.5 MG PO CAPS
12.5000 mg | ORAL_CAPSULE | Freq: Every day | ORAL | Status: DC
  Administered 2013-11-30 – 2013-12-11 (×12): 12.5 mg via ORAL
  Filled 2013-11-30 (×12): qty 1

## 2013-11-30 MED ORDER — LISINOPRIL 10 MG PO TABS
10.0000 mg | ORAL_TABLET | Freq: Every day | ORAL | Status: DC
  Administered 2013-11-30 – 2013-12-11 (×12): 10 mg via ORAL
  Filled 2013-11-30 (×12): qty 1

## 2013-11-30 MED ORDER — DIAZEPAM 5 MG PO TABS
5.0000 mg | ORAL_TABLET | Freq: Four times a day (QID) | ORAL | Status: DC | PRN
  Administered 2013-12-01 – 2013-12-06 (×8): 5 mg via ORAL
  Filled 2013-11-30 (×10): qty 1

## 2013-11-30 MED ORDER — ROCURONIUM BROMIDE 50 MG/5ML IV SOLN
INTRAVENOUS | Status: AC
  Filled 2013-11-30: qty 1

## 2013-11-30 MED ORDER — SODIUM CHLORIDE 0.9 % IV SOLN
10.0000 mg | INTRAVENOUS | Status: DC | PRN
  Administered 2013-11-30: 25 ug/min via INTRAVENOUS

## 2013-11-30 MED ORDER — PHENOL 1.4 % MT LIQD: 1.0000 | OROMUCOSAL | Status: DC | PRN

## 2013-11-30 MED ORDER — PHENYLEPHRINE HCL 10 MG/ML IJ SOLN
INTRAMUSCULAR | Status: AC
  Filled 2013-11-30: qty 1

## 2013-11-30 MED ORDER — CALCIUM CITRATE PLUS PO TABS: 1.0000 | ORAL_TABLET | Freq: Two times a day (BID) | ORAL | Status: DC

## 2013-11-30 MED ORDER — POLYETHYLENE GLYCOL 3350 17 G PO PACK: 17.0000 g | PACK | Freq: Every day | ORAL | Status: DC | PRN

## 2013-11-30 MED ORDER — GLYCOPYRROLATE 0.2 MG/ML IJ SOLN
INTRAMUSCULAR | Status: AC
  Filled 2013-11-30: qty 4

## 2013-11-30 MED ORDER — FINASTERIDE 5 MG PO TABS
5.0000 mg | ORAL_TABLET | Freq: Every day | ORAL | Status: DC
  Administered 2013-11-30 – 2013-12-11 (×12): 5 mg via ORAL
  Filled 2013-11-30 (×12): qty 1

## 2013-11-30 MED ORDER — ARTIFICIAL TEARS OP OINT
TOPICAL_OINTMENT | OPHTHALMIC | Status: DC | PRN
  Administered 2013-11-30: 1 via OPHTHALMIC

## 2013-11-30 MED ORDER — PHENYLEPHRINE HCL 10 MG/ML IJ SOLN
INTRAMUSCULAR | Status: DC | PRN
  Administered 2013-11-30: 40 ug via INTRAVENOUS
  Administered 2013-11-30: 80 ug via INTRAVENOUS
  Administered 2013-11-30: 40 ug via INTRAVENOUS
  Administered 2013-11-30 (×3): 80 ug via INTRAVENOUS

## 2013-11-30 MED ORDER — SURGIFOAM 100 EX MISC
CUTANEOUS | Status: DC | PRN
  Administered 2013-11-30: 11:00:00 via TOPICAL

## 2013-11-30 MED ORDER — OXYCODONE HCL 5 MG/5ML PO SOLN: 5.0000 mg | Freq: Once | ORAL | Status: DC | PRN

## 2013-11-30 MED ORDER — FENTANYL CITRATE 0.05 MG/ML IJ SOLN
INTRAMUSCULAR | Status: AC
  Filled 2013-11-30: qty 5

## 2013-11-30 MED ORDER — MIDAZOLAM HCL 2 MG/2ML IJ SOLN
INTRAMUSCULAR | Status: AC
  Filled 2013-11-30: qty 2

## 2013-11-30 MED ORDER — LACTATED RINGERS IV SOLN
INTRAVENOUS | Status: DC | PRN
  Administered 2013-11-30 (×3): via INTRAVENOUS

## 2013-11-30 MED ORDER — ALENDRONATE SODIUM 70 MG PO TABS: 70.0000 mg | ORAL_TABLET | ORAL | Status: DC

## 2013-11-30 MED ORDER — SODIUM CHLORIDE 0.9 % IV SOLN: 250.0000 mL | INTRAVENOUS | Status: DC

## 2013-11-30 MED ORDER — AMIODARONE HCL 200 MG PO TABS
100.0000 mg | ORAL_TABLET | ORAL | Status: DC
  Administered 2013-12-02 – 2013-12-09 (×3): 100 mg via ORAL
  Filled 2013-11-30 (×4): qty 1
  Filled 2013-11-30: qty 0.5
  Filled 2013-11-30 (×5): qty 1

## 2013-11-30 MED ORDER — ONDANSETRON HCL 4 MG/2ML IJ SOLN: 4.0000 mg | Freq: Once | INTRAMUSCULAR | Status: DC | PRN

## 2013-11-30 MED ORDER — ONDANSETRON HCL 4 MG/2ML IJ SOLN
INTRAMUSCULAR | Status: DC | PRN
  Administered 2013-11-30: 4 mg via INTRAVENOUS

## 2013-11-30 MED ORDER — POTASSIUM CHLORIDE IN NACL 20-0.9 MEQ/L-% IV SOLN
INTRAVENOUS | Status: DC
  Administered 2013-11-30 – 2013-12-02 (×4): via INTRAVENOUS
  Filled 2013-11-30 (×4): qty 1000

## 2013-11-30 MED ORDER — SIMVASTATIN 20 MG PO TABS
20.0000 mg | ORAL_TABLET | Freq: Every day | ORAL | Status: DC
  Administered 2013-11-30 – 2013-12-11 (×11): 20 mg via ORAL
  Filled 2013-11-30 (×11): qty 1

## 2013-11-30 MED ORDER — FENTANYL CITRATE 0.05 MG/ML IJ SOLN
INTRAMUSCULAR | Status: DC | PRN
  Administered 2013-11-30 (×2): 50 ug via INTRAVENOUS
  Administered 2013-11-30: 150 ug via INTRAVENOUS

## 2013-11-30 MED ORDER — LIDOCAINE HCL (CARDIAC) 20 MG/ML IV SOLN
INTRAVENOUS | Status: DC | PRN
  Administered 2013-11-30: 100 mg via INTRAVENOUS

## 2013-11-30 MED ORDER — SODIUM CHLORIDE 0.9 % IJ SOLN: 3.0000 mL | INTRAMUSCULAR | Status: DC | PRN

## 2013-11-30 MED ORDER — ACETAMINOPHEN 325 MG PO TABS: 650.0000 mg | ORAL_TABLET | ORAL | Status: DC | PRN

## 2013-11-30 MED ORDER — AMIODARONE HCL 200 MG PO TABS: 100.0000 mg | ORAL_TABLET | Freq: Two times a day (BID) | ORAL | Status: DC

## 2013-11-30 MED ORDER — SENNA 8.6 MG PO TABS
1.0000 | ORAL_TABLET | Freq: Two times a day (BID) | ORAL | Status: DC
  Administered 2013-11-30 – 2013-12-11 (×21): 8.6 mg via ORAL
  Filled 2013-11-30 (×21): qty 1

## 2013-11-30 MED ORDER — HYDROMORPHONE HCL PF 1 MG/ML IJ SOLN
0.2500 mg | INTRAMUSCULAR | Status: DC | PRN
  Administered 2013-11-30: 0.5 mg via INTRAVENOUS

## 2013-11-30 MED ORDER — THROMBIN 5000 UNITS EX SOLR
OROMUCOSAL | Status: DC | PRN
  Administered 2013-11-30: 13:00:00 via TOPICAL

## 2013-11-30 MED ORDER — LACTATED RINGERS IV SOLN
INTRAVENOUS | Status: DC
  Administered 2013-11-30: 08:00:00 via INTRAVENOUS

## 2013-11-30 MED ORDER — SODIUM CHLORIDE 0.9 % IJ SOLN
INTRAMUSCULAR | Status: AC
  Filled 2013-11-30: qty 10

## 2013-11-30 MED ORDER — HYDROMORPHONE HCL PF 1 MG/ML IJ SOLN
INTRAMUSCULAR | Status: AC
  Filled 2013-11-30: qty 1

## 2013-11-30 MED ORDER — LIDOCAINE-EPINEPHRINE 0.5 %-1:200000 IJ SOLN
INTRAMUSCULAR | Status: DC | PRN
Start: 2013-11-30 — End: 2013-11-30
  Administered 2013-11-30: 5 mL via INTRADERMAL

## 2013-11-30 MED ORDER — SUCCINYLCHOLINE CHLORIDE 20 MG/ML IJ SOLN
INTRAMUSCULAR | Status: AC
  Filled 2013-11-30: qty 1

## 2013-11-30 MED ORDER — OXYCODONE HCL 5 MG PO TABS: 5.0000 mg | ORAL_TABLET | Freq: Once | ORAL | Status: DC | PRN

## 2013-11-30 MED ORDER — TAMSULOSIN HCL 0.4 MG PO CAPS
0.4000 mg | ORAL_CAPSULE | Freq: Every day | ORAL | Status: DC
  Administered 2013-12-01 – 2013-12-11 (×11): 0.4 mg via ORAL
  Filled 2013-11-30 (×13): qty 1

## 2013-11-30 SURGICAL SUPPLY — 83 items
ADH SKN CLS APL DERMABOND .7 (GAUZE/BANDAGES/DRESSINGS) ×1
APL SKNCLS STERI-STRIP NONHPOA (GAUZE/BANDAGES/DRESSINGS)
BENZOIN TINCTURE PRP APPL 2/3 (GAUZE/BANDAGES/DRESSINGS) IMPLANT
BLADE 10 SAFETY STRL DISP (BLADE) ×3 IMPLANT
BLADE SURG ROTATE 9660 (MISCELLANEOUS) IMPLANT
BUR MATCHSTICK NEURO 3.0 LAGG (BURR) ×3 IMPLANT
CANISTER SUCT 3000ML (MISCELLANEOUS) ×3 IMPLANT
CAP REVERE LOCKING (Cap) ×8 IMPLANT
CEMENT BONE KYPHX HV R (Orthopedic Implant) ×2 IMPLANT
CEMENT KYPHON C01A KIT/MIXER (Cement) ×2 IMPLANT
CLOSURE WOUND 1/2 X4 (GAUZE/BANDAGES/DRESSINGS)
CONT SPEC 4OZ CLIKSEAL STRL BL (MISCELLANEOUS) ×3 IMPLANT
COVER BACK TABLE 24X17X13 BIG (DRAPES) IMPLANT
DECANTER SPIKE VIAL GLASS SM (MISCELLANEOUS) ×3 IMPLANT
DERMABOND ADVANCED (GAUZE/BANDAGES/DRESSINGS) ×2
DERMABOND ADVANCED .7 DNX12 (GAUZE/BANDAGES/DRESSINGS) ×1 IMPLANT
DRAPE C-ARM 42X72 X-RAY (DRAPES) ×6 IMPLANT
DRAPE C-ARMOR (DRAPES) ×2 IMPLANT
DRAPE LAPAROTOMY 100X72X124 (DRAPES) ×3 IMPLANT
DRAPE POUCH INSTRU U-SHP 10X18 (DRAPES) ×3 IMPLANT
DRAPE SURG 17X23 STRL (DRAPES) ×3 IMPLANT
DRSG OPSITE POSTOP 4X8 (GAUZE/BANDAGES/DRESSINGS) ×2 IMPLANT
DRSG TELFA 3X8 NADH (GAUZE/BANDAGES/DRESSINGS) IMPLANT
DURAPREP 26ML APPLICATOR (WOUND CARE) ×3 IMPLANT
ELECT REM PT RETURN 9FT ADLT (ELECTROSURGICAL) ×3
ELECTRODE REM PT RTRN 9FT ADLT (ELECTROSURGICAL) ×1 IMPLANT
EVACUATOR 1/8 PVC DRAIN (DRAIN) ×2 IMPLANT
GAUZE SPONGE 4X4 16PLY XRAY LF (GAUZE/BANDAGES/DRESSINGS) IMPLANT
GLOVE BIOGEL PI IND STRL 8 (GLOVE) IMPLANT
GLOVE BIOGEL PI INDICATOR 8 (GLOVE) ×6
GLOVE ECLIPSE 6.5 STRL STRAW (GLOVE) ×6 IMPLANT
GLOVE ECLIPSE 7.5 STRL STRAW (GLOVE) ×6 IMPLANT
GLOVE ECLIPSE 8.0 STRL XLNG CF (GLOVE) ×2 IMPLANT
GLOVE EXAM NITRILE LRG STRL (GLOVE) IMPLANT
GLOVE EXAM NITRILE MD LF STRL (GLOVE) ×2 IMPLANT
GLOVE EXAM NITRILE XL STR (GLOVE) IMPLANT
GLOVE EXAM NITRILE XS STR PU (GLOVE) IMPLANT
GOWN STRL REUS W/ TWL LRG LVL3 (GOWN DISPOSABLE) ×2 IMPLANT
GOWN STRL REUS W/ TWL XL LVL3 (GOWN DISPOSABLE) IMPLANT
GOWN STRL REUS W/TWL 2XL LVL3 (GOWN DISPOSABLE) ×6 IMPLANT
GOWN STRL REUS W/TWL LRG LVL3 (GOWN DISPOSABLE) ×3
GOWN STRL REUS W/TWL XL LVL3 (GOWN DISPOSABLE) ×3
GRAFT BN 10X1XDBM MAGNIFUSE (Bone Implant) IMPLANT
GRAFT BONE MAGNIFUSE 1X10CM (Bone Implant) ×3 IMPLANT
HEMOSTAT POWDER SURGIFOAM 1G (HEMOSTASIS) ×2 IMPLANT
KIT BASIN OR (CUSTOM PROCEDURE TRAY) ×3 IMPLANT
KIT INFUSE MEDIUM (Orthopedic Implant) ×2 IMPLANT
KIT POSITION SURG JACKSON T1 (MISCELLANEOUS) ×3 IMPLANT
KIT ROOM TURNOVER OR (KITS) ×3 IMPLANT
MILL MEDIUM DISP (BLADE) ×2 IMPLANT
NDL BIOPSY T-LOK 11X4 (NEEDLE) IMPLANT
NDL HYPO 25X1 1.5 SAFETY (NEEDLE) ×1 IMPLANT
NDL SPNL 18GX3.5 QUINCKE PK (NEEDLE) IMPLANT
NEEDLE BIOPSY T-LOK 11X4 (NEEDLE) ×3 IMPLANT
NEEDLE HYPO 25X1 1.5 SAFETY (NEEDLE) ×6 IMPLANT
NEEDLE SPNL 18GX3.5 QUINCKE PK (NEEDLE) IMPLANT
NS IRRIG 1000ML POUR BTL (IV SOLUTION) ×3 IMPLANT
PACK LAMINECTOMY NEURO (CUSTOM PROCEDURE TRAY) ×3 IMPLANT
PAD ARMBOARD 7.5X6 YLW CONV (MISCELLANEOUS) ×6 IMPLANT
PAD DRESSING TELFA 3X8 NADH (GAUZE/BANDAGES/DRESSINGS) IMPLANT
PLATE SPINOUS PROCESS 45MM (Plate) ×2 IMPLANT
ROD REVERE 6.35 CURVED 60MM (Rod) ×2 IMPLANT
ROD REVERE CURVED 65MM (Rod) ×2 IMPLANT
SCREW FAS 5.5X40 (Screw) ×2 IMPLANT
SCREW REVERE 5.5X45 (Screw) ×4 IMPLANT
SCREW REVERE 6.35 6.5MMX45 (Screw) ×2 IMPLANT
SPONGE GAUZE 4X4 12PLY (GAUZE/BANDAGES/DRESSINGS) IMPLANT
SPONGE LAP 4X18 X RAY DECT (DISPOSABLE) IMPLANT
SPONGE SURGIFOAM ABS GEL 100 (HEMOSTASIS) ×3 IMPLANT
STRIP CLOSURE SKIN 1/2X4 (GAUZE/BANDAGES/DRESSINGS) IMPLANT
SUT PROLENE 6 0 BV (SUTURE) IMPLANT
SUT VIC AB 0 CT1 18XCR BRD8 (SUTURE) ×1 IMPLANT
SUT VIC AB 0 CT1 8-18 (SUTURE) ×6
SUT VIC AB 2-0 CT1 18 (SUTURE) ×3 IMPLANT
SUT VIC AB 3-0 SH 8-18 (SUTURE) ×7 IMPLANT
SYR 20CC LL (SYRINGE) ×2 IMPLANT
SYR 20ML ECCENTRIC (SYRINGE) ×3 IMPLANT
SYR CONTROL 10ML LL (SYRINGE) ×2 IMPLANT
TOWEL OR 17X24 6PK STRL BLUE (TOWEL DISPOSABLE) ×3 IMPLANT
TOWEL OR 17X26 10 PK STRL BLUE (TOWEL DISPOSABLE) ×3 IMPLANT
TRAY FOLEY CATH 14FRSI W/METER (CATHETERS) ×3 IMPLANT
TRAY KYPHOPAK 20/3 ONESTEP 1ST (MISCELLANEOUS) ×2 IMPLANT
WATER STERILE IRR 1000ML POUR (IV SOLUTION) ×3 IMPLANT

## 2013-11-30 NOTE — Anesthesia Preprocedure Evaluation (Signed)
Anesthesia Evaluation  Patient identified by MRN, date of birth, ID band Patient awake    Reviewed: Allergy & Precautions, H&P , NPO status , Patient's Chart, lab work & pertinent test results  Airway Mallampati: I TM Distance: >3 FB Neck ROM: Full    Dental   Pulmonary former smoker,          Cardiovascular hypertension, Pt. on medications     Neuro/Psych    GI/Hepatic GERD-  Medicated and Controlled,  Endo/Other    Renal/GU      Musculoskeletal   Abdominal   Peds  Hematology   Anesthesia Other Findings   Reproductive/Obstetrics                           Anesthesia Physical Anesthesia Plan  ASA: III  Anesthesia Plan: General   Post-op Pain Management:    Induction: Intravenous  Airway Management Planned: Oral ETT  Additional Equipment:   Intra-op Plan:   Post-operative Plan: Extubation in OR  Informed Consent: I have reviewed the patients History and Physical, chart, labs and discussed the procedure including the risks, benefits and alternatives for the proposed anesthesia with the patient or authorized representative who has indicated his/her understanding and acceptance.     Plan Discussed with: CRNA and Surgeon  Anesthesia Plan Comments:         Anesthesia Quick Evaluation

## 2013-11-30 NOTE — H&P (Signed)
BP 175/70  Pulse 57  Temp(Src) 98.5 F (36.9 C) (Oral)  Resp 18  Wt 66.679 kg (147 lb)  SpO2 99% Mark Joseph presents for posterior stabilization of an L2 compression fracture, treated via an anterior lateral approach with an L2 corpectomy, titanium interbody strut L1-L3, and lateral plating. On his first post op appointment xray showed the interbody migrating into the L3 vertebral body. I recommended posterior stabilization now before any big problems developed. CT this week showed some residual stenosis which will also be dealt with today.  No Known Allergies Prior to Admission medications   Medication Sig Start Date End Date Taking? Authorizing Provider  acetaminophen (TYLENOL) 325 MG tablet Take 650 mg by mouth every 6 (six) hours as needed for moderate pain.   Yes Historical Provider, MD  alendronate (FOSAMAX) 70 MG tablet Take 70 mg by mouth once a week. Sunday.  Take with a full glass of water on an empty stomach.   Yes Historical Provider, MD  amiodarone (PACERONE) 200 MG tablet Take 100-200 mg by mouth 2 (two) times daily. *takes 200mg  daily Monday through Friday, but takes 100mg  daily Saturday and Sunday*   Yes Historical Provider, MD  apixaban (ELIQUIS) 5 MG TABS tablet Take 5 mg by mouth 2 (two) times daily.   Yes Historical Provider, MD  finasteride (PROSCAR) 5 MG tablet Take 5 mg by mouth daily.   Yes Historical Provider, MD  ipratropium (ATROVENT) 0.06 % nasal spray Place 2 sprays into both nostrils 3 (three) times daily as needed for rhinitis.   Yes Historical Provider, MD  lisinopril-hydrochlorothiazide (PRINZIDE,ZESTORETIC) 10-12.5 MG per tablet Take 1 tablet by mouth daily.   Yes Historical Provider, MD  metoprolol tartrate (LOPRESSOR) 25 MG tablet Take 12.5 mg by mouth 2 (two) times daily.   Yes Historical Provider, MD  Multiple Minerals-Vitamins (CALCIUM CITRATE PLUS) TABS Take 1 tablet by mouth 2 (two) times daily.   Yes Historical Provider, MD  pantoprazole (PROTONIX) 40 MG  tablet Take 40 mg by mouth daily.   Yes Historical Provider, MD  polyethylene glycol (MIRALAX / GLYCOLAX) packet Take 17 g by mouth daily as needed for mild constipation.    Yes Historical Provider, MD  pravastatin (PRAVACHOL) 40 MG tablet Take 40 mg by mouth daily.   Yes Historical Provider, MD  tamsulosin (FLOMAX) 0.4 MG CAPS capsule Take 0.4 mg by mouth daily after breakfast.   Yes Historical Provider, MD   Past Medical History  Diagnosis Date  . Hypertension   . GERD (gastroesophageal reflux disease)   . Osteoporosis   . High cholesterol   . Stroke 01/2012    "small; affected his speech; pretty much back to normal now" (09/15/2012)  . Pneumonitis 1971  . Arthritis     "knees; right is worse" (09/15/2012)  . Dysphagia     had barium swallow done 11/29/13. Being followed by Dr. Emeline Darling  . Atrial fibrillation     controlled by metoprolol and amiodarone   Past Surgical History  Procedure Laterality Date  . Appendectomy  1953  . Inguinal hernia repair Bilateral 09/15/2012    w/mesh Hattie Perch 09/15/2012  . Inguinal hernia repair Bilateral 09/15/2012    Procedure: HERNIA REPAIR INGUINAL ADULT BILATERAL;  Surgeon: Emelia Loron, MD;  Location: [facility] OR;  Service: General;  Laterality: Bilateral;  . Insertion of mesh Bilateral 09/15/2012    Procedure: INSERTION OF MESH;  Surgeon: Emelia Loron, MD;  Location[facility] OR;  Service: General;  Laterality: Bilateral;  . Anterior lat  lumbar fusion Left 10/12/2013    Procedure: LUMBAR TWO CORPECTOMY,LUMBAR ONE-LUMBAR THREE ARTHRODESIS.;  Surgeon: Carmela HurtKyle L Kelci Petrella, MD;  Location: [facility] OR[facility];  Service: Neurosurgery;  Laterality: Left;   History reviewed. No pertinent family history. History   Social History  . Marital Status: Married    Spouse Name: N/A    Number of Children: N/A  . Years of Education: N/A   Occupational History  . Not on file.   Social History Main Topics  . Smoking status: Former Smoker -- 0.50 packs/day for 25 years    Types:  Cigarettes    Quit date: 05/11/1968  . Smokeless tobacco: Never Used  . Alcohol Use: No  . Drug Use: No  . Sexual Activity: Not on file   Other Topics Concern  . Not on file   Social History Narrative  . No narrative on file   Physical Exam  Constitutional: He is oriented to person, place, and time. No distress.  cachectic  HENT:  Head: Normocephalic and atraumatic.  Right Ear: External ear normal.  Left Ear: External ear normal.  Eyes: Conjunctivae and EOM are normal. Pupils are equal, round, and reactive to light.  Neck: Normal range of motion. Neck supple.  Cardiovascular:  afib  Pulmonary/Chest: Effort normal and breath sounds normal.  Abdominal: Soft. Bowel sounds are normal.  Musculoskeletal: Normal range of motion.  Neurological: He is alert and oriented to person, place, and time. He displays abnormal reflex. No cranial nerve deficit. Coordination normal.  Skin: Skin is warm and dry. He is not diaphoretic.  Psychiatric: He has a normal mood and affect. His behavior is normal. Judgment and thought content normal.  A/P Or for pedicle screw fixation, arthrodesis, and decompression

## 2013-11-30 NOTE — Op Note (Signed)
11/30/2013  2:53 PM  PATIENT:  Mark CroonLeon A Walts  80 y.o. male  PRE-OPERATIVE DIAGNOSIS:  lumbar fracture L2, anterior instrumentation failure  POST-OPERATIVE DIAGNOSIS:  lumbar fracture, anterior instrumentation failure  PROCEDURE:  Procedure(s):L2 canal and bilateral L2 root decompression Posterolateral arthrodesis L1-3, morselized allograft, autograft Lumbar One through Lumbar Three posterior lateral instrumentation , globus pedicle screws, augmented with methylmethacrylate Spire plate placement L1-L3   SURGEON:  Surgeon(s): Carmela HurtKyle L Mya Suell, MD Reinaldo Meekerandy O Kritzer, MD  ASSISTANTS:Kritzer, Harvie Heckandy  ANESTHESIA:   general  EBL:  Total I/O In: 2500 [I.V.:2500] Out: 1925 [Urine:1425; Blood:500]  BLOOD ADMINISTERED:none  CELL SAVER GIVEN:none  COUNT:per nursing  DRAINS: none   SPECIMEN:  No Specimen  DICTATION: [name] was taken to the operating room, intubated and placed under a general anesthetic without difficulty. He had his foley catheter changed, then he was positioned prone on the [place] table with all pressure points padded. His back was prepped and draped in a sterile manner. I infiltrated 20 cc lidocaine into the planned incision. I opened the skin and exposed the lamina of T12, L1,2, and L3 bilaterally. I confirmed my location with fluoroscopy.  I placed 4 pedicle screws with fluoroscopic guidance. The bone quality was extraordinarily poor, thus I placed methylmethacrylate into the pedicles before placing the screws. I placed screws at L1, and at L3 bilaterally. Each screw was placed in the same manner, I used the drill to create an opening in the pedicle. I then sounded the pedicle with a blunt probe and fluoro guidance in both ap and lateral planes. I tapped the pedicle then finished as detailed earlier.  l then decompressed the spinal canal,via a transpedicular approach into the vertebral body of L2. I performed a complete laminectomy of L2 and removed the ligamentum flavum  to expose the thecal sac. Then I took down the facet and started drilling into the pedicle laterally. I was able to create a cavity in the vertebral body which allowed me to impact the bone out of the canal bilaterally. I fully decompressed the L2 roots bilaterally by using the Kerrison punch to remove the overlying bone in the neural foramina.  We then decorticated the lateral bony margins and placed allograft, and autograft from L1-L3 to complete the posterolateral arthrodesis.  We placed the rods into the pedicle screw heads and secured them with locking caps. We then placed the large spire plate connecting it to the lamina of L1, and L3. At that time we irrigated then started to close the wound. We closed in layers approximating the thoracolumbar fascia, and the subcutaneous, and subcuticular layers with vicryl sutures. Final xray showed the screws and plate to be in good position. Sterile dressing was dermabond and a honeycomb dressing.    PLAN OF CARE: Admit to inpatient   PATIENT DISPOSITION:  PACU - hemodynamically stable.   Delay start of Pharmacological VTE agent (>24hrs) due to surgical blood loss or risk of bleeding:  yes

## 2013-11-30 NOTE — Transfer of Care (Signed)
Immediate Anesthesia Transfer of Care Note  Patient: Mark Joseph  Procedure(s) Performed: Procedure(s) with comments: Lumbar One through Lumbar Three posterior lateral instrumentation only (N/A) - Lumbar One through Lumbar Three posterior lateral instrumentation only  Patient Location: PACU  Anesthesia Type:General  Level of Consciousness: sedated  Airway & Oxygen Therapy: Patient Spontanous Breathing and Patient connected to nasal cannula oxygen  Post-op Assessment: Report given to PACU RN, Post -op Vital signs reviewed and stable and Patient moving all extremities X 4  Post vital signs: Reviewed and stable  Complications: No apparent anesthesia complications

## 2013-11-30 NOTE — Plan of Care (Signed)
Problem: Consults Goal: Diagnosis - Spinal Surgery Thoraco/Lumbar Spine Fusion     

## 2013-11-30 NOTE — Anesthesia Procedure Notes (Signed)
Procedure Name: Intubation Date/Time: 11/30/2013 9:46 AM Performed by: Carmela RimaMARTINELLI, Mark Joseph Pre-anesthesia Checklist: Patient identified, Timeout performed, Emergency Drugs available, Suction available and Patient being monitored Patient Re-evaluated:Patient Re-evaluated prior to inductionOxygen Delivery Method: Circle system utilized Preoxygenation: Pre-oxygenation with 100% oxygen Intubation Type: IV induction Ventilation: Mask ventilation without difficulty Laryngoscope Size: Mac and 3 Grade View: Grade I Tube type: Oral Tube size: 7.5 mm Number of attempts: 1 Placement Confirmation: positive ETCO2,  ETT inserted through vocal cords under direct vision and breath sounds checked- equal and bilateral Secured at: 23 cm Tube secured with: Tape Dental Injury: Teeth and Oropharynx as per pre-operative assessment

## 2013-12-01 NOTE — Anesthesia Postprocedure Evaluation (Signed)
Anesthesia Post Note  Patient: Mark Joseph  Procedure(s) Performed: Procedure(s) (LRB): Lumbar One through Lumbar Three posterior lateral instrumentation, Cement, and bone graft (N/A)  Anesthesia type: general  Patient location: PACU  Post pain: Pain level controlled  Post assessment: Patient's Cardiovascular Status Stable  Last Vitals:  Filed Vitals:   12/01/13 1425  BP: 116/59  Pulse: 78  Temp: 37.5 C  Resp: 18    Post vital signs: Reviewed and stable  Level of consciousness: sedated  Complications: No apparent anesthesia complications

## 2013-12-01 NOTE — Clinical Social Work Note (Addendum)
CSW consulted for possible SNF placement at time of discharge. CSW will continue to follow and assist once discharge disposition determined. Thank you for the referral.   Update: 1:16pm CSW met with pt and pt's wife at bedside to discuss PT recommendation for SNF placement. Pt and pt's wife stated pt will be discharging home and not SNF. RNCM updated. CSW signing off. Thank you for the referral.  Lubertha Sayres, MSW, [facility] Licensed Clinical Social Worker [phone] and [phone]

## 2013-12-01 NOTE — Progress Notes (Signed)
Patient ID: Mark CroonLeon A Tayag, male   DOB: 03/16/1922, 80 y.o.   MRN: [id number] BP 105/62  Pulse 74  Temp(Src) 98.9 F (37.2 C) (Oral)  Resp 18  Ht 5\' 7"  (1.702 m)  Wt 67.087 kg (147 lb 14.4 oz)  BMI 23.16 kg/m2  SpO2 98% Alert and oriented x 4 Moving lower extremities well Wound is clean and dry Continue pt.

## 2013-12-01 NOTE — OR Nursing (Signed)
Late entry: Number changed for spinous process plate today (6/96/297/23/15). Sales rep. Carroll Sageodd Jones for [employer] gave me the correct number to change the charge in the system. Updated at [phone].

## 2013-12-01 NOTE — Progress Notes (Signed)
Patient ID: Ashok CroonLeon A Steuck, male   DOB: 10/11/1921, 80 y.o.   MRN: [id number] BP 105/62  Pulse 74  Temp(Src) 98.9 F (37.2 C) (Oral)  Resp 18  Ht 5\' 7"  (1.702 m)  Wt 67.087 kg (147 lb 14.4 oz)  BMI 23.16 kg/m2  SpO2 98% Alert and oriented x 4 Moving all extremities well Wound is clean, dry, and without signs of infection Continue with pt

## 2013-12-01 NOTE — Evaluation (Signed)
Occupational Therapy Evaluation Patient Details Name: Mark Joseph MRN: [id number] DOB: 1921-07-26 Today's Date: 12/01/2013    History of Present Illness Pt s/p L2 canal and bilateral L2 root decompression, posterolateral arthrodesis L1-3, morselized allograft, autograft ,Lumbar One through Lumbar Three posterior lateral instrumentation , globus pedicle screws, augmented with methylmethacrylate, Spire plate placement L1-L3.   Pt admitted in June 2015 s/p L2 copectomy and L1-3 arthrodesis due to L2 compression fx. Pt then went to SNF for rehab and has now been home since July 6th per pt's wife report.    Clinical Impression   Pt admitted for posterior stablization of spinal sx from June 2015.  Pt has been home for past month since being discharged from rehab and has been supervisions with mobility, min assist with ADLs.  He has also had a foley catheter at home. Pt will benefit from occupational therapy services to address below problem list. Pt and wife wish to go home from hospital. However pt very deconditioned today and requiring +2 total assist to stand.  At this time, OT recommending SNF but may update plan to [facility] OT pending pt progress.    Follow Up Recommendations  SNF;Supervision/Assistance - 24 hour    Equipment Recommendations  None recommended by OT    Recommendations for Other Services       Precautions / Restrictions Precautions Precautions: Back;Fall Precaution Comments: Reviewed 3/3 back precautions. Required Braces or Orthoses: Spinal Brace Spinal Brace: Lumbar corset;Applied in sitting position      Mobility Bed Mobility Overal bed mobility: Needs Assistance Bed Mobility: Rolling;Sidelying to Sit Rolling: Min assist Sidelying to sit: +2 for physical assistance;Mod assist       General bed mobility comments: Assist to support transition of trunk OOB and to assist with initial scooting of hips toward EOB.  Transfers Overall transfer level: Needs  assistance Equipment used: Rolling walker (2 wheeled) Transfers: Sit to/from Stand Sit to Stand: +2 physical assistance;Mod assist Stand pivot transfers: Min assist       General transfer comment: +2 assist for power up and for support while transitioning UEs from armrests to RW.    Balance                                            ADL Overall ADL's : Needs assistance/impaired Eating/Feeding: Set up;Sitting       Upper Body Bathing: Minimal assitance;Sitting   Lower Body Bathing: Maximal assistance;Sitting/lateral leans   Upper Body Dressing : Moderate assistance;Sitting   Lower Body Dressing: Maximal assistance;Sitting/lateral leans               Functional mobility during ADLs: +2 for physical assistance;Moderate assistance;Rolling walker General ADL Comments: Pt with posterior pelvic tilt when sitting EOB.  Assist to don back brace and socks.  Pt deconditions quickly requiring frequent rest breaks.  Requires +2 assist for sit<>stand and close chair follow for ambulating.     Vision                     Perception     Praxis      Pertinent Vitals/Pain See vitals tab     Hand Dominance     Extremity/Trunk Assessment Upper Extremity Assessment Upper Extremity Assessment: Generalized weakness       Cervical / Trunk Assessment Cervical / Trunk Assessment: Kyphotic   Communication Communication Communication: No  difficulties;HOH   Cognition Arousal/Alertness: Awake/alert Behavior During Therapy: WFL for tasks assessed/performed Overall Cognitive Status: Within Functional Limits for tasks assessed                     General Comments       Exercises       Shoulder Instructions      Home Living Family/patient expects to be discharged to:: Private residence Living Arrangements: Spouse/significant other Available Help at Discharge: Family;Available 24 hours/day Type of Home: House Home Access: Ramped  entrance Entrance Stairs-Number of Steps: 2 Entrance Stairs-Rails: None Home Layout: Two level;Able to live on main level with bedroom/bathroom Alternate Level Stairs-Number of Steps: flight   Bathroom Shower/Tub: Walk-in shower;Door   Foot LockerBathroom Toilet: Handicapped height Bathroom Accessibility: Yes How Accessible: Accessible via walker Home Equipment: Bedside commode;Cane - quad;Cane - single point;Walker - 2 wheels;Shower seat;Adaptive equipment (bed rail) Adaptive Equipment: Reacher;Sock aid;Long-handled shoe horn;Long-handled sponge    Lives With: Spouse    Prior Functioning/Environment Level of Independence: Needs assistance  Gait / Transfers Assistance Needed: supervision with RW for past month at home ADL's / Homemaking Assistance Needed: occasional min assist from wife for ADLs over past month   Comments: Prior to initial fall in March, pt was independent.    OT Diagnosis: Generalized weakness;Acute pain   OT Problem List: Decreased strength;Decreased activity tolerance;Impaired balance (sitting and/or standing);Decreased knowledge of use of DME or AE;Decreased knowledge of precautions;Pain   OT Treatment/Interventions: Self-care/ADL training;DME and/or AE instruction;Therapeutic activities;Patient/family education;Balance training    OT Goals(Current goals can be found in the care plan section) Acute Rehab OT Goals Patient Stated Goal: to go home OT Goal Formulation: With patient/family Time For Goal Achievement: 12/15/13 Potential to Achieve Goals: Good  OT Frequency: Min 2X/week   Barriers to D/C:            Co-evaluation              End of Session Equipment Utilized During Treatment: Gait belt;Rolling walker;Back brace Nurse Communication: Mobility status  Activity Tolerance: Patient tolerated treatment well Patient left: in chair;with call bell/phone within reach;with family/visitor present;with nursing/sitter in room   Time: 0952-1039 OT Time  Calculation (min): 47 min Charges:  OT General Charges $OT Visit: 1 Procedure OT Evaluation $Initial OT Evaluation Tier I: 1 Procedure OT Treatments $Self Care/Home Management : 8-22 mins $Therapeutic Activity: 8-22 mins G-Codes:    [name], [name] 12/01/2013, 11:18 AM  12/01/2013 [name], Shawntae Lowy Elizabeth OTR/L Pager [phone] Office [phone]

## 2013-12-01 NOTE — Evaluation (Signed)
Physical Therapy Evaluation Patient Details Name: Mark Joseph MRN: [id number] DOB: 09/27/1921 Today's Date: 12/01/2013   History of Present Illness  Pt s/p L2 canal and bilateral L2 root decompression, posterolateral arthrodesis L1-3, morselized allograft, autograft ,Lumbar One through Lumbar Three posterior lateral instrumentation , globus pedicle screws, augmented with methylmethacrylate, Spire plate placement L1-L3.   Pt admitted in June 2015 s/p L2 copectomy and L1-3 arthrodesis due to L2 compression fx. Pt then went to SNF for rehab and has now been home since July 6th per pt's wife report.   Clinical Impression   Pt admitted with/for lumbar fusion due to L2 compression fx.  Pt currently limited functionally due to the problems listed. ( See problems list.)   Pt will benefit from PT to maximize function and safety in order to get ready for next venue listed below.      Follow Up Recommendations SNF;Other (comment);Supervision for mobility/OOB (expect that wife and pt will decline and decide to go home)    Equipment Recommendations  None recommended by PT    Recommendations for Other Services       Precautions / Restrictions Precautions Precautions: Back;Fall Precaution Comments: Reviewed 3/3 back precautions. Required Braces or Orthoses: Spinal Brace Spinal Brace: Lumbar corset;Applied in sitting position      Mobility  Bed Mobility Overal bed mobility: Needs Assistance Bed Mobility: Rolling;Sidelying to Sit Rolling: Min assist Sidelying to sit: +2 for physical assistance;Mod assist       General bed mobility comments: Assist to support transition of trunk OOB and to assist with initial scooting of hips toward EOB.  Transfers Overall transfer level: Needs assistance Equipment used: Rolling walker (2 wheeled) Transfers: Sit to/from Stand Sit to Stand: +2 physical assistance;Mod assist Stand pivot transfers: Min assist       General transfer comment: +2 assist for  power up and for support while transitioning UEs from armrests to RW.  Ambulation/Gait Ambulation/Gait assistance: Min assist Ambulation Distance (Feet): 5 Feet (then additional 30 feet after rest) Assistive device: Rolling walker (2 wheeled) Gait Pattern/deviations: Step-through pattern;Decreased step length - left;Decreased stance time - right;Decreased stride length;Shuffle;Trunk flexed Gait velocity: decreased Gait velocity interpretation: Below normal speed for age/gender General Gait Details: Weak kneed gait, flexed posture, quick to fatigue with more difficulty WB on R LE or advancing L LE  Stairs            Wheelchair Mobility    Modified Rankin (Stroke Patients Only)       Balance Overall balance assessment: Needs assistance Sitting-balance support: Feet supported;Single extremity supported Sitting balance-Leahy Scale: Fair Sitting balance - Comments: can not maintain balance against a challenge such as uneven surface of the bed unless situated just right.  Tending to list posteriorly in post. tilt today. Postural control: Posterior lean Standing balance support: Bilateral upper extremity supported Standing balance-Leahy Scale: Poor                               Pertinent Vitals/Pain Definitely more uncomfortable than last time.    Home Living Family/patient expects to be discharged to:: Private residence Living Arrangements: Spouse/significant other Available Help at Discharge: Family;Available 24 hours/day Type of Home: House Home Access: Ramped entrance Entrance Stairs-Rails: None Entrance Stairs-Number of Steps: 2 Home Layout: Two level;Able to live on main level with bedroom/bathroom Home Equipment: Bedside commode;Cane - quad;Cane - single point;Walker - 2 wheels;Shower seat;Adaptive equipment (bed rail)  Prior Function Level of Independence: Needs assistance   Gait / Transfers Assistance Needed: supervision with RW for past month at  home  ADL's / Homemaking Assistance Needed: occasional min assist from wife for ADLs over past month  Comments: Prior to initial fall in March, pt was independent.     Hand Dominance        Extremity/Trunk Assessment   Upper Extremity Assessment: Generalized weakness           Lower Extremity Assessment: Generalized weakness;RLE deficits/detail RLE Deficits / Details: weak at right knee due to orthopedic problems    Cervical / Trunk Assessment: Kyphotic  Communication   Communication: No difficulties;HOH  Cognition Arousal/Alertness: Awake/alert Behavior During Therapy: WFL for tasks assessed/performed Overall Cognitive Status: Within Functional Limits for tasks assessed                      General Comments General comments (skin integrity, edema, etc.): Reinforced basic back education, but emphasis on mobility today    Exercises        Assessment/Plan    PT Assessment Patient needs continued PT services  PT Diagnosis Difficulty walking;Generalized weakness   PT Problem List Decreased strength;Decreased range of motion;Decreased activity tolerance;Decreased balance;Decreased mobility;Pain;Decreased knowledge of precautions  PT Treatment Interventions DME instruction;Gait training;Functional mobility training;Therapeutic activities;Patient/family education   PT Goals (Current goals can be found in the Care Plan section) Acute Rehab PT Goals Patient Stated Goal: to go home PT Goal Formulation: With patient/family Time For Goal Achievement: 12/08/13 Potential to Achieve Goals: Good    Frequency Min 5X/week   Barriers to discharge        Co-evaluation PT/OT/SLP Co-Evaluation/Treatment: Yes Reason for Co-Treatment: Complexity of the patient's impairments (multi-system involvement) PT goals addressed during session: Mobility/safety with mobility         End of Session Equipment Utilized During Treatment: Back brace Activity Tolerance: Patient  limited by fatigue Patient left: in chair;with call bell/phone within reach;with family/visitor present;with chair alarm set Nurse Communication: Mobility status         Time: 1001-1039 PT Time Calculation (min): 38 min   Charges:     PT Treatments $Gait Training: 8-22 mins $Self Care/Home Management: 8-22   PT G Codes:          Waleska Buttery, Eliseo Gum 12/01/2013, 12:55 PM 12/01/2013  Kalaeloa Bing, PT [phone]  (pager)

## 2013-12-02 MED ORDER — MAGNESIUM CITRATE PO SOLN
1.0000 | Freq: Once | ORAL | Status: AC
  Administered 2013-12-02: 1 via ORAL
  Filled 2013-12-02: qty 296

## 2013-12-02 NOTE — Progress Notes (Signed)
Physical Therapy Treatment Patient Details Name: Mark Joseph MRN: [id number] DOB: 1921-09-15 Today's Date: 12/02/2013    History of Present Illness Pt s/p L2 canal and bilateral L2 root decompression, posterolateral arthrodesis L1-3, morselized allograft, autograft ,Lumbar One through Lumbar Three posterior lateral instrumentation , globus pedicle screws, augmented with methylmethacrylate, Spire plate placement R7-E0.   Pt admitted in June 2015 s/p L2 copectomy and L1-3 arthrodesis due to L2 compression fx. Pt then went to SNF for rehab and has now been home since July 6th per pt's wife report.     PT Comments    Pt progressing slowly.  He is more fatigued today during mobility.  Spent time with pt and wife giving them activities to do in bed and chair to improve mobility.  Gave them the ideal progression of activity and asked them to some help from staff over weekend.   Follow Up Recommendations  SNF;Other (comment);Supervision for mobility/OOB     Equipment Recommendations  None recommended by PT    Recommendations for Other Services       Precautions / Restrictions Precautions Precautions: Back;Fall Precaution Comments: Reviewed back precautions Required Braces or Orthoses: Spinal Brace Spinal Brace: Lumbar corset;Applied in sitting position Restrictions Weight Bearing Restrictions: No    Mobility  Bed Mobility Overal bed mobility: Needs Assistance Bed Mobility: Rolling;Sidelying to Sit Rolling: Min assist Sidelying to sit: Mod assist     Sit to sidelying: Mod assist General bed mobility comments: cues for technique. Truncal assist  Transfers Overall transfer level: Needs assistance Equipment used: Rolling walker (2 wheeled) Transfers: Sit to/from [facility] Sit to Stand: Mod assist (max assist with fatigue) Stand pivot transfers: Mod assist;+2 safety/equipment       General transfer comment: cues for technique/hand  placement.  Ambulation/Gait Ambulation/Gait assistance: Mod assist;Min assist (mod with fatigue) Ambulation Distance (Feet): 6 Feet (to chair then 35 feet out in hall with RW) Assistive device: Rolling walker (2 wheeled) Gait Pattern/deviations: Step-through pattern;Trunk flexed;Shuffle;Staggering right Gait velocity: decreased   General Gait Details: Weak need gait, worsening with fatigue; flexed posture.   Stairs            Wheelchair Mobility    Modified Rankin (Stroke Patients Only)       Balance Overall balance assessment: Needs assistance Sitting-balance support: No upper extremity supported Sitting balance-Leahy Scale: Fair Sitting balance - Comments: can not maintain balance against a challenge such as uneven surface of the bed unless situated just right.  Tending to list posteriorly in post. tilt today.       Standing balance comment: tendency to list posterirly on intial stand and then weak kneed after that                    Cognition Arousal/Alertness: Awake/alert Behavior During Therapy: WFL for tasks assessed/performed Overall Cognitive Status:  (with slowed processing esp, with fatigue)                      Exercises      General Comments General comments (skin integrity, edema, etc.): pt and wife reinfoced in back care, donning the brace,  expectations for progression of activity, log roll, lifting restrictions.  Pt's wife mildly upset that pt's ideal goal of mobility not met by staff yesterday      Pertinent Vitals/Pain     Home Living  Prior Function            PT Goals (current goals can now be found in the care plan section) Acute Rehab PT Goals Patient Stated Goal: not stated PT Goal Formulation: With patient/family Time For Goal Achievement: 12/08/13 Potential to Achieve Goals: Good Progress towards PT goals: Progressing toward goals    Frequency  Min 5X/week    PT Plan Current  plan remains appropriate    Co-evaluation             End of Session   Activity Tolerance: Patient limited by fatigue;Patient tolerated treatment well Patient left: in chair;with call bell/phone within reach;with family/visitor present;with chair alarm set     Time: 0940-1022 PT Time Calculation (min): 42 min  Charges:  $Gait Training: 8-22 mins $Therapeutic Activity: 8-22 mins $Self Care/Home Management: 8-22                    G Codes:      Mottinger, Kenneth V 12/02/2013, 5:10 PM 12/02/2013  Ken Mottinger, PT [phone]  (pager) 

## 2013-12-02 NOTE — Progress Notes (Signed)
Occupational Therapy Treatment Patient Details Name: Mark Joseph MRN: [id number] DOB: 12/17/1921 Today's Date: 12/02/2013    History of present illness Pt s/p L2 canal and bilateral L2 root decompression, posterolateral arthrodesis L1-3, morselized allograft, autograft ,Lumbar One through Lumbar Three posterior lateral instrumentation , globus pedicle screws, augmented with methylmethacrylate, Spire plate placement L1-L3.   Pt admitted in June 2015 s/p L2 copectomy and L1-3 arthrodesis due to L2 compression fx. Pt then went to SNF for rehab and has now been home since July 6th per pt's wife report.    OT comments  Recommending SNF for additional rehab. Pt continuing to require +2 physical assistance for transfers. Pt practiced with AE for LB dressing during session.   Follow Up Recommendations  SNF;Supervision/Assistance - 24 hour    Equipment Recommendations  None recommended by OT    Recommendations for Other Services      Precautions / Restrictions Precautions Precautions: Back;Fall Precaution Comments: Reviewed back precautions Required Braces or Orthoses: Spinal Brace Spinal Brace: Lumbar corset;Applied in sitting position Restrictions Weight Bearing Restrictions: No       Mobility Bed Mobility Overal bed mobility: Needs Assistance Bed Mobility: Rolling;Sidelying to Sit Rolling: +2 for physical assistance;Max assist Sidelying to sit: +2 for physical assistance;Mod assist       General bed mobility comments: cues for technique. Assistance with trunk to get to sitting EOB.   Transfers Overall transfer level: Needs assistance Equipment used: Rolling walker (2 wheeled) Transfers: Sit to/from UGI CorporationStand;Stand Pivot Transfers Sit to Stand: +2 physical assistance;Max assist Stand pivot transfers: +2 physical assistance;Max assist       General transfer comment: cues for technique/hand placement.    Balance                                   ADL Overall  ADL's : Needs assistance/impaired         Upper Body Bathing: Set up;Supervision/ safety;Sitting   Lower Body Bathing: Moderate assistance;Sitting/lateral leans (washed peri area and tops of legs-OT washed lower legs)   Upper Body Dressing : Maximal assistance;Sitting (brace)       Toilet Transfer: +2 for physical assistance;Maximal assistance;Stand-pivot;RW (from bed to chair)           Functional mobility during ADLs: +2 for physical assistance;Maximal assistance General ADL Comments: educated on alternative techniques for LB dressing. Pt practiced with reacher/sockaid donning/doffing socks. Educated on safety tips for home (use of bag on walker).       Vision                     Perception     Praxis      Cognition   Behavior During Therapy: [facility] for tasks assessed/performed Overall Cognitive Status: Within Functional Limits for tasks assessed (however seemed to have slow processing)                       Extremity/Trunk Assessment               Exercises     Shoulder Instructions       General Comments      Pertinent Vitals/ Pain       Pain in back with movement but not rated. Repositioned with pillow.   Home Living  Prior Functioning/Environment              Frequency Min 2X/week     Progress Toward Goals  OT Goals(current goals can now be found in the care plan section)  Progress towards OT goals: Progressing toward goals  Acute Rehab OT Goals Patient Stated Goal: not stated OT Goal Formulation: With patient/family Time For Goal Achievement: 12/15/13 Potential to Achieve Goals: Good ADL Goals Pt Will Perform Grooming: with supervision;standing Pt Will Perform Upper Body Bathing: with set-up;sitting Pt Will Perform Lower Body Bathing: with min assist;with adaptive equipment Pt Will Perform Upper Body Dressing: with set-up;sitting Pt Will Perform Lower Body  Dressing: with min assist;with adaptive equipment;sit to/from stand Pt Will Transfer to Toilet: with supervision;ambulating;bedside commode Pt Will Perform Toileting - Clothing Manipulation and hygiene: with supervision;sit to/from stand Additional ADL Goal #1: Pt will perform bed mobility at supervision level with use of bed rail as needed as precursor for EOB ADLs.  Plan Discharge plan remains appropriate    Co-evaluation                 End of Session Equipment Utilized During Treatment: Gait belt;Rolling walker;Back brace   Activity Tolerance Patient limited by fatigue;Patient limited by pain   Patient Left in chair;with call bell/phone within reach;with family/visitor present   Nurse Communication          Time: 1610-9604 OT Time Calculation (min): 36 min  Charges: OT General Charges $OT Visit: 1 Procedure OT Treatments $Self Care/Home Management : 8-22 mins $Therapeutic Activity: 8-22 mins    Earlie Raveling OTR/L [phone] 12/02/2013, 3:11 PM

## 2013-12-02 NOTE — Progress Notes (Signed)
Patient ID: Mark CroonLeon A Vegh, male   DOB: 04/23/1922, 80 y.o.   MRN: [id number] BP 92/50  Pulse 72  Temp(Src) 98.1 F (36.7 C) (Oral)  Resp 18  Ht 5\' 7"  (1.702 m)  Wt 67.087 kg (147 lb 14.4 oz)  BMI 23.16 kg/m2  SpO2 97% Alert and oriented x 4, speech is clear, fluent Moving lower extremities Mobilizing slowly Wound is clean, dry. No signs of infection Will give dulcolax, believe he is constipated

## 2013-12-03 NOTE — Progress Notes (Signed)
No issues overnight. Pt sitting in bedside chair. Has some pain in his legs primarily when moving. Continues to mobilize slowly. Had some serous drainage from wound yesterday which has stopped.  EXAM:  BP 122/56  Pulse 76  Temp(Src) 98.7 F (37.1 C) (Oral)  Resp 18  Ht 5\' 7"  (1.702 m)  Wt 67.087 kg (147 lb 14.4 oz)  BMI 23.16 kg/m2  SpO2 97%  Awake, alert, oriented  Moving BLE well Wound c/d/i, no drainage seen  IMPRESSION:  80 y.o. male POD# 3 s/p L1-3 decompression/stabilization for L2 fracture - Continues to progress slowly  PLAN: - May consider d/c foley tomorrow - Cont to mobilize with PT/OT - Plan on SNF placement

## 2013-12-03 NOTE — Progress Notes (Signed)
Physical Therapy Treatment Patient Details Name: Mark Joseph MRN: [id number] DOB: 02/04/1922 Today's Date: 12/03/2013    History of Present Illness      PT Comments    Increased assist required for bed mobility and transfers today.  Pt reporting pain/tightness bilat hamstrings making sit to stand difficult.  Unable to increase ambulation distance today due to pain/fatigue.  Encouraged pt and pt's wife to have nursing staff assist pt OOB 1-2 more times today.  Follow Up Recommendations  SNF;Supervision for mobility/OOB     Equipment Recommendations  None recommended by PT    Recommendations for Other Services       Precautions / Restrictions Precautions Precautions: Back;Fall Precaution Comments: Reviewed back precautions Required Braces or Orthoses: Spinal Brace Spinal Brace: Lumbar corset;Applied in sitting position Restrictions Weight Bearing Restrictions: No    Mobility  Bed Mobility     Rolling: Min assist Sidelying to sit: +2 for physical assistance;Max assist       General bed mobility comments: pt retropulsive during sidelying to sit, difficulty engaging abdominal muscles  Transfers   Equipment used: Rolling walker (2 wheeled)   Sit to Stand: +2 physical assistance;Max assist         General transfer comment: verbal cues for hand placement and sequencing  Ambulation/Gait Ambulation/Gait assistance: Mod assist Ambulation Distance (Feet): 5 Feet Assistive device: Rolling walker (2 wheeled) Gait Pattern/deviations: Step-through pattern;Shuffle;Trunk flexed Gait velocity: decreased       Stairs            Wheelchair Mobility    Modified Rankin (Stroke Patients Only)       Balance                                    Cognition Arousal/Alertness: Awake/alert Behavior During Therapy: WFL for tasks assessed/performed Overall Cognitive Status: Within Functional Limits for tasks assessed                       Exercises      General Comments        Pertinent Vitals/Pain     Home Living                      Prior Function            PT Goals (current goals can now be found in the care plan section) Progress towards PT goals: Progressing toward goals (slowly)    Frequency  Min 5X/week    PT Plan Current plan remains appropriate    Co-evaluation             End of Session Equipment Utilized During Treatment: Back brace Activity Tolerance: Patient limited by fatigue Patient left: in chair;with call bell/phone within reach;with family/visitor present     Time: 1001-1026 PT Time Calculation (min): 25 min  Charges:  $Therapeutic Activity: 23-37 mins                    G Codes:      Mark Joseph, Mark Joseph 12/03/2013, 10:49 AM   Mark Joseph, PT  Office # [phone] Pager [phone]

## 2013-12-03 NOTE — Progress Notes (Signed)
Pt's wound draining, honeycomb dressing reinforced with ABD pad.  Will continue to monitor. Sondra ComeSilva, Bowyn Mercier M, RN 12/02/2013

## 2013-12-04 NOTE — Care Management Note (Addendum)
  Page 2 of 2   12/11/2013     2:35:37 PM CARE MANAGEMENT NOTE 12/11/2013  Patient:  Mark Joseph, Mark Joseph   Account Number:  [id number]  Date Initiated:  12/04/2013  Documentation initiated by:  Tanganika Barradas  Subjective/Objective Assessment:   Patient was admitted with lumbar instrumentation failure. Underwent L1-3 PLIF.  Lives at home with wife.  Has previously been to [facility] for rehab and has used [facility] for HHPT     Action/Plan:   Will follow for discharge needs pending PT/OT evals and physician orders.   Anticipated DC Date:     Anticipated DC Plan:  SKILLED NURSING FACILITY  In-house referral  Clinical Social Worker      DC Planning Services  CM consult      Choice offered to / List presented to:             Status of service:  Completed, signed off Medicare Important Message given?  YES (If response is "NO", the following Medicare IM given date fields will be blank) Date Medicare IM given:  12/04/2013 Medicare IM given by:  Lorne Skeens Date Additional Medicare IM given:  12/11/2013 Additional Medicare IM given by:  Lorne Skeens  Discharge Disposition:  [place]  Per UR Regulation:  Reviewed for med. necessity/level of care/duration of stay  If discussed at [place] of Stay Meetings, dates discussed:    Comments:  12/11/13 [place], MSN, CM- Met with patient and wife to provide additional Medicare IM letter. Wife with previous letter at bedside, declines additional copy.    12/08/13 [place], MSN, CM- Spoke with Dr Christella Noa to discuss discharge planning.  CM discussed wife's concerns regarding patient's progress and her therapy expectation of the acute and post-acute settings. CM, nursing staff and PT have all attempted to address patient's concerns and understanding of therapy expectations on previous encounters.    Per CSW, patient's wife declined to begin paperwork at [facility] as she feels that patient will not  be ready for discharge until next week. Dr  Christella Noa plans to speak with patient's wife via phone (not currently available at bedside) to discuss medical readiness and clarify therapy concerns.  Per conversation with Dr Christella Noa, the need for further therapy is patient's primary issue at this time and he is otherwise medically stable.  He is aware that patient has [facility] bed available at this time. CSW was updated on conversation.   12/07/13 [place], MSN, CM- Met with patient and wife to provide additional Medicare IM letter. Patient's wife states that she has heard good things about the rehab program at [facility] and would like to know if that is an option.  Message left to inform CSW of interest in [place].  Patient's wife with multiple concerns regarding therapy while hospitalized.  These concerns were passed along to PT assigned to patient today.   12/04/13 [place], MSN, CM- Met with patient and wife to verify they are interested in SNF at discharge, as they had previously stated they may be interested in home health.  Per wife, patient is not well enough to go directly home and they would like to go back to [facility] for short-term SNF rehab.  CSW was notified. RN updated.  Medicare IM letter provided.

## 2013-12-04 NOTE — Progress Notes (Signed)
Physical Therapy Treatment Patient Details Name: Mark Joseph MRN: [id number] DOB: [birth year] Today's Date: 12/04/2013    History of Present Illness Pt s/p L2 canal and bilateral L2 root decompression, posterolateral arthrodesis L1-3, morselized allograft, autograft ,Lumbar One through Lumbar Three posterior lateral instrumentation , globus pedicle screws, augmented with methylmethacrylate, Spire plate placement L1-L3.   Pt admitted in June 2015 s/p L2 copectomy and L1-3 arthrodesis due to L2 compression fx. Pt then went to SNF for rehab and has now been home since July 6th per pt's wife report.     PT Comments    Slow to progress.  Working on Scientific laboratory technician  Follow Up Recommendations  SNF;Supervision for mobility/OOB     Equipment Recommendations  None recommended by PT    Recommendations for Other Services Rehab consult     Precautions / Restrictions Precautions Precautions: Back;Fall Precaution Comments: Reviewed back precautions Required Braces or Orthoses: Spinal Brace Restrictions Weight Bearing Restrictions: No    Mobility  Bed Mobility Overal bed mobility: Needs Assistance Bed Mobility: Rolling;Sidelying to Sit Rolling: Mod assist Sidelying to sit: Max assist       General bed mobility comments: slow moving and unable to build momentum;  cues for technique, significant truncal assist  Transfers Overall transfer level: Needs assistance Equipment used: Rolling walker (2 wheeled) Transfers: Sit to/from Stand Sit to Stand: Mod assist;+2 safety/equipment Stand pivot transfers: Mod assist       General transfer comment: verbal cues for hand placement and sequencing; assist to come forward and steadying assist,  extra assist to get more erect.  Ambulation/Gait Ambulation/Gait assistance: Mod assist Ambulation Distance (Feet): 35 Feet Assistive device: Rolling walker (2 wheeled) Gait Pattern/deviations: Step-through pattern;Decreased  step length - right;Decreased step length - left;Decreased stride length;Trunk flexed (bil knee flexion) Gait velocity: decreased   General Gait Details: weak gait with short step lengths and progressively more flexed posture.   Stairs            Wheelchair Mobility    Modified Rankin (Stroke Patients Only)       Balance     Sitting balance-Leahy Scale: Fair Sitting balance - Comments: can not maintain balance against a challenge such as uneven surface of the bed unless situated just right.  Tending to list posteriorly in post. tilt today.     Standing balance-Leahy Scale: Poor                      Cognition Arousal/Alertness: Awake/alert Behavior During Therapy: WFL for tasks assessed/performed Overall Cognitive Status: Within Functional Limits for tasks assessed                      Exercises      General Comments        Pertinent Vitals/Pain     Home Living                      Prior Function            PT Goals (current goals can now be found in the care plan section) Acute Rehab PT Goals PT Goal Formulation: With patient/family Time For Goal Achievement: 12/08/13 Potential to Achieve Goals: Good Progress towards PT goals: Progressing toward goals    Frequency  Min 5X/week    PT Plan Current plan remains appropriate    Co-evaluation             End of Session  Equipment Utilized During Treatment: Back brace Activity Tolerance: Patient tolerated treatment well;Patient limited by fatigue Patient left: in chair;with call bell/phone within reach;with family/visitor present     Time: 1610-96041550-1614 PT Time Calculation (min): 24 min  Charges:  $Gait Training: 8-22 mins $Therapeutic Activity: 8-22 mins                    G Codes:      [name], [name] 12/04/2013, 4:28 PM 12/04/2013  [name], PT [phone]  (pager)

## 2013-12-04 NOTE — Progress Notes (Addendum)
Physical Therapy Treatment Patient Details Name: Mark Joseph MRN: [id number] DOB: 09-11-1921 Today's Date: 12/04/2013    History of Present Illness Pt s/p L2 canal and bilateral L2 root decompression, posterolateral arthrodesis L1-3, morselized allograft, autograft ,Lumbar One through Lumbar Three posterior lateral instrumentation , globus pedicle screws, augmented with methylmethacrylate, Spire plate placement L1-L3.   Pt admitted in June 2015 s/p L2 copectomy and L1-3 arthrodesis due to L2 compression fx. Pt then went to SNF for rehab and has now been home since July 6th per pt's wife report.     PT Comments    Progressing slowly.  Not getting up enough ideally, so will take longer to progress.  Have given wife and pt some simple sitting exercise to keep pt from getting stiff.  Follow Up Recommendations  SNF;Supervision for mobility/OOB     Equipment Recommendations  None recommended by PT    Recommendations for Other Services Rehab consult     Precautions / Restrictions Precautions Precautions: Back;Fall Precaution Comments: Reviewed back precautions Required Braces or Orthoses: Spinal Brace Spinal Brace: Lumbar corset;Applied in sitting position Restrictions Weight Bearing Restrictions: No    Mobility  Bed Mobility               General bed mobility comments: Already in chair  Transfers Overall transfer level: Needs assistance Equipment used: Rolling walker (2 wheeled) Transfers: Sit to/from Stand Sit to Stand: Mod assist;+2 physical assistance;+2 safety/equipment Stand pivot transfers: Mod assist;+2 safety/equipment       General transfer comment: verbal cues for hand placement and sequencing; assist to come forward and steadying assist,  extra assist to get more erect.  Ambulation/Gait Ambulation/Gait assistance: Mod assist;+2 safety/equipment Ambulation Distance (Feet): 8 Feet (14', 25' and 25' respectively with rests in between using RW) Assistive  device: Rolling walker (2 wheeled) Gait Pattern/deviations: Step-through pattern;Trunk flexed;Decreased step length - right;Decreased step length - left;Decreased stride length;Shuffle Gait velocity: decreased   General Gait Details: weak gait with short step lengths and progressively more flexed posture.   Stairs            Wheelchair Mobility    Modified Rankin (Stroke Patients Only)       Balance Overall balance assessment: Needs assistance Sitting-balance support: Feet supported;No upper extremity supported;Single extremity supported Sitting balance-Leahy Scale: Fair Sitting balance - Comments: can not maintain balance against a challenge such as uneven surface of the bed unless situated just right.  Tending to list posteriorly in post. tilt today.   Standing balance support: Bilateral upper extremity supported Standing balance-Leahy Scale: Poor                      Cognition Arousal/Alertness: Awake/alert Behavior During Therapy: WFL for tasks assessed/performed Overall Cognitive Status: Within Functional Limits for tasks assessed                      Exercises      General Comments General comments (skin integrity, edema, etc.): Reinforcing to pt and family our mobility expectations.  Helping them understand reasons why pt is not doing as well as they expected.      Pertinent Vitals/Pain C/o leg pain bilaterally    Home Living                      Prior Function            PT Goals (current goals can now be found in the care plan  section) Acute Rehab PT Goals Patient Stated Goal: not stated PT Goal Formulation: With patient/family Time For Goal Achievement: 12/08/13 Potential to Achieve Goals: Good Progress towards PT goals: Progressing toward goals    Frequency  Min 5X/week    PT Plan Current plan remains appropriate    Co-evaluation             End of Session Equipment Utilized During Treatment: Back  brace Activity Tolerance: Patient tolerated treatment well;Patient limited by fatigue Patient left: in chair;with call bell/phone within reach;with family/visitor present     Time: 7829-56211147-1215 PT Time Calculation (min): 28 min  Charges:  $Gait Training: 8-22 mins $Therapeutic Activity: 8-22 mins                    G Codes:      Matther Labell, [name] 12/04/2013, 1:29 PM 12/04/2013  [place] BingKen Rueben Kassim, PT [phone]  (pager)

## 2013-12-04 NOTE — Clinical Social Work Note (Signed)
CSW actively working on SNF placement at pt and pt's wife request. Full assessment to follow. CSW will continue to follow and assist with discharge planning needs.  Marcelline DeistEmily Darral Rishel, MSW, [facility] Licensed Clinical Social Worker [phone] and [phone] [phone]

## 2013-12-04 NOTE — Progress Notes (Signed)
Patient ID: Mark CroonLeon A Leaming, male   DOB: 11/07/1921, 80 y.o.   MRN: [id number] BP 130/69  Pulse 76  Temp(Src) 97.8 F (36.6 C) (Oral)  Resp 20  Ht 5\' 7"  (1.702 m)  Wt 67.087 kg (147 lb 14.4 oz)  BMI 23.16 kg/m2  SpO2 96% Alert and oriented x 4 Exam is stable.  Progressing slowly, will be headed to a snf. Continue with pt, ot.

## 2013-12-04 NOTE — Progress Notes (Signed)
UR complete.  Chantrice Hagg RN, MSN 

## 2013-12-05 NOTE — Progress Notes (Signed)
[facility] and social worker received insurance authorization for when patient is medically ready.  Maree KrabbeLindsay Arminda Foglio, MSW, Theresia MajorsLCSWA [phone]

## 2013-12-05 NOTE — Clinical Social Work Psychosocial (Signed)
Clinical Social Work Department BRIEF PSYCHOSOCIAL ASSESSMENT 12/05/2013  Patient:  Mark Joseph,Mark Joseph     Account Number:  [id number]     Admit date:  11/30/2013  Clinical Social Worker:  Mosie EpsteinVAUGHN,Rosario Kushner S, LCSWA  Date/Time:  12/05/2013 10:13 AM  Referred by:  Physician  Date Referred:  12/05/2013 Referred for  SNF Placement   Other Referral:   none.   Interview type:  Patient Other interview type:   Pt's wife was also present at bedside.    PSYCHOSOCIAL DATA Living Status:  WIFE Admitted from facility:   Level of care:   Primary support name:  Mark Joseph Primary support relationship to patient:  SPOUSE Degree of support available:   Strong support system.    CURRENT CONCERNS Current Concerns  Post-Acute Placement   Other Concerns:   none.    SOCIAL WORK ASSESSMENT / PLAN CSW received consult for SNF placement at time of discharge. On first meeting with pt and pt's wife, pt refused SNF placement and stated he will be returning home with wife at time of discharge.    CSW re-consulted for pt's change in discharge disposition. Pt and pt's wife state pt cannot return home in current condition, and would [facility].    CSW will continue to follow and assist with discharge planning needs.   Assessment/plan status:  Psychosocial Support/Ongoing Assessment of Needs Other assessment/ plan:   none.   Information/referral to community resources:   [place]    PATIENT'S/FAMILY'S RESPONSE TO PLAN OF CARE: Pt and pt's wife are understanding and agreeable to CSW plan of care. Pt and pt's wife expressed no further questions or concerns at this time.       Marcelline DeistEmily Philisha Weinel, MSW, [facility] Licensed Clinical Social Worker [phone] and [phone] [phone]

## 2013-12-05 NOTE — Progress Notes (Signed)
Patient ID: Mark Joseph, male   DOB: 08/20/1921, 80 y.o.   MRN: [id number] BP 125/66  Pulse 80  Temp(Src) 98.1 F (36.7 C) (Oral)  Resp 18  Ht 5\' 7"  (1.702 m)  Wt 67.087 kg (147 lb 14.4 oz)  BMI 23.16 kg/m2  SpO2 99% Alert and oriented x 4, speech is clear and fluent Moving better today Wound is clean Will consult urology, and gi for esophageal motility problems

## 2013-12-05 NOTE — Clinical Social Work Note (Signed)
CSW provided appropriate clinical documents to [facility] for SNF [facility]. CSW provided handoff to covering CSW. This CSW signing off.  Marcelline DeistEmily Leslye Puccini, MSW, [facility] Licensed Clinical Social Worker [phone] and [phone] [phone]

## 2013-12-05 NOTE — Clinical Social Work Placement (Addendum)
Clinical Social Work Department CLINICAL SOCIAL WORK PLACEMENT NOTE 12/05/2013  Patient:  Mark Joseph,[name]  Account Number:  [id number] Admit date:  11/30/2013  Clinical Social Worker:  Mosie EpsteinEMILY S Jackquline Branca, LCSWA  Date/time:  12/05/2013 10:17 AM  Clinical Social Work is seeking post-discharge placement for this patient at the following level of care:   SKILLED NURSING   (*CSW will update this form in Epic as items are completed)   12/05/2013  Patient/family provided with [facility] Department of Clinical Social Work's list of facilities offering this level of care within the geographic area requested by the patient (or if unable, by the patient's family).  12/05/2013  Patient/family informed of their freedom to choose among providers that offer the needed level of care, that participate in Medicare, Medicaid or managed care program needed by the patient, have an available bed and are willing to accept the patient.  12/05/2013  Patient/family informed of [facility]' ownership interest in [facility], as well as of the fact that they are under no obligation to receive care at this facility.  PASARR submitted to EDS on 12/05/2013 PASARR number received on 12/05/2013  FL2 transmitted to all facilities in geographic area requested by pt/family on  12/05/2013 FL2 transmitted to all facilities within larger geographic area on   Patient informed that his/her managed care company has contracts with or will negotiate with  certain facilities, including the following:     Patient/family informed of bed offers received:  12/06/2013 [facility] Physician recommends and patient chooses bed at    [facility]  12/11/2013 Patient to be transferred to facility by [facility] Patient and family notified of transfer on 12/11/2013 Name of family member notified:  Louretta ParmaBeverly Wesby, pt's wife, notified at bedside.  The following physician request were  entered in Epic:   Additional Comments:  Marcelline DeistEmily Jahmad Petrich, MSW, [facility] Licensed Clinical Social Worker [phone] and [phone] [phone]

## 2013-12-05 NOTE — Progress Notes (Signed)
Physical Therapy Treatment Patient Details Name: Mark Joseph MRN: [id number] DOB: 08/01/1921 Today's Date: 12/05/2013    History of Present Illness Pt s/p L2 canal and bilateral L2 root decompression, posterolateral arthrodesis L1-3, morselized allograft, autograft ,Lumbar One through Lumbar Three posterior lateral instrumentation , globus pedicle screws, augmented with methylmethacrylate, Spire plate placement L1-L3.   Pt admitted in June 2015 s/p L2 copectomy and L1-3 arthrodesis due to L2 compression fx. Pt then went to SNF for rehab and has now been home since July 6th per pt's wife report.     PT Comments    Progressing slowly.  Emphasis on transfer technique and strengthening as well as gait with postural checks.   Follow Up Recommendations  SNF;Supervision for mobility/OOB     Equipment Recommendations  None recommended by PT    Recommendations for Other Services       Precautions / Restrictions Precautions Precaution Comments: Reviewed back precautions Required Braces or Orthoses: Spinal Brace Spinal Brace: Lumbar corset;Applied in sitting position    Mobility  Bed Mobility               General bed mobility comments: up in chair  Transfers Overall transfer level: Needs assistance Equipment used: Rolling walker (2 wheeled) Transfers: Sit to/from Stand Sit to Stand: Mod assist;+2 safety/equipment;+2 physical assistance Stand pivot transfers: Mod assist       General transfer comment: verbal cues for hand placement and sequencing; assist to come forward and steadying assist,  extra assist to get more erect.  Ambulation/Gait Ambulation/Gait assistance: Mod assist Ambulation Distance (Feet): 40 Feet (40, 50 with RW and significantly flexed posture) Assistive device: Rolling walker (2 wheeled) Gait Pattern/deviations: Step-through pattern;Trunk flexed     General Gait Details: weak gait with short step lengths and progressively more flexed  posture.   Stairs            Wheelchair Mobility    Modified Rankin (Stroke Patients Only)       Balance Overall balance assessment: Needs assistance Sitting-balance support: No upper extremity supported Sitting balance-Leahy Scale: Fair Sitting balance - Comments: can not maintain balance against a challenge such as uneven surface of the bed unless situated just right.  Tending to list posteriorly in post. tilt today.   Standing balance support: Bilateral upper extremity supported Standing balance-Leahy Scale: Poor                      Cognition Arousal/Alertness: Awake/alert Behavior During Therapy: WFL for tasks assessed/performed Overall Cognitive Status: Within Functional Limits for tasks assessed                      Exercises General Exercises - Lower Extremity Hip Flexion/Marching: AROM;Strengthening;Both;10 reps;Other (comment) (lightly resisted flexion and extension) Other Exercises Other Exercises: pt and wife work on ankle pumps and toe/heel raises as well as LAQ    General Comments General comments (skin integrity, edema, etc.): Reinforced all education and discussed pt's limitations and expectations of slower progress.      Pertinent Vitals/Pain     Home Living                      Prior Function            PT Goals (current goals can now be found in the care plan section) Acute Rehab PT Goals PT Goal Formulation: With patient/family Time For Goal Achievement: 12/08/13 Potential to Achieve Goals: Good Progress towards  PT goals: Progressing toward goals    Frequency  Min 5X/week    PT Plan Current plan remains appropriate    Co-evaluation             End of Session Equipment Utilized During Treatment: Back brace Activity Tolerance: Patient tolerated treatment well;Patient limited by fatigue Patient left: in chair;with call bell/phone within reach;with family/visitor present     Time: 1130-1157 PT  Time Calculation (min): 27 min  Charges:  $Gait Training: 8-22 mins $Therapeutic Activity: 8-22 mins                    G Codes:      Bryer Cozzolino, Eliseo Gum 12/05/2013, 12:27 PM 12/05/2013  [place] Bing, PT [phone]  (pager)

## 2013-12-06 MED ORDER — APIXABAN 5 MG PO TABS
5.0000 mg | ORAL_TABLET | Freq: Two times a day (BID) | ORAL | Status: DC
  Administered 2013-12-06 – 2013-12-11 (×10): 5 mg via ORAL
  Filled 2013-12-06 (×10): qty 1

## 2013-12-06 NOTE — Consult Note (Signed)
Date: 12/06/2013               Patient Name:  Mark Joseph MRN: [id number]  DOB: 07/10/1921 Age / Sex: 80 y.o., male   PCP: Lillia MountainJohn Joseph Griffin, MD         Requesting Physician: Dr. Carmela HurtKyle L Cabbell, MD    Consulting Reason:  Esophageal dysmotility      History of Present Illness: [name] is an 80 year old man with history of atrial fibrillation on Eliquis, GERD, CVA in 2013, dysphagia s/p L2 canal and bilateral L2 root decompression, posterolateral arthrodesis L1-3 on 11/30/2013 for anterior instrumentation failure of L2 lumbar compression fracture who has been having dysphagia x 5 months. He has been having to eat very slowly and chew his food very carefully. If he does not chew his food thoroughly, he feels like he gags on it. He does not feel like food gets stuck in his esophagus. Denies chest pain, heartburn, reflux, regurgitation, nausea, vomiting, abdominal pain. His last BM was yesterday. He denies diarrhea but has occasional constipation. He has GERD and has been on a PPI for about 30 years. Has never had an EGD.  He had a modified barium swallow study on 11/24/2013 demonstrating oropharyngeal swallow ability within functional limits. No evidence of aspiration. He also had a barium swallow 11/24/2013 that demonstrates esophageal dysmotility with frequent tertiary contractions, esophageal spasm and stasis.  Meds: Current Facility-Administered Medications  Medication Dose Route Frequency Provider Last Rate Last Dose  . 0.9 %  sodium chloride infusion  250 mL Intravenous Continuous Carmela HurtKyle L Cabbell, MD      . 0.9 % NaCl with KCl 20 mEq/ L  infusion   Intravenous Continuous Carmela HurtKyle L Cabbell, MD      . acetaminophen (TYLENOL) tablet 650 mg  650 mg Oral Q4H PRN Carmela HurtKyle L Cabbell, MD       Or  . acetaminophen (TYLENOL) suppository 650 mg  650 mg Rectal Q4H PRN Carmela HurtKyle L Cabbell, MD      . amiodarone (PACERONE) tablet 100 mg  100 mg Oral Once per day on Sun Sat Carmela HurtKyle L Cabbell, MD   100 mg at 12/03/13 1031    . amiodarone (PACERONE) tablet 200 mg  200 mg Oral Once per day on Mon Tue Wed Thu Fri Carmela HurtKyle L Cabbell, MD   200 mg at 12/06/13 1017  . calcium citrate (CALCITRATE - dosed in mg elemental calcium) tablet 200 mg of elemental calcium  200 mg of elemental calcium Oral BID Carmela HurtKyle L Cabbell, MD   200 mg of elemental calcium at 12/06/13 1016  . diazepam (VALIUM) tablet 5 mg  5 mg Oral Q6H PRN Carmela HurtKyle L Cabbell, MD   5 mg at 12/05/13 2202  . finasteride (PROSCAR) tablet 5 mg  5 mg Oral Daily Carmela HurtKyle L Cabbell, MD   5 mg at 12/06/13 1016  . hydrochlorothiazide (MICROZIDE) capsule 12.5 mg  12.5 mg Oral Daily Carmela HurtKyle L Cabbell, MD   12.5 mg at 12/06/13 1015  . HYDROcodone-acetaminophen (NORCO/VICODIN) 5-325 MG per tablet 1-2 tablet  1-2 tablet Oral Q4H PRN Carmela HurtKyle L Cabbell, MD   1 tablet at 12/03/13 2214  . ipratropium (ATROVENT) 0.06 % nasal spray 2 spray  2 spray Each Nare TID Carmela HurtKyle L Cabbell, MD   2 spray at 12/06/13 1018  . lisinopril (PRINIVIL,ZESTRIL) tablet 10 mg  10 mg Oral Daily Carmela HurtKyle L Cabbell, MD   10 mg at 12/06/13 1016  . menthol-cetylpyridinium (CEPACOL) lozenge 3 mg  1 lozenge Oral PRN Carmela Hurt, MD       Or  . phenol (CHLORASEPTIC) mouth spray 1 spray  1 spray Mouth/Throat PRN Carmela Hurt, MD      . metoprolol tartrate (LOPRESSOR) tablet 12.5 mg  12.5 mg Oral BID Carmela Hurt, MD   12.5 mg at 12/06/13 1016  . morphine 2 MG/ML injection 1-4 mg  1-4 mg Intravenous Q3H PRN Carmela Hurt, MD      . ondansetron (ZOFRAN) injection 4 mg  4 mg Intravenous Q4H PRN Carmela Hurt, MD      . pantoprazole (PROTONIX) EC tablet 40 mg  40 mg Oral Daily Carmela Hurt, MD   40 mg at 12/06/13 1015  . polyethylene glycol (MIRALAX / GLYCOLAX) packet 17 g  17 g Oral Daily PRN Carmela Hurt, MD      . senna (SENOKOT) tablet 8.6 mg  1 tablet Oral BID Carmela Hurt, MD   8.6 mg at 12/06/13 1017  . simvastatin (ZOCOR) tablet 20 mg  20 mg Oral q1800 Carmela Hurt, MD   20 mg at 12/05/13 1658  . sodium chloride 0.9 %  injection 3 mL  3 mL Intravenous Q12H Carmela Hurt, MD   3 mL at 12/06/13 1017  . sodium chloride 0.9 % injection 3 mL  3 mL Intravenous PRN Carmela Hurt, MD      . tamsulosin (FLOMAX) capsule 0.4 mg  0.4 mg Oral QPC breakfast Carmela Hurt, MD   0.4 mg at 12/06/13 1000    Allergies: Allergies as of 11/21/2013  . (No Known Allergies)   Past Medical History  Diagnosis Date  . Hypertension   . GERD (gastroesophageal reflux disease)   . Osteoporosis   . High cholesterol   . Stroke 01/2012    "small; affected his speech; pretty much back to normal now" (09/15/2012)  . Pneumonitis 1971  . Arthritis     "knees; right is worse" (09/15/2012)  . Dysphagia     had barium swallow done 11/29/13. Being followed by Dr. Emeline Darling  . Atrial fibrillation     controlled by metoprolol and amiodarone   Past Surgical History  Procedure Laterality Date  . Appendectomy  1953  . Inguinal hernia repair Bilateral 09/15/2012    w/mesh Hattie Perch 09/15/2012  . Inguinal hernia repair Bilateral 09/15/2012    Procedure: HERNIA REPAIR INGUINAL ADULT BILATERAL;  Surgeon: Emelia Loron, MD;  Location: [facility] OR;  Service: General;  Laterality: Bilateral;  . Insertion of mesh Bilateral 09/15/2012    Procedure: INSERTION OF MESH;  Surgeon: Emelia Loron, MD;  Location: [facility] OR;  Service: General;  Laterality: Bilateral;  . Anterior lat lumbar fusion Left 10/12/2013    Procedure: LUMBAR TWO CORPECTOMY,LUMBAR ONE-LUMBAR THREE ARTHRODESIS.;  Surgeon: Carmela Hurt, MD;  Location: [facility];  Service: Neurosurgery;  Laterality: Left;   History reviewed. No pertinent family history. Denies family history of GI cancers.   History   Social History  . Marital Status: Married    Spouse Name: N/A    Number of Children: N/A  . Years of Education: N/A   Occupational History  . Not on file.   Social History Main Topics  . Smoking status: Former Smoker -- 0.50 packs/day for 25 years    Types: Cigarettes    Quit date:  05/11/1968  . Smokeless tobacco: Never Used  . Alcohol Use: No  . Drug Use: No  . Sexual Activity:  Not on file   Other Topics Concern  . Not on file   Social History Narrative  . No narrative on file    Review of Systems: Constitutional: no fevers/chills Eyes: no vision changes Ears, nose, mouth, throat, and face: no cough Respiratory: no shortness of breath Cardiovascular: no chest pain Gastrointestinal: no nausea/vomiting, no abdominal pain, no diarrhea Genitourinary: no dysuria, no hematuria Integument: no rash Hematologic/lymphatic: no bleeding/bruising, no edema Musculoskeletal:+arthralgias, no myalgias Neurological: no paresthesias, no weakness  Physical Exam: Blood pressure 136/71, pulse 82, temperature 97.2 F (36.2 C), temperature source Oral, resp. rate 20, height 5\' 7"  (1.702 m), weight 147 lb 14.4 oz (67.087 kg), SpO2 97.00%. General: NAD, sitting in chair HEENT: /AT, mmm, sclera anicteric Neck: supple, trachea midline Chest: CTAB, no wheezes CV: RRR Abdomen: soft, nontender, mildly distended Extremities: no edema Neuro: Alert and oriented x 3, no gross deficits Psych: appropriate mood and affect   Assessment, Plan, & Recommendations: 80 year old man with history of atrial fibrillation on Eliquis, GERD, CVA in 2013, dysphagia s/p L2 canal and bilateral L2 root decompression, posterolateral arthrodesis L1-3 on 11/30/2013 for anterior instrumentation failure of L2 lumbar compression fracture who has been having dysphagia x 5 months. Barium swallow 11/24/2013 that demonstrates esophageal dysmotility with frequent tertiary contractions, esophageal spasm and stasis. Symptoms do not correlate with findings from barium swallow and sound more like oropharyngeal transfer problems related to the texture/consistency of the food bolus. Esophageal dysmotility seen likely age related and could be secondary to longstanding GERD.   Recommendations: -Mr. Zeitz will benefit from  modification of his diet to include more foods that are moist and soft. He was also instructed to take small sips of liquids with foods that he finds more trouble with. -No acute intervention at this time  Patient seen and discussed with attending physician, Dr. Evette Cristal  Signed: Griffin Basil, MD 12/06/2013, 1:23 PM

## 2013-12-06 NOTE — Progress Notes (Signed)
Patient ID: Mark Joseph, male   DOB: 09/30/1921, 80 y.o.   MRN: [id number] BP 136/71  Pulse 82  Temp(Src) 97.2 F (36.2 C) (Oral)  Resp 20  Ht 5\' 7"  (1.702 m)  Wt 67.087 kg (147 lb 14.4 oz)  BMI 23.16 kg/m2  SpO2 97% Alert and oriented x 4, speech is clear and fluent perrl Moving all extremities  Wound is clean, dry, no signs of infection Needs intense physical therapy.  Will restart eliquis

## 2013-12-06 NOTE — Progress Notes (Signed)
Occupational Therapy Treatment Patient Details Name: Mark Joseph MRN: [id number] DOB: 1921/06/25 Today's Date: 12/06/2013    History of present illness Pt s/p L2 canal and bilateral L2 root decompression, posterolateral arthrodesis L1-3, morselized allograft, autograft ,Lumbar One through Lumbar Three posterior lateral instrumentation , globus pedicle screws, augmented with methylmethacrylate, Spire plate placement L1-L3.   Pt admitted in June 2015 s/p L2 copectomy and L1-3 arthrodesis due to L2 compression fx. Pt then went to SNF for rehab and has now been home since July 6th per pt's wife report.    OT comments  Pt progressing and continue to recommend SNF for rehab. Notified nurse tech of red spot on pt's buttocks.  Follow Up Recommendations  SNF;Supervision/Assistance - 24 hour    Equipment Recommendations  None recommended by OT    Recommendations for Other Services      Precautions / Restrictions Precautions Precautions: Back;Fall Precaution Comments: Reviewed back precautions Required Braces or Orthoses: Spinal Brace Spinal Brace: Lumbar corset;Applied in sitting position Restrictions Weight Bearing Restrictions: No       Mobility Bed Mobility Overal bed mobility: Needs Assistance Bed Mobility: Rolling;Sidelying to Sit Rolling: Mod assist Sidelying to sit: Max assist       General bed mobility comments: cues for technique. Assist with trunk and to scoot hips out.  Transfers Overall transfer level: Needs assistance Equipment used: Rolling walker (2 wheeled) Transfers: Sit to/from [employer] Sit to Stand: Max assist;+2 physical assistance Stand pivot transfers: Mod assist;+2 physical assistance       General transfer comment: cues for technique.     Balance                                   ADL Overall ADL's : Needs assistance/impaired     Grooming: Wash/dry face;Set up;Sitting   Upper Body Bathing: Set up;Supervision/  safety;Sitting   Lower Body Bathing: Sit to/from stand;Maximal assistance   Upper Body Dressing : Moderate assistance;Sitting (back brace)   Lower Body Dressing: Sitting/lateral leans;Moderate assistance;With adaptive equipment (socks)   Toilet Transfer: Moderate assistance;+2 for safety/equipment;Stand-pivot;RW (from bed to chair)           Functional mobility during ADLs: +2 for safety/equipment;Moderate assistance;Rolling walker (stand pivot) General ADL Comments: Pt practiced with AE and performed bathing/grooming.      Vision                     Perception     Praxis      Cognition   Behavior During Therapy: [facility] for tasks assessed/performed Overall Cognitive Status: Within Functional Limits for tasks assessed                       Extremity/Trunk Assessment               Exercises     Shoulder Instructions       General Comments      Pertinent Vitals/ Pain       Pain at incision, not rated. Repositioned.  Home Living                                          Prior Functioning/Environment              Frequency Min 2X/week  Progress Toward Goals  OT Goals(current goals can now be found in the care plan section)  Progress towards OT goals: Progressing toward goals  Acute Rehab OT Goals Patient Stated Goal: not stated OT Goal Formulation: With patient/family Time For Goal Achievement: 12/15/13 Potential to Achieve Goals: Good ADL Goals Pt Will Perform Grooming: with supervision;standing Pt Will Perform Upper Body Bathing: with set-up;sitting Pt Will Perform Lower Body Bathing: with min assist;with adaptive equipment Pt Will Perform Upper Body Dressing: with set-up;sitting Pt Will Perform Lower Body Dressing: with min assist;with adaptive equipment;sit to/from stand Pt Will Transfer to Toilet: with supervision;ambulating;bedside commode Pt Will Perform Toileting - Clothing Manipulation and hygiene:  with supervision;sit to/from stand Additional ADL Goal #1: Pt will perform bed mobility at supervision level with use of bed rail as needed as precursor for EOB ADLs.  Plan Discharge plan remains appropriate    Co-evaluation                 End of Session Equipment Utilized During Treatment: Gait belt;Rolling walker;Back brace   Activity Tolerance Patient tolerated treatment well   Patient Left in chair;with call bell/phone within reach;with chair alarm set;with family/visitor present   Nurse Communication Other (comment) (told tech about place on pt's buttocks)        Time: [id number] OT Time Calculation (min): 32 min  Charges: OT General Charges $OT Visit: 1 Procedure OT Treatments $Self Care/Home Management : 23-37 mins  Earlie RavelingStraub, Michaeleen Down L OTR/L [phone] 12/06/2013, 12:19 PM

## 2013-12-06 NOTE — Consult Note (Signed)
I had seen, and viewed, and examined this patient. I have reviewed Dr. Joaquin MusicKrall's note and discussed treatment plan with her. I do not feel any further testing is needed. Once he swallows his food he states it goes down fine. I feel this is more of a oral pharyngeal transfer problem related to dry foods. He does not have difficulty with all foods and certain foods which are moist are tolerated by him well. I have asked him to try to modify his diet to foods that are easier for him to chew and swallow. I think this is mostly related to aging.

## 2013-12-06 NOTE — Consult Note (Signed)
Urology Consult Consulting physician: Barbaraann Barthel, M.D.  CC: Inability urinate  HPI: 80 year old male status post recent lumbar decompression last week, as well as lumbar surgery in June, 2015. He has been seen in our practice, by Dr. Dwana Curd. He actually had a second opinion  appointment set up with me in June, but missed his appointment. He has BPH and urinary retention. He has been catheter dependent for several months. He had urodynamics performed in may of this year. That revealed a maximum bladder capacity of about 400 mL. There was decreased contractility of the bladder. He has cystoscopic evidence of BPH. Despite having been on Paxil medical therapy, the patient has been in retention. He is obviously weaker now that he has had 2 back operations in the past month.  The patient's catheter was recently changed during his recent surgery. He has had no issues with the catheter.  PMH: Past Medical History  Diagnosis Date  . Hypertension   . GERD (gastroesophageal reflux disease)   . Osteoporosis   . High cholesterol   . Stroke 01/2012    "small; affected his speech; pretty much back to normal now" (09/15/2012)  . Pneumonitis 1971  . Arthritis     "knees; right is worse" (09/15/2012)  . Dysphagia     had barium swallow done 11/29/13. Being followed by Dr. Emeline Darling  . Atrial fibrillation     controlled by metoprolol and amiodarone    PSH: Past Surgical History  Procedure Laterality Date  . Appendectomy  1953  . Inguinal hernia repair Bilateral 09/15/2012    w/mesh Hattie Perch 09/15/2012  . Inguinal hernia repair Bilateral 09/15/2012    Procedure: HERNIA REPAIR INGUINAL ADULT BILATERAL;  Surgeon: Emelia Loron, MD;  Location: [facility] OR;  Service: General;  Laterality: Bilateral;  . Insertion of mesh Bilateral 09/15/2012    Procedure: INSERTION OF MESH;  Surgeon: Emelia Loron, MD;  Location: [facility] OR;  Service: General;  Laterality: Bilateral;  . Anterior lat lumbar fusion Left 10/12/2013     Procedure: LUMBAR TWO CORPECTOMY,LUMBAR ONE-LUMBAR THREE ARTHRODESIS.;  Surgeon: Carmela Hurt, MD;  Location: [facility] OR[facility];  Service: Neurosurgery;  Laterality: Left;    Allergies: No Known Allergies  Medications: Prescriptions prior to admission  Medication Sig Dispense Refill  . acetaminophen (TYLENOL) 325 MG tablet Take 650 mg by mouth every 6 (six) hours as needed for moderate pain.      Marland Kitchen alendronate (FOSAMAX) 70 MG tablet Take 70 mg by mouth once a week. Sunday.  Take with a full glass of water on an empty stomach.      Marland Kitchen amiodarone (PACERONE) 200 MG tablet Take 100-200 mg by mouth daily. *takes 200mg  daily Monday through Friday, but takes 100mg  daily Saturday and Sunday*      . apixaban (ELIQUIS) 5 MG TABS tablet Take 5 mg by mouth 2 (two) times daily.      . finasteride (PROSCAR) 5 MG tablet Take 5 mg by mouth daily.      Marland Kitchen ipratropium (ATROVENT) 0.06 % nasal spray Place 2 sprays into both nostrils 3 (three) times daily as needed for rhinitis.      Marland Kitchen lisinopril-hydrochlorothiazide (PRINZIDE,ZESTORETIC) 10-12.5 MG per tablet Take 1 tablet by mouth daily.      . metoprolol tartrate (LOPRESSOR) 25 MG tablet Take 12.5 mg by mouth 2 (two) times daily.      . Multiple Minerals-Vitamins (CALCIUM CITRATE PLUS) TABS Take 1 tablet by mouth 2 (two) times daily.      Marland Kitchen  pantoprazole (PROTONIX) 40 MG tablet Take 40 mg by mouth daily.      . polyethylene glycol (MIRALAX / GLYCOLAX) packet Take 17 g by mouth daily as needed for mild constipation.       . pravastatin (PRAVACHOL) 40 MG tablet Take 40 mg by mouth daily.      . tamsulosin (FLOMAX) 0.4 MG CAPS capsule Take 0.4 mg by mouth daily after breakfast.         Social History: History   Social History  . Marital Status: Married    Spouse Name: N/A    Number of Children: N/A  . Years of Education: N/A   Occupational History  . Not on file.   Social History Main Topics  . Smoking status: Former Smoker -- 0.50 packs/day for 25 years     Types: Cigarettes    Quit date: 05/11/1968  . Smokeless tobacco: Never Used  . Alcohol Use: No  . Drug Use: No  . Sexual Activity: Not on file   Other Topics Concern  . Not on file   Social History Narrative  . No narrative on file    Family History: History reviewed. No pertinent family history.  Review of Systems: Positive: Urinary retention, weakness, decreased ability to walk Negative: *further 10 point review of systems was negative except what is listed in the HPI.  Physical Exam: @VITALS2 @ General: No acute distress.  Awake. Head:  Normocephalic.  Atraumatic. ENT:  EOMI.  Mucous membranes moist Pulmonary: Equal effort bilaterally.   Skin:  Normal turgor.  No visible rash. Extremity: No gross deformity of bilateral upper extremities.  No gross deformity of    bilateral lower extremities. Neurologic: Alert. Appropriate mood.    Studies:  No results found for this basename: HGB, WBC, PLT,  in the last 72 hours  No results found for this basename: NA, K, CL, CO2, BUN, CREATININE, CALCIUM, MAGNESIUM, GFRNONAA, GFRAA,  in the last 72 hours   No results found for this basename: PT, INR, APTT,  in the last 72 hours   No components found with this basename: ABG,     Assessment:  1. BPH. The patient has failed medical therapy and is now catheter dependent  2. Urinary retention. Multifactorial-he does have decreased compliance of his bladder, and obstructive prostate, but he is also 89 and weak from recent surgery.  Plan: I spoke with the patient and his wife today about further management. He has had a thorough evaluation, including physical exam, cystoscopy and urodynamics by Dr. Berneice HeinrichManny. I do think that he might be helped by eventual TURP, but he is in no condition for that at the present time, and I would imagine he would take several months for him to recover from his current surgery. I do agree with Dr. Berneice HeinrichManny that surgery would most likely be successful, but  there would be a risk of failure/inability to void because of his weakness and his decreased bladder contractility. Other treatment options include leaving the catheter in long-term versus teaching him in and out catheterization. Certainly, I&O cath is an eventual possibility, but seeing that he is so weak at the present time, I think the training should wait. The patient will be going to a skilled care facility for rehabilitation. I think it worthwhile for them to come back to see me in a couple months. All questions were answered.    [phone]

## 2013-12-06 NOTE — Progress Notes (Signed)
Physical Therapy Treatment Patient Details Name: Mark Joseph MRN: [id number] DOB: 05/12/1921 Today's Date: 12/06/2013    History of Present Illness Pt s/p L2 canal and bilateral L2 root decompression, posterolateral arthrodesis L1-3, morselized allograft, autograft ,Lumbar One through Lumbar Three posterior lateral instrumentation , globus pedicle screws, augmented with methylmethacrylate, Spire plate placement L1-L3.   Pt admitted in June 2015 s/p L2 copectomy and L1-3 arthrodesis due to L2 compression fx. Pt then went to SNF for rehab and has now been home since July 6th per pt's wife report.     PT Comments    Starting to progress well though still slow.  Posture remains flexed, but can maintain a more erect posture longer.  Fatigue still happens easily, but pt can last within treatment session longer.  Follow Up Recommendations  SNF;Supervision for mobility/OOB     Equipment Recommendations  None recommended by PT    Recommendations for Other Services       Precautions / Restrictions Precautions Precautions: Back;Fall Precaution Comments: Reviewed back precautions Required Braces or Orthoses: Spinal Brace Spinal Brace: Lumbar corset;Applied in sitting position Restrictions Weight Bearing Restrictions: No    Mobility  Bed Mobility Overal bed mobility: Needs Assistance Bed Mobility: Rolling;Sidelying to Sit Rolling: Mod assist Sidelying to sit: Max assist       General bed mobility comments: cues for technique. Assist with trunk and to scoot hips out.  Transfers Overall transfer level: Needs assistance Equipment used: Rolling walker (2 wheeled) Transfers: Sit to/from Stand Sit to Stand: Mod assist;Min assist;+2 safety/equipment (sit to stand x7 over treatment) Stand pivot transfers: Mod assist;+2 physical assistance       General transfer comment: cues for technique; assist coming forward and steadying  Ambulation/Gait Ambulation/Gait assistance: Mod  assist;Min assist;+2 safety/equipment;+2 physical assistance Ambulation Distance (Feet): 60 Feet (times 2 and 40' times 3 with rest in between) Assistive device: Rolling walker (2 wheeled) Gait Pattern/deviations: Step-through pattern;Trunk flexed;Decreased step length - left;Decreased stance time - right;Decreased stride length Gait velocity: decreased   General Gait Details: weak gait with short step lengths and progressively more flexed posture, but note beginning of ability to maintain upright posture and wallk further with being totally worn out.   Stairs            Wheelchair Mobility    Modified Rankin (Stroke Patients Only)       Balance Overall balance assessment: Needs assistance Sitting-balance support: No upper extremity supported Sitting balance-Leahy Scale: Fair Sitting balance - Comments: beginning to be able to accept minimal challenge   Standing balance support: No upper extremity supported Standing balance-Leahy Scale: Poor Standing balance comment: needs RW for support                    Cognition Arousal/Alertness: Awake/alert Behavior During Therapy: WFL for tasks assessed/performed Overall Cognitive Status: Within Functional Limits for tasks assessed                      Exercises      General Comments General comments (skin integrity, edema, etc.): Continuing to reinforce education and help pt and wife understand the slowness of his progress.      Pertinent Vitals/Pain     Home Living                      Prior Function            PT Goals (current goals can now be found  in the care plan section) Acute Rehab PT Goals Patient Stated Goal: not stated PT Goal Formulation: With patient/family Time For Goal Achievement: 12/08/13 Potential to Achieve Goals: Good Progress towards PT goals: Progressing toward goals    Frequency  Min 5X/week    PT Plan Current plan remains appropriate    Co-evaluation              End of Session Equipment Utilized During Treatment: Back brace Activity Tolerance: Patient tolerated treatment well Patient left: in chair;with call bell/phone within reach;with family/visitor present     Time: 1610-9604 PT Time Calculation (min): 38 min  Charges:  $Gait Training: 23-37 mins $Therapeutic Activity: 8-22 mins                    G Codes:      Mark Joseph, Mark Joseph 12/06/2013, 4:05 PM 12/06/2013  Mark Joseph, PT [phone]  (pager)

## 2013-12-07 NOTE — Clinical Social Work Note (Signed)
CSW met with pt and pt's wife at bedside. Pt's wife agreeable to [facility]. Pt's wife understanding of [facility] not being contracted with pt's insurance. CSW to continue to follow and assist with discharge once pt is medically stable for discharge.  Lubertha Sayres, MSW, [facility] Licensed Clinical Social Worker [phone] and [phone]

## 2013-12-07 NOTE — Progress Notes (Signed)
Physical Therapy Treatment Patient Details Name: Mark Joseph MRN: [id number] DOB: 11/21/1921 Today's Date: 12/07/2013    History of Present Illness Pt s/p L2 canal and bilateral L2 root decompression, posterolateral arthrodesis L1-3, morselized allograft, autograft ,Lumbar One through Lumbar Three posterior lateral instrumentation , globus pedicle screws, augmented with methylmethacrylate, Spire plate placement L1-L3.   Pt admitted in June 2015 s/p L2 copectomy and L1-3 arthrodesis due to L2 compression fx. Pt then went to SNF for rehab and has now been home since July 6th per pt's wife report.     PT Comments    Progressing more steadily.  Still with difficulty standing due to unable to come forward without significant assist.  Emphasis on scooting, transfers and progressive ambulation.  Follow Up Recommendations  SNF;Supervision for mobility/OOB     Equipment Recommendations  None recommended by PT    Recommendations for Other Services       Precautions / Restrictions Precautions Precautions: Back;Fall Precaution Comments: Reviewed back precautions Required Braces or Orthoses: Spinal Brace Spinal Brace: Lumbar corset;Applied in sitting position    Mobility  Bed Mobility Overal bed mobility: Needs Assistance Bed Mobility: Rolling;Sidelying to Sit Rolling: Min assist Sidelying to sit: Mod assist       General bed mobility comments: cues for technique. Assist with trunk and to scoot hips out.  Transfers Overall transfer level: Needs assistance Equipment used: Rolling walker (2 wheeled) Transfers: Sit to/from Stand Sit to Stand: Mod assist;Min assist (sit to stand x5 over treatment) Stand pivot transfers: Mod assist       General transfer comment: cues for technique; assist coming forward  and lifting  Ambulation/Gait Ambulation/Gait assistance: Min assist;+2 safety/equipment Ambulation Distance (Feet): 50 Feet (110', 105', and 60' respectively with sitting rest in  betw.) Assistive device: Rolling walker (2 wheeled) Gait Pattern/deviations: Step-through pattern;Trunk flexed;Narrow base of support Gait velocity: decreased   General Gait Details:  short step lengths and progressively more flexed posture, but pt starting to show signs of increased endurance and not quite as much trunk flexion.   Stairs            Wheelchair Mobility    Modified Rankin (Stroke Patients Only)       Balance     Sitting balance-Leahy Scale: Fair Sitting balance - Comments: beginning to be able to accept minimal challenge     Standing balance-Leahy Scale: Poor                      Cognition Arousal/Alertness: Awake/alert Behavior During Therapy: WFL for tasks assessed/performed Overall Cognitive Status: Within Functional Limits for tasks assessed                      Exercises Other Exercises Other Exercises: pt and wife continue to work on a/p, LAQ's and marching in sitting.    General Comments General comments (skin integrity, edema, etc.): pt able to scoot assymetrically and symetrically with more momentum and confidence and no assist from therapist.  Pt still has difficulty coming forward enough to bring hands to walker without forward stability of therapist.      Pertinent Vitals/Pain     Home Living                      Prior Function            PT Goals (current goals can now be found in the care plan section) Acute Rehab PT Goals  Patient Stated Goal: not stated PT Goal Formulation: With patient/family Time For Goal Achievement: 12/08/13 Potential to Achieve Goals: Good Progress towards PT goals: Progressing toward goals    Frequency  Min 5X/week    PT Plan Current plan remains appropriate    Co-evaluation             End of Session Equipment Utilized During Treatment: Back brace Activity Tolerance: Patient tolerated treatment well Patient left: in chair;with call bell/phone within reach;with  family/visitor present     Time: 4540-9811 PT Time Calculation (min): 39 min  Charges:  $Gait Training: 23-37 mins $Therapeutic Activity: 8-22 mins                    G Codes:      Jasher Barkan, Eliseo Gum 12/07/2013, 3:18 PM 12/07/2013  [place] Bing, PT [phone]  (pager)

## 2013-12-07 NOTE — Progress Notes (Signed)
Patient ID: Mark CroonLeon A Anwar, male   DOB: 10/28/1921, 80 y.o.   MRN: [id number] BP 138/72  Pulse 70  Temp(Src) 98.1 F (36.7 C) (Oral)  Resp 20  Ht 5\' 7"  (1.702 m)  Wt 67.087 kg (147 lb 14.4 oz)  BMI 23.16 kg/m2  SpO2 99% Alert and oriented x 4, speech is clear and fluent Moving lower extremities well Foley in place Possible discharge will need to discuss with family. [facility].

## 2013-12-08 NOTE — Progress Notes (Signed)
Patient ID: Ashok CroonLeon A Joseph, male   DOB: 04/13/1922, 80 y.o.   MRN: [id number] BP 106/60  Pulse 63  Temp(Src) 97.6 F (36.4 C) (Oral)  Resp 18  Ht 5\' 7"  (1.702 m)  Wt 67.087 kg (147 lb 14.4 oz)  BMI 23.16 kg/m2  SpO2 98% Alert, moving all extremities Wound is clean, dry, no signs of infection Continue pt

## 2013-12-08 NOTE — Clinical Social Work Note (Signed)
CSW [facility] stating she attempted to meet with pt's wife at bedside to complete admission paperwork for possible weekend admission. [facility] admissions liaison stated pt's wife not agreeable to completing admission paperwork at this time.   RNCM updated CSW regarding conversation with MD. CSW updated RNCM regarding information above.  CS[facility] [place] for SNF authorization to be extended throughout weekend in the event pt is discharged over weekend.   CSW to continue to follow and assist with discharge planning needs.  Marcelline DeistEmily Calysta Craigo, MSW, [facility] Licensed Clinical Social Worker [phone] and [phone] [phone]

## 2013-12-08 NOTE — Progress Notes (Signed)
Physical Therapy Treatment Patient Details Name: Mark Joseph MRN: [id number] DOB: 02-02-1922 Today's Date: 12/08/2013    History of Present Illness Mark Joseph is an 80 y.o. Male s/p posterior lumbar fusion (1 level) on 11/30/13. Pt was admitted in June and s/p Anterior lat lumbar fusion. Pt d/c to SNF for rehab and was home since 11/13/13 prior to rehospilization. PMH of HTN, GERD, dysphagia (being followed by Dr. Emeline Darling), and atrial fib.     PT Comments    Patient making gains with mobility and gait.  Agree with need for SNF at discharge.  Follow Up Recommendations  SNF;Supervision for mobility/OOB     Equipment Recommendations  None recommended by PT    Recommendations for Other Services       Precautions / Restrictions Precautions Precautions: Back;Fall Precaution Comments: Reviewed back precautions Required Braces or Orthoses: Spinal Brace Spinal Brace: Lumbar corset;Applied in sitting position Restrictions Weight Bearing Restrictions: No    Mobility  Bed Mobility Overal bed mobility: Needs Assistance Bed Mobility: Rolling;Sidelying to Sit Rolling: Min assist Sidelying to sit: Mod assist       General bed mobility comments: Verbal cues for technique.  Assist to raise trunk to sitting.  Transfers Overall transfer level: Needs assistance Equipment used: Rolling walker (2 wheeled) Transfers: Sit to/from Stand Sit to Stand: Mod assist         General transfer comment: Verbal cues for hand placement.  Cues to scoot to edge of chair before standing.  Assist to rise to standing and for balance initially - patient with posterior lean.  Ambulation/Gait Ambulation/Gait assistance: Min assist;+2 safety/equipment Ambulation Distance (Feet): 100 Feet (60' and 100' with sitting rest break between) Assistive device: Rolling walker (2 wheeled) Gait Pattern/deviations: Step-through pattern;Decreased step length - right;Decreased step length - left;Decreased stride  length;Shuffle;Trunk flexed;Narrow base of support Gait velocity: decreased Gait velocity interpretation: Below normal speed for age/gender General Gait Details: Verbal cues to stand upright and look forward during gait.  Patient with flexed posture that declines with fatigue.   Stairs            Wheelchair Mobility    Modified Rankin (Stroke Patients Only)       Balance                                    Cognition Arousal/Alertness: Awake/alert Behavior During Therapy: WFL for tasks assessed/performed Overall Cognitive Status: Within Functional Limits for tasks assessed                      Exercises Other Exercises Other Exercises: straight leg kicks from recliner to increase strength and endurance. Demonstrated to wife how to assist pt and encouraged to perform throughout the day.     General Comments        Pertinent Vitals/Pain     Home Living                      Prior Function            PT Goals (current goals can now be found in the care plan section) Progress towards PT goals: Progressing toward goals    Frequency  Min 5X/week    PT Plan Current plan remains appropriate    Co-evaluation             End of Session Equipment Utilized During Treatment: Gait belt;Back  brace Activity Tolerance: Patient limited by fatigue Patient left: in chair;with call bell/phone within reach;with chair alarm set     Time: 2956-21301015-1039 PT Time Calculation (min): 24 min  Charges:  $Gait Training: 23-37 mins                    G Codes:      Mark Joseph 12/08/2013, 2:58 PM Mark Joseph, PT, [facility] Acute Rehab Services Pager [phone]

## 2013-12-08 NOTE — Progress Notes (Signed)
Occupational Therapy Treatment Patient Details Name: Mark Joseph MRN: [id number] DOB: 07/07/1921 Today's Date: 12/08/2013    History of present illness Mark Joseph is an 80 y.o. Male s/p posterior lumbar fusion (1 level) on 11/30/13. Pt was admitted in June and s/p Anterior lat lumbar fusion. Pt d/c to SNF for rehab and was home since 11/13/13 prior to rehospilization. PMH of HTN, GERD, dysphagia (being followed by Dr. Emeline DarlingGore), and atrial fib.    OT comments  Pt seen today for functional mobility as pt desired to mobilize once more this evening before returning to bed. Pt expressed desire to improve his sit<>stand transfers, however was limited by fatigue. Pt required stand pivot transfer with max (A) to get from recliner to bed. Educated pt on not over working his body and getting enough rest and food. Pt would continue to benefit from skilled OT for increased independence.   Follow Up Recommendations  SNF;Supervision/Assistance - 24 hour    Equipment Recommendations  None recommended by OT       Precautions / Restrictions Precautions Precautions: Back;Fall Precaution Comments: Reviewed back precautions Required Braces or Orthoses: Spinal Brace Spinal Brace: Lumbar corset;Applied in sitting position Restrictions Weight Bearing Restrictions: No       Mobility Bed Mobility Overal bed mobility: Needs Assistance Bed Mobility: Rolling;Sit to Sidelying Rolling: Min assist Sidelying to sit: Mod assist     Sit to sidelying: Mod assist General bed mobility comments: (A) for Bil LEs onto bed and truncal support. VC's for technique and safety.  Transfers Overall transfer level: Needs assistance Equipment used: Rolling walker (2 wheeled) Transfers: Sit to/from [employer] CorporationStand;Stand Pivot Transfers Sit to Stand: Mod assist (pt attempted x 2 without achieving full stand) Stand pivot transfers: Max assist       General transfer comment: Pt required increased physical assistance due to fatigue.  Eventually pt required max A for stand pivot with therapist/gait belt assisted transfer as pt was unable to push through UEs and LEs to stand with RW.         ADL Overall ADL's : Needs assistance/impaired                 Upper Body Dressing : Moderate assistance;Sitting (including back brace) Upper Body Dressing Details (indicate cue type and reason): doffed lumbar corset sitting EOB with assist                   General ADL Comments: Pt and wife requested assistance to get pt up and moving once more before the weekend. Pt expressed desire to practice standing and then return to bed. With first attempt, pt demonstrated increased posterior lean and difficulty powering up to stand. Pt attempted to stand x2 with difficulty and unable to achieve full standing despite physical assist. Pt performed squat-pivot from recliner to bed with therapist assist. Educated pt on recognizing fatigue and not over doing it. Pt's wife reports that he has been doing his exercises since therapy this morning.                 Cognition  Arousal/Alertness: Awake/Alert Behavior During Therapy: WFL for tasks assessed/performed Overall Cognitive Status: Within Functional Limits for tasks assessed                                    Pertinent Vitals/ Pain       No c/o Pain; NAD. Pt fatigued.  Frequency Min 2X/week     Progress Toward Goals  OT Goals(current goals can now be found in the care plan section)  Progress towards OT goals: Not progressing toward goals - comment (due to fatigue this evening)  ADL Goals Pt Will Perform Grooming: with supervision;standing Pt Will Perform Upper Body Bathing: with set-up;sitting Pt Will Perform Lower Body Bathing: with min assist;with adaptive equipment Pt Will Perform Upper Body Dressing: with set-up;sitting Pt Will Perform Lower Body Dressing: with min assist;with adaptive equipment;sit to/from stand Pt Will Transfer to Toilet:  with supervision;ambulating;bedside commode Pt Will Perform Toileting - Clothing Manipulation and hygiene: with supervision;sit to/from stand Additional ADL Goal #1: Pt will perform bed mobility at supervision level with use of bed rail as needed as precursor for EOB ADLs.  Plan Discharge plan remains appropriate       End of Session Equipment Utilized During Treatment: Gait belt;Rolling walker;Back brace   Activity Tolerance Patient limited by fatigue   Patient Left in bed;with call bell/phone within reach;with family/visitor present;with nursing/sitter in room   Nurse Communication          Time: 1610-9604 OT Time Calculation (min): 18 min  Charges: OT General Charges $OT Visit: 1 Procedure OT Treatments $Self Care/Home Management : 8-22 mins  Rae Lips [phone] 12/08/2013, 6:33 PM

## 2013-12-08 NOTE — Progress Notes (Signed)
Occupational Therapy Treatment Patient Details Name: Mark Joseph MRN: [id number] DOB: 09/21/1921 Today's Date: 12/08/2013    History of present illness Mark Joseph is an 80 y.o. Male s/p posterior lumbar fusion (1 level) on 11/30/13. Pt was admitted in June and s/p Anterior lat lumbar fusion. Pt d/c to SNF for rehab and was home since 11/13/13 prior to rehospilization. PMH of HTN, GERD, dysphagia (being followed by Dr. Emeline DarlingGore), and atrial fib.    OT comments  Pt seen today for ADLs and therapeutic exercise. Pt presents with decreased endurance and deconditioning/weakness impairing pt's independence with ADLs. Pt participated in functional mobility (sit<>stand x5) and therapeutic exercise to increase strength and endurance. Pt is highly motivated. Provided pt with built up foam for pen to improve writing as pt was having difficulty with signing paperwork. Pt would continue to benefit from skilled OT to increase independence with ADLs.    Follow Up Recommendations  SNF;Supervision/Assistance - 24 hour    Equipment Recommendations  None recommended by OT       Precautions / Restrictions Precautions Precautions: Back;Fall Precaution Comments: Reviewed back precautions Required Braces or Orthoses: Spinal Brace Spinal Brace: Lumbar corset;Applied in sitting position Restrictions Weight Bearing Restrictions: No       Mobility Bed Mobility               General bed mobility comments: Pt sitting in recliner before/after OT session.  Transfers Overall transfer level: Needs assistance Equipment used: Rolling walker (2 wheeled) Transfers: Sit to/from Stand Sit to Stand: Min assist;Mod assist         General transfer comment: Practiced sit<>stand from recliner with VC's to scoot to edge of chair. Pt requires assistance to push up to stand. Tactile cues provided on pt's chest to stand up straight and look at TV.         ADL Overall ADL's : Needs  assistance/impaired Eating/Feeding: Set up;Sitting   Grooming: Wash/dry face;Set up;Sitting                   Toilet Transfer: Moderate assistance;+2 for safety/equipment;Stand-pivot;RW           Functional mobility during ADLs: +2 for safety/equipment;Moderate assistance;Rolling walker General ADL Comments: Pt sitting up in recliner when OT arrived. Pt was willing to mobilize and demonstrated difficulty with sit<>stand. Practiced x 5 to increase strength and endurance. Pt muscles became fatigued quickly and participated in straight leg kicks from recliner. Demonstrated to wife how to assist pt to increase strength. Pt reported dfificulty with writing. Provided pt with built up foam for pen and practiced hand writing with improved postioning in recliner. Educated pt and wife on eating sufficient protein for muscle growth and continuing to mobilize with therapy and nursing.                Cognition  Arousal/Alertness: Awake/alert Behavior During Therapy: WFL for tasks assessed/performed Overall Cognitive Status: Within Functional Limits for tasks assessed                         Exercises Other Exercises Other Exercises: straight leg kicks from recliner to increase strength and endurance. Demonstrated to wife how to assist pt and encouraged to perform throughout the day.            Pertinent Vitals/ Pain       NAD, no c/o pain         Frequency Min 2X/week     Progress  Toward Goals  OT Goals(current goals can now be found in the care plan section)  Progress towards OT goals: Progressing toward goals  ADL Goals Pt Will Perform Grooming: with supervision;standing Pt Will Perform Upper Body Bathing: with set-up;sitting Pt Will Perform Lower Body Bathing: with min assist;with adaptive equipment Pt Will Perform Upper Body Dressing: with set-up;sitting Pt Will Perform Lower Body Dressing: with min assist;with adaptive equipment;sit to/from stand Pt Will  Transfer to Toilet: with supervision;ambulating;bedside commode Pt Will Perform Toileting - Clothing Manipulation and hygiene: with supervision;sit to/from stand Additional ADL Goal #1: Pt will perform bed mobility at supervision level with use of bed rail as needed as precursor for EOB ADLs.  Plan Discharge plan remains appropriate       End of Session Equipment Utilized During Treatment: Gait belt;Rolling walker;Back brace   Activity Tolerance Patient tolerated treatment well   Patient Left in chair;with call bell/phone within reach;with family/visitor present;with chair alarm set   Nurse Communication          Time: 1610-9604 OT Time Calculation (min): 35 min  Charges: OT General Charges $OT Visit: 1 Procedure OT Treatments $Therapeutic Activity: 8-22 mins $Therapeutic Exercise: 8-22 mins  Rae Lips [phone] 12/08/2013, 12:33 PM

## 2013-12-09 NOTE — Progress Notes (Signed)
No new issues or problems. Patient's pain well controlled. Mobility still marginal for discharge.  Afebrile. Vital stable. Catheter still in place. Motor and sensory exam stable.  Progressing slowly. Continue rehabilitation efforts.

## 2013-12-09 NOTE — Progress Notes (Signed)
Physical Therapy Treatment Patient Details Name: Mark Joseph MRN: [id number] DOB: Oct 14, 1921 Today's Date: 12/09/2013    History of Present Illness Mark Joseph is an 80 y.o. Male s/p posterior lumbar fusion (1 level) on 11/30/13. Pt was admitted in June and s/p Anterior lat lumbar fusion. Pt d/c to SNF for rehab and was home since 11/13/13 prior to rehospilization. PMH of HTN, GERD, dysphagia (being followed by Dr. Emeline Darling), and atrial fib.     PT Comments    Patient making slow progress with mobility.  Wife asking for PT.  States "the doctor told us we would have PT today."  Explained to wife that we don't typically provide PT to patient on Saturdays who have a discharge plan for SNF.  Agreed to provide PT today since she was expecting services.  Explained that we will not provide therapy Sunday, but will return on Monday.    Follow Up Recommendations  SNF;Supervision for mobility/OOB     Equipment Recommendations  None recommended by PT    Recommendations for Other Services       Precautions / Restrictions Precautions Precautions: Back;Fall Required Braces or Orthoses: Spinal Brace Spinal Brace: Lumbar corset;Applied in sitting position Restrictions Weight Bearing Restrictions: No    Mobility  Bed Mobility                  Transfers Overall transfer level: Needs assistance Equipment used: Rolling walker (2 wheeled) Transfers: Sit to/from Stand Sit to Stand: Mod assist         General transfer comment: Verbal cues for hand placement.  Assist to rise to standing and for balance.  Encouraged patient to stand for a few seconds before ambulation for safety.  Ambulation/Gait Ambulation/Gait assistance: Min assist Ambulation Distance (Feet): 100 Feet (20' and 100' with sitting rest break) Assistive device: Rolling walker (2 wheeled) Gait Pattern/deviations: Step-through pattern;Decreased stride length;Shuffle;Narrow base of support;Trunk flexed Gait velocity:  decreased Gait velocity interpretation: Below normal speed for age/gender General Gait Details: Verbal cues to stand upright and look forward during gait.  Patient with flexed posture that declines with fatigue.   Stairs            Wheelchair Mobility    Modified Rankin (Stroke Patients Only)       Balance                                    Cognition Arousal/Alertness: Awake/alert Behavior During Therapy: WFL for tasks assessed/performed Overall Cognitive Status: Within Functional Limits for tasks assessed                      Exercises      General Comments        Pertinent Vitals/Pain     Home Living                      Prior Function            PT Goals (current goals can now be found in the care plan section) Progress towards PT goals: Progressing toward goals    Frequency  Min 5X/week    PT Plan Current plan remains appropriate    Co-evaluation             End of Session Equipment Utilized During Treatment: Gait belt;Back brace Activity Tolerance: Patient limited by fatigue Patient left: in chair;with call bell/phone  within reach;with chair alarm set;with family/visitor present     Time: [id number] PT Time Calculation (min): 23 min  Charges:  $Gait Training: 23-37 mins                    G Codes:      Vena AustriaDavis, Beryle Zeitz H 12/09/2013, 5:35 PM Durenda HurtSusan H. Renaldo Fiddleravis, PT, [facility] Pager [phone]

## 2013-12-10 MED ORDER — AMIODARONE HCL 100 MG PO TABS
100.0000 mg | ORAL_TABLET | ORAL | Status: DC
  Administered 2013-12-10: 100 mg via ORAL
  Filled 2013-12-10: qty 1

## 2013-12-10 MED ORDER — AMIODARONE HCL 100 MG PO TABS: 100.0000 mg | ORAL_TABLET | ORAL | Status: DC

## 2013-12-11 ENCOUNTER — Inpatient Hospital Stay (HOSPITAL_COMMUNITY): Payer: Medicare Other

## 2013-12-11 MED ORDER — HYDROCODONE-ACETAMINOPHEN 5-325 MG PO TABS: 1.0000 | ORAL_TABLET | Freq: Four times a day (QID) | ORAL | Status: DC | PRN

## 2013-12-11 MED ORDER — CYCLOBENZAPRINE HCL 10 MG PO TABS: 10.0000 mg | ORAL_TABLET | Freq: Three times a day (TID) | ORAL | Status: DC | PRN

## 2013-12-11 NOTE — Progress Notes (Signed)
[facility] [place] Packet made and given to [facility] to deliver along with patient to facility. Wife at the bedside when [facility] arrived to take patient - Patients belongings taken by wife at discharge. Patient discharged with foley.

## 2013-12-11 NOTE — Progress Notes (Signed)
Physical Therapy Treatment Patient Details Name: Mark Joseph MRN: [id number] DOB: 05/10/1922 Today's Date: 12/11/2013    History of Present Illness Mark Joseph is an 80 y.o. Male s/p posterior lumbar fusion (1 level) on 11/30/13. Pt was admitted in June and s/p Anterior lat lumbar fusion. Pt d/c to SNF for rehab and was home since 11/13/13 prior to rehospilization. PMH of HTN, GERD, dysphagia (being followed by Dr. Emeline DarlingGore), and atrial fib.     PT Comments    Pt with improved transfer and ambulation tolerance this date. Cont' to rec SNF upon d/c for maximal functional recovery.  Follow Up Recommendations  SNF;Supervision for mobility/OOB     Equipment Recommendations  None recommended by PT    Recommendations for Other Services       Precautions / Restrictions Precautions Precautions: Back;Fall Precaution Comments: Reviewed back precautions Required Braces or Orthoses: Spinal Brace Spinal Brace: Lumbar corset;Applied in sitting position Restrictions Weight Bearing Restrictions: No    Mobility  Bed Mobility Overal bed mobility: Needs Assistance Bed Mobility: Rolling;Sidelying to Sit Rolling: Supervision Sidelying to sit: Min guard       General bed mobility comments: assist for trunk elevation, increased time  Transfers Overall transfer level: Needs assistance Equipment used: Rolling walker (2 wheeled) Transfers: Sit to/from Stand Sit to Stand: Min assist         General transfer comment: v/c's for safe hand placement, increased time  Ambulation/Gait Ambulation/Gait assistance: Min assist Ambulation Distance (Feet): 75 Feet (x2) Assistive device: Rolling walker (2 wheeled) Gait Pattern/deviations: Step-through pattern;Decreased stride length;Trunk flexed Gait velocity: decreased Gait velocity interpretation: at or above normal speed for age/gender General Gait Details: Verbal cues to stand upright and look forward during gait.  Patient with flexed posture that  declines with fatigue.   Stairs            Wheelchair Mobility    Modified Rankin (Stroke Patients Only)       Balance                                    Cognition Arousal/Alertness: Awake/alert Behavior During Therapy: WFL for tasks assessed/performed Overall Cognitive Status: Within Functional Limits for tasks assessed                      Exercises      General Comments        Pertinent Vitals/Pain Denies pain    Home Living                      Prior Function            PT Goals (current goals can now be found in the care plan section) Progress towards PT goals: Progressing toward goals    Frequency  Min 5X/week    PT Plan Current plan remains appropriate    Co-evaluation             End of Session Equipment Utilized During Treatment: Gait belt;Back brace Activity Tolerance: Patient limited by fatigue Patient left: in chair;with call bell/phone within reach;with chair alarm set;with family/visitor present     Time: [id number] PT Time Calculation (min): 23 min  Charges:  $Gait Training: 8-22 mins $Therapeutic Activity: 8-22 mins                    G Codes:  Marcene Brawn 12/11/2013, 4:04 PM  Lewis Shock, PT, DPT Pager #: [phone] Office #: [phone]

## 2013-12-11 NOTE — Progress Notes (Signed)
Occupational Therapy Treatment Patient Details Name: Mark Joseph MRN: [id number] DOB: 06/19/1921 Today's Date: 12/11/2013    History of present illness Mark Joseph is an 80 y.o. Male s/p posterior lumbar fusion (1 level) on 11/30/13. Pt was admitted in June and s/p Anterior lat lumbar fusion. Pt d/c to SNF for rehab and was home since 11/13/13 prior to rehospilization. PMH of HTN, GERD, dysphagia (being followed by Dr. Emeline Darling), and atrial fib.    OT comments  Pt progressing. Pt able to ambulate to bathroom and also a little in room. Education provided during session and cues given for precautions.  Follow Up Recommendations  SNF;Supervision/Assistance - 24 hour    Equipment Recommendations  None recommended by OT    Recommendations for Other Services      Precautions / Restrictions Precautions Precautions: Back;Fall Precaution Comments: Reviewed back precautions Required Braces or Orthoses: Spinal Brace Spinal Brace: Lumbar corset;Applied in sitting position Restrictions Weight Bearing Restrictions: No       Mobility Bed Mobility Overal bed mobility: Needs Assistance Bed Mobility: Rolling;Sidelying to Sit Rolling: Supervision Sidelying to sit: Supervision          Transfers Overall transfer level: Needs assistance Equipment used: Rolling walker (2 wheeled) Transfers: Sit to/from Stand Sit to Stand: Mod assist;Max assist;Min assist         General transfer comment: varying levels of assist for transfers. Cues for technique.    Balance                                   ADL Overall ADL's : Needs assistance/impaired     Grooming: Oral care;Wash/dry face;Standing;Moderate assistance;Set up;Supervision/safety;Minimal assistance (Min-Mod A while standing)       Lower Body Bathing: Maximal assistance;Sit to/from stand (OT assisted in washing buttocks, feet, lower legs)-Pt able to move gown out of way   Upper Body Dressing : Sitting;Minimal  assistance   Lower Body Dressing: Maximal assistance;Sitting/lateral leans (socks-OT donned for pt (he wears diff socks at home))   Toilet Transfer: Moderate assistance;Ambulation;RW;BSC           Functional mobility during ADLs: Minimal assistance;Moderate assistance;Rolling walker General ADL Comments: Pt ambulated to bathroom and performed ADLs. Pt practiced with reacher-OT donned pt's sock for him but pt knows how to use sockaid. Reviewed back brace information.  Pt stood at sink for grooming tasks and was bending back quite a bit (sat down to finish oral care task).      Vision                     Perception     Praxis      Cognition   Behavior During Therapy: [facility] for tasks assessed/performed Overall Cognitive Status: Within Functional Limits for tasks assessed                       Extremity/Trunk Assessment               Exercises     Shoulder Instructions       General Comments      Pertinent Vitals/ Pain       No pain reported.  Home Living  Prior Functioning/Environment              Frequency Min 2X/week     Progress Toward Goals  OT Goals(current goals can now be found in the care plan section)  Progress towards OT goals: Progressing toward goals  Acute Rehab OT Goals Patient Stated Goal: not stated OT Goal Formulation: With patient/family Time For Goal Achievement: 12/15/13 Potential to Achieve Goals: Good ADL Goals Pt Will Perform Grooming: with supervision;standing Pt Will Perform Upper Body Bathing: with set-up;sitting Pt Will Perform Lower Body Bathing: with min assist;with adaptive equipment Pt Will Perform Upper Body Dressing: with set-up;sitting Pt Will Perform Lower Body Dressing: with min assist;with adaptive equipment;sit to/from stand Pt Will Transfer to Toilet: with supervision;ambulating;bedside commode Pt Will Perform Toileting - Clothing  Manipulation and hygiene: with supervision;sit to/from stand Additional ADL Goal #1: Pt will perform bed mobility at supervision level with use of bed rail as needed as precursor for EOB ADLs.  Plan Discharge plan remains appropriate    Co-evaluation                 End of Session Equipment Utilized During Treatment: Gait belt;Rolling walker;Back brace   Activity Tolerance Patient tolerated treatment well   Patient Left in chair;with call bell/phone within reach;with chair alarm set;with family/visitor present   Nurse Communication          Time: [id number] OT Time Calculation (min): 34 min  Charges: OT General Charges $OT Visit: 1 Procedure OT Treatments $Self Care/Home Management : 23-37 mins  Earlie RavelingStraub, Nell Gales L OTR/L [phone] 12/11/2013, 12:01 PM

## 2013-12-11 NOTE — Discharge Instructions (Signed)

## 2013-12-11 NOTE — Discharge Summary (Signed)
Physician Discharge Summary  Patient ID: Mark Joseph MRN: [id number] DOB/AGE: 78/07/1921 78 y.o.  Admit date: 11/30/2013 Discharge date: 12/11/2013  Admission Diagnoses:L2, compression fracture, hardware failure  Discharge Diagnoses: same Active Problems:   Compression fracture of L2 lumbar vertebra   Discharged Condition: good  Hospital Course: Mark Joseph was admitted to supplement the anterior instrumentation placed for an L2 corpectomy. The corpectomy was performed to decompress the spinal canal.LUMBAR TWO CORPECTOMY,LUMBAR ONE-LUMBAR THREE ARTHRODESIS., With a titanium interbody implant (Globus) filled with morselized autograft, allograft via an anterolateral retroperitoneal approach Spinal canal decompression L2 Anterior instrumentation L1-L3, Globus plate and screws Partial resection 12th rib  A titanium globus cage was placed along with a lateral plate/screws(globus) on 10/12/2013. On his first postoperative visit xray showed the cage telescoping into the L3 vertebral body. CT showed continued canal compression also. I took him back to the operating room and placed pedicle screws at L1,and L3, and a posterior decompression of the spinal canal. Post op he has dealt with pain, and slow progression. However he has shown steady improvement during this time. He is tolerating a regular diet, ambulating with assistance, and remains with a foley catheter. He was seen by the urologist, Dr. Retta Dionesahlstedt who would like to see him in two months. Wound is clean, dry, without signs of infection. Moving lower extremities well.   Treatments: surgery: :L2 canal and bilateral L2 root decompression Posterolateral arthrodesis L1-3, morselized allograft, autograft Lumbar One through Lumbar Three posterior lateral instrumentation , globus pedicle screws, augmented with methylmethacrylate Spire plate placement L1-L3   Discharge Exam: Blood pressure 113/69, pulse 89, temperature 97.3 F (36.3 C), temperature  source Axillary, resp. rate 18, height 5\' 7"  (1.702 m), weight 67.087 kg (147 lb 14.4 oz), SpO2 100.00%. General appearance: alert, cooperative, appears stated age and mild distress  Disposition: 03-Skilled Nursing Facility lumbar fracture    Medication List         acetaminophen 325 MG tablet  Commonly known as:  TYLENOL  Take 650 mg by mouth every 6 (six) hours as needed for moderate pain.     alendronate 70 MG tablet  Commonly known as:  FOSAMAX  Take 70 mg by mouth once a week. Sunday.  Take with a full glass of water on an empty stomach.     amiodarone 200 MG tablet  Commonly known as:  PACERONE  Take 100-200 mg by mouth daily. *takes 200mg  daily Monday through Friday, but takes 100mg  daily Saturday and Sunday*     CALCIUM CITRATE PLUS Tabs  Take 1 tablet by mouth 2 (two) times daily.     cyclobenzaprine 10 MG tablet  Commonly known as:  FLEXERIL  Take 1 tablet (10 mg total) by mouth 3 (three) times daily as needed for muscle spasms.     ELIQUIS 5 MG Tabs tablet  Generic drug:  apixaban  Take 5 mg by mouth 2 (two) times daily.     finasteride 5 MG tablet  Commonly known as:  PROSCAR  Take 5 mg by mouth daily.     HYDROcodone-acetaminophen 5-325 MG per tablet  Commonly known as:  NORCO/VICODIN  Take 1-2 tablets by mouth every 6 (six) hours as needed (mild pain).     ipratropium 0.06 % nasal spray  Commonly known as:  ATROVENT  Place 2 sprays into both nostrils 3 (three) times daily as needed for rhinitis.     lisinopril-hydrochlorothiazide 10-12.5 MG per tablet  Commonly known as:  PRINZIDE,ZESTORETIC  Take 1 tablet  by mouth daily.     metoprolol tartrate 25 MG tablet  Commonly known as:  LOPRESSOR  Take 12.5 mg by mouth 2 (two) times daily.     pantoprazole 40 MG tablet  Commonly known as:  PROTONIX  Take 40 mg by mouth daily.     polyethylene glycol packet  Commonly known as:  MIRALAX / GLYCOLAX  Take 17 g by mouth daily as needed for mild  constipation.     pravastatin 40 MG tablet  Commonly known as:  PRAVACHOL  Take 40 mg by mouth daily.     tamsulosin 0.4 MG Caps capsule  Commonly known as:  FLOMAX  Take 0.4 mg by mouth daily after breakfast.           Follow-up Information   Follow up with Shenice Dolder L, MD In 3 weeks. (call office to make an appointment)    Specialty:  Neurosurgery   Contact information:   [address] [place] [address] [phone]       Signed: Audrianna Driskill L 12/11/2013, 11:19 AM

## 2013-12-11 NOTE — Progress Notes (Signed)
Patient ID: Mark Joseph, male   DOB: 11/29/1921, 80 y.o.   MRN: [id number] Lumbar films reviewed. Hardware looks quite good. No change since or.

## 2013-12-11 NOTE — Clinical Social Work Note (Signed)
CSW has faxed discharge summary to [facility]. Discharge packet complete and placed on pt's shadow chart. [facility] [phone]). Pt's wife updated at bedside regarding discharge. [facility] (EMS) arranged for transportation.  RN to call report to [facility] at [phone]  Marcelline DeistEmily Willis Holquin, MSW, [facility] Licensed Clinical Social Worker [phone] and [phone] [phone]

## 2013-12-12 ENCOUNTER — Non-Acute Institutional Stay (SKILLED_NURSING_FACILITY): Payer: Medicare Other | Admitting: Internal Medicine

## 2013-12-12 DIAGNOSIS — S32020A Wedge compression fracture of second lumbar vertebra, initial encounter for closed fracture: Secondary | ICD-10-CM

## 2013-12-12 DIAGNOSIS — I482 Chronic atrial fibrillation, unspecified: Secondary | ICD-10-CM | POA: Insufficient documentation

## 2013-12-12 DIAGNOSIS — I10 Essential (primary) hypertension: Secondary | ICD-10-CM

## 2013-12-12 DIAGNOSIS — S32009A Unspecified fracture of unspecified lumbar vertebra, initial encounter for closed fracture: Secondary | ICD-10-CM

## 2013-12-12 DIAGNOSIS — I4891 Unspecified atrial fibrillation: Secondary | ICD-10-CM

## 2013-12-12 DIAGNOSIS — N400 Enlarged prostate without lower urinary tract symptoms: Secondary | ICD-10-CM

## 2013-12-12 NOTE — Progress Notes (Signed)
HISTORY & PHYSICAL  DATE: 12/12/2013   FACILITY: [facility]  LEVEL OF CARE: SNF (31)  ALLERGIES:  No Known Allergies  CHIEF COMPLAINT:  Manage lumbar compression fracture, BPH and atrial fibrillation  HISTORY OF PRESENT ILLNESS: 80 year old Caucasian male is admitted to this facility for short-term rehabilitation often his hospitalization for surgery for L2 compression fracture.  L2 COMPRESSION FX: Patient was having hardware failure on prior anterior instrumentation. He underwent posterior lateral arthrodesis and L1-3, L1 to 3 posterior lateral instrumentation, globus pedicle screws and Spire plate placement. He tolerated the procedure well. Patient denies back pain currently. He is tolerating his pain medications without any side effects.  ATRIAL FIBRILLATION: the patients atrial fibrillation remains stable.  The patient denies DOE, tachycardia, orthopnea, transient neurological sx, pedal edema, palpitations, & PNDs.  No complications noted from the medications currently being used.  BPH: The patient's BPH remains stable. Patient denies urinary hesitancy or dribbling. No complications reported from the current medications being used.  PAST MEDICAL HISTORY :  Past Medical History  Diagnosis Date  . Hypertension   . GERD (gastroesophageal reflux disease)   . Osteoporosis   . High cholesterol   . Stroke 01/2012    "small; affected his speech; pretty much back to normal now" (09/15/2012)  . Pneumonitis 1971  . Arthritis     "knees; right is worse" (09/15/2012)  . Dysphagia     had barium swallow done 11/29/13. Being followed by Dr. Emeline Darling  . Atrial fibrillation     controlled by metoprolol and amiodarone    PAST SURGICAL HISTORY: Past Surgical History  Procedure Laterality Date  . Appendectomy  1953  . Inguinal hernia repair Bilateral 09/15/2012    w/mesh Hattie Perch 09/15/2012  . Inguinal hernia repair Bilateral 09/15/2012    Procedure: HERNIA REPAIR  INGUINAL ADULT BILATERAL;  Surgeon: Emelia Loron, MD;  Location[facility] OR;  Service: General;  Laterality: Bilateral;  . Insertion of mesh Bilateral 09/15/2012    Procedure: INSERTION OF MESH;  Surgeon: Emelia Loron, MD;  Location: [facility] OR;  Service: General;  Laterality: Bilateral;  . Anterior lat lumbar fusion Left 10/12/2013    Procedure: LUMBAR TWO CORPECTOMY,LUMBAR ONE-LUMBAR THREE ARTHRODESIS.;  Surgeon: Carmela Hurt, MD;  Location: [facility];  Service: Neurosurgery;  Laterality: Left;    SOCIAL HISTORY:  reports that he quit smoking about 45 years ago. His smoking use included Cigarettes. He has a 12.5 pack-year smoking history. He has never used smokeless tobacco. He reports that he does not drink alcohol or use illicit drugs.  FAMILY HISTORY: None  CURRENT MEDICATIONS: Reviewed per MAR/see medication list  REVIEW OF SYSTEMS:  See HPI otherwise 14 point ROS is negative.  PHYSICAL EXAMINATION  VS:  See VS section  GENERAL: no acute distress, normal body habitus EYES: conjunctivae normal, sclerae normal, normal eye lids MOUTH/THROAT: lips without lesions,no lesions in the mouth,tongue is without lesions,uvula elevates in midline NECK: supple, trachea midline, no neck masses, no thyroid tenderness, no thyromegaly LYMPHATICS: no LAN in the neck, no supraclavicular LAN RESPIRATORY: breathing is even & unlabored, BS CTAB CARDIAC: Heart rate is irregularly irregular, no murmur,no extra heart sounds, no edema GI:  ABDOMEN: abdomen soft, normal BS, no masses, no tenderness  LIVER/SPLEEN: no hepatomegaly, no splenomegaly MUSCULOSKELETAL: HEAD: normal to inspection  EXTREMITIES: LEFT UPPER EXTREMITY: full range of motion, normal strength & tone RIGHT UPPER EXTREMITY:  full range of motion, normal strength & tone LEFT  LOWER EXTREMITY:  Moderate range of motion, normal strength & tone RIGHT LOWER EXTREMITY:  Moderate range of motion, normal strength & tone PSYCHIATRIC: the  patient is alert & oriented to person, affect & behavior appropriate  LABS/RADIOLOGY:  Labs reviewed: Basic Metabolic Panel:  Recent Labs  16/10/96 1821  10/25/13 1540 10/27/13 0622 11/30/13 0709  NA  --   < > 132* 131* 137  K  --   < > 4.5 4.3 4.1  CL  --   < > 91* 91* 98  CO2  --   < > 28 27 29   GLUCOSE  --   < > 122* 97 97  BUN  --   < > 26* 22 16  CREATININE  --   < > 1.05 1.04 1.05  CALCIUM  --   < > 9.0 9.3 9.3  MG 1.8  --   --   --   --   < > = values in this interval not displayed. Liver Function Tests:  Recent Labs  07/28/13 0500 10/06/13 2025 10/18/13 0730  AST 16 25 26   ALT 16 19 22   ALKPHOS 61 61 56  BILITOT 0.4 0.4 0.5  PROT 6.0 7.0 5.9*  ALBUMIN 3.2* 3.9 2.9*   CBC:  Recent Labs  10/06/13 2025  10/18/13 0730 10/24/13 1513 11/02/13 0545 11/30/13 0709  WBC 6.5  < > 6.5 9.1 6.1 9.4  NEUTROABS 4.2  --  4.0  --  3.9  --   HGB 12.3*  < > 8.7* 8.7* 9.7* 11.5*  HCT 35.1*  < > 25.2* 24.9* 28.0* 34.1*  MCV 92.1  < > 92.0 92.6 92.4 92.9  PLT 234  < > 241 373 311 305  < > = values in this interval not displayed.  Cardiac Enzymes:  Recent Labs  07/22/13 0454 07/22/13 1821 07/22/13 2224  TROPONINI <0.30 <0.30 <0.30    CT LUMBAR SPINE WITHOUT CONTRAST   TECHNIQUE: Multidetector CT imaging of the lumbar spine was performed without intravenous contrast administration. Multiplanar CT image reconstructions were also generated.   COMPARISON:  Plain films lumbar spine 11/20/2013 and intraoperative fluoroscopic spot views of the lumbar spine 10/12/2013. MRI lumbar spine 10/05/2013.   FINDINGS: Left lateral plate and screws are in place from L1-L3. Titanium interbody implant in L2 is identified. Compared intraoperative fluoroscopic spot views, the superior aspect of the implant demonstrates and anterior tilt of 22 degrees. Tilt appears to be due to subsidence of the inferior end of the implant into the superior endplate of L3. The implant now  nearly abuts the screws in L3 from lateral fusion hardware. Very small segment of the anterior aspect of the endplate eccentric to the left is not covered by the L1 inferior endplate. There is a large retropulsed bony fragment off the posterior aspect of L2 causing severe central canal stenosis.   Remote superior endplate compression fracture T12 is identified. Very mild superior endplate compression fracture of L1 is also seen. No other fracture is identified. Imaged intra-abdominal contents are unremarkable. Degenerative disease of the lumbar spine does not appear markedly changed compared to the prior MRI with disc bulging most prominent at L4-5.   IMPRESSION: Status post interbody implant placement at L2 for compression fracture. Subsidence of the spacer into the superior endplate of L3 results in anterior tilt of the superior aspect of the implant of 22 degrees. Retropulsed bone into the central canal posterior to L2 causes severe central canal stenosis.   Mild, remote superior  endplate compression fractures of T12 and L1.   Lumbar spondylosis most notable at L4-5 does not appear changed compared to prior MRI. CHEST  2 VIEW   COMPARISON:  07/22/2013   FINDINGS: Stable tortuous thoracic aorta. contrast medium in the splenic flexure of the colon.   Bony demineralization. Mildly enlarged cardiopericardial silhouette, without edema.   IMPRESSION: 1. No acute thoracic findings. 2. Tortuous thoracic aorta. 3. Mild bony demineralization. 4. Barium in the colon. DG C-ARM 61-120 MIN; LUMBAR SPINE - 1 VIEW   FLUOROSCOPY TIME:  52 seconds   COMPARISON:  10/12/2013   FINDINGS: Prior L2 vertebrectomy. Interval posterior fusion of the L1-L3 posterior elements . No lateral images provided.   IMPRESSION: Interval posterior fusion from L1-L3 seen only on a frontal view. No lateral view provided. Prior L2 vertebrectomy.   DG C-ARM 61-120 MIN; LUMBAR SPINE - 1 VIEW     FLUOROSCOPY TIME:  52 seconds   COMPARISON:  10/12/2013   FINDINGS: Prior L2 vertebrectomy. Interval posterior fusion of the L1-L3 posterior elements . No lateral images provided.   IMPRESSION: Interval posterior fusion from L1-L3 seen only on a frontal view. No lateral view provided. Prior L2 vertebrectomy.   LUMBAR SPINE - 1 VIEW   COMPARISON:  None.   FINDINGS: Single intraoperative fluoroscopic spot image of the lumbar spine provided. Posterior spinal fusion transfixing the L1 and L3 vertebral levels. Prior L2 vertebrectomy. No hardware failure or complication.   IMPRESSION: Interval posterior spinal fusion transfixing the L1 and L3 posterior elements. Prior L2 vertebrectomy.     LUMBAR SPINE - 2-3 VIEW   COMPARISON:  Lateral lumbar spine radiograph November 30, 2013   FINDINGS: Status post L2 interbody cage, L1 and L3 pedicle screws and posterior bone graft material. Hardware appears intact, well seated. Mild chronic T12 compression fracture. No acute fracture deformity. No malalignment. Severe L5-S1 disc degeneration, unchanged. Bone mineral density is decreased without destructive bony lesions. Small calcifications and right abdomen likely reflect gallstones. Rounded densities in left pelvis likely reflect diverticulosis. Sacroiliac joints are symmetric. Mild aortoiliac vascular calcifications.   IMPRESSION: Stable L1 through L3 posterior instrumentation with L2 corpectomy cage. Stable mild T12 compression fracture without acute fracture deformity or malalignment.   Osteopenia, which decreases sensitivity for acute nondisplaced fracture.   Suspected cholelithiasis and diverticulosis.    ASSESSMENT/PLAN:  L2 compression fracture-status post surgery. Continue rehabilitation. Atrial fibrillation-rate controlled BPH-continue Proscar and Flomax Hypertension-blood pressure borderline. Monitor. GERD-continue Protonix Hyperlipidemia-continue Pravachol  I have  reviewed patient's medical records received at admission/from hospitalization.  CPT CODE: [id number]  Angela CoxGayani Y Kemo Spruce, MD [facility] [phone]

## 2013-12-13 ENCOUNTER — Encounter (HOSPITAL_COMMUNITY): Payer: Self-pay | Admitting: Neurosurgery

## 2013-12-22 ENCOUNTER — Encounter: Payer: Self-pay | Admitting: Adult Health

## 2013-12-22 ENCOUNTER — Non-Acute Institutional Stay (SKILLED_NURSING_FACILITY): Payer: Medicare Other | Admitting: Adult Health

## 2013-12-22 DIAGNOSIS — S32009A Unspecified fracture of unspecified lumbar vertebra, initial encounter for closed fracture: Secondary | ICD-10-CM

## 2013-12-22 DIAGNOSIS — I4891 Unspecified atrial fibrillation: Secondary | ICD-10-CM

## 2013-12-22 DIAGNOSIS — I482 Chronic atrial fibrillation, unspecified: Secondary | ICD-10-CM

## 2013-12-22 DIAGNOSIS — S32020A Wedge compression fracture of second lumbar vertebra, initial encounter for closed fracture: Secondary | ICD-10-CM

## 2013-12-22 DIAGNOSIS — E785 Hyperlipidemia, unspecified: Secondary | ICD-10-CM

## 2013-12-22 DIAGNOSIS — I10 Essential (primary) hypertension: Secondary | ICD-10-CM

## 2013-12-22 DIAGNOSIS — N400 Enlarged prostate without lower urinary tract symptoms: Secondary | ICD-10-CM

## 2013-12-22 DIAGNOSIS — K219 Gastro-esophageal reflux disease without esophagitis: Secondary | ICD-10-CM

## 2013-12-22 NOTE — Progress Notes (Signed)
Patient ID: Mark Joseph, male   DOB: 08/26/1921, 80 y.o.   MRN: [id number]              PROGRESS NOTE  DATE: 12/22/2013   FACILITY: [facility]  LEVEL OF CARE: SNF (31)  Acute Visit  CHIEF COMPLAINT:  Discharge Notes  HISTORY OF PRESENT ILLNESS:  This is an 80 year old male who is for discharge home with Home health PT, OT, Nursing and Home health aide. [facility] with Compression fracture of L2 and hardware failure S/P L2 corpectomy and L1-L3 arthrodesis. Patient was admitted to this facility for short-term rehabilitation after the patient's recent hospitalization.  Patient has completed SNF rehabilitation and therapy has cleared the patient for discharge.   Reassessment of ongoing problem(s):  ATRIAL FIBRILLATION: the patients atrial fibrillation remains stable.  The patient denies DOE, tachycardia, orthopnea, transient neurological sx, pedal edema, palpitations, & PNDs.  No complications noted from the medications currently being used.  HTN: Pt 's HTN remains stable.  Denies CP, sob, DOE, pedal edema, headaches, dizziness or visual disturbances.  No complications from the medications currently being used.  Last BP : 112/75  GERD: pt's GERD is stable.  Denies ongoing heartburn, abd. Pain, nausea or vomiting.  Currently on a PPI & tolerates it without any adverse reactions.   PAST MEDICAL HISTORY : Reviewed.  No changes/see problem list  CURRENT MEDICATIONS: Reviewed per MAR/see medication list  REVIEW OF SYSTEMS:  GENERAL: no change in appetite, no fatigue, no weight changes, no fever, chills or weakness RESPIRATORY: no cough, SOB, DOE, wheezing, hemoptysis CARDIAC: no chest pain, edema or palpitations GI: no abdominal pain, diarrhea, constipation, heart burn, nausea or vomiting  PHYSICAL EXAMINATION  GENERAL: no acute distress, normal body habitus SKIN:  Midline lower back incision is healed, dry,  no redness EYES: conjunctivae normal, sclerae normal, normal eye lids NECK: supple, trachea midline, no neck masses, no thyroid tenderness, no thyromegaly LYMPHATICS: no LAN in the neck, no supraclavicular LAN RESPIRATORY: breathing is even & unlabored, BS CTAB CARDIAC: irregularly irregular, no murmur,no extra heart sounds, no edema GI: abdomen soft, normal BS, no masses, no tenderness, no hepatomegaly, no splenomegaly EXTREMITIES:  Able to move all 4 extremities; +lumbar brace  PSYCHIATRIC: the patient is alert & oriented to person, affect & behavior appropriate  LABS/RADIOLOGY: Labs reviewed: Basic Metabolic Panel:  Recent Labs  40/98/1103/14/15 1821  10/25/13 1540 10/27/13 0622 11/30/13 0709  NA  --   < > 132* 131* 137  K  --   < > 4.5 4.3 4.1  CL  --   < > 91* 91* 98  CO2  --   < > 28 27 29   GLUCOSE  --   < > 122* 97 97  BUN  --   < > 26* 22 16  CREATININE  --   < > 1.05 1.04 1.05  CALCIUM  --   < > 9.0 9.3 9.3  MG 1.8  --   --   --   --   < > = values in this interval not displayed. Liver Function Tests:  Recent Labs  07/28/13 0500 10/06/13 2025 10/18/13 0730  AST 16 25 26   ALT 16 19 22   ALKPHOS 61 61 56  BILITOT 0.4 0.4 0.5  PROT 6.0 7.0 5.9*  ALBUMIN 3.2* 3.9 2.9*   CBC:  Recent Labs  10/06/13 2025  10/18/13 0730 10/24/13 1513  11/02/13 0545 11/30/13 0709  WBC 6.5  < > 6.5 9.1 6.1 9.4  NEUTROABS 4.2  --  4.0  --  3.9  --   HGB 12.3*  < > 8.7* 8.7* 9.7* 11.5*  HCT 35.1*  < > 25.2* 24.9* 28.0* 34.1*  MCV 92.1  < > 92.0 92.6 92.4 92.9  PLT 234  < > 241 373 311 305  < > = values in this interval not displayed.  Cardiac Enzymes:  Recent Labs  07/22/13 0923 07/22/13 1821 07/22/13 2224  TROPONINI <0.30 <0.30 <0.30    EXAM: LUMBAR SPINE - 1 VIEW   COMPARISON:  None.   FINDINGS: Single intraoperative fluoroscopic spot image of the lumbar spine provided. Posterior spinal fusion transfixing the L1 and L3 vertebral levels. Prior L2 vertebrectomy. No  hardware failure or complication.   IMPRESSION: Interval posterior spinal fusion transfixing the L1 and L3 posterior elements. Prior L2 vertebrectomy. EXAM: LUMBAR SPINE - 2-3 VIEW   COMPARISON:  Lateral lumbar spine radiograph November 30, 2013   FINDINGS: Status post L2 interbody cage, L1 and L3 pedicle screws and posterior bone graft material. Hardware appears intact, well seated. Mild chronic T12 compression fracture. No acute fracture deformity. No malalignment. Severe L5-S1 disc degeneration, unchanged. Bone mineral density is decreased without destructive bony lesions. Small calcifications and right abdomen likely reflect gallstones. Rounded densities in left pelvis likely reflect diverticulosis. Sacroiliac joints are symmetric. Mild aortoiliac vascular calcifications.   IMPRESSION: Stable L1 through L3 posterior instrumentation with L2 corpectomy cage. Stable mild T12 compression fracture without acute fracture deformity or malalignment.   Osteopenia, which decreases sensitivity for acute nondisplaced fracture.   Suspected cholelithiasis and diverticulosis.   ASSESSMENT/PLAN:  Compression fracture of L2 S/P L2 corpectomy and L1-L3 arthrodesis - for Home health PT, OT, Nursing and Home health Aide Chronic atrial fibrillation - rate controlled; continue amiodarone and Eliquis BPH - will be discharged with foley catheter and follow-up with urologist; continue Proscar and Flomax Hypertension - well controlled; continue lisinopril and Lopressor GERD - stable; continue protonix Hyperlipidemia - continue Lipitor   I have filled out patient's discharge paperwork and written prescriptions.  Patient will receive home health PT, OT, Nursing and home health aide.  Total discharge time: Less than 30 minutes  Discharge time involved coordination of the discharge process with Child psychotherapist, nursing staff and therapy department. Medical justification for home health services  verified.  CPT CODE: 16109  Ella Bodo - NP [facility] [phone]

## 2014-01-29 ENCOUNTER — Ambulatory Visit: Payer: Medicare Other | Admitting: Podiatry

## 2014-02-21 ENCOUNTER — Other Ambulatory Visit: Payer: Self-pay | Admitting: *Deleted

## 2014-02-21 MED ORDER — METOPROLOL TARTRATE 25 MG PO TABS: 12.5000 mg | ORAL_TABLET | Freq: Two times a day (BID) | ORAL | Status: DC

## 2014-03-08 ENCOUNTER — Ambulatory Visit (INDEPENDENT_AMBULATORY_CARE_PROVIDER_SITE_OTHER): Payer: Medicare Other | Admitting: Internal Medicine

## 2014-03-08 ENCOUNTER — Encounter: Payer: Self-pay | Admitting: Internal Medicine

## 2014-03-08 VITALS — BP 130/64 | HR 56 | Ht 67.0 in | Wt 149.0 lb

## 2014-03-08 DIAGNOSIS — I482 Chronic atrial fibrillation, unspecified: Secondary | ICD-10-CM

## 2014-03-08 DIAGNOSIS — I4891 Unspecified atrial fibrillation: Secondary | ICD-10-CM

## 2014-03-08 DIAGNOSIS — I10 Essential (primary) hypertension: Secondary | ICD-10-CM

## 2014-03-08 NOTE — Progress Notes (Signed)
HPI  [name] returns today for followup. He is a pleasant 80 yo man with a h/o atrial tachycardia, s/p initiation of amiodarone. He has sustained a spinal fracture, requiring 2 different surgeries. He is still weak. He denies chest pain or sob. He has tolerated the amiodarone well. No symptomatic bradycardia. He has not had symptomatic atrial fibrillation or atrial tachycardia. His physical therapy is progressing slowly. He remains on a walker. No Known Allergies   Current Outpatient Prescriptions  Medication Sig Dispense Refill  . acetaminophen (TYLENOL) 325 MG tablet Take 650 mg by mouth every 6 (six) hours as needed for moderate pain.      Marland Kitchen. alendronate (FOSAMAX) 70 MG tablet Take 70 mg by mouth once a week. Sunday.  Take with a full glass of water on an empty stomach.      Marland Kitchen. amiodarone (PACERONE) 200 MG tablet Take 100-200 mg by mouth daily. *takes 200mg  daily Monday through Friday, but takes 100mg  daily Saturday and Sunday*      . apixaban (ELIQUIS) 5 MG TABS tablet Take 5 mg by mouth 2 (two) times daily.      . finasteride (PROSCAR) 5 MG tablet Take 5 mg by mouth daily.      Marland Kitchen. ipratropium (ATROVENT) 0.06 % nasal spray Place 2 sprays into both nostrils 3 (three) times daily as needed for rhinitis.      Marland Kitchen. lisinopril-hydrochlorothiazide (PRINZIDE,ZESTORETIC) 10-12.5 MG per tablet Take 1 tablet by mouth daily.      . metoprolol tartrate (LOPRESSOR) 25 MG tablet Take 0.5 tablets (12.5 mg total) by mouth 2 (two) times daily.  90 tablet  1  . Multiple Minerals-Vitamins (CALCIUM CITRATE PLUS) TABS Take 1 tablet by mouth 2 (two) times daily.      . pantoprazole (PROTONIX) 40 MG tablet Take 40 mg by mouth daily.      . polyethylene glycol (MIRALAX / GLYCOLAX) packet Take 17 g by mouth daily as needed for mild constipation.       . pravastatin (PRAVACHOL) 40 MG tablet Take 40 mg by mouth daily.      . tamsulosin (FLOMAX) 0.4 MG CAPS capsule Take 0.4 mg by mouth daily after breakfast.        No current facility-administered medications for this visit.     Past Medical History  Diagnosis Date  . Hypertension   . GERD (gastroesophageal reflux disease)   . Osteoporosis   . High cholesterol   . Stroke 01/2012    "small; affected his speech; pretty much back to normal now" (09/15/2012)  . Pneumonitis 1971  . Arthritis     "knees; right is worse" (09/15/2012)  . Dysphagia     had barium swallow done 11/29/13. Being followed by Dr. Emeline DarlingGore  . Atrial fibrillation     controlled by metoprolol and amiodarone    ROS:   All systems reviewed and negative except as noted in the HPI.   Past Surgical History  Procedure Laterality Date  . Appendectomy  1953  . Inguinal hernia repair Bilateral 09/15/2012    w/mesh Hattie Perch/notes 09/15/2012  . Inguinal hernia repair Bilateral 09/15/2012    Procedure: HERNIA REPAIR INGUINAL ADULT BILATERAL;  Surgeon: Emelia LoronMatthew Wakefield, MD;  [facility];  Service: General;  Laterality: Bilateral;  . Insertion of mesh Bilateral 09/15/2012    Procedure: INSERTION OF MESH;  Surgeon: Emelia LoronMatthew Wakefield, MD;  [facility];  Service: General;  Laterality: Bilateral;  . Anterior lat lumbar fusion Left 10/12/2013  Procedure: LUMBAR TWO CORPECTOMY,LUMBAR ONE-LUMBAR THREE ARTHRODESIS.;  Surgeon: Carmela HurtKyle L Cabbell, MD;  Location: [facility];  Service: Neurosurgery;  Laterality: Left;     History reviewed. No pertinent family history.   History   Social History  . Marital Status: Married    Spouse Name: N/A    Number of Children: N/A  . Years of Education: N/A   Occupational History  . Not on file.   Social History Main Topics  . Smoking status: Former Smoker -- 0.50 packs/day for 25 years    Types: Cigarettes    Quit date: 05/11/1968  . Smokeless tobacco: Never Used  . Alcohol Use: No  . Drug Use: No  . Sexual Activity: Not on file   Other Topics Concern  . Not on file   Social History Narrative  . No narrative on file     BP 130/64  Pulse 56   Ht 5\' 7"  (1.702 m)  Wt 149 lb (67.586 kg)  BMI 23.33 kg/m2  Physical Exam:  Chronically ill appearing 80 yo man, NAD HEENT: Unremarkable Neck:  No JVD, no thyromegally Back:  No CVA tenderness Lungs:  Clear with no wheezes HEART:  Regular rate rhythm, no murmurs, no rubs, no clicks Abd:  soft, positive bowel sounds, no organomegally, no rebound, no guarding Ext:  2 plus pulses, no edema, no cyanosis, no clubbing Skin:  No rashes no nodules Neuro:  CN II through XII intact, motor grossly intact  EKG - nsr  Assess/Plan:

## 2014-03-08 NOTE — Assessment & Plan Note (Signed)
He appears to be maintaining sinus rhythm very nicely on low-dose amiodarone and oral anticoagulation. He'll continue his current medical therapy. The patient does describe some falls, and I've notified him that if his falls continue, we will have to stop his Eliquis.

## 2014-03-08 NOTE — Patient Instructions (Signed)
Your physician wants you to follow-up in: 6 months with Dr Taylor You will receive a reminder letter in the mail two months in advance. If you don't receive a letter, please call our office to schedule the follow-up appointment.  

## 2014-03-08 NOTE — Assessment & Plan Note (Signed)
His blood pressure has been fairly well-controlled. No change in medical therapy.

## 2014-03-23 ENCOUNTER — Telehealth: Payer: Self-pay | Admitting: Internal Medicine

## 2014-03-23 NOTE — Telephone Encounter (Signed)
Prior to PT session had a SOB spell.  His BP was 130/88 HR 56.  After resting 124/76 HR 54.  These spells have happened a few times in the last few weeks.

## 2014-03-23 NOTE — Telephone Encounter (Signed)
New message     Pt was walking earlier today while at physical therapy and became sob.  He sat down and rested.  Now he is ok.  Pt told wife and physical therapist that he has had sob episodes a couple of times before.  Wife want to talk to a nurse.

## 2014-03-27 NOTE — Telephone Encounter (Signed)
Dr. Ladona Ridgelaylor reviewed PT report and signed saying okay for him to resume PT and to exercise.  His VS are stable

## 2014-04-12 ENCOUNTER — Other Ambulatory Visit: Payer: Self-pay | Admitting: Internal Medicine

## 2014-09-06 ENCOUNTER — Other Ambulatory Visit: Payer: Self-pay

## 2014-09-06 MED ORDER — APIXABAN 5 MG PO TABS: ORAL_TABLET | ORAL | Status: DC

## 2014-09-18 ENCOUNTER — Telehealth: Payer: Self-pay | Admitting: Internal Medicine

## 2014-09-18 NOTE — Telephone Encounter (Signed)
Spoke with Mark Joseph and his HR was low  He has been complaining of fatigue the last 2 days but no dizziness.  Had some SOB with exercise but otjerwice been okay.  They will keep his follow up for next week and call if his symptoms worsen

## 2014-09-18 NOTE — Telephone Encounter (Signed)
Pt's nurse form [employer] checked BP and heart rate was 49, was told to call cardiologist--per wife denies any symptoms--yesterday felt tired--appt with Ladona Ridgelaylor 09-26-14--pls advise [phone] or [phone]

## 2014-09-26 ENCOUNTER — Encounter: Payer: Self-pay | Admitting: Internal Medicine

## 2014-09-26 ENCOUNTER — Ambulatory Visit (INDEPENDENT_AMBULATORY_CARE_PROVIDER_SITE_OTHER): Payer: Medicare Other | Admitting: Internal Medicine

## 2014-09-26 VITALS — BP 158/82 | HR 49 | Ht 69.0 in | Wt 155.0 lb

## 2014-09-26 DIAGNOSIS — I482 Chronic atrial fibrillation, unspecified: Secondary | ICD-10-CM

## 2014-09-26 DIAGNOSIS — I4891 Unspecified atrial fibrillation: Secondary | ICD-10-CM | POA: Diagnosis not present

## 2014-09-26 DIAGNOSIS — I6389 Other cerebral infarction: Secondary | ICD-10-CM

## 2014-09-26 DIAGNOSIS — Z79899 Other long term (current) drug therapy: Secondary | ICD-10-CM | POA: Diagnosis not present

## 2014-09-26 DIAGNOSIS — I10 Essential (primary) hypertension: Secondary | ICD-10-CM

## 2014-09-26 DIAGNOSIS — I638 Other cerebral infarction: Secondary | ICD-10-CM

## 2014-09-26 DIAGNOSIS — I495 Sick sinus syndrome: Secondary | ICD-10-CM

## 2014-09-26 LAB — HEPATIC FUNCTION PANEL
ALK PHOS: 43 U/L (ref 39–117)
ALT: 20 U/L (ref 0–53)
AST: 26 U/L (ref 0–37)
Albumin: 4.4 g/dL (ref 3.5–5.2)
BILIRUBIN DIRECT: 0.1 mg/dL (ref 0.0–0.3)
Total Bilirubin: 0.6 mg/dL (ref 0.2–1.2)
Total Protein: 7.2 g/dL (ref 6.0–8.3)

## 2014-09-26 LAB — TSH: TSH: 8.58 u[IU]/mL — ABNORMAL HIGH (ref 0.35–4.50)

## 2014-09-26 NOTE — Assessment & Plan Note (Signed)
He is maintaining NSR Very nicely on amio. He will continue his current meds including systemic anti-coagulation.

## 2014-09-26 NOTE — Progress Notes (Signed)
HPI  Mark Joseph returns today for followup. He is a pleasant 81 yo man with a h/o atrial tachycardia, s/p initiation of amiodarone. He has sustained a spinal fracture, requiring 2 different surgeries. He is still weak but stable. He denies chest pain or sob. He has tolerated the amiodarone well. No symptomatic bradycardia. He has not had symptomatic atrial fibrillation or atrial tachycardia.  No Known Allergies   Current Outpatient Prescriptions  Medication Sig Dispense Refill  . acetaminophen (TYLENOL) 325 MG tablet Take 650 mg by mouth every 6 (six) hours as needed for moderate pain.    Marland Kitchen. alendronate (FOSAMAX) 70 MG tablet Take 70 mg by mouth once a week. Sunday.  Take with a full glass of water on an empty stomach.    Marland Kitchen. amiodarone (PACERONE) 200 MG tablet Take 100-200 mg by mouth daily. *takes 200mg  daily Monday through Friday, but takes 100mg  daily Saturday and Sunday*    . apixaban (ELIQUIS) 5 MG TABS tablet TAKE 1 BY MOUTH TWICE DAILY 180 tablet 0  . finasteride (PROSCAR) 5 MG tablet Take 5 mg by mouth daily.    Marland Kitchen. ipratropium (ATROVENT) 0.06 % nasal spray Place 2 sprays into both nostrils 3 (three) times daily as needed for rhinitis.    Marland Kitchen. lisinopril-hydrochlorothiazide (PRINZIDE,ZESTORETIC) 10-12.5 MG per tablet Take 1 tablet by mouth daily.    . metoprolol tartrate (LOPRESSOR) 25 MG tablet Take 0.5 tablets (12.5 mg total) by mouth 2 (two) times daily. 90 tablet 1  . Multiple Minerals-Vitamins (CALCIUM CITRATE PLUS) TABS Take 1 tablet by mouth 2 (two) times daily.    . pantoprazole (PROTONIX) 40 MG tablet Take 40 mg by mouth daily.    . polyethylene glycol (MIRALAX / GLYCOLAX) packet Take 17 g by mouth daily as needed for mild constipation.     . pravastatin (PRAVACHOL) 40 MG tablet Take 40 mg by mouth daily.    . tamsulosin (FLOMAX) 0.4 MG CAPS capsule Take 0.4 mg by mouth daily after breakfast.     No current facility-administered medications for this visit.     Past Medical  History  Diagnosis Date  . Hypertension   . GERD (gastroesophageal reflux disease)   . Osteoporosis   . High cholesterol   . Stroke 01/2012    "small; affected his speech; pretty much back to normal now" (09/15/2012)  . Pneumonitis 1971  . Arthritis     "knees; right is worse" (09/15/2012)  . Dysphagia     had barium swallow done 11/29/13. Being followed by Dr. Emeline DarlingGore  . Atrial fibrillation     controlled by metoprolol and amiodarone    ROS:   All systems reviewed and negative except as noted in the HPI.   Past Surgical History  Procedure Laterality Date  . Appendectomy  1953  . Inguinal hernia repair Bilateral 09/15/2012    w/mesh Hattie Perch/notes 09/15/2012  . Inguinal hernia repair Bilateral 09/15/2012    Procedure: HERNIA REPAIR INGUINAL ADULT BILATERAL;  Surgeon: Emelia LoronMatthew Wakefield, MD;  [facility];  Service: General;  Laterality: Bilateral;  . Insertion of mesh Bilateral 09/15/2012    Procedure: INSERTION OF MESH;  Surgeon: Emelia LoronMatthew Wakefield, MD;  [facility];  Service: General;  Laterality: Bilateral;  . Anterior lat lumbar fusion Left 10/12/2013    Procedure: LUMBAR TWO CORPECTOMY,LUMBAR ONE-LUMBAR THREE ARTHRODESIS.;  Surgeon: Carmela HurtKyle L Cabbell, MD;  Location: [facility];  Service: Neurosurgery;  Laterality: Left;     No family history on file.  History   Social History  . Marital Status: Married    Spouse Name: N/A  . Number of Children: N/A  . Years of Education: N/A   Occupational History  . Not on file.   Social History Main Topics  . Smoking status: Former Smoker -- 0.50 packs/day for 25 years    Types: Cigarettes    Quit date: 05/11/1968  . Smokeless tobacco: Never Used  . Alcohol Use: No  . Drug Use: No  . Sexual Activity: Not on file   Other Topics Concern  . Not on file   Social History Narrative     BP 158/82 mmHg  Pulse 49  Ht 5\' 9"  (1.753 m)  Wt 155 lb (70.308 kg)  BMI 22.88 kg/m2  Physical Exam:  Chronically ill appearing 81 yo man,  NAD HEENT: Unremarkable Neck:  No JVD, no thyromegally Back:  No CVA tenderness Lungs:  Clear with no wheezes HEART:  Regular rate rhythm, no murmurs, no rubs, no clicks Abd:  soft, positive bowel sounds, no organomegally, no rebound, no guarding Ext:  2 plus pulses, no edema, no cyanosis, no clubbing Skin:  No rashes no nodules Neuro:  CN II through XII intact, motor grossly intact  EKG - nsr with RBBB and LAFB  Assess/Plan:

## 2014-09-26 NOTE — Patient Instructions (Signed)
Medication Instructions:  Your physician recommends that you continue on your current medications as directed. Please refer to the Current Medication list given to you today.  Labwork: TSH & Hepatic function panel today  Testing/Procedures: None ordered  Follow-Up: Your physician wants you to follow-up in: 6 months with Dr. Ladona Ridgelaylor. You will receive a reminder letter in the mail two months in advance. If you don't receive a letter, please call our office to schedule the follow-up appointment.   Thank you for choosing [facility]!!

## 2014-09-26 NOTE — Assessment & Plan Note (Signed)
He will continue systemic anticoagulation for thromboembolic prevention.

## 2014-09-26 NOTE — Assessment & Plan Note (Signed)
His blood pressure is elevated. He is encouraged to reduce his sodium intake.

## 2014-09-26 NOTE — Assessment & Plan Note (Signed)
Today we discussed the symptoms he might experience if he developed symptomatic sinus node dysfunction. He will continue his current medications.

## 2014-10-02 ENCOUNTER — Other Ambulatory Visit: Payer: Self-pay | Admitting: *Deleted

## 2014-10-02 MED ORDER — APIXABAN 5 MG PO TABS: ORAL_TABLET | ORAL | Status: DC

## 2014-11-05 ENCOUNTER — Other Ambulatory Visit: Payer: Self-pay | Admitting: Internal Medicine

## 2014-12-03 ENCOUNTER — Other Ambulatory Visit: Payer: Self-pay

## 2014-12-03 MED ORDER — APIXABAN 5 MG PO TABS: ORAL_TABLET | ORAL | Status: AC

## 2014-12-27 ENCOUNTER — Other Ambulatory Visit: Payer: Self-pay

## 2014-12-27 MED ORDER — METOPROLOL TARTRATE 25 MG PO TABS: 12.5000 mg | ORAL_TABLET | Freq: Two times a day (BID) | ORAL | Status: AC

## 2015-01-03 IMAGING — RF DG ESOPHAGUS
12 series · 12 of 12 positions shown · non-contrast
Comparison: None

CLINICAL DATA: Dysphasia.

EXAM:
ESOPHOGRAM/BARIUM SWALLOW
TECHNIQUE: Single contrast examination was performed using  thin barium.
FLUOROSCOPY TIME:  1 min and 53 seconds

[Series 1: run · 1 of 1 slices shown (1 of 12)]
[im 1/1]
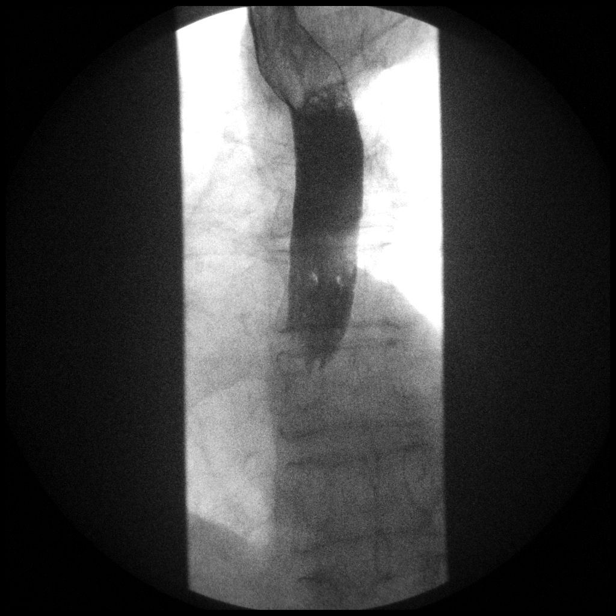

[Series 2: run · 1 of 1 slices shown (2 of 12)]
[im 1/1]
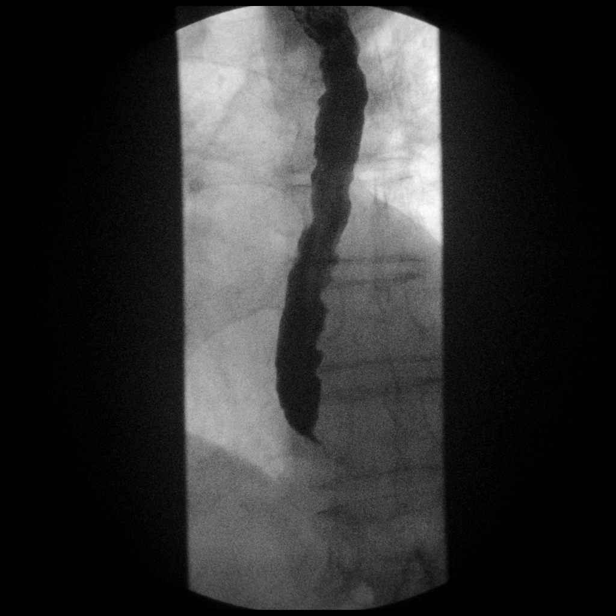

[Series 3: run · 1 of 1 slices shown (3 of 12)]
[im 1/1]
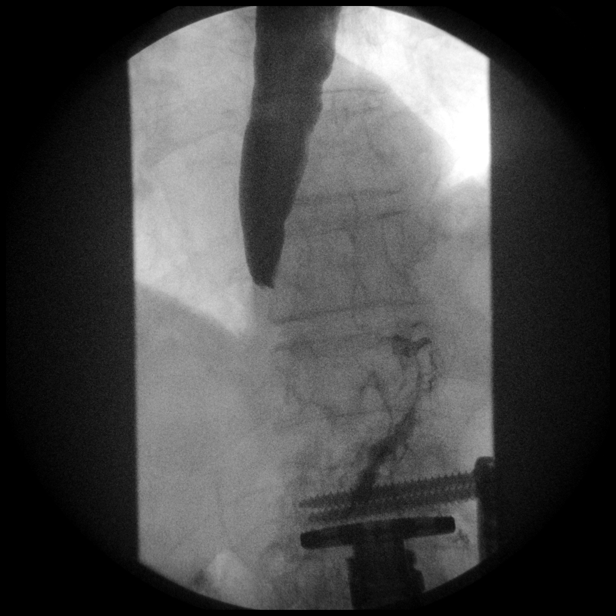

[Series 4: run · 1 of 1 slices shown (4 of 12)]
[im 1/1]
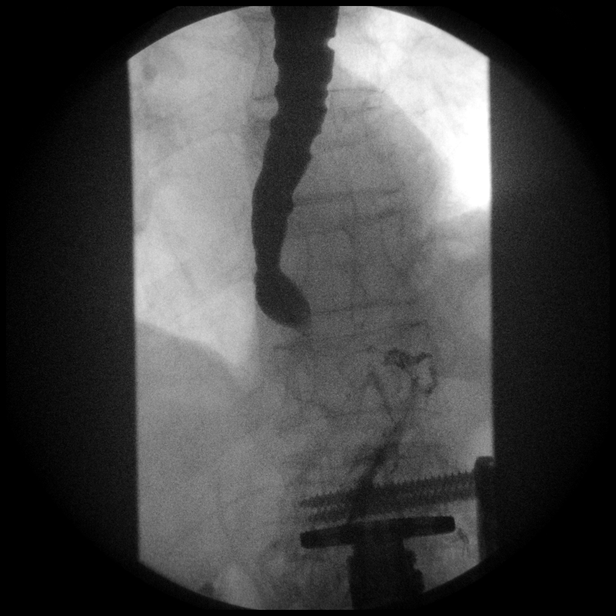

[Series 5: run · 1 of 1 slices shown (5 of 12)]
[im 1/1]
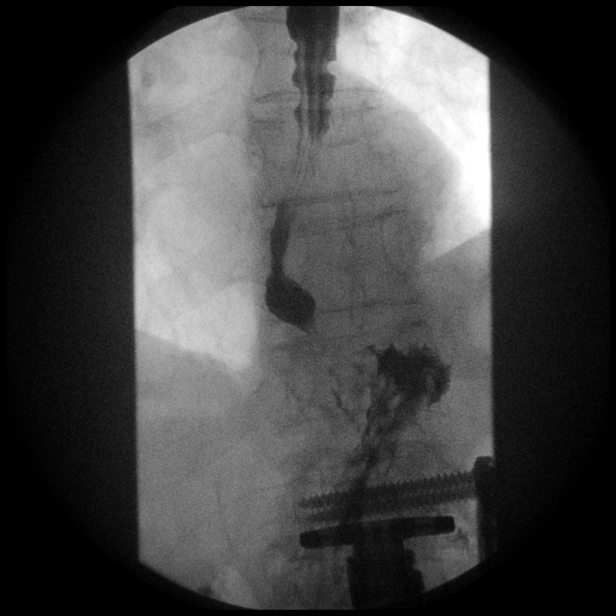

[Series 6: run · 1 of 1 slices shown (6 of 12)]
[im 1/1]
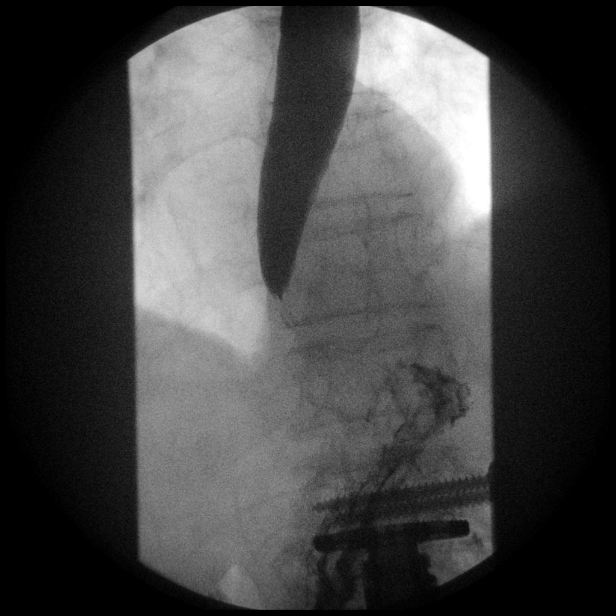

[Series 7: run · 1 of 1 slices shown (7 of 12)]
[im 1/1]
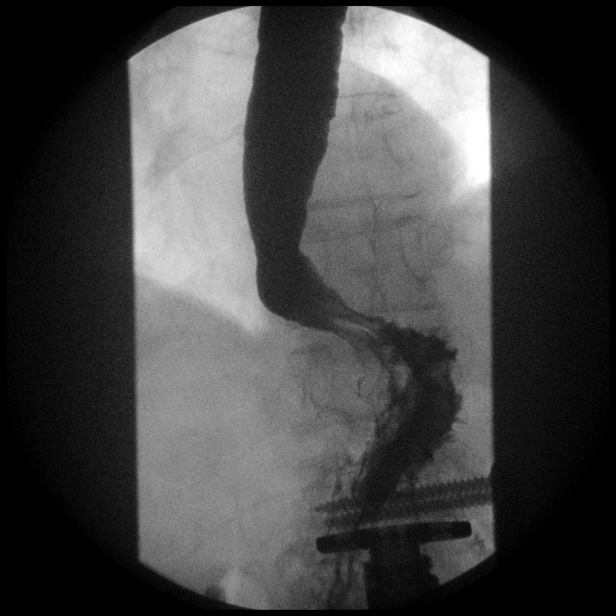

[Series 8: run · 1 of 1 slices shown (8 of 12)]
[im 1/1]
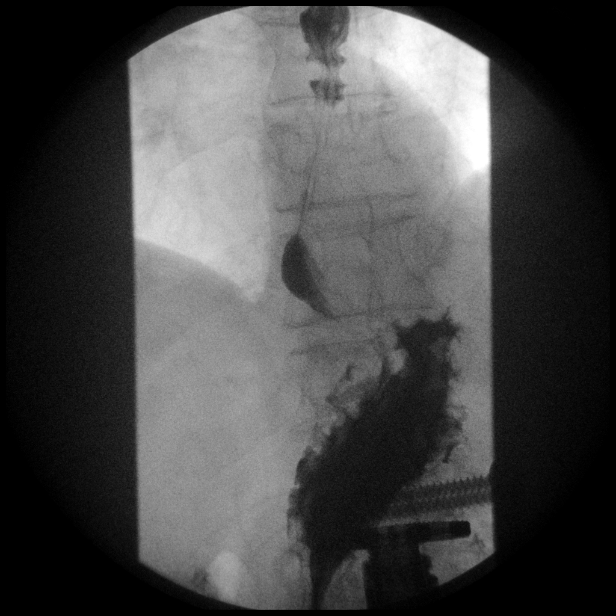

[Series 9: run · 1 of 1 slices shown (9 of 12)]
[im 1/1]
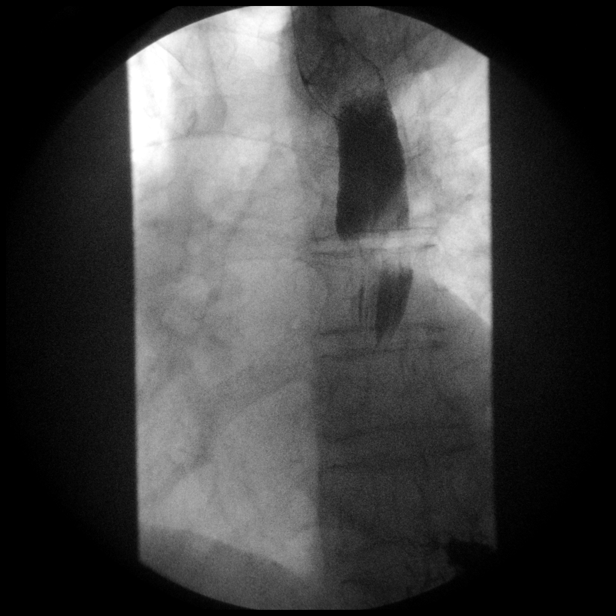

[Series 10: run · 1 of 1 slices shown (10 of 12)]
[im 1/1]
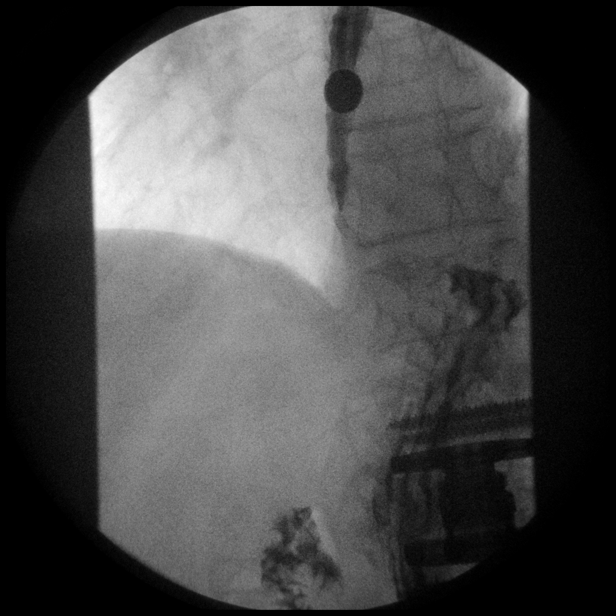

[Series 11: run · 1 of 1 slices shown (11 of 12)]
[im 1/1]
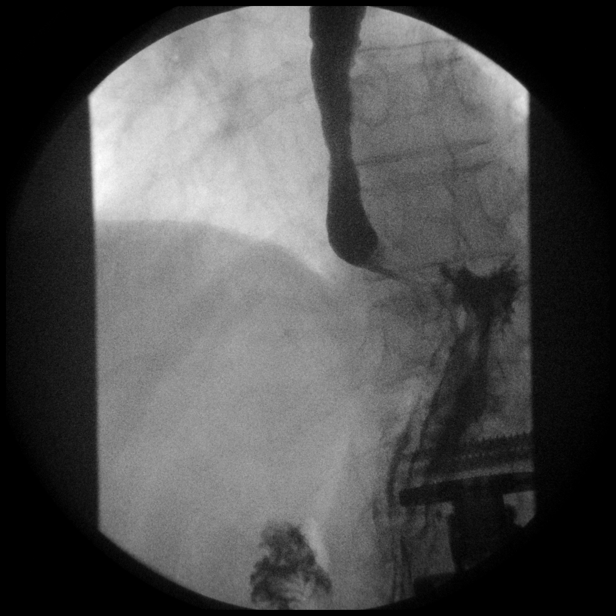

[Series 12: run · 1 of 1 slices shown (12 of 12)]
[im 1/1]
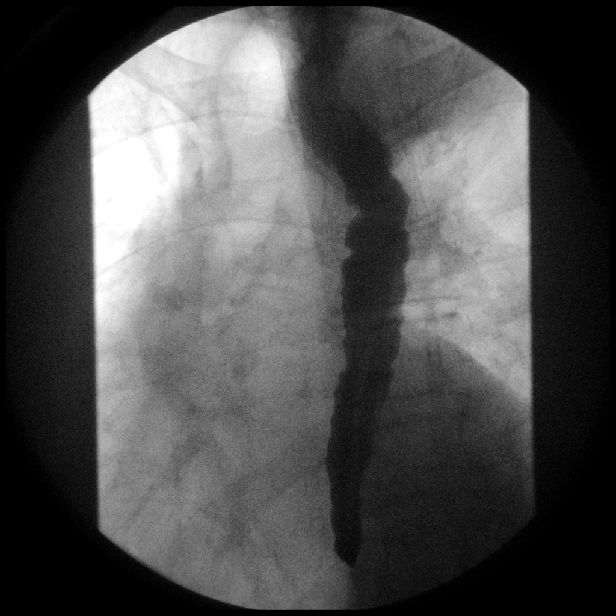

[12 of 12 positions shown; findings below may reference images not displayed]

FINDINGS: Esophageal dysmotility with frequent tertiary contractions,
esophageal spasm and esophageal stasis. No intrinsic or extrinsic
mass lesions were identified. No hiatal hernia or GE reflux. A 13 mm
barium pill passed into the stomach without difficulty.
IMPRESSION: Esophageal dysmotility with frequent tertiary contractions,
esophageal spasm and esophageal stasis.

No hiatal hernia or GE reflux.

No mass or stricture.

## 2015-01-04 IMAGING — CR DG LUMBAR SPINE 1V
1 series · 1 of 1 positions shown · non-contrast
Comparison: None.

CLINICAL DATA: L2 fracture, posterior spinal fusion

EXAM:
LUMBAR SPINE - 1 VIEW

[lat]
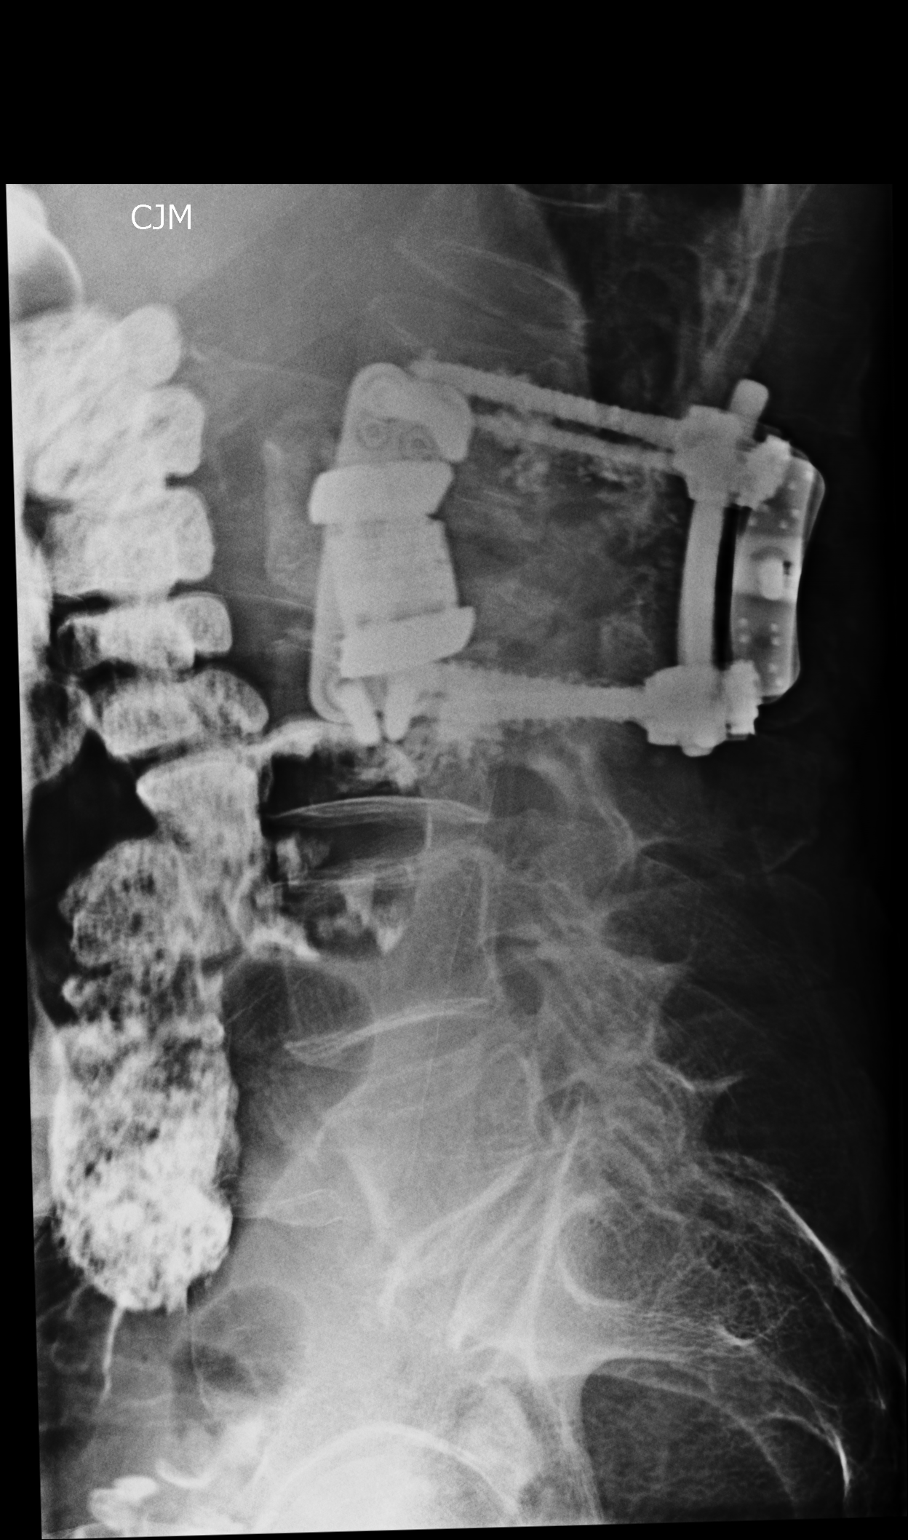

[1 of 1 positions shown; findings below may reference images not displayed]

FINDINGS: Single intraoperative fluoroscopic spot image of the lumbar spine
provided. Posterior spinal fusion transfixing the L1 and L3
vertebral levels. Prior L2 vertebrectomy. No hardware failure or
complication.
IMPRESSION: Interval posterior spinal fusion transfixing the L1 and L3 posterior
elements. Prior L2 vertebrectomy.

## 2015-03-26 ENCOUNTER — Ambulatory Visit: Payer: Medicare Other | Admitting: Internal Medicine

## 2018-07-10 DEATH — deceased
